# Patient Record
Sex: Male | Born: 1950 | Race: White | Hispanic: Yes | Smoking: Former smoker
Health system: Southern US, Community
[De-identification: ages and names within clinical notes are randomized; demographics above are authoritative.]

## PROBLEM LIST (undated history)

## (undated) DIAGNOSIS — I10 Essential (primary) hypertension: Secondary | ICD-10-CM

## (undated) DIAGNOSIS — M48061 Spinal stenosis, lumbar region without neurogenic claudication: Secondary | ICD-10-CM

## (undated) DIAGNOSIS — M5116 Intervertebral disc disorders with radiculopathy, lumbar region: Secondary | ICD-10-CM

## (undated) DIAGNOSIS — I639 Cerebral infarction, unspecified: Secondary | ICD-10-CM

## (undated) DIAGNOSIS — J449 Chronic obstructive pulmonary disease, unspecified: Secondary | ICD-10-CM

## (undated) DIAGNOSIS — M810 Age-related osteoporosis without current pathological fracture: Secondary | ICD-10-CM

## (undated) DIAGNOSIS — I2510 Atherosclerotic heart disease of native coronary artery without angina pectoris: Secondary | ICD-10-CM

## (undated) DIAGNOSIS — J4489 Other specified chronic obstructive pulmonary disease: Secondary | ICD-10-CM

## (undated) DIAGNOSIS — E785 Hyperlipidemia, unspecified: Secondary | ICD-10-CM

## (undated) HISTORY — DX: Chronic obstructive pulmonary disease, unspecified: J44.9

## (undated) HISTORY — DX: Cerebral infarction, unspecified: I63.9

---

## 1996-06-27 HISTORY — PX: INGUINAL HERNIA REPAIR: SUR1180

## 2003-06-28 HISTORY — PX: CORONARY ARTERY BYPASS GRAFT: SHX141

## 2017-06-27 HISTORY — PX: LUMBAR DISC SURGERY: SHX700

## 2019-06-28 HISTORY — PX: CATARACT EXTRACTION, BILATERAL: SHX1313

## 2019-09-12 ENCOUNTER — Ambulatory Visit: Payer: Medicare (Managed Care) | Attending: Internal Medicine

## 2019-09-12 DIAGNOSIS — Z23 Encounter for immunization: Secondary | ICD-10-CM

## 2019-09-12 NOTE — Progress Notes (Signed)
   Covid-19 Vaccination Clinic  Name:  Joncarlos Atkison    MRN: [id number] DOB: [birth year]  09/12/2019  Mr. Sapp was observed post Covid-19 immunization for 15 minutes without incident. He was provided with Vaccine Information Sheet and instruction to access the V-Safe system.   Mr. Kille was instructed to call 911 with any severe reactions post vaccine: Marland Kitchen Difficulty breathing  . Swelling of face and throat  . A fast heartbeat  . A bad rash all over body  . Dizziness and weakness   Immunizations Administered    Name Date Dose VIS Date Route   Moderna COVID-19 Vaccine 09/12/2019 11:46 AM 0.5 mL 05/28/2019 Intramuscular   Manufacturer: Moderna   Lot: [id number]   NDC: 70964-383-81

## 2019-10-15 ENCOUNTER — Ambulatory Visit: Payer: Medicare (Managed Care) | Attending: Family

## 2019-10-15 DIAGNOSIS — Z23 Encounter for immunization: Secondary | ICD-10-CM

## 2019-10-15 NOTE — Progress Notes (Signed)
   Covid-19 Vaccination Clinic  Name:  Brandon Daniel    MRN: [id number] DOB: 05-18-1951  10/15/2019  [name] was observed post Covid-19 immunization for 15 minutes without incident. He was provided with Vaccine Information Sheet and instruction to access the V-Safe system.   [name] was instructed to call 911 with any severe reactions post vaccine: Marland Kitchen Difficulty breathing  . Swelling of face and throat  . A fast heartbeat  . A bad rash all over body  . Dizziness and weakness   Immunizations Administered    Name Date Dose VIS Date Route   Moderna COVID-19 Vaccine 10/15/2019 11:16 AM 0.5 mL 05/2019 Intramuscular   Manufacturer: Moderna   Lot: [id number]   NDC: 58251-898-42

## 2020-10-20 ENCOUNTER — Observation Stay (HOSPITAL_COMMUNITY)
Admission: EM | Admit: 2020-10-20 | Discharge: 2020-10-21 | Disposition: A | Discharge status: Home / Self Care | Payer: Medicare (Managed Care) | Attending: Internal Medicine | Admitting: Internal Medicine

## 2020-10-20 ENCOUNTER — Observation Stay (HOSPITAL_COMMUNITY): Payer: Medicare (Managed Care)

## 2020-10-20 ENCOUNTER — Emergency Department (HOSPITAL_COMMUNITY): Payer: Medicare (Managed Care)

## 2020-10-20 ENCOUNTER — Encounter (HOSPITAL_COMMUNITY): Payer: Self-pay

## 2020-10-20 ENCOUNTER — Other Ambulatory Visit: Payer: Self-pay

## 2020-10-20 DIAGNOSIS — Z951 Presence of aortocoronary bypass graft: Secondary | ICD-10-CM | POA: Insufficient documentation

## 2020-10-20 DIAGNOSIS — M5116 Intervertebral disc disorders with radiculopathy, lumbar region: Secondary | ICD-10-CM | POA: Insufficient documentation

## 2020-10-20 DIAGNOSIS — Z20822 Contact with and (suspected) exposure to covid-19: Secondary | ICD-10-CM | POA: Insufficient documentation

## 2020-10-20 DIAGNOSIS — I10 Essential (primary) hypertension: Secondary | ICD-10-CM | POA: Insufficient documentation

## 2020-10-20 DIAGNOSIS — Z79899 Other long term (current) drug therapy: Secondary | ICD-10-CM | POA: Diagnosis not present

## 2020-10-20 DIAGNOSIS — Z7982 Long term (current) use of aspirin: Secondary | ICD-10-CM | POA: Diagnosis not present

## 2020-10-20 DIAGNOSIS — M5127 Other intervertebral disc displacement, lumbosacral region: Secondary | ICD-10-CM

## 2020-10-20 DIAGNOSIS — J45909 Unspecified asthma, uncomplicated: Secondary | ICD-10-CM | POA: Insufficient documentation

## 2020-10-20 DIAGNOSIS — Z87891 Personal history of nicotine dependence: Secondary | ICD-10-CM | POA: Diagnosis not present

## 2020-10-20 DIAGNOSIS — R0602 Shortness of breath: Secondary | ICD-10-CM | POA: Diagnosis not present

## 2020-10-20 DIAGNOSIS — R778 Other specified abnormalities of plasma proteins: Secondary | ICD-10-CM

## 2020-10-20 DIAGNOSIS — I25118 Atherosclerotic heart disease of native coronary artery with other forms of angina pectoris: Principal | ICD-10-CM | POA: Insufficient documentation

## 2020-10-20 DIAGNOSIS — R42 Dizziness and giddiness: Secondary | ICD-10-CM | POA: Diagnosis present

## 2020-10-20 DIAGNOSIS — J449 Chronic obstructive pulmonary disease, unspecified: Secondary | ICD-10-CM | POA: Diagnosis not present

## 2020-10-20 DIAGNOSIS — R079 Chest pain, unspecified: Secondary | ICD-10-CM | POA: Diagnosis present

## 2020-10-20 HISTORY — DX: Intervertebral disc disorders with radiculopathy, lumbar region: M51.16

## 2020-10-20 HISTORY — DX: Spinal stenosis, lumbar region without neurogenic claudication: M48.061

## 2020-10-20 HISTORY — DX: Chronic obstructive pulmonary disease, unspecified: J44.9

## 2020-10-20 HISTORY — DX: Other specified chronic obstructive pulmonary disease: J44.89

## 2020-10-20 HISTORY — DX: Essential (primary) hypertension: I10

## 2020-10-20 HISTORY — DX: Atherosclerotic heart disease of native coronary artery without angina pectoris: I25.10

## 2020-10-20 HISTORY — DX: Hyperlipidemia, unspecified: E78.5

## 2020-10-20 HISTORY — DX: Age-related osteoporosis without current pathological fracture: M81.0

## 2020-10-20 LAB — CBC WITH DIFFERENTIAL/PLATELET
Abs Immature Granulocytes: 0.05 10*3/uL (ref 0.00–0.07)
Basophils Absolute: 0.1 10*3/uL (ref 0.0–0.1)
Basophils Relative: 1 %
Eosinophils Absolute: 0.1 10*3/uL (ref 0.0–0.5)
Eosinophils Relative: 3 %
HCT: 37.5 % — ABNORMAL LOW (ref 39.0–52.0)
Hemoglobin: 12.2 g/dL — ABNORMAL LOW (ref 13.0–17.0)
Immature Granulocytes: 1 %
Lymphocytes Relative: 23 %
Lymphs Abs: 1.2 10*3/uL (ref 0.7–4.0)
MCH: 30.4 pg (ref 26.0–34.0)
MCHC: 32.5 g/dL (ref 30.0–36.0)
MCV: 93.5 fL (ref 80.0–100.0)
Monocytes Absolute: 0.6 10*3/uL (ref 0.1–1.0)
Monocytes Relative: 12 %
Neutro Abs: 3.1 10*3/uL (ref 1.7–7.7)
Neutrophils Relative %: 60 %
Platelets: 153 10*3/uL (ref 150–400)
RBC: 4.01 MIL/uL — ABNORMAL LOW (ref 4.22–5.81)
RDW: 12.2 % (ref 11.5–15.5)
WBC: 5.2 10*3/uL (ref 4.0–10.5)
nRBC: 0 % (ref 0.0–0.2)

## 2020-10-20 LAB — BASIC METABOLIC PANEL
Anion gap: 7 (ref 5–15)
BUN: 12 mg/dL (ref 8–23)
CO2: 28 mmol/L (ref 22–32)
Calcium: 9 mg/dL (ref 8.9–10.3)
Chloride: 100 mmol/L (ref 98–111)
Creatinine, Ser: 0.83 mg/dL (ref 0.61–1.24)
GFR, Estimated: >60 mL/min (ref >60)
Glucose, Bld: 131 mg/dL — ABNORMAL HIGH (ref 70–99)
Potassium: 4.1 mmol/L (ref 3.5–5.1)
Sodium: 135 mmol/L (ref 135–145)

## 2020-10-20 LAB — TROPONIN I (HIGH SENSITIVITY)
Troponin I (High Sensitivity): 9 ng/L (ref <18)
Troponin I (High Sensitivity): 17 ng/L (ref <18)
Troponin I (High Sensitivity): 24 ng/L — ABNORMAL HIGH (ref <18)
Troponin I (High Sensitivity): 26 ng/L — ABNORMAL HIGH (ref <18)

## 2020-10-20 LAB — HIV ANTIBODY (ROUTINE TESTING W REFLEX): HIV Screen 4th Generation wRfx: NONREACTIVE

## 2020-10-20 LAB — BRAIN NATRIURETIC PEPTIDE: B Natriuretic Peptide: 137.5 pg/mL — ABNORMAL HIGH (ref 0.0–100.0)

## 2020-10-20 LAB — HEMOGLOBIN A1C
Hgb A1c MFr Bld: 5.5 % (ref 4.8–5.6)
Mean Plasma Glucose: 111.15 mg/dL

## 2020-10-20 LAB — HEPARIN LEVEL (UNFRACTIONATED): Heparin Unfractionated: 0.11 IU/mL — ABNORMAL LOW (ref 0.30–0.70)

## 2020-10-20 LAB — SARS CORONAVIRUS 2 (TAT 6-24 HRS): SARS Coronavirus 2: NEGATIVE

## 2020-10-20 LAB — MAGNESIUM: Magnesium: 2.1 mg/dL (ref 1.7–2.4)

## 2020-10-20 MED ORDER — HYDROCHLOROTHIAZIDE 25 MG PO TABS
25.0000 mg | ORAL_TABLET | Freq: Every day | ORAL | Status: DC
  Administered 2020-10-21: 25 mg via ORAL
  Filled 2020-10-20: qty 1

## 2020-10-20 MED ORDER — ALBUTEROL SULFATE HFA 108 (90 BASE) MCG/ACT IN AERS
1.0000 | INHALATION_SPRAY | Freq: Four times a day (QID) | RESPIRATORY_TRACT | Status: DC | PRN
  Filled 2020-10-20: qty 6.7

## 2020-10-20 MED ORDER — CLOPIDOGREL BISULFATE 75 MG PO TABS
75.0000 mg | ORAL_TABLET | Freq: Every day | ORAL | Status: DC
  Administered 2020-10-21: 75 mg via ORAL
  Filled 2020-10-20: qty 1

## 2020-10-20 MED ORDER — GADOBUTROL 1 MMOL/ML IV SOLN
10.0000 mL | Freq: Once | INTRAVENOUS | Status: AC | PRN
  Administered 2020-10-20: 10 mL via INTRAVENOUS

## 2020-10-20 MED ORDER — ATENOLOL 25 MG PO TABS: 100.0000 mg | ORAL_TABLET | Freq: Every day | ORAL | Status: DC

## 2020-10-20 MED ORDER — HEPARIN BOLUS VIA INFUSION
4000.0000 [IU] | Freq: Once | INTRAVENOUS | Status: AC
  Administered 2020-10-20: 4000 [IU] via INTRAVENOUS
  Filled 2020-10-20: qty 4000

## 2020-10-20 MED ORDER — POLYETHYLENE GLYCOL 3350 17 G PO PACK: 17.0000 g | PACK | Freq: Every day | ORAL | Status: DC | PRN

## 2020-10-20 MED ORDER — NITROGLYCERIN IN D5W 200-5 MCG/ML-% IV SOLN
0.0000 ug/min | INTRAVENOUS | Status: DC
  Administered 2020-10-20: 5 ug/min via INTRAVENOUS
  Filled 2020-10-20: qty 250

## 2020-10-20 MED ORDER — MONTELUKAST SODIUM 10 MG PO TABS
10.0000 mg | ORAL_TABLET | Freq: Every day | ORAL | Status: DC
  Administered 2020-10-20: 10 mg via ORAL
  Filled 2020-10-20 (×2): qty 1

## 2020-10-20 MED ORDER — ASPIRIN EC 81 MG PO TBEC
81.0000 mg | DELAYED_RELEASE_TABLET | Freq: Every day | ORAL | Status: DC
  Administered 2020-10-21: 81 mg via ORAL
  Filled 2020-10-20: qty 1

## 2020-10-20 MED ORDER — EZETIMIBE 10 MG PO TABS
10.0000 mg | ORAL_TABLET | Freq: Every day | ORAL | Status: DC
  Administered 2020-10-21: 10 mg via ORAL
  Filled 2020-10-20: qty 1

## 2020-10-20 MED ORDER — ACETAMINOPHEN 325 MG PO TABS: 650.0000 mg | ORAL_TABLET | Freq: Four times a day (QID) | ORAL | Status: DC | PRN

## 2020-10-20 MED ORDER — HEPARIN BOLUS VIA INFUSION
2000.0000 [IU] | Freq: Once | INTRAVENOUS | Status: AC
  Administered 2020-10-20: 2000 [IU] via INTRAVENOUS
  Filled 2020-10-20: qty 2000

## 2020-10-20 MED ORDER — LISINOPRIL 20 MG PO TABS
20.0000 mg | ORAL_TABLET | Freq: Every day | ORAL | Status: DC
  Administered 2020-10-21: 20 mg via ORAL
  Filled 2020-10-20: qty 1

## 2020-10-20 MED ORDER — GABAPENTIN 300 MG PO CAPS
600.0000 mg | ORAL_CAPSULE | Freq: Two times a day (BID) | ORAL | Status: DC
  Administered 2020-10-20 – 2020-10-21 (×2): 600 mg via ORAL
  Filled 2020-10-20 (×2): qty 2

## 2020-10-20 MED ORDER — ACETAMINOPHEN 650 MG RE SUPP
650.0000 mg | Freq: Four times a day (QID) | RECTAL | Status: DC | PRN
Start: 2020-10-20 — End: 2020-10-21

## 2020-10-20 MED ORDER — HEPARIN (PORCINE) 25000 UT/250ML-% IV SOLN
1500.0000 [IU]/h | INTRAVENOUS | Status: DC
  Administered 2020-10-20 (×2): 1200 [IU]/h via INTRAVENOUS
  Administered 2020-10-21: 1500 [IU]/h via INTRAVENOUS
  Filled 2020-10-20 (×2): qty 250

## 2020-10-20 MED ORDER — ATORVASTATIN CALCIUM 80 MG PO TABS
80.0000 mg | ORAL_TABLET | Freq: Every day | ORAL | Status: DC
  Administered 2020-10-21: 80 mg via ORAL
  Filled 2020-10-20: qty 1

## 2020-10-20 NOTE — ED Notes (Signed)
Patient transported to MRI 

## 2020-10-20 NOTE — ED Notes (Signed)
Patient denies pain and is resting comfortably.  

## 2020-10-20 NOTE — Progress Notes (Signed)
ANTICOAGULATION CONSULT NOTE  Pharmacy Consult for heparin Indication: chest pain/ACS  No Known Allergies  Patient Measurements: Height: 5\' 9"  (175.3 cm) Weight: 104.3 kg (230 lb) IBW/kg (Calculated) : 70.7 Heparin Dosing Weight: 93.2 kg  Vital Signs: Temp: 98.4 F (36.9 C) (04/26 2059) Temp Source: Oral (04/26 2059) BP: 148/65 (04/26 2059) Pulse Rate: 51 (04/26 1700)  Labs: Recent Labs    10/20/20 0924 10/20/20 1057 10/20/20 1220 10/20/20 1416 10/20/20 2211  HGB 12.2*  --   --   --   --   HCT 37.5*  --   --   --   --   PLT 153  --   --   --   --   HEPARINUNFRC  --   --   --   --  0.11*  CREATININE 0.83  --   --   --   --   TROPONINIHS 9 17 24* 26*  --     Estimated Creatinine Clearance: 102.7 mL/min (by C-G formula based on SCr of 0.83 mg/dL).  Assessment: 70 y.o. male with chest pain for heparin   Goal of Therapy:  Heparin level 0.3-0.7 units/ml Monitor platelets by anticoagulation protocol: Yes   Plan:  Heparin 2000 units IV bolus, then increase heparin  1500 units/hr Follow-up am labs.  79, PharmD, BCPS

## 2020-10-20 NOTE — ED Notes (Signed)
Patient transported to CT in stable condition.

## 2020-10-20 NOTE — ED Notes (Addendum)
Nurse report given to floor nurse at this time, Charisse, Charity fundraiser. Patient will go to MRI then bed 6E19.

## 2020-10-20 NOTE — ED Triage Notes (Signed)
Pt stated he was walking yesterday and his legs felt weak and dizzy. Pt stated that he did collapse. He had blurred vision. Today he started having chest pain and dizziness.

## 2020-10-20 NOTE — Consult Note (Signed)
Reason for Consult: Mild chest pain associated with shortness of breath and diaphoresis and minimally elevated high-sensitivity troponin I Referring Physician: Teaching service  Brandon Daniel is an 70 y.o. male.  HPI: Patient is a 70 year old male with past medical history significant for coronary artery disease status post CABG x2 in 2005 in [place] status post 18 stents post bypass as per patient, hypertension, hyperlipidemia, morbid obesity, spinal stenosis, peripheral neuropathy, came to ER earlier because of bilateral leg weakness while walking in [employer] yesterday associated dizziness, did not seek medical attention yesterday but as symptoms got worse so decided to drove to ED.  While walking from parking lot to ED patient developed questionable chest pain and mild shortness of breath and was noted to be diaphoretic EKG done in the ED showed normal sinus rhythm with nondiagnostic Q waves in inferior leads no acute ischemic changes are noted 2 sets of high-sensitivity troponin I were negative and then next 2 sets very minimally elevated.  Patient denies any exertional chest pain although his activity is limited due to his back issues.  Denies palpitation lightheadedness or syncope.  Denies PND orthopnea leg swelling.  States has not used any nitro since last 3 to 4 years.  Had nuclear stress test approximately 3 to 4 years ago in [place] which was negative results not available.  States he had open heart surgery x2 in 2005 subsequently had 18 stents in his heart.  States he is visiting his sister and is planning to go to [place] in the next 2 weeks.  States heart wise he is doing okay he is worried about his back.    History reviewed. No pertinent past medical history.   No family history on file.  Social History:  has no history on file for tobacco use, alcohol use, and drug use.  Allergies: No Known Allergies  Medications: I have reviewed the patient's current  medications.  Results for orders placed or performed during the hospital encounter of 10/20/20 (from the past 48 hour(s))  Brain natriuretic peptide     Status: Abnormal   Collection Time: 10/20/20  9:19 AM  Result Value Ref Range   B Natriuretic Peptide 137.5 (H) 0.0 - 100.0 pg/mL    Comment: Performed at [facility], [address], [place], [place] [address]  Troponin I (High Sensitivity)     Status: None   Collection Time: 10/20/20  9:24 AM  Result Value Ref Range   Troponin I (High Sensitivity) 9 <18 ng/L    Comment: (NOTE) Elevated high sensitivity troponin I (hsTnI) values and significant  changes across serial measurements may suggest ACS but many other  chronic and acute conditions are known to elevate hsTnI results.  Refer to the "Links" section for chest pain algorithms and additional  guidance. Performed at [facility], [address] [place], [place] [address]   Basic metabolic panel     Status: Abnormal   Collection Time: 10/20/20  9:24 AM  Result Value Ref Range   Sodium 135 135 - 145 mmol/L   Potassium 4.1 3.5 - 5.1 mmol/L   Chloride 100 98 - 111 mmol/L   CO2 28 22 - 32 mmol/L   Glucose, Bld 131 (H) 70 - 99 mg/dL    Comment: Glucose reference range applies only to samples taken after fasting for at least 8 hours.   BUN 12 8 - 23 mg/dL   Creatinine, Ser 5.32 0.61 - 1.24 mg/dL   Calcium  9.0 8.9 - 10.3 mg/dL   GFR, Estimated >41 >93 mL/min    Comment: (NOTE) Calculated using the CKD-EPI Creatinine Equation (2021)    Anion gap 7 5 - 15    Comment: Performed at [facility], [address] [place], [place] [address]  CBC with Differential     Status: Abnormal   Collection Time: 10/20/20  9:24 AM  Result Value Ref Range   WBC 5.2 4.0 - 10.5 K/uL   RBC 4.01 (L) 4.22 - 5.81 MIL/uL   Hemoglobin 12.2 (L) 13.0 - 17.0 g/dL   HCT 09.7 (L) 35.3 - 29.9 %   MCV 93.5 80.0 - 100.0 fL   MCH 30.4 26.0 - 34.0 pg   MCHC 32.5 30.0 - 36.0 g/dL   RDW 24.2  68.3 - 41.9 %   Platelets 153 150 - 400 K/uL   nRBC 0.0 0.0 - 0.2 %   Neutrophils Relative % 60 %   Neutro Abs 3.1 1.7 - 7.7 K/uL   Lymphocytes Relative 23 %   Lymphs Abs 1.2 0.7 - 4.0 K/uL   Monocytes Relative 12 %   Monocytes Absolute 0.6 0.1 - 1.0 K/uL   Eosinophils Relative 3 %   Eosinophils Absolute 0.1 0.0 - 0.5 K/uL   Basophils Relative 1 %   Basophils Absolute 0.1 0.0 - 0.1 K/uL   Immature Granulocytes 1 %   Abs Immature Granulocytes 0.05 0.00 - 0.07 K/uL    Comment: Performed at [facility], [address] [place], [place] [address]  Magnesium     Status: None   Collection Time: 10/20/20  9:24 AM  Result Value Ref Range   Magnesium 2.1 1.7 - 2.4 mg/dL    Comment: Performed at [facility], [address], [place], [place] [address]  Troponin I (High Sensitivity)     Status: None   Collection Time: 10/20/20 10:57 AM  Result Value Ref Range   Troponin I (High Sensitivity) 17 <18 ng/L    Comment: (NOTE) Elevated high sensitivity troponin I (hsTnI) values and significant  changes across serial measurements may suggest ACS but many other  chronic and acute conditions are known to elevate hsTnI results.  Refer to the "Links" section for chest pain algorithms and additional  guidance. Performed at [facility], [address] [place], [place] [address]   Troponin I (High Sensitivity)     Status: Abnormal   Collection Time: 10/20/20 12:20 PM  Result Value Ref Range   Troponin I (High Sensitivity) 24 (H) <18 ng/L    Comment: (NOTE) Elevated high sensitivity troponin I (hsTnI) values and significant  changes across serial measurements may suggest ACS but many other  chronic and acute conditions are known to elevate hsTnI results.  Refer to the "Links" section for chest pain algorithms and additional  guidance. Performed at [facility], [address], [place], [place] [address]   Troponin I (High Sensitivity)     Status: Abnormal   Collection  Time: 10/20/20  2:16 PM  Result Value Ref Range   Troponin I (High Sensitivity) 26 (H) <18 ng/L    Comment: (NOTE) Elevated high sensitivity troponin I (hsTnI) values and significant  changes across serial measurements may suggest ACS but many other  chronic and acute conditions are known to elevate hsTnI results.  Refer to the "Links" section for chest pain algorithms and additional  guidance. Performed at [facility], [address],  [place], [place] [address]     CT Head Wo Contrast  Result Date: 10/20/2020 CLINICAL DATA:  Dizziness, nonspecific. Additional history provided: Patient reports leg weakness and dizziness while walking yesterday with collapse, blurred vision, chest pain and dizziness today. EXAM: CT HEAD WITHOUT CONTRAST TECHNIQUE: Contiguous axial images were obtained from the base of the skull through the vertex without intravenous contrast. COMPARISON:  No pertinent prior exams available for comparison. FINDINGS: Brain: Mild generalized parenchymal atrophy. There is no acute intracranial hemorrhage. No demarcated cortical infarct. No extra-axial fluid collection. No evidence of intracranial mass. No midline shift. Vascular: No hyperdense vessel.  Atherosclerotic calcifications Skull: Normal. Negative for fracture or focal lesion. Sinuses/Orbits: Visualized orbits show no acute finding. Mild mucosal thickening within the inferior left frontal sinus. Moderate mucosal thickening within the bilateral ethmoid air cells. Fluid level and moderate mucosal thickening within the left sphenoid sinus with associated chronic reactive osteitis. Extensive partial opacification of the left maxillary sinus with associated chronic reactive osteitis. Mild mucosal thickening and small mucous retention cyst within the imaged right maxillary sinus. Other: 13 mm polypoid soft tissue focus within the posterior right nasal passage. IMPRESSION: No evidence of acute intracranial abnormality. Mild  generalized parenchymal atrophy. Fairly extensive paranasal sinus disease, as described. 13 mm focus of polypoid soft tissue within the posterior right nasal passage. Nonemergent ENT consultation should be considered. Electronically Signed   By: Jackey Loge DO   On: 10/20/2020 10:33   DG Chest Portable 1 View  Result Date: 10/20/2020 CLINICAL DATA:  Shortness of breath and chest pain EXAM: PORTABLE CHEST 1 VIEW COMPARISON:  None. FINDINGS: Blunting of the left costophrenic angle may be due to scarring. A small associated pleural effusion on the left cannot be excluded. Lungs otherwise are somewhat hyperexpanded. There are scattered areas of scarring in the lung bases and left mid lung. No edema or consolidation. Heart size and pulmonary vascularity are within normal limits. Patient is status post median sternotomy. There is aortic atherosclerosis. No bone lesions. IMPRESSION: Scarring lateral left base with questionable small associated pleural effusion on the left. Scattered areas of scarring elsewhere in the bases and left mid lung. No edema or airspace opacity. Underlying mild hyperexpansion. Heart size within normal limits. Postoperative changes noted. Aortic Atherosclerosis (ICD10-I70.0). Electronically Signed   By: Bretta Bang III M.D.   On: 10/20/2020 09:51    Review of Systems  Constitutional: Positive for diaphoresis. Negative for fatigue.  HENT: Negative for sore throat.   Eyes: Negative for pain.  Respiratory: Positive for shortness of breath.   Cardiovascular: Negative for palpitations.  Gastrointestinal: Negative for abdominal distention.  Genitourinary: Negative for difficulty urinating.  Neurological: Negative for dizziness.   Blood pressure 138/74, pulse (!) 51, temperature 98.3 F (36.8 C), temperature source Oral, resp. rate 18, height 5\' 9"  (1.753 m), weight 104.3 kg, SpO2 97 %. Physical Exam Constitutional:      Appearance: He is well-developed.  HENT:     Head:  Normocephalic and atraumatic.  Neck:     Vascular: No JVD.  Cardiovascular:     Rate and Rhythm: Normal rate and regular rhythm.     Heart sounds: Normal heart sounds. No S3 sounds.   Pulmonary:     Comments: Decreased breath sound at bases no rhonchi or rales  noted Abdominal:     General: Bowel sounds are normal. There is no abdominal bruit.     Palpations: Abdomen is soft.  Musculoskeletal:     Cervical back: Normal  range of motion and neck supple.     Right lower leg: No tenderness. No edema.     Left lower leg: No tenderness. No edema.  Skin:    General: Skin is warm and dry.  Neurological:     General: No focal deficit present.     Mental Status: He is alert and oriented to person, place, and time.     Assessment/Plan: Acute coronary syndrome with minimally elevated high-sensitivity troponins I doubt significant MI Coronary artery disease status post CABG x2 approximately 17 years ago status post multivessel PCI requiring 18 stents in [place]. Hypertension Hyperlipidemia Morbid obesity History of spinal stenosis Peripheral neuropathy Plan Continue home medications Check serial enzymes, lipid panel and EKG Check old records Home cardiac meds Aspirin 81 mg 2 tablets daily Clopidogrel 75 mg daily Atenolol 100 mg daily Lisinopril 20 mg daily Hydrochlorothiazide 25 mg daily Atorvastatin 80 mg daily Zetia 10 mg daily Nitrostat 0.4 mg sublingual as needed We will add isosorbide mononitrate 30 mg daily once off IV nitro Discussed with patient at length various options of treatment i.e. repeating nuclear stress test to further risk stratify and possible cardiac catheterization possible PTCA stenting, patient states heart wise he is doing well in fact he did not had any chest pain but was sweating while walking from the parking lot to the ER.  States has not used any nitro in last 3 to 4 years had nuclear stress test 3 to 4 years ago which was normal in [place].   States concerned about his back and planning to travel to [place] on May 7 and wanted to be treated medically for now.  Rinaldo CloudHarwani, Shaniece Bussa 10/20/2020, 5:16 PM

## 2020-10-20 NOTE — Progress Notes (Signed)
ANTICOAGULATION CONSULT NOTE - Initial Consult  Pharmacy Consult for heparin dosing. Indication: chest pain/ACS  Not on File  Patient Measurements: Height: 5\' 9"  (175.3 cm) Weight: 104.3 kg (230 lb) IBW/kg (Calculated) : 70.7 Heparin Dosing Weight: 93.2 kg  Vital Signs: Temp: 98.3 F (36.8 C) (04/26 0909) Temp Source: Oral (04/26 0909) BP: 142/69 (04/26 1400) Pulse Rate: 50 (04/26 1400)  Labs: Recent Labs    10/20/20 0924 10/20/20 1057 10/20/20 1220  HGB 12.2*  --   --   HCT 37.5*  --   --   PLT 153  --   --   CREATININE 0.83  --   --   TROPONINIHS 9 17 24*    Estimated Creatinine Clearance: 102.7 mL/min (by C-G formula based on SCr of 0.83 mg/dL).   Medical History: History reviewed. No pertinent past medical history.  Assessment: 70 y.o. male presenting with chest pain, SOB, and dizziness. Patient reports history of cardiac bypass surgery and multiple stent placements. Reportedly not on AC PTA. Initial CBC ok with Hgb 12.2, HCT 37.5, Pltc 153. Scr 0.83. Pharmacy has been consulted to dose IV heparin.   Goal of Therapy:  Heparin level 0.3-0.7 units/ml Monitor platelets by anticoagulation protocol: Yes   Plan:  Give 4000 units bolus x 1 Start heparin infusion at 1200 units/hr Check anti-Xa level in 6 hours and daily while on heparin Continue to monitor H&H and platelets  79, PharmD PGY1 Acute Care Pharmacy Resident 10/20/2020 2:17 PM  Please check [email or web address] for unit-specific pharmacy phone numbers.

## 2020-10-20 NOTE — ED Provider Notes (Signed)
[facility] EMERGENCY DEPARTMENT Provider Note   CSN: [id number] Arrival date & time: 10/20/20  0857     History Chief Complaint  Brandon Daniel presents with  . Chest Pain  . Dizziness    Brandon Daniel is a 70 y.o. male who presents with concern for episode of dizziness and sensation that Brandon Daniel legs were weak "like I did not have any bones in them" yesterday while Brandon Daniel was at the [employer] store.  Brandon Daniel states that during that time Brandon Daniel had blurry vision.  Brandon Daniel states that Brandon Daniel collapsed to the ground due to the leg weakness but denies any head trauma Kelsy, nausea, vomiting since that time.  Brandon Daniel denies any blurry vision at Brandon time but presented emerged department to undergo evaluation for sensation of leg weakness bilaterally.  Unfortunately when walking from Brandon Daniel vehicle into the emergency department, Brandon Daniel developed chest pain and mild shortness of breath.  At time of my initial exam Brandon Daniel is diaphoretic.  Brandon Daniel does endorse history of cardiac bypass surgery and multiple stent placements since that time.  Procedures done in Brandon Daniel home country after episode of shortness of breath with walking and subsequent failed stress test.  Brandon Daniel denies chest pain at Brandon time though Brandon Daniel remains diaphoretic.  Brandon Daniel reiterates that Brandon Daniel concern is not Brandon Daniel heart as Brandon Daniel "can live with that pain".  Brandon Daniel reiterates that Brandon Daniel priority is to be evaluated for the weakness Brandon Daniel is experiencing in Brandon Daniel legs as Brandon Daniel is scheduled to fly back to the [place] for a visit to Brandon Daniel family in 2 weeks.  Brandon Daniel endorses numbness and tingling in Brandon Daniel first second and third toes on the left foot as well as intermittent numbness and tingling in the left anterior thigh.  Brandon Daniel denies any saddle anesthesia, back pain, urinary incontinence or retention.  I personally reviewed Brandon Daniel's medical records.  Unfortunate there is very little information in Brandon Daniel EMR.  Brandon Daniel is able to provide insight that Brandon Daniel has extensive coronary artery disease  having undergone bypass and multiple stents placed.  Brandon Daniel has a medication list with him.  Brandon Daniel takes the following medications: Alendronate, 70 mg, Nitrostat as needed, atenolol 100 mg, aspirin 81 mg (takes 2 tablets daily), clopidogrel, acetamide, HCTZ, atorvastatin, gabapentin, lisinopril 20 mg, Stiolto (COPD).  Additionally Brandon Daniel endorses having history of lumbar spinal stenosis having undergone surgery.  Brandon Daniel states that the numbness, tingling, and weakness Brandon Daniel is experiencing was similar to how Brandon Daniel presented prior to Brandon Daniel prior back surgery.  Of note Brandon Daniel is visiting Brandon Daniel sister; Brandon Daniel lives in [place] and all of Brandon Daniel medical care is there.  HPI     History reviewed. No pertinent past medical history.  There are no problems to display for Brandon Daniel.  No family history on file.     Home Medications Prior to Admission medications   Medication Sig Start Date End Date Taking? Authorizing Provider  albuterol (VENTOLIN HFA) 108 (90 Base) MCG/ACT inhaler Inhale 1 puff into the lungs every 6 (six) hours as needed for wheezing or shortness of breath.   Yes [provider]  alendronate (FOSAMAX) 70 MG tablet Take 70 mg by mouth once a week. Take with a full glass of water on an empty stomach.   Yes [provider]  aspirin EC 81 MG tablet Take 81 mg by mouth in the morning and at bedtime. Swallow whole.   Yes [provider]  atenolol (TENORMIN) 100 MG tablet Take 100 mg by mouth  daily.   Yes [provider]  atorvastatin (LIPITOR) 80 MG tablet Take 80 mg by mouth daily.   Yes [provider]  clopidogrel (PLAVIX) 75 MG tablet Take 75 mg by mouth daily.   Yes [provider]  ezetimibe (ZETIA) 10 MG tablet Take 10 mg by mouth daily.   Yes [provider]  gabapentin (NEURONTIN) 600 MG tablet Take 600 mg by mouth 2 (two) times daily.   Yes [provider]  hydrochlorothiazide (HYDRODIURIL) 25 MG tablet Take 25 mg by mouth  daily.   Yes [provider]  lisinopril (ZESTRIL) 20 MG tablet Take 20 mg by mouth daily.   Yes [provider]  montelukast (SINGULAIR) 10 MG tablet Take 10 mg by mouth at bedtime.   Yes [provider]  nitroGLYCERIN (NITROSTAT) 0.4 MG SL tablet Place 0.4 mg under the tongue every 5 (five) minutes as needed for chest pain.   Yes [provider]  Polyvinyl Alcohol-Povidone (REFRESH OP) Place 1 drop into both eyes daily.   Yes [provider]  Tiotropium Bromide-Olodaterol (STIOLTO RESPIMAT) 2.5-2.5 MCG/ACT AERS Inhale 2 puffs into the lungs daily.   Yes [provider]    Allergies    Brandon Daniel has no known allergies.  Review of Systems   Review of Systems  Constitutional: Positive for diaphoresis. Negative for activity change, appetite change, chills, fatigue, fever and unexpected weight change.  HENT: Negative.   Eyes: Positive for visual disturbance.  Respiratory: Positive for shortness of breath. Negative for chest tightness.   Cardiovascular: Positive for chest pain. Negative for palpitations and leg swelling.  Gastrointestinal: Negative.   Genitourinary: Negative.   Musculoskeletal: Negative for back pain, neck pain and neck stiffness.  Skin: Negative.   Neurological: Positive for weakness and numbness. Negative for dizziness, syncope, facial asymmetry and headaches.    Physical Exam Updated Vital Signs BP (!) 167/74   Pulse (!) 38   Temp 98.3 F (36.8 C) (Oral)   Resp (!) 22   Ht 5\' 9"  (1.753 m)   Wt 104.3 kg   SpO2 100%   BMI 33.97 kg/m   Physical Exam Vitals and nursing note reviewed.  Constitutional:      Appearance: Brandon Daniel is obese. Brandon Daniel is diaphoretic. Brandon Daniel is not ill-appearing or toxic-appearing.  HENT:     Head: Normocephalic and atraumatic.     Nose: Nose normal.     Mouth/Throat:     Mouth: Mucous membranes are moist.     Pharynx: Oropharynx is clear. Uvula midline. No oropharyngeal exudate, posterior  oropharyngeal erythema or uvula swelling.     Tonsils: No tonsillar exudate.  Eyes:     General: Lids are normal. Vision grossly intact.        Right eye: No discharge.        Left eye: No discharge.     Extraocular Movements: Extraocular movements intact.     Conjunctiva/sclera: Conjunctivae normal.     Pupils: Pupils are equal, round, and reactive to light.  Neck:     Trachea: Trachea and phonation normal.  Cardiovascular:     Rate and Rhythm: Normal rate and regular rhythm.     Pulses: Normal pulses.     Heart sounds: Normal heart sounds. No murmur heard.   Pulmonary:     Effort: Pulmonary effort is normal. No tachypnea, bradypnea, accessory muscle usage or respiratory distress.     Breath sounds: Examination of the right-lower field reveals decreased breath sounds. Examination of  the left-lower field reveals decreased breath sounds. Decreased breath sounds present. No wheezing or rales.  Chest:     Chest wall: No mass, lacerations, deformity, swelling, tenderness, crepitus or edema.  Abdominal:     General: Abdomen is protuberant. Bowel sounds are normal. There is no distension.     Palpations: Abdomen is soft.     Tenderness: There is no abdominal tenderness. There is no right CVA tenderness, left CVA tenderness, guarding or rebound.     Hernia: A hernia is present. Hernia is present in the umbilical area.  Musculoskeletal:        General: No deformity.     Cervical back: Normal range of motion and neck supple. No edema, rigidity, tenderness, bony tenderness or crepitus. No pain with movement, spinous process tenderness or muscular tenderness.     Thoracic back: No tenderness or bony tenderness.     Lumbar back: No tenderness or bony tenderness.     Right lower leg: No edema.     Left lower leg: No edema.  Lymphadenopathy:     Cervical: No cervical adenopathy.  Skin:    General: Skin is warm.  Neurological:     Mental Status: Brandon Daniel is alert and oriented to person, place, and  time. Mental status is at baseline.     Cranial Nerves: Cranial nerves are intact.     Sensory: Sensation is intact.     Motor: Motor function is intact.     Coordination: Coordination is intact.     Gait: Gait is intact.  Psychiatric:        Mood and Affect: Mood normal.     ED Results / Procedures / Treatments   Labs (all labs ordered are listed, but only abnormal results are displayed) Labs Reviewed  BASIC METABOLIC PANEL - Abnormal; Notable for the following components:      Result Value   Glucose, Bld 131 (*)    All other components within normal limits  CBC WITH DIFFERENTIAL/PLATELET - Abnormal; Notable for the following components:   RBC 4.01 (*)    Hemoglobin 12.2 (*)    HCT 37.5 (*)    All other components within normal limits  BRAIN NATRIURETIC PEPTIDE - Abnormal; Notable for the following components:   B Natriuretic Peptide 137.5 (*)    All other components within normal limits  TROPONIN I (HIGH SENSITIVITY) - Abnormal; Notable for the following components:   Troponin I (High Sensitivity) 24 (*)    All other components within normal limits  MAGNESIUM  HEPARIN LEVEL (UNFRACTIONATED)  TROPONIN I (HIGH SENSITIVITY)  TROPONIN I (HIGH SENSITIVITY)  TROPONIN I (HIGH SENSITIVITY)    EKG EKG Interpretation  Date/Time:  Tuesday October 20 2020 09:10:43 EDT Ventricular Rate:  71 PR Interval:  155 QRS Duration: 101 QT Interval:  389 QTC Calculation: 423 R Axis:   65 Text Interpretation: Sinus rhythm nonspecific lateral T waves No old tracing to compare Confirmed by Pricilla LovelessGoldston, Scott [phone]([id number]) on 10/20/2020 9:14:01 AM   Radiology CT Head Wo Contrast  Result Date: 10/20/2020 CLINICAL DATA:  Dizziness, nonspecific. Additional history provided: Brandon Daniel reports leg weakness and dizziness while walking yesterday with collapse, blurred vision, chest pain and dizziness today. EXAM: CT HEAD WITHOUT CONTRAST TECHNIQUE: Contiguous axial images were obtained from the base of the  skull through the vertex without intravenous contrast. COMPARISON:  No pertinent prior exams available for comparison. FINDINGS: Brain: Mild generalized parenchymal atrophy. There is no acute intracranial hemorrhage. No demarcated cortical infarct. No extra-axial  fluid collection. No evidence of intracranial mass. No midline shift. Vascular: No hyperdense vessel.  Atherosclerotic calcifications Skull: Normal. Negative for fracture or focal lesion. Sinuses/Orbits: Visualized orbits show no acute finding. Mild mucosal thickening within the inferior left frontal sinus. Moderate mucosal thickening within the bilateral ethmoid air cells. Fluid level and moderate mucosal thickening within the left sphenoid sinus with associated chronic reactive osteitis. Extensive partial opacification of the left maxillary sinus with associated chronic reactive osteitis. Mild mucosal thickening and small mucous retention cyst within the imaged right maxillary sinus. Other: 13 mm polypoid soft tissue focus within the posterior right nasal passage. IMPRESSION: No evidence of acute intracranial abnormality. Mild generalized parenchymal atrophy. Fairly extensive paranasal sinus disease, as described. 13 mm focus of polypoid soft tissue within the posterior right nasal passage. Nonemergent ENT consultation should be considered. Electronically Signed   By: Jackey Loge DO   On: 10/20/2020 10:33   DG Chest Portable 1 View  Result Date: 10/20/2020 CLINICAL DATA:  Shortness of breath and chest pain EXAM: PORTABLE CHEST 1 VIEW COMPARISON:  None. FINDINGS: Blunting of the left costophrenic angle may be due to scarring. A small associated pleural effusion on the left cannot be excluded. Lungs otherwise are somewhat hyperexpanded. There are scattered areas of scarring in the lung bases and left mid lung. No edema or consolidation. Heart size and pulmonary vascularity are within normal limits. Brandon Daniel is status post median sternotomy. There is  aortic atherosclerosis. No bone lesions. IMPRESSION: Scarring lateral left base with questionable small associated pleural effusion on the left. Scattered areas of scarring elsewhere in the bases and left mid lung. No edema or airspace opacity. Underlying mild hyperexpansion. Heart size within normal limits. Postoperative changes noted. Aortic Atherosclerosis (ICD10-I70.0). Electronically Signed   By: Bretta Bang III M.D.   On: 10/20/2020 09:51    Procedures .Critical Care Performed by: Paris Lore, PA-C Authorized by: Paris Lore, PA-C   Critical care provider statement:    Critical care time (minutes):  45   Critical care was necessary to treat or prevent imminent or life-threatening deterioration of the following conditions: ACS.   Critical care was time spent personally by me on the following activities:  Discussions with consultants, evaluation of Brandon Daniel's response to treatment, examination of Brandon Daniel, ordering and performing treatments and interventions, ordering and review of laboratory studies, ordering and review of radiographic studies, pulse oximetry, re-evaluation of Brandon Daniel's condition, obtaining history from Brandon Daniel or surrogate and review of old charts    Medications Ordered in ED Medications  nitroGLYCERIN 50 mg in dextrose 5 % 250 mL (0.2 mg/mL) infusion (5 mcg/min Intravenous New Bag/Given 10/20/20 1420)  heparin ADULT infusion 100 units/mL (25000 units/271mL) (1,200 Units/hr Intravenous New Bag/Given 10/20/20 1430)    And  heparin bolus via infusion 4,000 Units (4,000 Units Intravenous Bolus from Bag 10/20/20 1430)    ED Course  I have reviewed the triage vital signs and the nursing notes.  Pertinent labs & imaging results that were available during my care of the Brandon Daniel were reviewed by me and considered in my medical decision making (see chart for details).  Clinical Course as of 10/20/20 1528  Tue Oct 20, 2020  1401 Consult to cardiologist,  Dr. Sharyn Lull for upward trending troponins.  Brandon Daniel recommends starting IV nitro and IV heparin, and medical admission to the hospital.  I appreciate Brandon Daniel collaboration in the care of Brandon Daniel.  Brandon Daniel will continue to consult on the inpatient service. [RS]  1425  Consult to internal medicine service, Dr. Huel Cote, was agreeable to seeing Brandon Daniel and admitting him to her service.  I appreciate her collaboration in the care Brandon Daniel. [RS]    Clinical Course User Index [RS] Jerson Furukawa, Idelia Salm   MDM Rules/Calculators/A&P                          70 year old male presents with concern for bilateral lower extremity weakness and episode of dizziness with blurry vision yesterday.  Additionally episode of chest pain and shortness of breath when ambulating into the emergency department, diaphoretic at Brandon time.  Concern for ACS given extensive cardiac history.  Differential diagnosis also includes COPD, pleural effusion, pneumonia, PE, pulmonary hypertension.  In regards to Brandon Daniel's low back pain, the emergent differential diagnosis for low back pain includes acute ligamentous and muscular injury, cord compression syndrome, pathologic fracture, transverse myelitis, vertebral osteomyelitis, discitis, and epidural abscess.  Hypertensive on intake.  Vital signs are normal.  Brandon Daniel is diaphoretic at time of my initial exam.  Cardiac exam is unremarkable, pulmonary exam with decreased breath sounds on lung bases bilaterally.  Abdominal exam revealed umbilical hernia, without evidence of incarceration.  Neurologic exam is without focal deficit.  There is symmetric strength in the lower extremities bilaterally.  Normal coordination.  There is altered sensation in the first through third digits of the left foot, however Brandon Daniel states that Brandon is persistent since Brandon Daniel prior back surgery.  Will prioritize cardiac work-up as well as CT of the head given episode of dizziness yesterday.  No indication for  emergent MRI as Brandon Daniel has normal neurologic exam and is ambulatory without difficulty in the emergency department.  Consult to cardiologist recommends starting IV heparin IV nitro; Brandon Daniel recommends medical admission.  Internal medicine consulted and will accept Brandon Daniel to their service.  Gerad voiced understanding of Brandon Daniel medical evaluation and treatment plan.  Issues questions was answered to Brandon Daniel expressed satisfaction.  Brandon Daniel is amenable to plan for admission at Brandon time.  Brandon chart was dictated using voice recognition software, Dragon. Despite the best efforts of Brandon provider to proofread and correct errors, errors may still occur which can change documentation meaning.  Final Clinical Impression(s) / ED Diagnoses Final diagnoses:  None    Rx / DC Orders ED Discharge Orders    None       Sherrilee Gilles 10/20/20 1528    Pricilla Loveless, MD 10/21/20 [phone]

## 2020-10-20 NOTE — H&P (Addendum)
Date: 10/20/2020               Patient Name:  Brandon Daniel MRN: [id number]  DOB: October 13, 1950 Age / Sex: 70 y.o., male   PCP: Pcp, No         Medical Service: Internal Medicine Teaching Service         Attending Physician: Dr. Reymundo Poll, MD    First Contact: Dr. Imogene Burn Pager: [phone]  Second Contact: Dr. Huel Cote Pager: [phone]       After Hours (After 5p/  First Contact Pager: [phone]  weekends / holidays): Second Contact Pager: [phone]   Chief Complaint: Left leg weakness, chest pain  History of Present Illness:   Brandon Daniel is a 70 y.o. male with hx of CAD s/p CABG in 2005 and multiple stents, stable angina, hypertension, hyperlipidemia, osteoporosis, lumbar stenosis and disc herniation with radiculopathy status post endoscopic discectomy, asthma COPD overlap syndrome presenting for left leg weakness. Brandon Daniel states he has been experiencing left leg weakness and areas of numbness for some time and it has gradually worsened. Had discectomy in 2019 for disc herniation with radiculopathy. He had lumbar spinal stenosis repair approximately 3 years ago. Yesterday, he was in [employer] yesterday when his legs became significantly weaker. He became dizzy with blurry vision and fell due to this weakness. He denies LOC or head trauma. Since then has had persistent though somewhat improved weakness in the left leg, right leg weakness has resolved. He has been experiencing continued tingling and numbness in his left leg that has also been present since his spinal stenosis surgery 3 years ago, but it now involves the second and third digits. He denies any saddle anesthesia, urinary or bowel incontinence.  Denies any back pain at this time.  He came to the ED to be evaluated and when walking in, he developed chest pain, SOB and started sweating. He had a CABG in 2015 and states he has 18 stents. Chest pain self-resolved and has not re-occurred. SOB resolved as well.   He denies  any fever, chills, nausea, vomiting, abdominal pain, diarrhea, urinary symptoms.    ED Course: Cardiac work-up was reassuring with reassuring EKG, low troponins. 9 > 17 > 24 > 26.  There is still concern for ACS given his extensive history, cardiology consulted and recommending starting IV heparin and IV nitro.  Current Outpatient Medications  Medication Instructions  . albuterol (VENTOLIN HFA) 108 (90 Base) MCG/ACT inhaler 1 puff, Inhalation, Every 6 hours PRN  . alendronate (FOSAMAX) 70 mg, Oral, Weekly, Take with a full glass of water on an empty stomach.  Marland Kitchen aspirin EC 81 mg, Oral, 2 times daily, Swallow whole.  Marland Kitchen atenolol (TENORMIN) 100 mg, Oral, Daily  . atorvastatin (LIPITOR) 80 mg, Oral, Daily  . clopidogrel (PLAVIX) 75 mg, Oral, Daily  . ezetimibe (ZETIA) 10 mg, Oral, Daily  . gabapentin (NEURONTIN) 600 mg, Oral, 2 times daily  . hydrochlorothiazide (HYDRODIURIL) 25 mg, Oral, Daily  . lisinopril (ZESTRIL) 20 mg, Oral, Daily  . montelukast (SINGULAIR) 10 mg, Oral, Daily at bedtime  . nitroGLYCERIN (NITROSTAT) 0.4 mg, Sublingual, Every 5 min PRN  . Polyvinyl Alcohol-Povidone (REFRESH OP) 1 drop, Both Eyes, Daily  . Tiotropium Bromide-Olodaterol (STIOLTO RESPIMAT) 2.5-2.5 MCG/ACT AERS 2 puffs, Inhalation, Daily    Allergies as of 10/20/2020  . (No Known Allergies)    Past Medical History:  Diagnosis Date  . Asthma-COPD overlap syndrome (HCC)   . CAD (coronary artery disease)   .  HLD (hyperlipidemia)   . HTN (hypertension)   . Lumbar disc herniation with radiculopathy   . Lumbar stenosis   . Osteoporosis     Past Surgical History:  Procedure Laterality Date  . CATARACT EXTRACTION, BILATERAL  2021  . CORONARY ARTERY BYPASS GRAFT  2005  . INGUINAL HERNIA REPAIR  1998  . LUMBAR DISC SURGERY  2019    Family History  Problem Relation Age of Onset  . Cancer Mother   . CAD Father   . Diabetes Other   . Colon cancer Other   . Prostate cancer Other     Social  History   Tobacco Use  . Smoking status: Former Smoker    Packs/day: 2.00    Years: 30.00    Pack years: 60.00    Types: Cigarettes  . Smokeless tobacco: Never Used  Substance Use Topics  . Alcohol use: Never  . Drug use: Never   Review of Systems: Review of Systems  Constitutional: Negative for chills and fever.  HENT: Negative for congestion and sore throat.   Eyes: Positive for blurred vision. Negative for pain.  Respiratory: Positive for shortness of breath. Negative for cough.   Cardiovascular: Positive for chest pain. Negative for leg swelling.  Gastrointestinal: Negative for abdominal pain, diarrhea, nausea and vomiting.  Genitourinary: Negative for dysuria and frequency.  Musculoskeletal: Negative for back pain.  Neurological: Positive for dizziness, sensory change and focal weakness. Negative for speech change, loss of consciousness, weakness and headaches.  All other systems reviewed and are negative.    Physical Exam: Blood pressure 138/74, pulse (!) 51, temperature 98.3 F (36.8 C), temperature source Oral, resp. rate 18, height 5\' 9"  (1.753 m), weight 104.3 kg, SpO2 97 %. Physical Exam Vitals and nursing note reviewed.  Constitutional:      Appearance: He is well-developed.  HENT:     Head: Normocephalic and atraumatic.  Eyes:     Extraocular Movements: Extraocular movements intact.     Comments: Pupils asymmetric, reports present since prior cataract surgery Mild right ptosis, reports present for many years  Cardiovascular:     Rate and Rhythm: Normal rate and regular rhythm.     Pulses:          Radial pulses are 2+ on the right side and 2+ on the left side.       Dorsalis pedis pulses are 2+ on the right side and 2+ on the left side.     Heart sounds: Normal heart sounds. Heart sounds not distant. No murmur heard. No friction rub.  Pulmonary:     Effort: Pulmonary effort is normal. No tachypnea.     Breath sounds: No decreased breath sounds, wheezing,  rhonchi or rales.  Chest:     Chest wall: No tenderness or edema.  Abdominal:     Palpations: Abdomen is soft.     Comments: Small soft umbilical hernia, nontender, easily reducible  Musculoskeletal:     Cervical back: Neck supple.     Right lower leg: No edema.     Left lower leg: No edema.  Skin:    General: Skin is warm and dry.  Neurological:     Mental Status: He is alert and oriented to person, place, and time.     Comments: Cranial nerves intact besides asymmetric pupils and eye ptosis which have been chronic Left lower extremity strength 4/5 on hip flexion and foot plantar- and dorsi-flexion Lumbar pain elicited with opposition of LLE flexion Sensation decreased over  medial left lower leg and medial portion of foot Strength 5 out of 5 elsewhere Unable to elicit patellar reflex bilaterally Achilles reflex 1+ bilaterally  Psychiatric:        Mood and Affect: Mood normal.        Behavior: Behavior normal.      EKG: personally reviewed my interpretation is normal sinus rhythm, no ST changes, T wave inversion in AvL, no prior for comparison  BNP: 137.5 Troponin: 9 > 17 > 24  CBC    Component Value Date/Time   WBC 5.2 10/20/2020 0924   RBC 4.01 (L) 10/20/2020 0924   HGB 12.2 (L) 10/20/2020 0924   HCT 37.5 (L) 10/20/2020 0924   PLT 153 10/20/2020 0924   MCV 93.5 10/20/2020 0924   MCH 30.4 10/20/2020 0924   MCHC 32.5 10/20/2020 0924   RDW 12.2 10/20/2020 0924   LYMPHSABS 1.2 10/20/2020 0924   MONOABS 0.6 10/20/2020 0924   EOSABS 0.1 10/20/2020 0924   BASOSABS 0.1 10/20/2020 0924    BMET    Component Value Date/Time   NA 135 10/20/2020 0924   K 4.1 10/20/2020 0924   CL 100 10/20/2020 0924   CO2 28 10/20/2020 0924   GLUCOSE 131 (H) 10/20/2020 0924   BUN 12 10/20/2020 0924   CREATININE 0.83 10/20/2020 0924   CALCIUM 9.0 10/20/2020 0924   GFRNONAA >60 10/20/2020 0924     CXR: personally reviewed my interpretation is opacity of left lower lobe without  silhouette sign, small pleural effusion on left, concerning for scarring  CT Head Wo Contrast  Result Date: 10/20/2020 CLINICAL DATA:  Dizziness, nonspecific. Additional history provided: Patient reports leg weakness and dizziness while walking yesterday with collapse, blurred vision, chest pain and dizziness today. EXAM: CT HEAD WITHOUT CONTRAST TECHNIQUE: Contiguous axial images were obtained from the base of the skull through the vertex without intravenous contrast. COMPARISON:  No pertinent prior exams available for comparison. FINDINGS: Brain: Mild generalized parenchymal atrophy. There is no acute intracranial hemorrhage. No demarcated cortical infarct. No extra-axial fluid collection. No evidence of intracranial mass. No midline shift. Vascular: No hyperdense vessel.  Atherosclerotic calcifications Skull: Normal. Negative for fracture or focal lesion. Sinuses/Orbits: Visualized orbits show no acute finding. Mild mucosal thickening within the inferior left frontal sinus. Moderate mucosal thickening within the bilateral ethmoid air cells. Fluid level and moderate mucosal thickening within the left sphenoid sinus with associated chronic reactive osteitis. Extensive partial opacification of the left maxillary sinus with associated chronic reactive osteitis. Mild mucosal thickening and small mucous retention cyst within the imaged right maxillary sinus. Other: 13 mm polypoid soft tissue focus within the posterior right nasal passage. IMPRESSION: No evidence of acute intracranial abnormality. Mild generalized parenchymal atrophy. Fairly extensive paranasal sinus disease, as described. 13 mm focus of polypoid soft tissue within the posterior right nasal passage. Nonemergent ENT consultation should be considered. Electronically Signed   By: Jackey Loge DO   On: 10/20/2020 10:33    Assessment & Plan by Problem: Active Problems:   Chest pain   Brandon Daniel is a 70 y.o. male with hx of CAD s/p CABG and  multiple stents, stable angina, HTN, HLD, osteoporosis, lumbar stenosis and disc herniation, asthma/COPD overlap syndrome presenting for left leg weakness and also developed chest pain. Chest pain has resolved.   # Chest Pain, ACS vs stable angina # History of CAD s/p CABG # HLD Presenting with chest pain during walk into the ED for separate issue, pain resolved spontaneously. Hx  of stable angina and prescribed nitroglycerin which he has never used. Patient had CABG in April 2005 for severe three-vessel disease in [place].  Repeat cath in September 2005 demonstrated failure of his grafts, had DES placed and LAD, PDA, 2 branches of RCA, OM, left main.  Repeat cath in January 2015 showed reocclusion of PDA and SVG to LAD which was stented.  He is on dual antiplatelet therapy. Denies missing any doses of medications. EKG reassuring. Troponin 9 > 17 > 24 > 26. BNP 137 but no hx of heart failure and no clinical signs of such.   -Cardiology consulted, appreciate recommendations.  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  MRI in September 2019 demonstrated stenosis, herniated disks, and neuroforaminal stenosis of varying degrees throughout the lumbar spine.  Among these there is severe stenosis at the left L5-S1 neuroforamina with thickening and increased enhancement of the left S1 nerve root.  Had discectomy in in September.  Repeat MRI in December 2019 and still demonstrated enhancement of the left S1 nerve root. Treated since then with  epidural steroid injection. He has had left leg weakness for many years, as well as numbness of the left, 1 part of the foot since the surgery 2019.  Presenting with worsening weakness and numbness has spread to include the second and third digits.  No saddle anesthesia, incontinence.  No focal neurodeficits outside of the left lower extremity.  Cranial nerves are intact.  Noted to have asymmetric pupils, but states this has been like that since prior cataract surgery.  Right eye ptosis has been present for decades.  - Follow-up lumbar MRI - Sequential neuro exams  # Hypertension Home meds include atenolol 100 mg, HCTZ 25 mg, lisinopril 20 mg.  Took his medications this morning prior to arrival.  Blood pressure 148/69 on admission.  -Resume home meds in the morning  # Asthma/COPD overlap syndrome Home meds of Stiolto Respimat, Singulair, albuterol.  No signs of current flare, no wheezing on exam. - Continue home Singulair and albuterol  # Osteoporosis Home meds of alendronate 70 mg weekly on Sundays. - Continue home meds  Dispo: Admit patient to Observation with expected length of stay less than 2 midnights.  Signed: Remo Lippshen, Stclair Szymborski Y, MD 10/20/2020, 5:54 PM  Pager: [phone]  After 5pm on weekdays and 1pm on weekends: On Call pager: [phone]

## 2020-10-21 DIAGNOSIS — R079 Chest pain, unspecified: Secondary | ICD-10-CM | POA: Diagnosis not present

## 2020-10-21 DIAGNOSIS — I25118 Atherosclerotic heart disease of native coronary artery with other forms of angina pectoris: Secondary | ICD-10-CM | POA: Diagnosis not present

## 2020-10-21 DIAGNOSIS — M5127 Other intervertebral disc displacement, lumbosacral region: Secondary | ICD-10-CM | POA: Diagnosis not present

## 2020-10-21 LAB — CBC
HCT: 36.7 % — ABNORMAL LOW (ref 39.0–52.0)
Hemoglobin: 11.8 g/dL — ABNORMAL LOW (ref 13.0–17.0)
MCH: 30.3 pg (ref 26.0–34.0)
MCHC: 32.2 g/dL (ref 30.0–36.0)
MCV: 94.1 fL (ref 80.0–100.0)
Platelets: 134 10*3/uL — ABNORMAL LOW (ref 150–400)
RBC: 3.9 MIL/uL — ABNORMAL LOW (ref 4.22–5.81)
RDW: 12.3 % (ref 11.5–15.5)
WBC: 5.4 10*3/uL (ref 4.0–10.5)
nRBC: 0 % (ref 0.0–0.2)

## 2020-10-21 LAB — BASIC METABOLIC PANEL
Anion gap: 11 (ref 5–15)
BUN: 10 mg/dL (ref 8–23)
CO2: 29 mmol/L (ref 22–32)
Calcium: 8.9 mg/dL (ref 8.9–10.3)
Chloride: 99 mmol/L (ref 98–111)
Creatinine, Ser: 0.75 mg/dL (ref 0.61–1.24)
GFR, Estimated: >60 mL/min (ref >60)
Glucose, Bld: 122 mg/dL — ABNORMAL HIGH (ref 70–99)
Potassium: 3.8 mmol/L (ref 3.5–5.1)
Sodium: 139 mmol/L (ref 135–145)

## 2020-10-21 LAB — TROPONIN I (HIGH SENSITIVITY): Troponin I (High Sensitivity): 13 ng/L (ref <18)

## 2020-10-21 MED ORDER — ENOXAPARIN SODIUM 40 MG/0.4ML ~~LOC~~ SOLN: 40.0000 mg | SUBCUTANEOUS | Status: DC

## 2020-10-21 MED ORDER — ARFORMOTEROL TARTRATE 15 MCG/2ML IN NEBU
15.0000 ug | INHALATION_SOLUTION | Freq: Two times a day (BID) | RESPIRATORY_TRACT | Status: DC
  Administered 2020-10-21: 15 ug via RESPIRATORY_TRACT
  Filled 2020-10-21: qty 2

## 2020-10-21 MED ORDER — UMECLIDINIUM BROMIDE 62.5 MCG/INH IN AEPB
1.0000 | INHALATION_SPRAY | Freq: Every day | RESPIRATORY_TRACT | Status: DC
  Administered 2020-10-21: 1 via RESPIRATORY_TRACT
  Filled 2020-10-21: qty 7

## 2020-10-21 NOTE — Progress Notes (Signed)
   Subjective:   Patient states he feels well. No pain at this time. His left leg still feels weak and numb, no change from yesterday.   When asked about exercise tolerance, patient states he does not get much activity because he is "lazy," not because he gets short of breath. But does state he was walking up a hill last week and was winded at the end. Unable to tell us how far he can typically walk before he gets out of breath, but states he walked 2 blocks while coming to the hospital yesterday and felt short of breath and diaphoretic.  Objective:  Vital signs in last 24 hours: Vitals:   10/21/20 0338 10/21/20 1028 10/21/20 1101 10/21/20 1219  BP: 134/63 (!) 143/67  (!) 122/55  Pulse:  (!) 58  69  Resp: 16   16  Temp: 98.4 F (36.9 C)   98.3 F (36.8 C)  TempSrc: Oral   Oral  SpO2: 95%  96% 96%  Weight:      Height:        Physical Exam Constitutional: no acute distress Head: atraumatic ENT: external ears normal Eyes: EOMI, pupils asymmetric and ptosis on right which are chronic Cardiovascular: regular rate and rhythm, normal heart sounds Pulmonary: effort normal, lungs clear to ascultation bilaterally Abdominal: flat, nontender, no rebound tenderness, bowel sounds normal Skin: warm and dry Neurological: alert, no focal deficit, LLE strength 4/5, RLE 5/5, decreased light touch and vibratory sensation on medial part of L foot, patellar reflexes could not be elicited Psychiatric: normal mood and affect  Assessment/Plan: Brandon Daniel is a 70 y.o. male with hx of CAD s/p CABG and multiple stents, stable angina, HTN, HLD, osteoporosis, lumbar stenosis and disc herniation, asthma/COPD overlap syndrome presenting for left leg weakness and also developed chest pain. Chest pain has resolved. Cardiology discussed with patient and he opts for medical treatment only. They recommended discontinuing heparin and are okay with discharge.  Active Problems:   Chest pain   # ACS vs stable  angina # History of CAD s/p CABG # HLD  Troponin 9 > 17 > 24 > 26 > 13. Cardiology following, treated as ACS yesterday with heparin drip, now considering as possible stable angina. Extensive CAD history with prior CABG and stents. Cardiology discussed interventional options but patient opts for medical management only.   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 MRI this admission shows multilevel disease including L foraminal disc protrusion at L5-S1, impinging upon the transiting left L5 nerve root.  - Neurosurgery consulted, possibly elective surgery in the future. Not this admission given ACS as above. Follow up outpatient  # Hypertension -Resume home meds  # Asthma/COPD overlap syndrome - Continue home meds   Diet:  Heart healthy IVF:  none VTE:  Heparin -> lovenox Prior to Admission Living Arrangement:  home Anticipated Discharge Location:  home Dispo: Anticipated discharge later today.   Remo Lipps, MD 10/21/2020, 1:05 PM Pager: [phone] After 5pm on weekdays and 1pm on weekends: On Call pager [phone]

## 2020-10-21 NOTE — Discharge Summary (Signed)
Name: Brandon Daniel MRN: [id number] DOB: 19-Apr-1951 70 y.o. PCP: Pcp, No  Date of Admission: 10/20/2020  8:59 AM Date of Discharge:  10/21/20 Attending Physician: Reymundo Poll, MD   Discharge Diagnosis: 1. Stable angina 2. Lumbar disc herniation with radiculopathy  Discharge Medications: Allergies as of 10/21/2020   No Known Allergies     Medication List    TAKE these medications   albuterol 108 (90 Base) MCG/ACT inhaler Commonly known as: VENTOLIN HFA Inhale 1 puff into the lungs every 6 (six) hours as needed for wheezing or shortness of breath.   alendronate 70 MG tablet Commonly known as: FOSAMAX Take 70 mg by mouth once a week. Take with a full glass of water on an empty stomach.   aspirin EC 81 MG tablet Take 81 mg by mouth in the morning and at bedtime. Swallow whole.   atenolol 100 MG tablet Commonly known as: TENORMIN Take 100 mg by mouth daily.   atorvastatin 80 MG tablet Commonly known as: LIPITOR Take 80 mg by mouth daily.   clopidogrel 75 MG tablet Commonly known as: PLAVIX Take 75 mg by mouth daily.   ezetimibe 10 MG tablet Commonly known as: ZETIA Take 10 mg by mouth daily.   gabapentin 600 MG tablet Commonly known as: NEURONTIN Take 600 mg by mouth 2 (two) times daily.   hydrochlorothiazide 25 MG tablet Commonly known as: HYDRODIURIL Take 25 mg by mouth daily.   lisinopril 20 MG tablet Commonly known as: ZESTRIL Take 20 mg by mouth daily.   montelukast 10 MG tablet Commonly known as: SINGULAIR Take 10 mg by mouth at bedtime.   nitroGLYCERIN 0.4 MG SL tablet Commonly known as: NITROSTAT Place 0.4 mg under the tongue every 5 (five) minutes as needed for chest pain.   REFRESH OP Place 1 drop into both eyes daily.   Stiolto Respimat 2.5-2.5 MCG/ACT Aers Generic drug: Tiotropium Bromide-Olodaterol Inhale 2 puffs into the lungs daily.       Disposition and follow-up:   Mr.Brandon Daniel was discharged from [facility] in Good condition.  At the hospital follow up visit please address:  1.  Follow up: - Stable angina, ruled out ACS: continue home meds, consider further ischemic workup for pre-op planning of decides for elective spine surgery - Lumbar stenosis and disc herniation with radiculopathy: follow up with neurosurgery to discuss elective surgery  2.  Labs / imaging needed at time of follow-up: none  3.  Pending labs/ test needing follow-up: none  Follow-up Appointments:  Follow-up Information    Coletta Memos, MD. Schedule an appointment as soon as possible for a visit.   Specialty: Neurosurgery Contact information: [address] [place] [address] [phone]               Hospital Course by problem list:  #ACS vs stable angina Patient came to the ED to be evaluated for left leg weakness and numbness and when walking in, he developed chest pain, SOB and started sweating. He had a CABG in 2015 and states he has 18 stents. Had stress test 3 years ago in [place] which did not show ischemia. Chest pain self-resolved before any intervention.  It did not re-occur. SOB resolved as well without intervention. Troponin 9 > 17 > 24 > 26 > 13. EGK reassuring. Cardiology initially treated as ACS with IV heparin and IV nitro. Given minimal and resolving troponin now considering this as stable angina. Continue home meds,  he is already on DAPT and beta blocker.  #Lumbar stenosis and disc herniation with radiculopathy: Patient had been experiencing left leg weakness and areas of numbness for some time, and it had gradually worsened before he presented to the ED. He had a discectomy in 2019 for disc herniation with radiculopathy. Presented with worsening weakness and increased area of numbness. On exam has 4/5 strength on LLE, otherweise intact neurologically. MRI this admission demonstrates again severe multilevel disease, including L foraminal disc protrusion at L5-S1,  impinging upon the transiting left L5 nerve root which likely explain his symptoms. Neurolosurgery evaluated and planning for outpatient follow up for elective procedure after he returns from his trip to [place].  Pertinent Labs, Studies, and Procedures:  CT Head Wo Contrast  Result Date: 10/20/2020 CLINICAL DATA:  Dizziness, nonspecific. Additional history provided: Patient reports leg weakness and dizziness while walking yesterday with collapse, blurred vision, chest pain and dizziness today. EXAM: CT HEAD WITHOUT CONTRAST TECHNIQUE: Contiguous axial images were obtained from the base of the skull through the vertex without intravenous contrast. COMPARISON:  No pertinent prior exams available for comparison. FINDINGS: Brain: Mild generalized parenchymal atrophy. There is no acute intracranial hemorrhage. No demarcated cortical infarct. No extra-axial fluid collection. No evidence of intracranial mass. No midline shift. Vascular: No hyperdense vessel.  Atherosclerotic calcifications Skull: Normal. Negative for fracture or focal lesion. Sinuses/Orbits: Visualized orbits show no acute finding. Mild mucosal thickening within the inferior left frontal sinus. Moderate mucosal thickening within the bilateral ethmoid air cells. Fluid level and moderate mucosal thickening within the left sphenoid sinus with associated chronic reactive osteitis. Extensive partial opacification of the left maxillary sinus with associated chronic reactive osteitis. Mild mucosal thickening and small mucous retention cyst within the imaged right maxillary sinus. Other: 13 mm polypoid soft tissue focus within the posterior right nasal passage. IMPRESSION: No evidence of acute intracranial abnormality. Mild generalized parenchymal atrophy. Fairly extensive paranasal sinus disease, as described. 13 mm focus of polypoid soft tissue within the posterior right nasal passage. Nonemergent ENT consultation should be considered. Electronically Signed    By: Jackey Loge DO   On: 10/20/2020 10:33   MR Lumbar Spine W Wo Contrast  Result Date: 10/20/2020 CLINICAL DATA:  Initial evaluation for lumbosacral spinal stenosis. EXAM: MRI LUMBAR SPINE WITHOUT AND WITH CONTRAST TECHNIQUE: Multiplanar and multiecho pulse sequences of the lumbar spine were obtained without and with intravenous contrast. CONTRAST:  45mL GADAVIST GADOBUTROL 1 MMOL/ML IV SOLN COMPARISON:  None. FINDINGS: Segmentation: Standard. Lowest well-formed disc space labeled the L5-S1 level. Alignment: Straightening with slight reversal of the normal lumbar lordosis. No listhesis. Vertebrae: Chronic compression deformity involving the L1 vertebral body with up to 50% height loss without significant bony retropulsion. Otherwise, vertebral body height maintained without acute or subacute fracture. Bone marrow signal intensity within normal limits. Small benign hemangioma noted within the L2 vertebral body. No other discrete or worrisome osseous lesions. Minimal reactive endplate change present about the L3-4 interspace. No other abnormal marrow edema or enhancement. Conus medullaris and cauda equina: Conus extends to the T12-L1 level. Conus and cauda equina appear normal. Paraspinal and other soft tissues: Paraspinous soft tissues within normal limits. Visualized visceral structures are normal. Disc levels: Mild diffuse shortening of the pedicles with a degree of underlying congenital spinal stenosis noted. T12-L1: Minimal disc bulge with facet hypertrophy. No significant spinal stenosis. Foramina remain patent. L1-2: Circumferential disc bulge with disc desiccation. Central annular fissure. Mild facet hypertrophy. Changes superimposed on short pedicles resultant  mild spinal stenosis. Foramina remain patent. L2-3: Disc desiccation with minimal annular disc bulge. Minimal facet hypertrophy. No significant spinal stenosis. Mild left L2 foraminal narrowing. Right neural foramina remains patent. L3-4: Mild  disc bulge with disc desiccation. Superimposed tiny central disc protrusion minimally indents the ventral thecal sac (series 6, image 24). Mild facet hypertrophy. Changes superimposed on short pedicles resultant mild spinal stenosis. Mild bilateral L3 foraminal narrowing. L4-5: Degenerative intervertebral disc space narrowing with diffuse disc bulge and disc desiccation. Mild reactive endplate change. Mild to moderate bilateral facet hypertrophy. Changes superimposed on short pedicles resultant mild canal with mild to moderate bilateral lateral recess stenosis. Moderate bilateral L4 foraminal narrowing. L5-S1: Mild disc bulge with disc desiccation. Mild to moderate bilateral facet hypertrophy. No canal or lateral recess stenosis. Superimposed left foraminal disc protrusion impinges upon the transiting left L5 nerve root (series 7, image 12). Moderate left worse than right L5 foraminal narrowing. IMPRESSION: 1. Left foraminal disc protrusion at L5-S1, impinging upon the transiting left L5 nerve root. 2. Degenerative disc bulge with facet hypertrophy at L4-5 with resultant mild to moderate bilateral lateral recess stenosis, with moderate bilateral L4 foraminal narrowing. 3. Additional mild noncompressive disc bulging and facet hypertrophy elsewhere within the lumbar spine with resultant mild spinal stenosis at L1-2 and L3-4. 4. Chronic L1 compression deformity with up to 50% height loss without bony retropulsion. Electronically Signed   By: Rise Mu M.D.   On: 10/20/2020 20:42   DG Chest Portable 1 View  Result Date: 10/20/2020 CLINICAL DATA:  Shortness of breath and chest pain EXAM: PORTABLE CHEST 1 VIEW COMPARISON:  None. FINDINGS: Blunting of the left costophrenic angle may be due to scarring. A small associated pleural effusion on the left cannot be excluded. Lungs otherwise are somewhat hyperexpanded. There are scattered areas of scarring in the lung bases and left mid lung. No edema or  consolidation. Heart size and pulmonary vascularity are within normal limits. Patient is status post median sternotomy. There is aortic atherosclerosis. No bone lesions. IMPRESSION: Scarring lateral left base with questionable small associated pleural effusion on the left. Scattered areas of scarring elsewhere in the bases and left mid lung. No edema or airspace opacity. Underlying mild hyperexpansion. Heart size within normal limits. Postoperative changes noted. Aortic Atherosclerosis (ICD10-I70.0). Electronically Signed   By: Bretta Bang III M.D.   On: 10/20/2020 09:51      Discharge Instructions: Discharge Instructions    Diet - low sodium heart healthy   Complete by: As directed    Discharge instructions   Complete by: As directed    Mr. Leighton, it was a pleasure taking care of you. You were admitted for weakness of the left leg and concern for heart attack. I reviewed your case with a neurosurgeon who does not believe this was an urgent case, he wants to follow up after you return from [place] to give you heart a chance to recover to minimize your chance for surgical complication.  Please continue all your current medications. Call the neurosurgery office, number provided elsewhere, after you return to schedule an appointment to see about this elective surgery. You may also do this in [place] if you are returning there.   Increase activity slowly   Complete by: As directed       Signed: Remo Lipps, MD 10/21/2020, 4:29 PM   Pager: [phone]

## 2020-10-21 NOTE — Hospital Course (Addendum)
Follow up: - Stable angina, ruled out ACS: continue home meds, consider further ischemic workup for pre-op planning of decides for elective spine surgery - Lumbar stenosis and disc herniation with radiculopathy: follow up with neurosurgery to discuss elective surgery  Hospital course by problem list:  #ACS vs stable angina Patient came to the ED to be evaluated for left leg weakness and numbness and when walking in, he developed chest pain, SOB and started sweating. He had a CABG in 2015 and states he has 18 stents. Had stress test 3 years ago in [place] which did not show ischemia. Chest pain self-resolved before any intervention.  It did not re-occur. SOB resolved as well without intervention. Troponin 9 > 17 > 24 > 26 > 13. EGK reassuring. Cardiology initially treated as ACS with IV heparin and IV nitro. Given minimal and resolving troponin now considering this as stable angina. Continue home meds, he is already on DAPT and beta blocker.  #Lumbar stenosis and disc herniation with radiculopathy: Patient had been experiencing left leg weakness and areas of numbness for some time, and it had gradually worsened before he presented to the ED. He had a discectomy in 2019 for disc herniation with radiculopathy. Presented with worsening weakness and increased area of numbness. On exam has 4/5 strength on LLE, otherweise intact neurologically. MRI this admission demonstrates again severe multilevel disease, including L foraminal disc protrusion at L5-S1, impinging upon the transiting left L5 nerve root which likely explain his symptoms. Neurolosurgery evaluated and planning for outpatient follow up for elective procedure after he returns from his trip to [place].

## 2020-10-21 NOTE — Progress Notes (Signed)
Subjective:  Patient denies any chest pain or shortness of breath.  Overall feels well.  Discussed again various options of treatment wanted to be treated medically only for now.  We will follow-up with me once he comes back from [place] or will follow-up with his cardiologist in [facility]  Objective:  Vital Signs in the last 24 hours: Temp:  [97.9 F (36.6 C)-98.4 F (36.9 C)] 98.4 F (36.9 C) (04/27 0338) Pulse Rate:  [38-58] 58 (04/27 1028) Resp:  [14-22] 16 (04/27 0338) BP: (134-167)/(57-91) 143/67 (04/27 1028) SpO2:  [95 %-100 %] 96 % (04/27 1101)  Intake/Output from previous day: 04/26 0701 - 04/27 0700 In: 215.2 [I.V.:215.2] Out: -  Intake/Output from this shift: Total I/O In: 109.1 [I.V.:109.1] Out: -   Physical Exam: Neck: no adenopathy, no carotid bruit, no JVD and supple, symmetrical, trachea midline Lungs: Decreased breath sound at bases Heart: regular rate and rhythm, S1, S2 normal and no S3 or S4 Abdomen: soft, non-tender; bowel sounds normal; no masses,  no organomegaly Extremities: extremities normal, atraumatic, no cyanosis or edema  Lab Results: Recent Labs    10/20/20 0924 10/21/20 0422  WBC 5.2 5.4  HGB 12.2* 11.8*  PLT 153 134*   Recent Labs    10/20/20 0924 10/21/20 0422  NA 135 139  K 4.1 3.8  CL 100 99  CO2 28 29  GLUCOSE 131* 122*  BUN 12 10  CREATININE 0.83 0.75   No results for input(s): TROPONINI in the last 72 hours.  Invalid input(s): CK, MB Hepatic Function Panel No results for input(s): PROT, ALBUMIN, AST, ALT, ALKPHOS, BILITOT, BILIDIR, IBILI in the last 72 hours. No results for input(s): CHOL in the last 72 hours. No results for input(s): PROTIME in the last 72 hours.  Imaging: Imaging results have been reviewed and CT Head Wo Contrast  Result Date: 10/20/2020 CLINICAL DATA:  Dizziness, nonspecific. Additional history provided: Patient reports leg weakness and dizziness while walking yesterday with collapse, blurred  vision, chest pain and dizziness today. EXAM: CT HEAD WITHOUT CONTRAST TECHNIQUE: Contiguous axial images were obtained from the base of the skull through the vertex without intravenous contrast. COMPARISON:  No pertinent prior exams available for comparison. FINDINGS: Brain: Mild generalized parenchymal atrophy. There is no acute intracranial hemorrhage. No demarcated cortical infarct. No extra-axial fluid collection. No evidence of intracranial mass. No midline shift. Vascular: No hyperdense vessel.  Atherosclerotic calcifications Skull: Normal. Negative for fracture or focal lesion. Sinuses/Orbits: Visualized orbits show no acute finding. Mild mucosal thickening within the inferior left frontal sinus. Moderate mucosal thickening within the bilateral ethmoid air cells. Fluid level and moderate mucosal thickening within the left sphenoid sinus with associated chronic reactive osteitis. Extensive partial opacification of the left maxillary sinus with associated chronic reactive osteitis. Mild mucosal thickening and small mucous retention cyst within the imaged right maxillary sinus. Other: 13 mm polypoid soft tissue focus within the posterior right nasal passage. IMPRESSION: No evidence of acute intracranial abnormality. Mild generalized parenchymal atrophy. Fairly extensive paranasal sinus disease, as described. 13 mm focus of polypoid soft tissue within the posterior right nasal passage. Nonemergent ENT consultation should be considered. Electronically Signed   By: Jackey Loge DO   On: 10/20/2020 10:33   MR Lumbar Spine W Wo Contrast  Result Date: 10/20/2020 CLINICAL DATA:  Initial evaluation for lumbosacral spinal stenosis. EXAM: MRI LUMBAR SPINE WITHOUT AND WITH CONTRAST TECHNIQUE: Multiplanar and multiecho pulse sequences of the lumbar spine were obtained without and with intravenous  contrast. CONTRAST:  26mL GADAVIST GADOBUTROL 1 MMOL/ML IV SOLN COMPARISON:  None. FINDINGS: Segmentation: Standard. Lowest  well-formed disc space labeled the L5-S1 level. Alignment: Straightening with slight reversal of the normal lumbar lordosis. No listhesis. Vertebrae: Chronic compression deformity involving the L1 vertebral body with up to 50% height loss without significant bony retropulsion. Otherwise, vertebral body height maintained without acute or subacute fracture. Bone marrow signal intensity within normal limits. Small benign hemangioma noted within the L2 vertebral body. No other discrete or worrisome osseous lesions. Minimal reactive endplate change present about the L3-4 interspace. No other abnormal marrow edema or enhancement. Conus medullaris and cauda equina: Conus extends to the T12-L1 level. Conus and cauda equina appear normal. Paraspinal and other soft tissues: Paraspinous soft tissues within normal limits. Visualized visceral structures are normal. Disc levels: Mild diffuse shortening of the pedicles with a degree of underlying congenital spinal stenosis noted. T12-L1: Minimal disc bulge with facet hypertrophy. No significant spinal stenosis. Foramina remain patent. L1-2: Circumferential disc bulge with disc desiccation. Central annular fissure. Mild facet hypertrophy. Changes superimposed on short pedicles resultant mild spinal stenosis. Foramina remain patent. L2-3: Disc desiccation with minimal annular disc bulge. Minimal facet hypertrophy. No significant spinal stenosis. Mild left L2 foraminal narrowing. Right neural foramina remains patent. L3-4: Mild disc bulge with disc desiccation. Superimposed tiny central disc protrusion minimally indents the ventral thecal sac (series 6, image 24). Mild facet hypertrophy. Changes superimposed on short pedicles resultant mild spinal stenosis. Mild bilateral L3 foraminal narrowing. L4-5: Degenerative intervertebral disc space narrowing with diffuse disc bulge and disc desiccation. Mild reactive endplate change. Mild to moderate bilateral facet hypertrophy. Changes  superimposed on short pedicles resultant mild canal with mild to moderate bilateral lateral recess stenosis. Moderate bilateral L4 foraminal narrowing. L5-S1: Mild disc bulge with disc desiccation. Mild to moderate bilateral facet hypertrophy. No canal or lateral recess stenosis. Superimposed left foraminal disc protrusion impinges upon the transiting left L5 nerve root (series 7, image 12). Moderate left worse than right L5 foraminal narrowing. IMPRESSION: 1. Left foraminal disc protrusion at L5-S1, impinging upon the transiting left L5 nerve root. 2. Degenerative disc bulge with facet hypertrophy at L4-5 with resultant mild to moderate bilateral lateral recess stenosis, with moderate bilateral L4 foraminal narrowing. 3. Additional mild noncompressive disc bulging and facet hypertrophy elsewhere within the lumbar spine with resultant mild spinal stenosis at L1-2 and L3-4. 4. Chronic L1 compression deformity with up to 50% height loss without bony retropulsion. Electronically Signed   By: Rise Mu M.D.   On: 10/20/2020 20:42   DG Chest Portable 1 View  Result Date: 10/20/2020 CLINICAL DATA:  Shortness of breath and chest pain EXAM: PORTABLE CHEST 1 VIEW COMPARISON:  None. FINDINGS: Blunting of the left costophrenic angle may be due to scarring. A small associated pleural effusion on the left cannot be excluded. Lungs otherwise are somewhat hyperexpanded. There are scattered areas of scarring in the lung bases and left mid lung. No edema or consolidation. Heart size and pulmonary vascularity are within normal limits. Patient is status post median sternotomy. There is aortic atherosclerosis. No bone lesions. IMPRESSION: Scarring lateral left base with questionable small associated pleural effusion on the left. Scattered areas of scarring elsewhere in the bases and left mid lung. No edema or airspace opacity. Underlying mild hyperexpansion. Heart size within normal limits. Postoperative changes noted.  Aortic Atherosclerosis (ICD10-I70.0). Electronically Signed   By: Bretta Bang III M.D.   On: 10/20/2020 09:51    Cardiac Studies:  Assessment/Plan:  Stable angina Coronary artery disease status post CABG x2 approximately 17 years ago status post multivessel PCI requiring 18 stents in [place]. Hypertension Hyperlipidemia Morbid obesity History of spinal stenosis Peripheral neuropathy Plan Okay to discharge from cardiac point of view  DC IV heparin Follow-up with me/his cardiologist in [place] once he is back from [place]  LOS: 0 days    Rinaldo Cloud 10/21/2020, 12:09 PM

## 2020-12-03 ENCOUNTER — Encounter (HOSPITAL_COMMUNITY): Payer: Self-pay

## 2020-12-03 ENCOUNTER — Emergency Department (HOSPITAL_COMMUNITY): Payer: Medicare (Managed Care)

## 2020-12-03 ENCOUNTER — Other Ambulatory Visit: Payer: Self-pay

## 2020-12-03 ENCOUNTER — Inpatient Hospital Stay (HOSPITAL_COMMUNITY)
Admission: EM | Admit: 2020-12-03 | Discharge: 2021-01-12 | DRG: 280 | Disposition: A | Discharge status: Skilled Nursing Facility | Payer: Medicare (Managed Care) | Attending: Internal Medicine | Admitting: Internal Medicine

## 2020-12-03 ENCOUNTER — Inpatient Hospital Stay (HOSPITAL_COMMUNITY): Payer: Medicare (Managed Care)

## 2020-12-03 DIAGNOSIS — I63522 Cerebral infarction due to unspecified occlusion or stenosis of left anterior cerebral artery: Secondary | ICD-10-CM | POA: Diagnosis not present

## 2020-12-03 DIAGNOSIS — E876 Hypokalemia: Secondary | ICD-10-CM | POA: Diagnosis not present

## 2020-12-03 DIAGNOSIS — I6522 Occlusion and stenosis of left carotid artery: Secondary | ICD-10-CM | POA: Diagnosis not present

## 2020-12-03 DIAGNOSIS — J15211 Pneumonia due to Methicillin susceptible Staphylococcus aureus: Secondary | ICD-10-CM | POA: Diagnosis not present

## 2020-12-03 DIAGNOSIS — N179 Acute kidney failure, unspecified: Secondary | ICD-10-CM | POA: Diagnosis present

## 2020-12-03 DIAGNOSIS — J4540 Moderate persistent asthma, uncomplicated: Secondary | ICD-10-CM | POA: Diagnosis not present

## 2020-12-03 DIAGNOSIS — R0902 Hypoxemia: Secondary | ICD-10-CM

## 2020-12-03 DIAGNOSIS — N390 Urinary tract infection, site not specified: Secondary | ICD-10-CM | POA: Diagnosis not present

## 2020-12-03 DIAGNOSIS — J440 Chronic obstructive pulmonary disease with acute lower respiratory infection: Secondary | ICD-10-CM | POA: Diagnosis present

## 2020-12-03 DIAGNOSIS — J1008 Influenza due to other identified influenza virus with other specified pneumonia: Secondary | ICD-10-CM | POA: Diagnosis not present

## 2020-12-03 DIAGNOSIS — I10 Essential (primary) hypertension: Secondary | ICD-10-CM | POA: Diagnosis present

## 2020-12-03 DIAGNOSIS — Z79899 Other long term (current) drug therapy: Secondary | ICD-10-CM

## 2020-12-03 DIAGNOSIS — Z20822 Contact with and (suspected) exposure to covid-19: Secondary | ICD-10-CM | POA: Diagnosis not present

## 2020-12-03 DIAGNOSIS — G8191 Hemiplegia, unspecified affecting right dominant side: Secondary | ICD-10-CM | POA: Diagnosis present

## 2020-12-03 DIAGNOSIS — R414 Neurologic neglect syndrome: Secondary | ICD-10-CM | POA: Diagnosis present

## 2020-12-03 DIAGNOSIS — Z9842 Cataract extraction status, left eye: Secondary | ICD-10-CM

## 2020-12-03 DIAGNOSIS — I639 Cerebral infarction, unspecified: Secondary | ICD-10-CM | POA: Diagnosis present

## 2020-12-03 DIAGNOSIS — Z955 Presence of coronary angioplasty implant and graft: Secondary | ICD-10-CM

## 2020-12-03 DIAGNOSIS — Z7982 Long term (current) use of aspirin: Secondary | ICD-10-CM

## 2020-12-03 DIAGNOSIS — L8990 Pressure ulcer of unspecified site, unspecified stage: Secondary | ICD-10-CM | POA: Insufficient documentation

## 2020-12-03 DIAGNOSIS — R0600 Dyspnea, unspecified: Secondary | ICD-10-CM

## 2020-12-03 DIAGNOSIS — I63512 Cerebral infarction due to unspecified occlusion or stenosis of left middle cerebral artery: Secondary | ICD-10-CM | POA: Diagnosis present

## 2020-12-03 DIAGNOSIS — Z978 Presence of other specified devices: Secondary | ICD-10-CM

## 2020-12-03 DIAGNOSIS — R072 Precordial pain: Secondary | ICD-10-CM

## 2020-12-03 DIAGNOSIS — E860 Dehydration: Secondary | ICD-10-CM | POA: Diagnosis present

## 2020-12-03 DIAGNOSIS — Z9841 Cataract extraction status, right eye: Secondary | ICD-10-CM

## 2020-12-03 DIAGNOSIS — Z833 Family history of diabetes mellitus: Secondary | ICD-10-CM

## 2020-12-03 DIAGNOSIS — E119 Type 2 diabetes mellitus without complications: Secondary | ICD-10-CM | POA: Diagnosis not present

## 2020-12-03 DIAGNOSIS — J1520 Pneumonia due to staphylococcus, unspecified: Secondary | ICD-10-CM | POA: Diagnosis not present

## 2020-12-03 DIAGNOSIS — E785 Hyperlipidemia, unspecified: Secondary | ICD-10-CM

## 2020-12-03 DIAGNOSIS — J111 Influenza due to unidentified influenza virus with other respiratory manifestations: Secondary | ICD-10-CM

## 2020-12-03 DIAGNOSIS — R471 Dysarthria and anarthria: Secondary | ICD-10-CM | POA: Diagnosis present

## 2020-12-03 DIAGNOSIS — J45909 Unspecified asthma, uncomplicated: Secondary | ICD-10-CM

## 2020-12-03 DIAGNOSIS — R29810 Facial weakness: Secondary | ICD-10-CM | POA: Diagnosis present

## 2020-12-03 DIAGNOSIS — J101 Influenza due to other identified influenza virus with other respiratory manifestations: Secondary | ICD-10-CM | POA: Diagnosis not present

## 2020-12-03 DIAGNOSIS — I214 Non-ST elevation (NSTEMI) myocardial infarction: Secondary | ICD-10-CM | POA: Diagnosis not present

## 2020-12-03 DIAGNOSIS — I472 Ventricular tachycardia: Secondary | ICD-10-CM | POA: Diagnosis present

## 2020-12-03 DIAGNOSIS — Z452 Encounter for adjustment and management of vascular access device: Secondary | ICD-10-CM

## 2020-12-03 DIAGNOSIS — Z7902 Long term (current) use of antithrombotics/antiplatelets: Secondary | ICD-10-CM

## 2020-12-03 DIAGNOSIS — B961 Klebsiella pneumoniae [K. pneumoniae] as the cause of diseases classified elsewhere: Secondary | ICD-10-CM | POA: Diagnosis not present

## 2020-12-03 DIAGNOSIS — Z7189 Other specified counseling: Secondary | ICD-10-CM | POA: Diagnosis not present

## 2020-12-03 DIAGNOSIS — R29703 NIHSS score 3: Secondary | ICD-10-CM | POA: Diagnosis present

## 2020-12-03 DIAGNOSIS — E669 Obesity, unspecified: Secondary | ICD-10-CM | POA: Diagnosis present

## 2020-12-03 DIAGNOSIS — E871 Hypo-osmolality and hyponatremia: Secondary | ICD-10-CM | POA: Diagnosis present

## 2020-12-03 DIAGNOSIS — R1310 Dysphagia, unspecified: Secondary | ICD-10-CM | POA: Diagnosis not present

## 2020-12-03 DIAGNOSIS — R1312 Dysphagia, oropharyngeal phase: Secondary | ICD-10-CM | POA: Diagnosis present

## 2020-12-03 DIAGNOSIS — R1311 Dysphagia, oral phase: Secondary | ICD-10-CM | POA: Diagnosis not present

## 2020-12-03 DIAGNOSIS — Z6832 Body mass index (BMI) 32.0-32.9, adult: Secondary | ICD-10-CM | POA: Diagnosis not present

## 2020-12-03 DIAGNOSIS — R069 Unspecified abnormalities of breathing: Secondary | ICD-10-CM

## 2020-12-03 DIAGNOSIS — R197 Diarrhea, unspecified: Secondary | ICD-10-CM | POA: Diagnosis present

## 2020-12-03 DIAGNOSIS — R059 Cough, unspecified: Secondary | ICD-10-CM

## 2020-12-03 DIAGNOSIS — R4701 Aphasia: Secondary | ICD-10-CM | POA: Diagnosis present

## 2020-12-03 DIAGNOSIS — R319 Hematuria, unspecified: Secondary | ICD-10-CM | POA: Diagnosis not present

## 2020-12-03 DIAGNOSIS — J9601 Acute respiratory failure with hypoxia: Secondary | ICD-10-CM | POA: Diagnosis not present

## 2020-12-03 DIAGNOSIS — I25119 Atherosclerotic heart disease of native coronary artery with unspecified angina pectoris: Secondary | ICD-10-CM | POA: Diagnosis not present

## 2020-12-03 DIAGNOSIS — J189 Pneumonia, unspecified organism: Secondary | ICD-10-CM

## 2020-12-03 DIAGNOSIS — Z1612 Extended spectrum beta lactamase (ESBL) resistance: Secondary | ICD-10-CM | POA: Diagnosis not present

## 2020-12-03 DIAGNOSIS — I82401 Acute embolism and thrombosis of unspecified deep veins of right lower extremity: Secondary | ICD-10-CM | POA: Diagnosis not present

## 2020-12-03 DIAGNOSIS — J690 Pneumonitis due to inhalation of food and vomit: Secondary | ICD-10-CM | POA: Diagnosis not present

## 2020-12-03 DIAGNOSIS — Z8249 Family history of ischemic heart disease and other diseases of the circulatory system: Secondary | ICD-10-CM

## 2020-12-03 DIAGNOSIS — I63232 Cerebral infarction due to unspecified occlusion or stenosis of left carotid arteries: Secondary | ICD-10-CM | POA: Diagnosis not present

## 2020-12-03 DIAGNOSIS — Z4659 Encounter for fitting and adjustment of other gastrointestinal appliance and device: Secondary | ICD-10-CM

## 2020-12-03 DIAGNOSIS — L89892 Pressure ulcer of other site, stage 2: Secondary | ICD-10-CM | POA: Diagnosis not present

## 2020-12-03 DIAGNOSIS — Z87891 Personal history of nicotine dependence: Secondary | ICD-10-CM

## 2020-12-03 DIAGNOSIS — Z515 Encounter for palliative care: Secondary | ICD-10-CM | POA: Diagnosis not present

## 2020-12-03 DIAGNOSIS — R509 Fever, unspecified: Secondary | ICD-10-CM

## 2020-12-03 DIAGNOSIS — I2510 Atherosclerotic heart disease of native coronary artery without angina pectoris: Secondary | ICD-10-CM

## 2020-12-03 DIAGNOSIS — R0602 Shortness of breath: Secondary | ICD-10-CM

## 2020-12-03 DIAGNOSIS — J90 Pleural effusion, not elsewhere classified: Secondary | ICD-10-CM

## 2020-12-03 LAB — CBC
HCT: 38.3 % — ABNORMAL LOW (ref 39.0–52.0)
Hemoglobin: 11.9 g/dL — ABNORMAL LOW (ref 13.0–17.0)
MCH: 28.8 pg (ref 26.0–34.0)
MCHC: 31.1 g/dL (ref 30.0–36.0)
MCV: 92.7 fL (ref 80.0–100.0)
Platelets: 189 10*3/uL (ref 150–400)
RBC: 4.13 MIL/uL — ABNORMAL LOW (ref 4.22–5.81)
RDW: 13.9 % (ref 11.5–15.5)
WBC: 11 10*3/uL — ABNORMAL HIGH (ref 4.0–10.5)
nRBC: 0 % (ref 0.0–0.2)

## 2020-12-03 LAB — BASIC METABOLIC PANEL
Anion gap: 9 (ref 5–15)
BUN: 14 mg/dL (ref 8–23)
CO2: 25 mmol/L (ref 22–32)
Calcium: 9 mg/dL (ref 8.9–10.3)
Chloride: 100 mmol/L (ref 98–111)
Creatinine, Ser: 0.74 mg/dL (ref 0.61–1.24)
GFR, Estimated: >60 mL/min (ref >60)
Glucose, Bld: 182 mg/dL — ABNORMAL HIGH (ref 70–99)
Potassium: 4.1 mmol/L (ref 3.5–5.1)
Sodium: 134 mmol/L — ABNORMAL LOW (ref 135–145)

## 2020-12-03 LAB — TROPONIN I (HIGH SENSITIVITY)
Troponin I (High Sensitivity): 86 ng/L — ABNORMAL HIGH (ref <18)
Troponin I (High Sensitivity): 124 ng/L (ref <18)
Troponin I (High Sensitivity): 94 ng/L — ABNORMAL HIGH (ref <18)
Troponin I (High Sensitivity): 94 ng/L — ABNORMAL HIGH (ref <18)

## 2020-12-03 LAB — RESP PANEL BY RT-PCR (FLU A&B, COVID) ARPGX2
Influenza A by PCR: POSITIVE — AB
Influenza B by PCR: NEGATIVE
SARS Coronavirus 2 by RT PCR: NEGATIVE

## 2020-12-03 LAB — GLUCOSE, CAPILLARY
Glucose-Capillary: 162 mg/dL — ABNORMAL HIGH (ref 70–99)
Glucose-Capillary: 193 mg/dL — ABNORMAL HIGH (ref 70–99)

## 2020-12-03 LAB — BRAIN NATRIURETIC PEPTIDE: B Natriuretic Peptide: 127 pg/mL — ABNORMAL HIGH (ref 0.0–100.0)

## 2020-12-03 LAB — D-DIMER, QUANTITATIVE: D-Dimer, Quant: 0.45 ug/mL-FEU (ref 0.00–0.50)

## 2020-12-03 MED ORDER — SODIUM CHLORIDE 0.9 % IV SOLN: INTRAVENOUS | Status: AC

## 2020-12-03 MED ORDER — SODIUM CHLORIDE 0.9 % IV SOLN: 50.0000 mL | Freq: Once | INTRAVENOUS | Status: DC

## 2020-12-03 MED ORDER — EZETIMIBE 10 MG PO TABS
10.0000 mg | ORAL_TABLET | Freq: Every day | ORAL | Status: DC
  Administered 2020-12-03: 10 mg via ORAL
  Filled 2020-12-03: qty 1

## 2020-12-03 MED ORDER — ACETAMINOPHEN 500 MG PO TABS
1000.0000 mg | ORAL_TABLET | Freq: Four times a day (QID) | ORAL | Status: DC | PRN
  Administered 2020-12-03: 1000 mg via ORAL
  Filled 2020-12-03: qty 2

## 2020-12-03 MED ORDER — SODIUM CHLORIDE 0.9 % IV SOLN: INTRAVENOUS | Status: DC

## 2020-12-03 MED ORDER — LISINOPRIL 20 MG PO TABS
20.0000 mg | ORAL_TABLET | Freq: Every day | ORAL | Status: DC
  Administered 2020-12-03: 20 mg via ORAL
  Filled 2020-12-03: qty 1

## 2020-12-03 MED ORDER — CLOPIDOGREL BISULFATE 75 MG PO TABS
75.0000 mg | ORAL_TABLET | Freq: Every day | ORAL | Status: DC
  Administered 2020-12-03: 75 mg via ORAL
  Filled 2020-12-03: qty 1

## 2020-12-03 MED ORDER — OSELTAMIVIR PHOSPHATE 75 MG PO CAPS
75.0000 mg | ORAL_CAPSULE | Freq: Two times a day (BID) | ORAL | Status: DC
  Administered 2020-12-03 (×2): 75 mg via ORAL
  Filled 2020-12-03 (×6): qty 1

## 2020-12-03 MED ORDER — ENOXAPARIN SODIUM 40 MG/0.4ML IJ SOSY
40.0000 mg | PREFILLED_SYRINGE | Freq: Every day | INTRAMUSCULAR | Status: DC
  Administered 2020-12-03: 40 mg via SUBCUTANEOUS
  Filled 2020-12-03: qty 0.4

## 2020-12-03 MED ORDER — NITROGLYCERIN 0.4 MG SL SUBL: 0.4000 mg | SUBLINGUAL_TABLET | SUBLINGUAL | Status: DC | PRN

## 2020-12-03 MED ORDER — ONDANSETRON HCL 4 MG/2ML IJ SOLN
4.0000 mg | Freq: Four times a day (QID) | INTRAMUSCULAR | Status: DC | PRN
  Administered 2020-12-28 – 2021-01-04 (×3): 4 mg via INTRAVENOUS
  Filled 2020-12-03 (×4): qty 2

## 2020-12-03 MED ORDER — ATORVASTATIN CALCIUM 80 MG PO TABS
80.0000 mg | ORAL_TABLET | Freq: Every day | ORAL | Status: DC
  Administered 2020-12-03: 80 mg via ORAL
  Filled 2020-12-03: qty 1

## 2020-12-03 MED ORDER — ALBUTEROL SULFATE HFA 108 (90 BASE) MCG/ACT IN AERS
2.0000 | INHALATION_SPRAY | Freq: Once | RESPIRATORY_TRACT | Status: AC
  Administered 2020-12-03: 2 via RESPIRATORY_TRACT
  Filled 2020-12-03: qty 6.7

## 2020-12-03 MED ORDER — IOHEXOL 350 MG/ML SOLN
75.0000 mL | Freq: Once | INTRAVENOUS | Status: AC | PRN
  Administered 2020-12-03: 75 mL via INTRAVENOUS

## 2020-12-03 MED ORDER — ARFORMOTEROL TARTRATE 15 MCG/2ML IN NEBU
15.0000 ug | INHALATION_SOLUTION | Freq: Two times a day (BID) | RESPIRATORY_TRACT | Status: DC
  Administered 2020-12-03 – 2021-01-12 (×76): 15 ug via RESPIRATORY_TRACT
  Filled 2020-12-03 (×79): qty 2

## 2020-12-03 MED ORDER — GABAPENTIN 600 MG PO TABS
600.0000 mg | ORAL_TABLET | Freq: Two times a day (BID) | ORAL | Status: DC
  Administered 2020-12-03 (×2): 600 mg via ORAL
  Filled 2020-12-03 (×6): qty 1

## 2020-12-03 MED ORDER — GUAIFENESIN-DM 100-10 MG/5ML PO SYRP: 5.0000 mL | ORAL_SOLUTION | ORAL | Status: DC | PRN

## 2020-12-03 MED ORDER — PANTOPRAZOLE SODIUM 40 MG IV SOLR
40.0000 mg | Freq: Every day | INTRAVENOUS | Status: DC
  Administered 2020-12-04 – 2020-12-13 (×11): 40 mg via INTRAVENOUS
  Filled 2020-12-03 (×11): qty 40

## 2020-12-03 MED ORDER — ATENOLOL 100 MG PO TABS
100.0000 mg | ORAL_TABLET | Freq: Every day | ORAL | Status: DC
  Administered 2020-12-03: 100 mg via ORAL
  Filled 2020-12-03 (×2): qty 1
  Filled 2020-12-03: qty 2

## 2020-12-03 MED ORDER — STROKE: EARLY STAGES OF RECOVERY BOOK
Freq: Once | Status: AC
  Filled 2020-12-03: qty 1

## 2020-12-03 MED ORDER — HYDROCHLOROTHIAZIDE 25 MG PO TABS: 25.0000 mg | ORAL_TABLET | Freq: Every day | ORAL | Status: DC

## 2020-12-03 MED ORDER — ACETAMINOPHEN 325 MG PO TABS
650.0000 mg | ORAL_TABLET | ORAL | Status: DC | PRN
  Administered 2020-12-03 (×2): 650 mg via ORAL
  Filled 2020-12-03 (×2): qty 2

## 2020-12-03 MED ORDER — UMECLIDINIUM BROMIDE 62.5 MCG/INH IN AEPB
1.0000 | INHALATION_SPRAY | Freq: Every day | RESPIRATORY_TRACT | Status: DC
  Filled 2020-12-03 (×2): qty 7

## 2020-12-03 MED ORDER — SENNOSIDES-DOCUSATE SODIUM 8.6-50 MG PO TABS: 1.0000 | ORAL_TABLET | Freq: Every evening | ORAL | Status: DC | PRN

## 2020-12-03 MED ORDER — MONTELUKAST SODIUM 10 MG PO TABS
10.0000 mg | ORAL_TABLET | Freq: Every day | ORAL | Status: DC
  Administered 2020-12-03: 10 mg via ORAL
  Filled 2020-12-03: qty 1

## 2020-12-03 MED ORDER — ALBUTEROL SULFATE (2.5 MG/3ML) 0.083% IN NEBU
3.0000 mL | INHALATION_SOLUTION | RESPIRATORY_TRACT | Status: DC | PRN
  Administered 2020-12-03 – 2020-12-25 (×2): 3 mL via RESPIRATORY_TRACT
  Filled 2020-12-03 (×2): qty 3

## 2020-12-03 MED ORDER — ALTEPLASE (STROKE) FULL DOSE INFUSION
0.9000 mg/kg | Freq: Once | INTRAVENOUS | Status: AC
  Administered 2020-12-03: 89.3 mg via INTRAVENOUS
  Filled 2020-12-03: qty 100

## 2020-12-03 MED ORDER — ASPIRIN EC 81 MG PO TBEC
81.0000 mg | DELAYED_RELEASE_TABLET | Freq: Every day | ORAL | Status: DC
  Administered 2020-12-03: 81 mg via ORAL
  Filled 2020-12-03: qty 1

## 2020-12-03 NOTE — Significant Event (Signed)
At 2200 I went to reassess patient. Pt was difficult to arouse. On assessment it was noted that patient had a slight right facial droop, and right sided weakness in upper and lower extremities. Last known normal was 2100. When pt was asked orientation questions he was unable to speak to answer. RT RN called, and covering MD paged. MD ordered a stat head CT. Myself and Charge RN transported pt to CT. Neuro MD came to assess patient and ordered TPA and additional imaging. Vital signs remained stable throughout event. Pt was transferred to 3M09. Report given to ICU RN at bedside.

## 2020-12-03 NOTE — ED Notes (Signed)
Pt belongings in belonging bag in the room

## 2020-12-03 NOTE — Progress Notes (Signed)
TRH short progress note    This is a 70 y/o male with asthma, CAD, HTN and obesity who came to the ED directly from the airport after returning from [place] with complaints of dyspnea and cough. He was admitted by my colleague this AM. Noted to be hypoxic in the ED and found to have Influenza A. I have reviewed the HPI and examined the patient.   Principal Problem:   Influenza A- h/o Asthma with acute hypoxic respiratory failure - cont Tamiflu and Nebs PRN - resume Singulair - CXR reveals small R pleural effusion- otherwise clear - D dimer neg after his long flight - pulse ox in 80s on room air- needs 4-5 L O2 to stay above 90  Active Problems:   NSTEMI (non-ST elevated myocardial infarction)  - trop I max 124 - likley secondary to hypoxia related to above - h/o CAD s/p CABG - will ask cardiology to evaluate  - cont ASA Plavix, Lipitor  Dehydration, hyponatremia - poor oral intake (he has had about 10 oz of fluid today) - poor appetite - BUN/ Cr ratio ~ 20 signifying dehydration as well - start NS at 75 cc/ hr - encourage oral hydration    Essential hypertension - BP was elevated this AM and now is low - dc Lisinopril for now - cont Atenolol  Obesity Body mass index is 32.3 kg/m.  Calvert Cantor, MD Pager: [email or web address]

## 2020-12-03 NOTE — ED Triage Notes (Signed)
Pt report that [place], [place] plane ride where he was also hospitalized for his heart, pt became suddenly SOB, 88% on RA, placed on 2L, speaks in short sentences, labored breathing. C/o of bilateral leg pain

## 2020-12-03 NOTE — Code Documentation (Signed)
Stroke Response Nurse Documentation Code Documentation  Kari Kerth is a 70 y.o. male at [facility] admitted for Influenza A on 6/9 with past medical hx of CABG, CAD, HLD, HTN. Code stroke was activated by Neurology.  Patient was LKW at 2100 and now complaining of right sided weakness. On aspirin 81 mg daily. Stroke team at the bedside on patient arrival. Labs drawn and Patient to CT with team. NIHSS 3, see documentation for details and code stroke times. Patient with right facial droop, right arm weakness, and right leg weakness on exam. The following imaging was completed:  CTA head and neck.  Patient is a candidate for tPA due to fixed neurological deficit. Bedside handoff with 78M RN Becky.    Rose Fillers  Stroke Response RN

## 2020-12-03 NOTE — ED Notes (Signed)
Pt 87% RA. 2L O2 placed. sats 91-93%. Triage RN notified
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
## 2020-12-03 NOTE — ED Notes (Addendum)
Pharmacy for 3E notified of need to verify inhalers Ellipta and Brovana. 3E RT notified of pending arrival.

## 2020-12-03 NOTE — Progress Notes (Signed)
Overnight event  RN went to check on the patient around 10:10 pm and noticed acute onset right-sided weakness, pronator drift, and right-sided facial droop. LKN 9 pm.   Patient seen and examined at bedside.  He has no complaints.  Denies history of prior stroke.  On exam, noticed to have very mild right-sided facial droop and mild right upper and lower extremity weakness.  He does have right arm pronator drift.  -Stat head CT ordered and currently pending -Neurology consulted

## 2020-12-03 NOTE — ED Provider Notes (Signed)
Emergency Department Provider Note   I have reviewed the triage vital signs and the nursing notes.   HISTORY  Chief Complaint Shortness of Breath   HPI Brandon Daniel is a 70 y.o. male with past medical history reviewed below presents to the emergency department with shortness of breath worsening over the past week.  Patient states he is got some associated chest tightness although denies pleuritic chest pain.  No fevers or chills.  Patient was admitted in April to [facility] with angina type symptoms.  Patient does have history of CABG in 2015.  He states that in addition to his shortness of breath and chest tightness he did have some diaphoresis earlier today but that is improved.  He feels like his legs are swelling.  He recently returned from [place] on [place] plane flight and since getting off the plane has had continued shortness of breath which prompted his ED visit.  He was, however, having symptoms prior to the flight.   Past Medical History:  Diagnosis Date   Asthma-COPD overlap syndrome (HCC)    CAD (coronary artery disease)    HLD (hyperlipidemia)    HTN (hypertension)    Lumbar disc herniation with radiculopathy    Lumbar stenosis    Osteoporosis     Patient Active Problem List   Diagnosis Date Noted   NSTEMI (non-ST elevated myocardial infarction) (HCC) 12/03/2020   Influenza A 12/03/2020   CAD (coronary artery disease) 12/03/2020   Asthma 12/03/2020   Essential hypertension 12/03/2020   Hyperlipidemia 12/03/2020   Dyspnea 12/03/2020   Pleural effusion on left 12/03/2020   Lumbosacral disc herniation    Chest pain 10/20/2020    Past Surgical History:  Procedure Laterality Date   CATARACT EXTRACTION, BILATERAL  2021   CORONARY ARTERY BYPASS GRAFT  2005   INGUINAL HERNIA REPAIR  1998   LUMBAR DISC SURGERY  2019    Allergies Patient has no known allergies.  Family History  Problem Relation Age of Onset   Cancer Mother    CAD Father    Diabetes Other     Colon cancer Other    Prostate cancer Other     Social History Social History   Tobacco Use   Smoking status: Former    Packs/day: 2.00    Years: 30.00    Pack years: 60.00    Types: Cigarettes   Smokeless tobacco: Never  Substance Use Topics   Alcohol use: Never   Drug use: Never    Review of Systems  Constitutional: No fever/chills Eyes: No visual changes. ENT: No sore throat. Cardiovascular: Positive chest pain and diaphoresis.  Respiratory: Positive shortness of breath. Gastrointestinal: No abdominal pain.  No nausea, no vomiting.  No diarrhea.  No constipation. Genitourinary: Negative for dysuria. Musculoskeletal: Negative for back pain. Skin: Negative for rash. Neurological: Negative for headaches, focal weakness or numbness.  10-point ROS otherwise negative.  ____________________________________________   PHYSICAL EXAM:  VITAL SIGNS: ED Triage Vitals  Enc Vitals Group     BP 12/03/20 0229 (!) 154/98     Pulse Rate 12/03/20 0229 (!) 117     Resp 12/03/20 0229 (!) 28     Temp 12/03/20 0229 100.1 F (37.8 C)     Temp Source 12/03/20 0229 Oral     SpO2 12/03/20 0229 (!) 89 %     Weight 12/03/20 0323 225 lb (102.1 kg)     Height 12/03/20 0323 5\' 9"  (1.753 m)    Constitutional: Alert  and oriented. Well appearing and in no acute distress. Eyes: Conjunctivae are normal.  Head: Atraumatic. Nose: No congestion/rhinnorhea. Mouth/Throat: Mucous membranes are moist.   Neck: No stridor.   Cardiovascular: Tachycardia. Good peripheral circulation. Grossly normal heart sounds.   Respiratory: Positive respiratory effort.  No retractions. Lungs CTAB. Gastrointestinal: Soft and nontender. No distention.  Musculoskeletal: No lower extremity tenderness with 1+ pitting edema bilaterally. No gross deformities of extremities. Neurologic:  Normal speech and language.  Skin:  Skin is warm, dry and intact. No rash  noted.   ____________________________________________   LABS (all labs ordered are listed, but only abnormal results are displayed)  Labs Reviewed  RESP PANEL BY RT-PCR (FLU A&B, COVID) ARPGX2 - Abnormal; Notable for the following components:      Result Value   Influenza A by PCR POSITIVE (*)    All other components within normal limits  CBC - Abnormal; Notable for the following components:   WBC 11.0 (*)    RBC 4.13 (*)    Hemoglobin 11.9 (*)    HCT 38.3 (*)    All other components within normal limits  BASIC METABOLIC PANEL - Abnormal; Notable for the following components:   Sodium 134 (*)    Glucose, Bld 182 (*)    All other components within normal limits  BRAIN NATRIURETIC PEPTIDE - Abnormal; Notable for the following components:   B Natriuretic Peptide 127.0 (*)    All other components within normal limits  TROPONIN I (HIGH SENSITIVITY) - Abnormal; Notable for the following components:   Troponin I (High Sensitivity) 86 (*)    All other components within normal limits  TROPONIN I (HIGH SENSITIVITY) - Abnormal; Notable for the following components:   Troponin I (High Sensitivity) 124 (*)    All other components within normal limits  TROPONIN I (HIGH SENSITIVITY) - Abnormal; Notable for the following components:   Troponin I (High Sensitivity) 94 (*)    All other components within normal limits  TROPONIN I (HIGH SENSITIVITY) - Abnormal; Notable for the following components:   Troponin I (High Sensitivity) 94 (*)    All other components within normal limits  D-DIMER, QUANTITATIVE   ____________________________________________  EKG   EKG Interpretation  Date/Time:  Thursday December 03 2020 02:35:20 EDT Ventricular Rate:  112 PR Interval:  152 QRS Duration: 84 QT Interval:  318 QTC Calculation: 434 R Axis:   90 Text Interpretation: Sinus tachycardia Rightward axis ST & T wave abnormality, consider lateral ischemia Abnormal ECG No STEMI. Confirmed by Drema Pry  [phone]) on 12/03/2020 2:38:38 AM         ____________________________________________  RADIOLOGY  DG Chest Portable 1 View  Result Date: 12/03/2020 CLINICAL DATA:  Shortness of breath. EXAM: PORTABLE CHEST 1 VIEW COMPARISON:  Chest x-ray 10/20/2020 FINDINGS: The heart size and mediastinal contours are unchanged. Aortic calcification. Cardiac stent. No focal consolidation. No pulmonary edema. Persistent trace to small volume left pleural effusion. No pneumothorax. No acute osseous abnormality. IMPRESSION: Persistent trace to small volume left pleural effusion. Recommend CT chest. Electronically Signed   By: Tish Frederickson M.D.   On: 12/03/2020 03:40    ____________________________________________   PROCEDURES  Procedure(s) performed:   Procedures  CRITICAL CARE Performed by: Maia Plan Total critical care time: 35 minutes Critical care time was exclusive of separately billable procedures and treating other patients. Critical care was necessary to treat or prevent imminent or life-threatening deterioration. Critical care was time spent personally by me on the following activities:  development of treatment plan with patient and/or surrogate as well as nursing, discussions with consultants, evaluation of patient's response to treatment, examination of patient, obtaining history from patient or surrogate, ordering and performing treatments and interventions, ordering and review of laboratory studies, ordering and review of radiographic studies, pulse oximetry and re-evaluation of patient's condition.  Alona Bene, MD Emergency Medicine  ____________________________________________   INITIAL IMPRESSION / ASSESSMENT AND PLAN / ED COURSE  Pertinent labs & imaging results that were available during my care of the patient were reviewed by me and considered in my medical decision making (see chart for details).   Patient presents to the emergency department with shortness of breath  symptoms along with chest tightness and diaphoresis.  No acute ischemic change on his EKG compared to prior.  Chest x-ray shows persistent trace to small volume left pleural effusion.  PE is a consideration with recent flight although was having symptoms prior to this.  ACS also a consideration.  Labs are pending. Patient is on 2L [place] O2 which is new.   Flu A positive. CXR reviewed. D-dimer negative. Troponin just mildly elevated. Will continue to trend.   Discussed patient's case with TRH to request admission. Patient and family (if present) updated with plan. Care transferred to [facility] service.  I reviewed all nursing notes, vitals, pertinent old records, EKGs, labs, imaging (as available).  ____________________________________________  FINAL CLINICAL IMPRESSION(S) / ED DIAGNOSES  Final diagnoses:  Acute respiratory failure with hypoxia (HCC)  Precordial chest pain  Influenza with respiratory manifestation     MEDICATIONS GIVEN DURING THIS VISIT:  Medications  atenolol (TENORMIN) tablet 100 mg (100 mg Oral Given 12/03/20 1158)  atorvastatin (LIPITOR) tablet 80 mg (80 mg Oral Given 12/03/20 1200)  ezetimibe (ZETIA) tablet 10 mg (10 mg Oral Given 12/03/20 1215)  lisinopril (ZESTRIL) tablet 20 mg (20 mg Oral Given 12/03/20 1200)  nitroGLYCERIN (NITROSTAT) SL tablet 0.4 mg (has no administration in time range)  aspirin EC tablet 81 mg (81 mg Oral Given 12/03/20 1201)  clopidogrel (PLAVIX) tablet 75 mg (75 mg Oral Given 12/03/20 1202)  gabapentin (NEURONTIN) tablet 600 mg (600 mg Oral Given 12/03/20 1401)  arformoterol (BROVANA) nebulizer solution 15 mcg (15 mcg Nebulization Not Given 12/03/20 1248)    And  umeclidinium bromide (INCRUSE ELLIPTA) 62.5 MCG/INH 1 puff (1 puff Inhalation Not Given 12/03/20 1135)  albuterol (PROVENTIL) (2.5 MG/3ML) 0.083% nebulizer solution 3 mL (3 mLs Inhalation Given 12/03/20 1206)  acetaminophen (TYLENOL) tablet 650 mg (650 mg Oral Given 12/03/20 1203)  ondansetron (ZOFRAN)  injection 4 mg (has no administration in time range)  enoxaparin (LOVENOX) injection 40 mg (40 mg Subcutaneous Given 12/03/20 1402)  oseltamivir (TAMIFLU) capsule 75 mg (75 mg Oral Given 12/03/20 1157)  montelukast (SINGULAIR) tablet 10 mg (has no administration in time range)  0.9 %  sodium chloride infusion (has no administration in time range)  albuterol (VENTOLIN HFA) 108 (90 Base) MCG/ACT inhaler 2 puff (2 puffs Inhalation Given 12/03/20 0353)    Note:  This document was prepared using Dragon voice recognition software and may include unintentional dictation errors.  Alona Bene, MD, [facility] Emergency Medicine    [place], Arlyss Repress, MD 12/03/20 [phone]

## 2020-12-03 NOTE — ED Notes (Signed)
Alert, tachypneic, NAD, calm, interactive. Reports sob only, denies other sx.

## 2020-12-03 NOTE — Consult Note (Signed)
NEUROLOGY CONSULTATION NOTE   Date of service: December 03, 2020 Patient Name: Brandon Daniel MRN:  [id number] DOB:  [birth year] Reason for consult: "Stroke code" Requesting Provider: Chotiner, Claudean Severance, MD _ _ _   _ __   _ __ _ _  __ __   _ __   __ _  History of Present Illness  Brandon Daniel is a 70 y.o. male with PMH significant for CAD, HTN, lumbar stenosis, HLD who is admitted with shortness of breath and found to be influenza A positive. Other active issues include NSTEMI, dehydration, hyponatremia.  He was last seen normal at 2100 and then found to have mild R facial droop, R arm drift and hand grip weakness along with R leg weakness.  Patient initially endorsed a LKW of noon today but he was not sure what time it was right now upon questioning this further and asking him about how long ago did the weakness start, he endorses that this roughly started about 2 hours ago. Denies any headache. No prior history of strokes or R sided weakness per patient.  CTH w/o contrast was negative for large stroke or ICH.  I discussed benefits of tPA along with risks but patient seemed somewhat encephalopathic and short of breath and I am not sure if he is able to make decisions by himself. Discussed with Dr. Loney Loh who did not see any obvious contraindications to tPA. Patient is on aspirin and plavix, platelet count normal, denies any recent surgery, no MI in the last few weeks, no hx of IVH or significant GI bleeding, no CNS mets, no CNS surgeries.  tPA was given emergently given his R sided deficit. tPA started at 1107. At 50, patient's nephew Mr. Jeralyn Ruths called and I revisited the risks and benefits of tPA and see if he would like me to stop tPA or continue. He agreed to continue tPA.  mRS: 0 tPA: Yes offered Thrombectomy: No, mild symptoms. NIHSS components Score: Comment  1a Level of Conscious 0[x]  1[]  2[]  3[]      1b LOC Questions 0[]  1[x]  2[]       1c LOC Commands 0[x]  1[]  2[]       2 Best  Gaze 0[x]  1[]  2[]       3 Visual 0[x]  1[]  2[]  3[]      4 Facial Palsy 0[]  1[x]  2[]  3[]    Minor R facial asymmetry  5a Motor Arm - left 0[x]  1[]  2[]  3[]  4[]  UN[]    5b Motor Arm - Right 0[x]  1[]  2[]  3[]  4[]  UN[]    6a Motor Leg - Left 0[x]  1[]  2[]  3[]  4[]  UN[]    6b Motor Leg - Right 0[]  1[]  2[x]  3[]  4[]  UN[]    7 Limb Ataxia 0[x]  1[]  2[]  3[]  UN[]     8 Sensory 0[x]  1[]  2[]  UN[]      9 Best Language 0[x]  1[]  2[]  3[]      10 Dysarthria 0[x]  1[]  2[]  UN[]      11 Extinct. and Inattention 0[x]  1[]  2[]       TOTAL: 4      ROS   Constitutional Denies weight loss, fever and chills.  HEENT Denies changes in vision and hearing.  Respiratory Denies SOB and cough.   CV Denies palpitations and CP   GI Denies abdominal pain, nausea, vomiting and diarrhea.   GU Denies dysuria and urinary frequency.   MSK Denies myalgia and joint pain.   Skin Denies rash and pruritus.   Neurological Denies headache and syncope.   Psychiatric Denies recent changes  in mood. Denies anxiety and depression.    Past History   Past Medical History:  Diagnosis Date  . Asthma-COPD overlap syndrome (HCC)   . CAD (coronary artery disease)   . HLD (hyperlipidemia)   . HTN (hypertension)   . Lumbar disc herniation with radiculopathy   . Lumbar stenosis   . Osteoporosis    Past Surgical History:  Procedure Laterality Date  . CATARACT EXTRACTION, BILATERAL  2021  . CORONARY ARTERY BYPASS GRAFT  2005  . INGUINAL HERNIA REPAIR  1998  . LUMBAR DISC SURGERY  2019   Family History  Problem Relation Age of Onset  . Cancer Mother   . CAD Father   . Diabetes Other   . Colon cancer Other   . Prostate cancer Other    Social History   Socioeconomic History  . Marital status: Divorced    Spouse name: Not on file  . Number of children: Not on file  . Years of education: Not on file  . Highest education level: Not on file  Occupational History  . Not on file  Tobacco Use  . Smoking status: Former    Packs/day: 2.00     Years: 30.00    Pack years: 60.00    Types: Cigarettes  . Smokeless tobacco: Never  Substance and Sexual Activity  . Alcohol use: Never  . Drug use: Never  . Sexual activity: Not on file  Other Topics Concern  . Not on file  Social History Narrative  . Not on file   Social Determinants of Health   Financial Resource Strain: Not on file  Food Insecurity: Not on file  Transportation Needs: Not on file  Physical Activity: Not on file  Stress: Not on file  Social Connections: Not on file   No Known Allergies  Medications   Medications Prior to Admission  Medication Sig Dispense Refill Last Dose  . albuterol (VENTOLIN HFA) 108 (90 Base) MCG/ACT inhaler Inhale 1 puff into the lungs every 6 (six) hours as needed for wheezing or shortness of breath.   Past Month  . alendronate (FOSAMAX) 70 MG tablet Take 70 mg by mouth once a week. Take with a full glass of water on an empty stomach.   Past Month  . aspirin EC 81 MG tablet Take 81 mg by mouth in the morning and at bedtime. Swallow whole.   12/01/2020  . atenolol (TENORMIN) 100 MG tablet Take 100 mg by mouth daily.   12/01/2020  . atorvastatin (LIPITOR) 80 MG tablet Take 80 mg by mouth daily.   12/01/2020  . Cholecalciferol (VITAMIN D) 50 MCG (2000 UT) tablet Take 2,000 Units by mouth daily.   12/01/2020  . clopidogrel (PLAVIX) 75 MG tablet Take 75 mg by mouth daily.   12/01/2020  . ezetimibe (ZETIA) 10 MG tablet Take 10 mg by mouth daily.   12/01/2020  . gabapentin (NEURONTIN) 600 MG tablet Take 600 mg by mouth 2 (two) times daily.   12/01/2020  . hydrochlorothiazide (HYDRODIURIL) 25 MG tablet Take 25 mg by mouth daily.   Past Month  . lisinopril (ZESTRIL) 20 MG tablet Take 20 mg by mouth daily.   12/01/2020  . montelukast (SINGULAIR) 10 MG tablet Take 10 mg by mouth at bedtime.   12/01/2020  . nitroGLYCERIN (NITROSTAT) 0.4 MG SL tablet Place 0.4 mg under the tongue every 5 (five) minutes as needed for chest pain.   12/02/2020  . Omega-3 Fatty Acids  (FISH OIL) 1000 MG CPDR  Take 1,000 mg by mouth daily.   12/01/2020  . Polyvinyl Alcohol-Povidone (REFRESH OP) Place 1 drop into both eyes daily.   Past Month  . Tiotropium Bromide-Olodaterol (STIOLTO RESPIMAT) 2.5-2.5 MCG/ACT AERS Inhale 2 puffs into the lungs daily.   12/01/2020     Vitals   Vitals:   12/03/20 1927 12/03/20 2030 12/03/20 2105 12/03/20 2206  BP:  (!) 118/59    Pulse:  75    Resp:  18    Temp:  (!) 103 F (39.4 C) (!) 101.6 F (38.7 C) (!) 102.8 F (39.3 C)  TempSrc:  Oral Oral Oral  SpO2: 96% 93%    Weight:      Height:         Body mass index is 32.3 kg/m.  Physical Exam   General: Laying comfortably in bed; in no acute distress.  HENT: Normal oropharynx and mucosa. Normal external appearance of ears and nose.  Neck: Supple, no pain or tenderness  CV: No JVD. No peripheral edema.  Pulmonary: Symmetric Chest rise. Normal respiratory effort.  Abdomen: Soft to touch, non-tender.  Ext: No cyanosis, edema, or deformity  Skin: No rash. Normal palpation of skin.   Musculoskeletal: Normal digits and nails by inspection. No clubbing.   Neurologic Examination  Mental status/Cognition: Alert, oriented to self, place, not to month but oriented to year, poor attention and encephalopathic. Speech/language: Fluent, comprehension intact, object naming intact, repetition intact. Cranial nerves:   CN II Pupils equal and reactive to light, no VF deficits   CN III,IV,VI EOM intact, no gaze preference or deviation, no nystagmus   CN V normal sensation in V1, V2, and V3 segments bilaterally   CN VII Mild R facial asymmetry   CN VIII normal hearing to speech    CN IX & X normal palatal elevation, no uvular deviation   CN XI 5/5 head turn and 5/5 shoulder shrug bilaterally   CN XII midline tongue protrusion   Motor:  Muscle bulk: normal, tone normal, pronator drift yes in RUE tremor none Mvmt Root Nerve  Muscle Right Left Comments  SA C5/6 Ax Deltoid 4+ 5   EF C5/6 Mc  Biceps 5 5   EE C6/7/8 Rad Triceps 4+ 5   WF C6/7 Med FCR     WE C7/8 PIN ECU     F Ab C8/T1 U ADM/FDI 4+ 5   HF L1/2/3 Fem Illopsoas - 4 5   KE L2/3/4 Fem Quad 4 5   DF L4/5 D Peron Tib Ant 5 5   PF S1/2 Tibial Grc/Sol 5 5    Reflexes:  Right Left Comments  Pectoralis      Biceps (C5/6) 2 2   Brachioradialis (C5/6) 2 2    Triceps (C6/7) 2 2    Patellar (L3/4) 2 2    Achilles (S1)      Hoffman      Plantar     Jaw jerk    Sensation:  Light touch intact   Pin prick    Temperature    Vibration   Proprioception    Coordination/Complex Motor:  - Finger to Nose intact but slowed in RUE - Heel to shin unable to do 2/2 weakness. - Rapid alternating movement slowed in RUE - Gait: not safe to assess 2/2 weakness.  Labs   CBC:  Recent Labs  Lab 12/03/20 0247  WBC 11.0*  HGB 11.9*  HCT 38.3*  MCV 92.7  PLT 189    Basic Metabolic  Panel:  Lab Results  Component Value Date   NA 134 (L) 12/03/2020   K 4.1 12/03/2020   CO2 25 12/03/2020   GLUCOSE 182 (H) 12/03/2020   BUN 14 12/03/2020   CREATININE 0.74 12/03/2020   CALCIUM 9.0 12/03/2020   GFRNONAA >60 12/03/2020   Lipid Panel: No results found for: LDLCALC HgbA1c:  Lab Results  Component Value Date   HGBA1C 5.5 10/20/2020   Urine Drug Screen: No results found for: LABOPIA, COCAINSCRNUR, LABBENZ, AMPHETMU, THCU, LABBARB  Alcohol Level No results found for: ETH  CT Head without contrast: CTH was negative for a large hypodensity concerning for a large territory infarct or hyperdensity concerning for an ICH  CT angio Head and Neck with contrast: Multifocal multivessel narrowing, no LVO  MRI Brain: Pending  Impression   Brandon Daniel is a 70 y.o. male with PMH significant for CAD, HTN, lumbar stenosis, HLD who is admitted with shortness of breath and found to be influenza A positive. Other active issues include NSTEMI, dehydration, hyponatremia. He was last seen normal at 2100 and then found to have mild R  facial droop, R arm drift and hand grip weakness along with R leg weakness.Marland Kitchen. His neurologic examination is notable for RUE, RLE weakness with R facial droop.  Primary Diagnosis:  Other cerebral infarction due to occlusion of stenosis of small artery.  Secondary Diagnosis: Essential (primary) hypertension and Hyponatremia  Recommendations   R sided weakness concerning for a stroke: - Frequent NeuroChecks for post tPA care per stroke unit protocol: - Initial CTH demonstrated no acute hemorrhage or mass - MRI Brain - pending - CTA - multifocal multivessel narrowing including severe in L ICA but no LVO. - TTE - pending - Lipid Panel: LDL - pending.  - Statin: on Atorvastatin 80mg  daily. - HbA1c: pending. - Antithrombotic: Start ASA 81 mg daily if 24 h CTH does not show acute hemorrhage - DVT prophylaxis: SCDs. Pharmacologic prophylaxis if 24 h CTH does not demonstrate acute hemorrhage - Systolic Blood Pressure goal: < 180 mm Hg - Telemetry monitoring for arrhythmia: 72 hours - Swallow screen - ordered - PT/OT/SLP consults  Influenza A- h/o Asthma with acute hypoxic respiratory failure - cont Tamiflu and Nebs PRN - resume Singulair - CXR reveals small R pleural effusion- otherwise clear - D dimer neg after his long flight - pulse ox in 80s on room air- needs 4-5 L O2 to stay above 90   Active Problems:   NSTEMI (non-ST elevated myocardial infarction) - trop I max 124 - likley secondary to hypoxia related to above - h/o CAD s/p CABG - will ask cardiology to evaluate - cont Lipitor - Hold off on Asa and plavix for 24 hours given tPA. [facility].   Dehydration, hyponatremia - poor oral intake (he has had about 10 oz of fluid today) - poor appetite - BUN/ Cr ratio ~ 20 signifying dehydration as well - start NS at 75 cc/ hr - encourage oral hydration     Essential hypertension - Goal Blood pressure < 180/105 in the setting of tPA. - cont Atenolol    Obesity Body mass index is 32.3 kg/m.  ______________________________________________________________________  This patient is critically ill and at significant risk of neurological worsening, death and care requires constant monitoring of vital signs, hemodynamics,respiratory and cardiac monitoring, neurological assessment, discussion with family, other specialists and medical decision making of high complexity. I spent 60 minutes of neurocritical care time  in  the care of  this patient. This was time spent independent of any time provided by nurse practitioner or PA.  Erick Blinks [facility] Pager Number [phone] 12/03/2020  11:49 PM   Thank you for the opportunity to take part in the care of this patient. If you have any further questions, please contact the neurology consultation attending.  Signed,  Erick Blinks [facility] Pager Number [phone] _ _ _   _ __   _ __ _ _  __ __   _ __   __ _

## 2020-12-03 NOTE — Progress Notes (Signed)
Pharmacist Code Stroke Response  Notified to mix tPA at 2300 by Dr. Derry Lory Delivered tPA to RN at 1105  tPA dose = 8.9mg  bolus over 1 minute followed by 80.4mg  for a total dose of 89.3mg  over 1 hour  Issues/delays encountered (if applicable): N/a   Vinnie Level, PharmD., BCPS, BCCCP Clinical Pharmacist Please refer to [facility] for unit-specific pharmacist

## 2020-12-04 ENCOUNTER — Inpatient Hospital Stay (HOSPITAL_COMMUNITY): Payer: Medicare (Managed Care)

## 2020-12-04 DIAGNOSIS — J101 Influenza due to other identified influenza virus with other respiratory manifestations: Secondary | ICD-10-CM

## 2020-12-04 DIAGNOSIS — J4540 Moderate persistent asthma, uncomplicated: Secondary | ICD-10-CM

## 2020-12-04 DIAGNOSIS — I639 Cerebral infarction, unspecified: Secondary | ICD-10-CM

## 2020-12-04 DIAGNOSIS — I214 Non-ST elevation (NSTEMI) myocardial infarction: Secondary | ICD-10-CM

## 2020-12-04 LAB — ECHOCARDIOGRAM COMPLETE
AR max vel: 2.45 cm2
AV Area VTI: 2.62 cm2
AV Area mean vel: 2.38 cm2
AV Mean grad: 5 mmHg
AV Peak grad: 12.3 mmHg
Ao pk vel: 1.75 m/s
Area-P 1/2: 3.91 cm2
Calc EF: 55.6 %
Height: 69 in
S' Lateral: 3.55 cm
Single Plane A2C EF: 52.8 %
Single Plane A4C EF: 56.7 %
Weight: 3555.58 oz

## 2020-12-04 LAB — CBC
HCT: 35.4 % — ABNORMAL LOW (ref 39.0–52.0)
Hemoglobin: 10.6 g/dL — ABNORMAL LOW (ref 13.0–17.0)
MCH: 28.4 pg (ref 26.0–34.0)
MCHC: 29.9 g/dL — ABNORMAL LOW (ref 30.0–36.0)
MCV: 94.9 fL (ref 80.0–100.0)
Platelets: 182 10*3/uL (ref 150–400)
RBC: 3.73 MIL/uL — ABNORMAL LOW (ref 4.22–5.81)
RDW: 13.8 % (ref 11.5–15.5)
WBC: 11.8 10*3/uL — ABNORMAL HIGH (ref 4.0–10.5)
nRBC: 0 % (ref 0.0–0.2)

## 2020-12-04 LAB — LIPID PANEL
Cholesterol: 108 mg/dL (ref 0–200)
HDL: 37 mg/dL — ABNORMAL LOW (ref >40)
LDL Cholesterol: 55 mg/dL (ref 0–99)
Total CHOL/HDL Ratio: 2.9 RATIO
Triglycerides: 79 mg/dL (ref <150)
VLDL: 16 mg/dL (ref 0–40)

## 2020-12-04 LAB — BASIC METABOLIC PANEL
Anion gap: 8 (ref 5–15)
BUN: 18 mg/dL (ref 8–23)
CO2: 27 mmol/L (ref 22–32)
Calcium: 8.3 mg/dL — ABNORMAL LOW (ref 8.9–10.3)
Chloride: 100 mmol/L (ref 98–111)
Creatinine, Ser: 0.74 mg/dL (ref 0.61–1.24)
GFR, Estimated: >60 mL/min (ref >60)
Glucose, Bld: 117 mg/dL — ABNORMAL HIGH (ref 70–99)
Potassium: 4 mmol/L (ref 3.5–5.1)
Sodium: 135 mmol/L (ref 135–145)

## 2020-12-04 LAB — BLOOD GAS, VENOUS
Acid-Base Excess: 2.8 mmol/L — ABNORMAL HIGH (ref 0.0–2.0)
Bicarbonate: 27.7 mmol/L (ref 20.0–28.0)
Drawn by: 6176
O2 Saturation: 92.2 %
Patient temperature: 37
pCO2, Ven: 50.3 mmHg (ref 44.0–60.0)
pH, Ven: 7.36 (ref 7.250–7.430)
pO2, Ven: 67.7 mmHg — ABNORMAL HIGH (ref 32.0–45.0)

## 2020-12-04 LAB — HEMOGLOBIN A1C
Hgb A1c MFr Bld: 6.2 % — ABNORMAL HIGH (ref 4.8–5.6)
Mean Plasma Glucose: 131 mg/dL

## 2020-12-04 LAB — MRSA PCR SCREENING: MRSA by PCR: NEGATIVE

## 2020-12-04 LAB — GLUCOSE, CAPILLARY
Glucose-Capillary: 113 mg/dL — ABNORMAL HIGH (ref 70–99)
Glucose-Capillary: 143 mg/dL — ABNORMAL HIGH (ref 70–99)
Glucose-Capillary: 163 mg/dL — ABNORMAL HIGH (ref 70–99)

## 2020-12-04 MED ORDER — FENTANYL 2500MCG IN NS 250ML (10MCG/ML) PREMIX INFUSION
0.0000 ug/h | INTRAVENOUS | Status: DC
  Administered 2020-12-04: 25 ug/h via INTRAVENOUS
  Administered 2020-12-05: 100 ug/h via INTRAVENOUS
  Administered 2020-12-06: 150 ug/h via INTRAVENOUS
  Administered 2020-12-07 – 2020-12-09 (×5): 125 ug/h via INTRAVENOUS
  Administered 2020-12-10: 200 ug/h via INTRAVENOUS
  Administered 2020-12-10: 125 ug/h via INTRAVENOUS
  Filled 2020-12-04 (×8): qty 250

## 2020-12-04 MED ORDER — FENTANYL CITRATE (PF) 100 MCG/2ML IJ SOLN
INTRAMUSCULAR | Status: AC
  Administered 2020-12-04: 75 ug via INTRAVENOUS
  Filled 2020-12-04: qty 2

## 2020-12-04 MED ORDER — ETOMIDATE 2 MG/ML IV SOLN
INTRAVENOUS | Status: AC
  Administered 2020-12-04: 20 mg via INTRAVENOUS
  Filled 2020-12-04: qty 20

## 2020-12-04 MED ORDER — FENTANYL CITRATE (PF) 100 MCG/2ML IJ SOLN: 100.0000 ug | Freq: Once | INTRAMUSCULAR | Status: AC

## 2020-12-04 MED ORDER — CHLORHEXIDINE GLUCONATE 0.12 % MT SOLN
15.0000 mL | Freq: Two times a day (BID) | OROMUCOSAL | Status: DC
  Administered 2020-12-04: 15 mL via OROMUCOSAL

## 2020-12-04 MED ORDER — ROCURONIUM BROMIDE 10 MG/ML (PF) SYRINGE: 100.0000 mg | PREFILLED_SYRINGE | Freq: Once | INTRAVENOUS | Status: AC

## 2020-12-04 MED ORDER — MIDAZOLAM HCL 2 MG/2ML IJ SOLN
INTRAMUSCULAR | Status: AC
  Filled 2020-12-04: qty 2

## 2020-12-04 MED ORDER — FENTANYL CITRATE (PF) 100 MCG/2ML IJ SOLN
75.0000 ug | Freq: Once | INTRAMUSCULAR | Status: AC
Start: 2020-12-05 — End: 2020-12-04

## 2020-12-04 MED ORDER — ETOMIDATE 2 MG/ML IV SOLN: 0.3000 mg/kg | Freq: Once | INTRAVENOUS | Status: AC

## 2020-12-04 MED ORDER — CHLORHEXIDINE GLUCONATE CLOTH 2 % EX PADS
6.0000 | MEDICATED_PAD | Freq: Every day | CUTANEOUS | Status: DC
  Administered 2020-12-04 – 2020-12-21 (×18): 6 via TOPICAL

## 2020-12-04 MED ORDER — KETAMINE HCL 50 MG/5ML IJ SOSY
PREFILLED_SYRINGE | INTRAMUSCULAR | Status: AC
  Filled 2020-12-04: qty 5

## 2020-12-04 MED ORDER — CHLORHEXIDINE GLUCONATE 0.12% ORAL RINSE (MEDLINE KIT)
15.0000 mL | Freq: Two times a day (BID) | OROMUCOSAL | Status: DC
  Administered 2020-12-04 – 2020-12-11 (×14): 15 mL via OROMUCOSAL

## 2020-12-04 MED ORDER — ROCURONIUM BROMIDE 10 MG/ML (PF) SYRINGE
PREFILLED_SYRINGE | INTRAVENOUS | Status: AC
  Administered 2020-12-04: 100 mg via INTRAVENOUS
  Filled 2020-12-04: qty 10

## 2020-12-04 MED ORDER — ORAL CARE MOUTH RINSE
15.0000 mL | Freq: Two times a day (BID) | OROMUCOSAL | Status: DC
  Administered 2020-12-04: 15 mL via OROMUCOSAL

## 2020-12-04 MED ORDER — ORAL CARE MOUTH RINSE
15.0000 mL | OROMUCOSAL | Status: DC
  Administered 2020-12-05 – 2020-12-11 (×66): 15 mL via OROMUCOSAL

## 2020-12-04 MED ORDER — IPRATROPIUM-ALBUTEROL 0.5-2.5 (3) MG/3ML IN SOLN
3.0000 mL | RESPIRATORY_TRACT | Status: DC
  Administered 2020-12-05 – 2020-12-07 (×13): 3 mL via RESPIRATORY_TRACT
  Filled 2020-12-04 (×12): qty 3

## 2020-12-04 NOTE — Progress Notes (Signed)
Initial Nutrition Assessment  DOCUMENTATION CODES:   Obesity unspecified  INTERVENTION:   Once pt is out of TPA window, recommend placement of NG tube and initiation of enteral nutrition: - Osmolite 1.5 @ 60 ml/hr (1440 ml/day) - ProSource TF 45 ml daily  Recommended tube feeding regimen would provide 2200 kcal, 101 grams of protein, and 1097 ml of H2O.   NUTRITION DIAGNOSIS:   Inadequate oral intake related to inability to eat as evidenced by NPO status.  GOAL:   Patient will meet greater than or equal to 90% of their needs  MONITOR:   Diet advancement, Labs, Weight trends, I & O's  REASON FOR ASSESSMENT:   Rounds    ASSESSMENT:   70 year old male who presented to the ED on 6/09 with SOB. 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 Pt found to have NSTEMI and influenza A. Pt developed R facial droop, R arm drift and hand grip weakness along with R leg weakness and pt likely with CVA s/p tPA.  Discussed pt with RN and during ICU rounds. Pt did not pass SLP swallow evaluation. Unfortunately, pt is not out of tPA window and therefore cannot have Cortrak placed during service hours today. Once pt out of tPA window, recommend nursing place NG tube at bedside and confirm placement with abdominal x-ray. Recommend starting enteral nutrition at that time. RD will leave tube feeding recommendations. Discussed the above with RN.  Spoke with pt's two sisters at bedside. Pt able to communicate somewhat with head nods and head shakes. Majority of history was obtained from pt's sisters.  Pt's sisters state that they just got back into the country after traveling overseas and had been on a flight that was about 24 hours long. Pt ate a good lunch and dinner on the plane and ate a good meal once plane landed. They report that overseas he was hospitalized for a few days but had a great appetite during hospitalization and ate well.  Pt's sisters state that pt has not had any weight  loss recently. Pt agrees with this statement. Reviewed weight history in chart. Pt with a 3.5 kg weight loss since 10/20/20. This is a 3.4% weight loss in 1.5 months which is not significant for timeframe.  Medications reviewed and include: tamiflu, IV protonix IVF: NS @ 75 ml/hr  Labs reviewed: HDL 37 CBG's: 162-193 x 24 hours  NUTRITION - FOCUSED PHYSICAL EXAM:  Flowsheet Row Most Recent Value  Orbital Region No depletion  Upper Arm Region Mild depletion  Thoracic and Lumbar Region No depletion  Buccal Region No depletion  Temple Region Mild depletion  Clavicle Bone Region Mild depletion  Clavicle and Acromion Bone Region Mild depletion  Scapular Bone Region No depletion  Dorsal Hand No depletion  Patellar Region No depletion  Anterior Thigh Region Mild depletion  Posterior Calf Region Mild depletion  Edema (RD Assessment) None  Hair Reviewed  Eyes Reviewed  Mouth Reviewed  Skin Reviewed  Nails Reviewed       Diet Order:   Diet Order             Diet NPO time specified  Diet effective now                   EDUCATION NEEDS:   No education needs have been identified at this time  Skin:  Skin Assessment: Reviewed RN Assessment  Last BM:  no documented BM  Height:   Ht Readings from  Last 1 Encounters:  12/03/20 5\' 9"  (1.753 m)    Weight:   Wt Readings from Last 1 Encounters:  12/04/20 100.8 kg    BMI:  Body mass index is 32.82 kg/m.  Estimated Nutritional Needs:   Kcal:  2200-2400  Protein:  100-125 grams  Fluid:  >/= 2.0 L    02/03/21, MS, RD, LDN Inpatient Clinical Dietitian Please see [facility] for contact information.

## 2020-12-04 NOTE — Progress Notes (Addendum)
PT Cancellation Note  Patient Details Name: Brandon Daniel MRN: [id number] DOB: 1950/07/31   Cancelled Treatment:    Reason Eval/Treat Not Completed: Active bedrest order. Pt admitted for influenza. Code stroke called after admission (6/9 2200). Strict bedrest order 6/9 2310. Please update activity order, when appropriate, for PT to proceed with eval. Thank you.   Ilda Foil 12/04/2020, 7:53 AM  Aida Raider, PT  Office # [phone] Pager [phone]

## 2020-12-04 NOTE — Evaluation (Signed)
Speech Language Pathology Evaluation Patient Details Name: Brandon Daniel MRN: [id number] DOB: 1950/11/13 Today's Date: 12/04/2020 Time: 2778-2423 SLP Time Calculation (min) (ACUTE ONLY): 17 min  Problem List:  Patient Active Problem List   Diagnosis Date Noted   NSTEMI (non-ST elevated myocardial infarction) (HCC) 12/03/2020   Influenza A 12/03/2020   CAD (coronary artery disease) 12/03/2020   Asthma 12/03/2020   Essential hypertension 12/03/2020   Hyperlipidemia 12/03/2020   Dyspnea 12/03/2020   Pleural effusion on left 12/03/2020   Stroke (cerebrum) (HCC) 12/03/2020   Lumbosacral disc herniation    Chest pain 10/20/2020   Past Medical History:  Past Medical History:  Diagnosis Date   Asthma-COPD overlap syndrome (HCC)    CAD (coronary artery disease)    HLD (hyperlipidemia)    HTN (hypertension)    Lumbar disc herniation with radiculopathy    Lumbar stenosis    Osteoporosis    Past Surgical History:  Past Surgical History:  Procedure Laterality Date   CATARACT EXTRACTION, BILATERAL  2021   CORONARY ARTERY BYPASS GRAFT  2005   INGUINAL HERNIA REPAIR  1998   LUMBAR DISC SURGERY  2019   HPI:  Pt is a 70 year old male who was admitted for SOB from influenza A on 6/9 and developed R face and R sided weakness. Pt received tPA after normal head CT and subsequently exhibited worsening responsiveness; repeat CT on that date was still negative for acute changes. CTA: Extensive bulky calcified plaque about the left carotid bulb/proximal left ICA with associated severe near occlusive stenosis. Left ICA, MCA, and ACA markedly attenuated but patent distally. 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 Pt was able to consistently follow 1-step commands, accurately respond to some simple yes/no questions,  and demonstrate reading comprehension at the word level; however, he exhibited difficulty beyond these levels. No verbal output was noted during the evaluation despite SLP's prompts, cues, or effort to facilitate production of spontaneous phrases. Pt opened his mouth once and appeared to attempt to speak, but no other attempts were observed. SLP questions potential difficulty with motor planning. Pt accepted the pen during assessment of written expression, but he consistently placed it in his mouth and attempted to write by manipulating it with his teeth. Skilled SLP services are clinically indicated at this time to target language and for diagnostic treatment of pt's motor speech skills.    SLP Assessment  SLP Recommendation/Assessment: Patient needs continued Speech Lanaguage Pathology Services SLP Visit Diagnosis: Aphasia (R47.01)    Follow Up Recommendations  Inpatient Rehab    Frequency and Duration min 2x/week  2 weeks      SLP Evaluation Cognition  Overall Cognitive Status: Difficult to assess       Comprehension  Auditory Comprehension Overall Auditory Comprehension: Impaired Yes/No Questions: Impaired Basic Immediate Environment Questions:  (3/5) Complex Questions:  (2/5) Commands: Impaired One Step Basic Commands:  (5/5) Two Step Basic Commands:  (0/5) Multistep Basic Commands:  (0/3) Reading Comprehension Reading Status: Impaired Word level:  (5/5) Sentence Level: Impaired (1/4)    Expression Expression Primary Mode of Expression: Verbal Verbal Expression Overall Verbal Expression: Impaired Initiation: Impaired Automatic Speech:  (Counting: 0/10; moved his fingers, but no verbalization) Repetition: Impaired Level of Impairment: Word level Naming: Impairment Confrontation: Impaired (0/5)   Oral / Motor  Oral Motor/Sensory Function Overall Oral Motor/Sensory Function:  (  difficulty to assess) Motor Speech Overall Motor Speech:  Brandon Daniel)   Brandon Daniel I. Brandon Clock, MS,  CCC-SLP Acute Rehabilitation Services Office number [phone] Pager [phone]                     Brandon Daniel 12/04/2020, 11:55 AM

## 2020-12-04 NOTE — Progress Notes (Signed)
eLink Physician-Brief Progress Note Patient Name: Brandon Daniel DOB: 1950-07-12 MRN: [id number]   Date of Service  12/04/2020  HPI/Events of Note  Patient hospitalized with influenza A, then received TPA for suspected CVA following which he had an abrupt decline in his mental status raising fears of an intracranial hemorrhage but stat CT did not show ICH, etiology of patient's change in mental status is therefore not clear at this time, NEUROLOGY IS FOLLOWING THE PATIENT.  eICU Interventions  New Patient Evaluation completed.        Thomasene Lot Rosealyn Little 12/04/2020, 2:59 AM

## 2020-12-04 NOTE — Progress Notes (Signed)
Spoke w/ MRI staff who state MRI will most likely not be completed until later today.  Spoke w/ Dr. Pearlean Brownie to relay.

## 2020-12-04 NOTE — Progress Notes (Addendum)
Pt not managing secretions well, choked on small amount of water. Spoke w/ neuro MD to relay. Will place order for ST eval, most likely will need cortrak once out of TPA window.

## 2020-12-04 NOTE — TOC Initial Note (Signed)
Transition of Care [facility]) - Initial/Assessment Note    Patient Details  Name: Brandon Daniel MRN: [id number] Date of Birth: 03/17/1951  Transition of Care [facility]) CM/SW Contact:    Lockie Pares, RN Phone Number: 12/04/2020, 2:11 PM  Clinical Narrative:                 Admitted k6/9 to ICU with Flu A, then had stroke like symptoms, received TPA after code stroke called. CT done,MRI will be done later, speech consult performed, recommendation for IP rehab. On airborne isolation for Flu, will have to be out of isolation window before going to rehab. Patient has Generic Medicare advantage, unsure of insurance acceptance.from IP rehab. Next step will be SNF. He currently lives with Nephew. CM/CSW will follow for needs  Expected Discharge Plan: IP Rehab Facility Barriers to Discharge: Continued Medical Work up   Patient Goals and CMS Choice        Expected Discharge Plan and Services Expected Discharge Plan: IP Rehab Facility In-house Referral: Clinical Social Work   Post Acute Care Choice: IP Rehab Living arrangements for the past 2 months: Single Family Home                                      Prior Living Arrangements/Services Living arrangements for the past 2 months: Single Family Home Lives with:: Relatives Patient language and need for interpreter reviewed:: Yes        Need for Family Participation in Patient Care: Yes (Comment) Care giver support system in place?: Yes (comment)   Criminal Activity/Legal Involvement Pertinent to Current Situation/Hospitalization: No - Comment as needed  Activities of Daily Living      Permission Sought/Granted                  Emotional Assessment       Orientation: : Fluctuating Orientation (Suspected and/or reported Sundowners) Alcohol / Substance Use: Not Applicable Psych Involvement: No (comment)  Admission diagnosis:  NSTEMI (non-ST elevated myocardial infarction) (HCC) [I21.4] Acute respiratory failure  with hypoxia (HCC) [J96.01] Stroke (cerebrum) (HCC) [I63.9] Patient Active Problem List   Diagnosis Date Noted   NSTEMI (non-ST elevated myocardial infarction) (HCC) 12/03/2020   Influenza A 12/03/2020   CAD (coronary artery disease) 12/03/2020   Asthma 12/03/2020   Essential hypertension 12/03/2020   Hyperlipidemia 12/03/2020   Dyspnea 12/03/2020   Pleural effusion on left 12/03/2020   Stroke (cerebrum) (HCC) 12/03/2020   Lumbosacral disc herniation    Chest pain 10/20/2020   PCP:  Pcp, No Pharmacy:   [id number] - [place], [place] - [address] AT [facility] OF [address] [place] [address] Phone: [phone] Fax: [phone]     Social Determinants of Health (SDOH) Interventions    Readmission Risk Interventions No flowsheet data found.

## 2020-12-04 NOTE — Progress Notes (Signed)
  Pt's NOK, nephew Kandis Mannan, contacted and updated on change in patient's condition and intubation.  Asked if he could visit briefly.  Discussed with 45M charge, Leighton Parody, RN, is allowed to visit briefly.       Posey Boyer, ACNP [facility] 12/04/2020, 11:57 PM

## 2020-12-04 NOTE — Consult Note (Signed)
NAME:  Brandon Daniel, MRN:  [id number], DOB:  1951-06-14, LOS: 1 ADMISSION DATE:  12/03/2020, CONSULTATION DATE:  12/04/20 REFERRING MD:  Derry Lory, CHIEF COMPLAINT:  weakness   History of Present Illness:  70 year old man with hx of CAD, HTN, HLD admitted for SOB from influenza A on 12/03/20.  Developed R face and R sided weakness this evening and received TPA after normal head CT.  After this he developed some worsening responsiveness so another CT ordered which looks fine. PCCM consulted to monitor airway.  Pertinent  Medical History  Asthma/COPD overlad CAD HLD HTN  Significant Hospital Events: Including procedures, antibiotic start and stop dates in addition to other pertinent events   6/9 admitted 6/9 evening code stroke, tpa, PCCM eval  Interim History / Subjective:  consulted  Objective   Blood pressure (!) 126/105, pulse 79, temperature (!) 100.9 F (38.3 C), temperature source Axillary, resp. rate (!) 24, height 5\' 9"  (1.753 m), weight 99.2 kg, SpO2 96 %.    FiO2 (%):  [100 %] 100 %   Intake/Output Summary (Last 24 hours) at 12/04/2020 0132 Last data filed at 12/03/2020 2100 Gross per 24 hour  Intake 360.72 ml  Output 300 ml  Net 60.72 ml   Filed Weights   12/03/20 0323 12/03/20 1400  Weight: 102.1 kg 99.2 kg    Examination: General: no acute distress HENT: pupils equal, reactive Lungs: clear, mildly tachypneic Cardiovascular: RRR, ext warm Abdomen: soft, +BS Extremities: no edema Neuro: R ptosis, right sided neglect and weakness, L moves briskly to command Skin: no rashes  Labs/imaging that I havepersonally reviewed  (right click and "Reselect all SmartList Selections" daily)  VBG looks good, no signs of impending respiratory compromise BMP/CBC look good CXR L effusion  Resolved Hospital Problem list   N/a  Assessment & Plan:  AMS in context of likely CVA- improved by time of my evaluation, protecting airway, VBG looks fine Acute hypoxemia from  Influenza A L pleural effusion Hx asthma not in flare  - Tamiflu/nebs are fine - No need for intubation at present - Do not think draining left pleural space would be helpful at present; may be worth taking a look when he is >24h post tPA - Further stroke workup per stroke team - We will follow with you  Best practice (right click and "Reselect all SmartList Selections" daily)  Per primary  Labs   CBC: Recent Labs  Lab 12/03/20 0247  WBC 11.0*  HGB 11.9*  HCT 38.3*  MCV 92.7  PLT 189    Basic Metabolic Panel: Recent Labs  Lab 12/03/20 0247  NA 134*  K 4.1  CL 100  CO2 25  GLUCOSE 182*  BUN 14  CREATININE 0.74  CALCIUM 9.0   GFR: Estimated Creatinine Clearance: 104.1 mL/min (by C-G formula based on SCr of 0.74 mg/dL). Recent Labs  Lab 12/03/20 0247  WBC 11.0*    Liver Function Tests: No results for input(s): AST, ALT, ALKPHOS, BILITOT, PROT, ALBUMIN in the last 168 hours. No results for input(s): LIPASE, AMYLASE in the last 168 hours. No results for input(s): AMMONIA in the last 168 hours.  ABG    Component Value Date/Time   HCO3 27.7 12/04/2020 0056   O2SAT 92.2 12/04/2020 0056     Coagulation Profile: No results for input(s): INR, PROTIME in the last 168 hours.  Cardiac Enzymes: No results for input(s): CKTOTAL, CKMB, CKMBINDEX, TROPONINI in the last 168 hours.  HbA1C: Hgb A1c MFr Bld  Date/Time Value Ref Range Status  10/20/2020 10:11 PM 5.5 4.8 - 5.6 % Final    Comment:    (NOTE) Pre diabetes:          5.7%-6.4%  Diabetes:              >6.4%  Glycemic control for   <7.0% adults with diabetes     CBG: Recent Labs  Lab 12/03/20 2215 12/03/20 2340 12/04/20 0050  GLUCAP 193* 162* 163*    Review of Systems:    Positive Symptoms in bold:  Constitutional fevers, chills, weight loss, fatigue, anorexia, malaise  Eyes decreased vision, double vision, eye irritation  Ears, Nose, Mouth, Throat sore throat, trouble swallowing, sinus  congestion  Cardiovascular chest pain, paroxysmal nocturnal dyspnea, lower ext edema, palpitations   Respiratory SOB, cough, DOE, hemoptysis, wheezing  Gastrointestinal nausea, vomiting, diarrhea  Genitourinary burning with urination, trouble urinating  Musculoskeletal joint aches, joint swelling, back pain  Integumentary  rashes, skin lesions  Neurological focal weakness, focal numbness, trouble speaking, headaches  Psychiatric depression, anxiety, confusion  Endocrine polyuria, polydipsia, cold intolerance, heat intolerance  Hematologic abnormal bruising, abnormal bleeding, unexplained nose bleeds  Allergic/Immunologic recurrent infections, hives, swollen lymph nodes     Past Medical History:  He,  has a past medical history of Asthma-COPD overlap syndrome (HCC), CAD (coronary artery disease), HLD (hyperlipidemia), HTN (hypertension), Lumbar disc herniation with radiculopathy, Lumbar stenosis, and Osteoporosis.   Surgical History:   Past Surgical History:  Procedure Laterality Date   CATARACT EXTRACTION, BILATERAL  2021   CORONARY ARTERY BYPASS GRAFT  2005   INGUINAL HERNIA REPAIR  1998   LUMBAR DISC SURGERY  2019     Social History:   reports that he has quit smoking. His smoking use included cigarettes. He has a 60.00 pack-year smoking history. He has never used smokeless tobacco. He reports that he does not drink alcohol and does not use drugs.   Family History:  His family history includes CAD in his father; Cancer in his mother; Colon cancer in an other family member; Diabetes in an other family member; Prostate cancer in an other family member.   Allergies No Known Allergies   Home Medications  Prior to Admission medications   Medication Sig Start Date End Date Taking? Authorizing Provider  albuterol (VENTOLIN HFA) 108 (90 Base) MCG/ACT inhaler Inhale 1 puff into the lungs every 6 (six) hours as needed for wheezing or shortness of breath.   Yes [provider]  alendronate (FOSAMAX) 70 MG tablet Take 70 mg by mouth once a week. Take with a full glass of water on an empty stomach.   Yes [provider]  aspirin EC 81 MG tablet Take 81 mg by mouth in the morning and at bedtime. Swallow whole.   Yes [provider]  atenolol (TENORMIN) 100 MG tablet Take 100 mg by mouth daily.   Yes [provider]  atorvastatin (LIPITOR) 80 MG tablet Take 80 mg by mouth daily.   Yes [provider]  Cholecalciferol (VITAMIN D) 50 MCG (2000 UT) tablet Take 2,000 Units by mouth daily.   Yes [provider]  clopidogrel (PLAVIX) 75 MG tablet Take 75 mg by mouth daily.   Yes [provider]  ezetimibe (ZETIA) 10 MG tablet Take 10 mg by mouth daily.   Yes [provider]  gabapentin (NEURONTIN) 600 MG tablet Take 600 mg by mouth 2 (two) times daily.   Yes [provider]  hydrochlorothiazide (  HYDRODIURIL) 25 MG tablet Take 25 mg by mouth daily.   Yes [provider]  lisinopril (ZESTRIL) 20 MG tablet Take 20 mg by mouth daily.   Yes [provider]  montelukast (SINGULAIR) 10 MG tablet Take 10 mg by mouth at bedtime.   Yes [provider]  nitroGLYCERIN (NITROSTAT) 0.4 MG SL tablet Place 0.4 mg under the tongue every 5 (five) minutes as needed for chest pain.   Yes [provider]  Omega-3 Fatty Acids (FISH OIL) 1000 MG CPDR Take 1,000 mg by mouth daily.   Yes [provider]  Polyvinyl Alcohol-Povidone (REFRESH OP) Place 1 drop into both eyes daily.   Yes [provider]  Tiotropium Bromide-Olodaterol (STIOLTO RESPIMAT) 2.5-2.5 MCG/ACT AERS Inhale 2 puffs into the lungs daily.   Yes [provider]

## 2020-12-04 NOTE — Progress Notes (Signed)
RN spoke w/ pts nephew Kandis Mannan as well as pts niece to provide updates.

## 2020-12-04 NOTE — Progress Notes (Signed)
Carotid artery duplex has been completed. Preliminary results can be found in CV Proc through chart review.   12/04/20 1:25 PM Olen Cordial RVT

## 2020-12-04 NOTE — Progress Notes (Addendum)
NAME:  Brandon Daniel, MRN:  [id number], DOB:  09-05-1950, LOS: 1 ADMISSION DATE:  12/03/2020, CONSULTATION DATE:  12/04/20 REFERRING MD:  Derry Lory, CHIEF COMPLAINT:  weakness   History of Present Illness:  71 year old man with hx of CAD, HTN, HLD admitted for SOB from influenza A on 12/03/20.  Developed R face and R sided weakness this evening and received TPA after normal head CT.  After this he developed some worsening responsiveness so another CT ordered which looks fine. PCCM consulted to monitor airway.  Pertinent  Medical History  Asthma/COPD overlad CAD HLD HTN  Significant Hospital Events: Including procedures, antibiotic start and stop dates in addition to other pertinent events   6/9 admitted 6/9 evening code stroke, tpa, PCCM eval  Interim History / Subjective:  consulted  Objective   Blood pressure (!) 134/51, pulse 71, temperature (!) 100.7 F (38.2 C), temperature source Axillary, resp. rate (!) 24, height 5\' 9"  (1.753 m), weight 100.8 kg, SpO2 97 %.    FiO2 (%):  [100 %] 100 %   Intake/Output Summary (Last 24 hours) at 12/04/2020 0959 Last data filed at 12/04/2020 0400 Gross per 24 hour  Intake 484.86 ml  Output 450 ml  Net 34.86 ml   Filed Weights   12/03/20 0323 12/03/20 1400 12/04/20 0500  Weight: 102.1 kg 99.2 kg 100.8 kg    Examination: General: no acute distress HENT: pupils equal, reactive Lungs: clear, mildly tachypneic Cardiovascular: RRR, ext warm Abdomen: soft, +BS Extremities: no edema Neuro: R ptosis, right sided neglect and weakness, L moves briskly to command Skin: no rashes  Labs/imaging that I havepersonally reviewed  (right click and "Reselect all SmartList Selections" daily)  VBG looks good, no signs of impending respiratory compromise BMP/CBC look good CXR L effusion  Resolved Hospital Problem list   N/a  Assessment & Plan:  Altered mental status in setting of cerebral infarction status post tPA per stroke team. tPA for  stroke given per neuro Neurology is following Does not seem significantly different in mental status post tPA.   Concern for airway impairment status post cerebral infarction. Currently in ICU He is protecting his airway He has the propensity to decline he may need intubation in future   Flu A positive Continue Tamiflu Continue to monitor in intensive care unit at this time  Left pleural effusion Monitor Status post tPA  Nutrition Unable to tolerate oral feedings at this time May need  cortrak   Best practice (right click and "Reselect all SmartList Selections" daily)  Per primary  Labs   CBC: Recent Labs  Lab 12/03/20 0247  WBC 11.0*  HGB 11.9*  HCT 38.3*  MCV 92.7  PLT 189    Basic Metabolic Panel: Recent Labs  Lab 12/03/20 0247  NA 134*  K 4.1  CL 100  CO2 25  GLUCOSE 182*  BUN 14  CREATININE 0.74  CALCIUM 9.0   GFR: Estimated Creatinine Clearance: 104.8 mL/min (by C-G formula based on SCr of 0.74 mg/dL). Recent Labs  Lab 12/03/20 0247  WBC 11.0*    Liver Function Tests: No results for input(s): AST, ALT, ALKPHOS, BILITOT, PROT, ALBUMIN in the last 168 hours. No results for input(s): LIPASE, AMYLASE in the last 168 hours. No results for input(s): AMMONIA in the last 168 hours.  ABG    Component Value Date/Time   HCO3 27.7 12/04/2020 0056   O2SAT 92.2 12/04/2020 0056     Coagulation Profile: No results for input(s): INR, PROTIME in  the last 168 hours.  Cardiac Enzymes: No results for input(s): CKTOTAL, CKMB, CKMBINDEX, TROPONINI in the last 168 hours.  HbA1C: Hgb A1c MFr Bld  Date/Time Value Ref Range Status  10/20/2020 10:11 PM 5.5 4.8 - 5.6 % Final    Comment:    (NOTE) Pre diabetes:          5.7%-6.4%  Diabetes:              >6.4%  Glycemic control for   <7.0% adults with diabetes     CBG: Recent Labs  Lab 12/03/20 2215 12/03/20 2340 12/04/20 0050  GLUCAP 193* 162* 163*     App cct 30 min  Brett Canales Minor  ACNP Acute Care Nurse Practitioner [facility] Please consult Amion 12/04/2020, 10:00 AM  ---------------------------  Attending note: I have seen and examined the patient. History, labs and imaging reviewed.  70 year old with coronary artery disease, hypertension, hyperlipidemia admitted with flu a, received tPA after he developed acute right face and right sided weakness.  Then developed unresponsiveness.  Transferred to ICU after repeat CTA for monitoring  Blood pressure (!) 134/51, pulse 71, temperature (!) 101.4 F (38.6 C), temperature source Oral, resp. rate (!) 24, height 5\' 9"  (1.753 m), weight 100.8 kg, SpO2 97 %. Gen:      No acute distress HEENT:  EOMI, sclera anicteric Neck:     No masses; no thyromegaly Lungs:    Clear to auscultation bilaterally; normal respiratory effort CV:         Regular rate and rhythm; no murmurs Abd:      + bowel sounds; soft, non-tender; no palpable masses, no distension Ext:    No edema; adequate peripheral perfusion Skin:      Warm and dry; no rash Neuro: Awake, right-sided weakness  Labs/Imaging personally reviewed, significant for BUN/creatinine 14/0.74, WBC 11, platelets 189 Chest x-ray with trace left effusion  Assessment/plan: Stroke s/p tPA Continue neuro monitoring  Altered mental status, flu A Small left effusion Appears to be protecting his airway, continue monitoring  The patient is critically ill with multiple organ systems failure and requires high complexity decision making for assessment and support, frequent evaluation and titration of therapies, application of advanced monitoring technologies and extensive interpretation of multiple databases.  Critical care time - 15 mins. This represents my time independent of the NPs time taking care of the pt.  11-18-1975 MD [facility] 12/04/2020, 10:16 AM

## 2020-12-04 NOTE — Evaluation (Signed)
Clinical/Bedside Swallow Evaluation Patient Details  Name: Brandon Daniel MRN: [id number] Date of Birth: 11/12/1950  Today's Date: 12/04/2020 Time: SLP Start Time (ACUTE ONLY): 1042 SLP Stop Time (ACUTE ONLY): 1056 SLP Time Calculation (min) (ACUTE ONLY): 14.5 min  Past Medical History:  Past Medical History:  Diagnosis Date   Asthma-COPD overlap syndrome (HCC)    CAD (coronary artery disease)    HLD (hyperlipidemia)    HTN (hypertension)    Lumbar disc herniation with radiculopathy    Lumbar stenosis    Osteoporosis    Past Surgical History:  Past Surgical History:  Procedure Laterality Date   CATARACT EXTRACTION, BILATERAL  2021   CORONARY ARTERY BYPASS GRAFT  2005   INGUINAL HERNIA REPAIR  1998   LUMBAR DISC SURGERY  2019   HPI:  Pt is a 70 year old male who was admitted for SOB from influenza A on 6/9 and developed R face and R sided weakness. Pt received tPA after normal head CT and subsequently exhibited worsening responsiveness; repeat CT on that date was still negative for acute changes. CTA: Extensive bulky calcified plaque about the left carotid bulb/proximal left ICA with associated severe near occlusive stenosis. Left ICA, MCA, and ACA markedly attenuated but patent distally. PMH: CAD, HTN, HLD   Assessment / Plan / Recommendation Clinical Impression  Pt was seen for bedside swallow evaluation. Oral mechanism exam was limited due to pt's difficulty following commands; however, right-sided facial weakness was noted. Pt presented with adequate natural dentition. He demonstrated symptoms of oropharyngeal dysphagia characterized by right-sided anterior spillage, multiple swallows, and signs of aspiration across all consistencies. Coughing was noted at baseline, and the possibility of at least some instances of coughing being unrelated to swallowing is considered. Frequency of coughing was reduced with honey thick liquids via cup and eliminated with tsp-sized boluses. Coughing  was inconsistent with puree boluses and did not appear to be impacted by bolus size. It is recommended that the pt's NPO status be maintained with allowance of ice chips after oral care. Pt's RN has advised that Cortrak cannot be placed today since it has been <24 hrs since tPA administration. Considering pt's inconsistency with puree, if there is a very critical medication which cannot be switched to IV, it may be crushed and given in limited quantities of puree. SLP will follow closely to assess improvement in swallow function and for modified barium swallow study if pt's symptoms persist. SLP Visit Diagnosis: Dysphagia, oropharyngeal phase (R13.12)    Aspiration Risk  Moderate aspiration risk;Severe aspiration risk;Risk for inadequate nutrition/hydration    Diet Recommendation NPO;Alternative means - temporary   Medication Administration: Via alternative means Postural Changes: Seated upright at 90 degrees    Other  Recommendations Oral Care Recommendations: Oral care BID   Follow up Recommendations Inpatient Rehab      Frequency and Duration min 2x/week  2 weeks       Prognosis Prognosis for Safe Diet Advancement: Good Barriers to Reach Goals: Severity of deficits;Language deficits      Swallow Study   General Date of Onset: 12/03/20 HPI: Pt is a 70 year old male who was admitted for SOB from influenza A on 6/9 and developed R face and R sided weakness. Pt received tPA after normal head CT and subsequently exhibited worsening responsiveness; repeat CT on that date was still negative for acute changes. CTA: Extensive bulky calcified plaque about the left carotid bulb/proximal left ICA with associated severe near occlusive stenosis. Left ICA, MCA, and  ACA markedly attenuated but patent distally. PMH: CAD, HTN, HLD Type of Study: Bedside Swallow Evaluation Previous Swallow Assessment: none Diet Prior to this Study: NPO Temperature Spikes Noted: No Respiratory Status: Nasal  cannula History of Recent Intubation: No Behavior/Cognition: Cooperative;Pleasant mood Oral Cavity Assessment: Within Functional Limits Oral Care Completed by SLP: Recent completion by staff Oral Cavity - Dentition: Adequate natural dentition Vision: Functional for self-feeding Self-Feeding Abilities: Needs assist Patient Positioning: Upright in bed;Postural control adequate for testing Baseline Vocal Quality: Not observed Volitional Cough: Congested;Strong Volitional Swallow: Unable to elicit    Oral/Motor/Sensory Function Overall Oral Motor/Sensory Function:  (difficulty to assess)   Ice Chips Ice chips: Within functional limits Presentation: Spoon   Thin Liquid Thin Liquid: Impaired Presentation: Spoon;Cup Oral Phase Impairments: Reduced labial seal Oral Phase Functional Implications: Right anterior spillage Pharyngeal  Phase Impairments: Throat Clearing - Immediate    Nectar Thick Nectar Thick Liquid: Not tested   Honey Thick Honey Thick Liquid: Impaired Presentation: Cup;Spoon;Straw Oral Phase Impairments: Reduced labial seal Oral Phase Functional Implications: Right anterior spillage Pharyngeal Phase Impairments: Cough - Immediate   Puree Puree: Impaired Presentation: Spoon Oral Phase Impairments: Reduced labial seal Pharyngeal Phase Impairments: Cough - Immediate;Multiple swallows   Solid     Solid: Not tested     Ahmira Boisselle I. Vear Clock, MS, CCC-SLP Acute Rehabilitation Services Office number [phone] Pager [phone]  Scheryl Marten 12/04/2020,11:37 AM

## 2020-12-04 NOTE — Progress Notes (Signed)
eLink Physician-Brief Progress Note Patient Name: Brandon Daniel DOB: May 09, 1951 MRN: [id number]   Date of Service  12/04/2020  HPI/Events of Note  Patient with severe respiratory distress and desaturation, his work of breathing  is also excessive with imminent acute respiratory failure.  eICU Interventions  Ground crew requested to see the patient  for intubation.        Thomasene Lot Eiden Bagot 12/04/2020, 11:11 PM

## 2020-12-04 NOTE — Progress Notes (Addendum)
Brief Neuro Update:  Notifid by RN about change in mentation. On my evaluation much less responsive and does not follow any commands.  Vital with fever(but was present before stroke code and known influenza A positive) normal otherwise with BP 130s/80s, Satting 90+% on 4LNC, HR in 70s, glucose of 160s. No opiods or sedating meds given during this admission  Exam: Grimaces to sternal rub and to nares stimulation. Pupils 62mm BL, euqual round and reactive. Corneals +, Cough +, weak gag. Mouth open. Spontaneous movement in LUE and LLE more than RUE and RLE. Does withdraw BL lower extremities to nailbed pressure. Localizes in BL upper extremities.   tPA infusion was just finished and flush was started. Flush was immediately stopped.  STAT CTH was obtained and was negative for an ICH.  Recs: - Will get STAT VBG. - PCCM team asked to evaluate this patient too.  Brandon Daniel [facility] Pager Number [phone]

## 2020-12-04 NOTE — Progress Notes (Addendum)
STROKE TEAM PROGRESS NOTE    Interval History  Overnight after IVTPA infusion was finished and flush was started patient was noted by nursing to be less responsive, unable to answer LOC questions. STAT CTH was ordered that was negative for bleed.    Pertinent Lab Work and Imaging    12/04/20 CT Head WO IV Contrast 1. No acute intracranial abnormality. 2. ASPECTS is 10.  12/03/20 CT Angio Head and Neck W WO IV Contrast 1. Negative CTA for emergent large vessel occlusion. 2. Extensive bulky calcified plaque about the left carotid bulb/proximal left ICA with associated severe near occlusive stenosis. Left ICA, MCA, and ACA markedly attenuated but remain patent distally. 3. Short-segment 70% atheromatous stenosis at the proximal cervical right ICA. 4. Severe left and moderate right vertebral artery origin stenoses.  12/04/20 MRI Brain WO IV Contrast Pending   12/04/20 Echocardiogram Complete  Pending   12/04/20 VAS US Carotid  Pending   Physical Examination   Constitutional: Calm, appropriate for condition  Cardiovascular: Normal RR Respiratory: No increased WOB   Mental status: Patient is lethargic but opens eyes partially to stimulation.  Unable to answer LOC questions due to aphasia. Follows commands ( shows two fingers, makes fist and opens hand)  Speech: Globally aphasic. Unable to name or repeat.  Cranial nerves: EOMI, VFF, Right facial weakness, Tongue midline  Motor: Normal bulk and tone. No drift. LUE 3/5 LLE 3/5 RUE 2/5 with distal> proximal strength  RLE 0/5 Sensory: Appears intact to light tough  Coordination: Deferred  Gait: Deferred due to right sided weakness   Assessment and Plan   [name] is a 70 y.o. male w/pmh of CAD, HTN, lumbar stenosis, HLD who initially presented with SOB, was found to be Influenza A + and later on day of admission developed mild R facial droop, R arm drift and hand grip weakness along with R leg weakness. He was stroke coded and  received IVTPA.   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 At this time, stroke work up is ongoing. MRI Brain is pending. CTA Head and Neck was pertinent for LICA with severe stenosis/near occlusion. Suspect L MCA stroke in the setting of atheroembolism however waiting on echo to evaluate for cardiac cause. Stroke labs were completed including Lipid panel w/LDL 55 and Hemoglobin A1C pending.  - F/U remaining stroke work up, he will need a vascular surgery consult for LICA stenosis when medically stable from a flu/respiratory standpoint  - Initiate Aspirin 81 mg 24 hours post IVTPA for secondary stroke prevention  - Continue Atorvastatin 80 mg for secondary stroke prevention  - At discharge will place ambulatory referral to neurology for stroke follow up   CARDS #CAD s/p CABG  #Hypertension #NSETMI  He has a history of HTN and takes HCTZ 25 QD, Atenolol 100 mg QD, Lisinopril 20 mg QD at home. Currently blood pressure is trending 100-140. SBP goal < 180 given patient received IVTPA. Will resume home regimen when indicated while trending pressures.   #Hyperlipidemia From a stroke prevention stand point, the LDL goal is < 70. LDL is 55 and is at goal.   RESP   #Ongoing SOB   #Asthma  #Influenza  Prior to admission he had SOB and cough. Noted to be hypoxic in the ED. Was found to be Influenza A positive( view more in ID section regarding Influenza). On 4-5 L oxygen  to maintain saturations above 90. - Continue oxygen with nasal cannula to maintain sats> 90  RENAL  Lytes and Cr WNL. Monitoring with BMP.   ENDO  #DMII Screening  Hemoglobin A1C pending   GI  #Dysphagia  SLP assessed him on 6/10, noted dysphagia and recommended he remain NPO.  - Place NGT and began enteral nutrition   HEME/ID  #Influenza A #Leukocytosis  #Fever  Screened for influenza given recent  travel to [place] and ongoing SOB prior to admission. Noted to be febrile and hypoxic this admission. Started on Tamiflu 75 BID for 5 days- 12/03/20 to 12/07/20. HHP are stable.  - Trend leukocytosis  - Continue Influenza A treatment with Tamiflu   #DVT Prophylaxis  - Initiate DVT Prophylaxis 24 hours from [facility] day # 1  Stark Jock, NP  [facility] Nurse Practitioner Patient seen and discussed with attending physician Dr. Pearlean Brownie  I have personally obtained history,examined this patient, reviewed notes, independently viewed imaging studies, participated in medical decision making and plan of care.ROS completed by me personally and pertinent positives fully documented  I have made any additions or clarifications directly to the above note. Agree with note above.  Patient presented with aphasia right hemiparesis secondary to symptomatic high-grade proximal left carotid stenosis.  He received tPA but has not shown significant improvement yet.  Continue close neurological monitoring and strict blood pressure control as per post tPA protocol.  Avoid hypotension due to severe carotid stenosis.  Check MRI scan of the brain later today.  Continue management of respiratory status for his influenza as per critical care team.  Patient will need elective left carotid revascularization after medical stability of his respiratory status and recovery from stroke and rehab.  Few weeks.  Discussed with patient and Dr. Isaiah Serge critical care medicine This patient is critically ill and at significant risk of neurological worsening, death and care requires constant monitoring of vital signs, hemodynamics,respiratory and cardiac monitoring, extensive review of multiple databases, frequent neurological assessment, discussion with family, other specialists and medical decision making of high complexity.I have made any additions or clarifications directly to the above note.This critical care time does not  reflect procedure time, or teaching time or supervisory time of PA/NP/Med Resident etc but could involve care discussion time.  I spent 30 minutes of neurocritical care time  in the care of  this patient.      Delia Heady, MD Medical Director [facility] Pager: [phone] 12/04/2020 2:20 PM   To contact Stroke Continuity provider, please refer to [email or web address]. After hours, contact General Neurology

## 2020-12-04 NOTE — Progress Notes (Signed)
OT Cancellation Note  Patient Details Name: Brandon Daniel MRN: [id number] DOB: 14-Aug-1950   Cancelled Treatment:    Reason Eval/Treat Not Completed: Active bedrest order. Code stroke called after admission (6/9 2200). Strict bedrest order 6/9 2310. OT to proceed with evaluation once bedrest orders have been lifted.   Kallie Edward OTR/L Supplemental OT, Department of rehab services [phone]  Elspeth Blucher R H. 12/04/2020, 9:12 AM

## 2020-12-04 NOTE — Progress Notes (Signed)
2D echocardiogram completed. 12/04/2020 3:16 PM Eula Fried., MHA, RVT, RDCS, RDMS

## 2020-12-05 ENCOUNTER — Inpatient Hospital Stay (HOSPITAL_COMMUNITY): Payer: Medicare (Managed Care)

## 2020-12-05 LAB — POCT I-STAT 7, (LYTES, BLD GAS, ICA,H+H)
Acid-base deficit: 2 mmol/L (ref 0.0–2.0)
Acid-base deficit: 3 mmol/L — ABNORMAL HIGH (ref 0.0–2.0)
Bicarbonate: 25.2 mmol/L (ref 20.0–28.0)
Bicarbonate: 29 mmol/L — ABNORMAL HIGH (ref 20.0–28.0)
Calcium, Ion: 1.16 mmol/L (ref 1.15–1.40)
Calcium, Ion: 1.17 mmol/L (ref 1.15–1.40)
HCT: 43 % (ref 39.0–52.0)
HCT: 45 % (ref 39.0–52.0)
Hemoglobin: 14.6 g/dL (ref 13.0–17.0)
Hemoglobin: 15.3 g/dL (ref 13.0–17.0)
O2 Saturation: 92 %
O2 Saturation: 95 %
Patient temperature: 98.8
Patient temperature: 98.8
Potassium: 4.5 mmol/L (ref 3.5–5.1)
Potassium: 4.7 mmol/L (ref 3.5–5.1)
Sodium: 138 mmol/L (ref 135–145)
Sodium: 138 mmol/L (ref 135–145)
TCO2: 27 mmol/L (ref 22–32)
TCO2: 31 mmol/L (ref 22–32)
pCO2 arterial: 58 mmHg — ABNORMAL HIGH (ref 32.0–48.0)
pCO2 arterial: 78.2 mmHg (ref 32.0–48.0)
pH, Arterial: 7.177 — CL (ref 7.350–7.450)
pH, Arterial: 7.246 — ABNORMAL LOW (ref 7.350–7.450)
pO2, Arterial: 83 mmHg (ref 83.0–108.0)
pO2, Arterial: 93 mmHg (ref 83.0–108.0)

## 2020-12-05 LAB — BASIC METABOLIC PANEL
Anion gap: 15 (ref 5–15)
BUN: 21 mg/dL (ref 8–23)
CO2: 25 mmol/L (ref 22–32)
Calcium: 8.6 mg/dL — ABNORMAL LOW (ref 8.9–10.3)
Chloride: 99 mmol/L (ref 98–111)
Creatinine, Ser: 1.36 mg/dL — ABNORMAL HIGH (ref 0.61–1.24)
GFR, Estimated: 57 mL/min — ABNORMAL LOW (ref >60)
Glucose, Bld: 176 mg/dL — ABNORMAL HIGH (ref 70–99)
Potassium: 4.6 mmol/L (ref 3.5–5.1)
Sodium: 139 mmol/L (ref 135–145)

## 2020-12-05 LAB — CBC WITH DIFFERENTIAL/PLATELET
Abs Immature Granulocytes: 0.25 10*3/uL — ABNORMAL HIGH (ref 0.00–0.07)
Basophils Absolute: 0 10*3/uL (ref 0.0–0.1)
Basophils Relative: 0 %
Eosinophils Absolute: 0 10*3/uL (ref 0.0–0.5)
Eosinophils Relative: 0 %
HCT: 45.3 % (ref 39.0–52.0)
Hemoglobin: 13.7 g/dL (ref 13.0–17.0)
Immature Granulocytes: 1 %
Lymphocytes Relative: 3 %
Lymphs Abs: 0.6 10*3/uL — ABNORMAL LOW (ref 0.7–4.0)
MCH: 28.7 pg (ref 26.0–34.0)
MCHC: 30.2 g/dL (ref 30.0–36.0)
MCV: 94.8 fL (ref 80.0–100.0)
Monocytes Absolute: 2 10*3/uL — ABNORMAL HIGH (ref 0.1–1.0)
Monocytes Relative: 9 %
Neutro Abs: 19 10*3/uL — ABNORMAL HIGH (ref 1.7–7.7)
Neutrophils Relative %: 87 %
Platelets: 303 10*3/uL (ref 150–400)
RBC: 4.78 MIL/uL (ref 4.22–5.81)
RDW: 13.5 % (ref 11.5–15.5)
WBC: 21.8 10*3/uL — ABNORMAL HIGH (ref 4.0–10.5)
nRBC: 0 % (ref 0.0–0.2)

## 2020-12-05 LAB — HEPARIN LEVEL (UNFRACTIONATED)
Heparin Unfractionated: <0.1 IU/mL — ABNORMAL LOW (ref 0.30–0.70)
Heparin Unfractionated: 0.13 IU/mL — ABNORMAL LOW (ref 0.30–0.70)

## 2020-12-05 LAB — PROCALCITONIN: Procalcitonin: 0.95 ng/mL

## 2020-12-05 LAB — TROPONIN I (HIGH SENSITIVITY)
Troponin I (High Sensitivity): 5741 ng/L (ref <18)
Troponin I (High Sensitivity): >24000 ng/L (ref <18)
Troponin I (High Sensitivity): >24000 ng/L (ref <18)
Troponin I (High Sensitivity): >24000 ng/L (ref <18)

## 2020-12-05 LAB — GLUCOSE, CAPILLARY
Glucose-Capillary: 163 mg/dL — ABNORMAL HIGH (ref 70–99)
Glucose-Capillary: 172 mg/dL — ABNORMAL HIGH (ref 70–99)
Glucose-Capillary: 175 mg/dL — ABNORMAL HIGH (ref 70–99)
Glucose-Capillary: 190 mg/dL — ABNORMAL HIGH (ref 70–99)
Glucose-Capillary: 192 mg/dL — ABNORMAL HIGH (ref 70–99)
Glucose-Capillary: 209 mg/dL — ABNORMAL HIGH (ref 70–99)
Glucose-Capillary: 213 mg/dL — ABNORMAL HIGH (ref 70–99)

## 2020-12-05 LAB — MAGNESIUM: Magnesium: 2.3 mg/dL (ref 1.7–2.4)

## 2020-12-05 LAB — PHOSPHORUS: Phosphorus: 6.2 mg/dL — ABNORMAL HIGH (ref 2.5–4.6)

## 2020-12-05 MED ORDER — SODIUM CHLORIDE 0.9 % IV SOLN
2.0000 g | INTRAVENOUS | Status: DC
  Administered 2020-12-05 – 2020-12-07 (×3): 2 g via INTRAVENOUS
  Filled 2020-12-05 (×3): qty 20

## 2020-12-05 MED ORDER — MIDAZOLAM HCL 2 MG/2ML IJ SOLN
2.0000 mg | INTRAMUSCULAR | Status: DC | PRN
  Administered 2020-12-06 – 2020-12-10 (×6): 2 mg via INTRAVENOUS
  Filled 2020-12-05 (×6): qty 2

## 2020-12-05 MED ORDER — PHENYLEPHRINE HCL-NACL 10-0.9 MG/250ML-% IV SOLN
0.0000 ug/min | INTRAVENOUS | Status: DC
  Administered 2020-12-05: 20 ug/min via INTRAVENOUS
  Administered 2020-12-05 (×4): 200 ug/min via INTRAVENOUS
  Filled 2020-12-05: qty 500
  Filled 2020-12-05: qty 250
  Filled 2020-12-05: qty 500
  Filled 2020-12-05 (×2): qty 250

## 2020-12-05 MED ORDER — SODIUM CHLORIDE 0.9 % IV SOLN
500.0000 mg | INTRAVENOUS | Status: DC
  Administered 2020-12-05 – 2020-12-08 (×4): 500 mg via INTRAVENOUS
  Filled 2020-12-05 (×5): qty 500

## 2020-12-05 MED ORDER — NOREPINEPHRINE 16 MG/250ML-% IV SOLN
0.0000 ug/min | INTRAVENOUS | Status: DC
  Administered 2020-12-05: 40 ug/min via INTRAVENOUS
  Administered 2020-12-05: 20 ug/min via INTRAVENOUS
  Administered 2020-12-06: 6 ug/min via INTRAVENOUS
  Filled 2020-12-05 (×2): qty 250

## 2020-12-05 MED ORDER — FUROSEMIDE 10 MG/ML IJ SOLN
40.0000 mg | Freq: Once | INTRAMUSCULAR | Status: AC
  Administered 2020-12-05: 40 mg via INTRAVENOUS
  Filled 2020-12-05: qty 4

## 2020-12-05 MED ORDER — SODIUM CHLORIDE 0.9 % IV BOLUS
250.0000 mL | Freq: Once | INTRAVENOUS | Status: AC
  Administered 2020-12-05: 250 mL via INTRAVENOUS

## 2020-12-05 MED ORDER — ATORVASTATIN CALCIUM 80 MG PO TABS
80.0000 mg | ORAL_TABLET | Freq: Every day | ORAL | Status: DC
  Administered 2020-12-05 – 2020-12-25 (×21): 80 mg
  Filled 2020-12-05 (×21): qty 1

## 2020-12-05 MED ORDER — MONTELUKAST SODIUM 10 MG PO TABS
10.0000 mg | ORAL_TABLET | Freq: Every day | ORAL | Status: DC
  Administered 2020-12-05 – 2020-12-25 (×21): 10 mg
  Filled 2020-12-05 (×21): qty 1

## 2020-12-05 MED ORDER — ASPIRIN 81 MG PO CHEW
81.0000 mg | CHEWABLE_TABLET | Freq: Every day | ORAL | Status: DC
  Administered 2020-12-05: 81 mg via NASOGASTRIC
  Filled 2020-12-05 (×2): qty 1

## 2020-12-05 MED ORDER — MIDAZOLAM HCL 2 MG/2ML IJ SOLN
INTRAMUSCULAR | Status: AC
  Administered 2020-12-05: 2 mg via INTRAVENOUS
  Filled 2020-12-05: qty 4

## 2020-12-05 MED ORDER — OSELTAMIVIR PHOSPHATE 6 MG/ML PO SUSR
75.0000 mg | Freq: Two times a day (BID) | ORAL | Status: AC
  Administered 2020-12-05 – 2020-12-09 (×10): 75 mg
  Filled 2020-12-05 (×11): qty 12.5

## 2020-12-05 MED ORDER — PHENYLEPHRINE CONCENTRATED 100MG/250ML (0.4 MG/ML) INFUSION SIMPLE
0.0000 ug/min | INTRAVENOUS | Status: DC
  Filled 2020-12-05: qty 250

## 2020-12-05 MED ORDER — SODIUM CHLORIDE 0.9 % IV SOLN
2.0000 g | Freq: Once | INTRAVENOUS | Status: AC
  Administered 2020-12-05: 2 g via INTRAVENOUS
  Filled 2020-12-05: qty 20

## 2020-12-05 MED ORDER — HEPARIN (PORCINE) 25000 UT/250ML-% IV SOLN
2000.0000 [IU]/h | INTRAVENOUS | Status: DC
  Administered 2020-12-05: 1100 [IU]/h via INTRAVENOUS
  Administered 2020-12-06: 1800 [IU]/h via INTRAVENOUS
  Administered 2020-12-06: 1650 [IU]/h via INTRAVENOUS
  Administered 2020-12-07 – 2020-12-08 (×2): 1900 [IU]/h via INTRAVENOUS
  Filled 2020-12-05 (×5): qty 250

## 2020-12-05 MED ORDER — ASPIRIN 300 MG RE SUPP: 300.0000 mg | Freq: Every day | RECTAL | Status: DC

## 2020-12-05 MED ORDER — PHENYLEPHRINE HCL-NACL 10-0.9 MG/250ML-% IV SOLN
25.0000 ug/min | INTRAVENOUS | Status: DC
  Administered 2020-12-05 (×2): 200 ug/min via INTRAVENOUS
  Administered 2020-12-05 (×3): 250 ug/min via INTRAVENOUS
  Administered 2020-12-05: 200 ug/min via INTRAVENOUS
  Filled 2020-12-05: qty 250
  Filled 2020-12-05 (×3): qty 500

## 2020-12-05 MED ORDER — MIDAZOLAM HCL 2 MG/2ML IJ SOLN: 4.0000 mg | Freq: Once | INTRAMUSCULAR | Status: AC

## 2020-12-05 MED ORDER — EZETIMIBE 10 MG PO TABS
10.0000 mg | ORAL_TABLET | Freq: Every day | ORAL | Status: DC
  Administered 2020-12-05 – 2020-12-25 (×21): 10 mg
  Filled 2020-12-05 (×21): qty 1

## 2020-12-05 MED ORDER — ATENOLOL 100 MG PO TABS
100.0000 mg | ORAL_TABLET | Freq: Every day | ORAL | Status: DC
  Filled 2020-12-05 (×2): qty 1

## 2020-12-05 MED ORDER — GUAIFENESIN-DM 100-10 MG/5ML PO SYRP
5.0000 mL | ORAL_SOLUTION | ORAL | Status: DC | PRN
  Administered 2020-12-08 – 2020-12-20 (×4): 5 mL
  Filled 2020-12-05 (×3): qty 10
  Filled 2020-12-05: qty 5

## 2020-12-05 MED ORDER — SODIUM CHLORIDE 0.9 % IV BOLUS: 250.0000 mL | Freq: Once | INTRAVENOUS | Status: DC

## 2020-12-05 MED ORDER — SODIUM CHLORIDE 0.9 % IV BOLUS
1000.0000 mL | Freq: Once | INTRAVENOUS | Status: AC
  Administered 2020-12-05: 1000 mL via INTRAVENOUS

## 2020-12-05 MED ORDER — ACETAMINOPHEN 325 MG PO TABS
650.0000 mg | ORAL_TABLET | ORAL | Status: DC | PRN
  Administered 2020-12-05 – 2020-12-26 (×16): 650 mg
  Filled 2020-12-05 (×18): qty 2

## 2020-12-05 MED ORDER — CLOPIDOGREL BISULFATE 75 MG PO TABS
75.0000 mg | ORAL_TABLET | Freq: Every day | ORAL | Status: DC
  Administered 2020-12-05: 75 mg via NASOGASTRIC
  Filled 2020-12-05: qty 1

## 2020-12-05 MED ORDER — GABAPENTIN 600 MG PO TABS
600.0000 mg | ORAL_TABLET | Freq: Two times a day (BID) | ORAL | Status: DC
  Administered 2020-12-05 – 2020-12-19 (×29): 600 mg
  Filled 2020-12-05 (×30): qty 1

## 2020-12-05 MED ORDER — SODIUM CHLORIDE 0.9 % IV SOLN
250.0000 mL | INTRAVENOUS | Status: DC
  Administered 2020-12-05 – 2020-12-09 (×6): 250 mL via INTRAVENOUS

## 2020-12-05 MED ORDER — SENNOSIDES-DOCUSATE SODIUM 8.6-50 MG PO TABS
1.0000 | ORAL_TABLET | Freq: Every evening | ORAL | Status: DC | PRN
  Administered 2020-12-06 – 2020-12-07 (×2): 1
  Filled 2020-12-05: qty 1

## 2020-12-05 NOTE — Progress Notes (Signed)
PT Cancellation/Discharge Note  Patient Details Name: Brandon Daniel MRN: [id number] DOB: [birth year]   Cancelled Treatment:    Reason Eval/Treat Not Completed: Patient not medically ready  Noted pt with medical decline with plans to intubate. Will sign-off and please reorder PT when appropriate.   Jerolyn Center, PT Pager [phone]    Zena Amos 12/05/2020, 7:59 AM

## 2020-12-05 NOTE — Progress Notes (Signed)
ANTICOAGULATION CONSULT NOTE - Initial Consult  Pharmacy Consult for IV Heparin Indication: chest pain/ACS  No Known Allergies  Patient Measurements: Height: 5' 9.02" (175.3 cm) Weight: 100.8 kg (222 lb 3.6 oz) IBW/kg (Calculated) : 70.74 Heparin Dosing Weight: 91.6 kg  Vital Signs: Temp: 99.1 F (37.3 C) (06/11 0800) Temp Source: Axillary (06/11 0800) BP: 107/70 (06/11 0845) Pulse Rate: 78 (06/11 0845)  Labs: Recent Labs    12/03/20 0247 12/03/20 0331 12/04/20 0927 12/05/20 0044 12/05/20 0134 12/05/20 0250 12/05/20 0505 12/05/20 0756  HGB 11.9*  --  10.6* 15.3 13.7 14.6  --   --   HCT 38.3*  --  35.4* 45.0 45.3 43.0  --   --   PLT 189  --  182  --  303  --   --   --   CREATININE 0.74  --  0.74  --  1.36*  --   --   --   TROPONINIHS  --    < >  --   --  5,741*  --  >24,000* >24,000*   < > = values in this interval not displayed.    Estimated Creatinine Clearance: 61.7 mL/min (A) (by C-G formula based on SCr of 1.36 mg/dL (H)).   Medical History: Past Medical History:  Diagnosis Date   Asthma-COPD overlap syndrome (HCC)    CAD (coronary artery disease)    HLD (hyperlipidemia)    HTN (hypertension)    Lumbar disc herniation with radiculopathy    Lumbar stenosis    Osteoporosis     Medications:  Infusions:   sodium chloride     sodium chloride     azithromycin Stopped (12/05/20 0600)   [START ON 12/06/2020] cefTRIAXone (ROCEPHIN)  IV     fentaNYL infusion INTRAVENOUS 100 mcg/hr (12/05/20 0900)   heparin     phenylephrine (NEO-SYNEPHRINE) Adult infusion      Assessment: 70 years of age with influenza A and recent stroke s/p TPA on 6/9 at 23:10 PM (now >24hrs from dose given) who now has marked troponin elevations and extensive cardiac history. Pharmacy consulted to start IV Heparin. Patient on ASA and Plavix. CT and MRI with NO bleeding. CBC is improved.     Goal of Therapy:  Heparin level 0.3 to 0.5 units/ml due to recent stroke and s/p TPA Monitor  platelets by anticoagulation protocol: Yes   Plan:  Heparin (no bolus due to stroke and recent TPA) at 1100 units/hr.  Heparin level in 6 hours.   Link Snuffer, PharmD, BCPS, BCCCP Clinical Pharmacist Please refer to [facility] for [facility] Pharmacy numbers 12/05/2020,9:17 AM

## 2020-12-05 NOTE — Progress Notes (Addendum)
NAME:  Brandon Daniel, MRN:  [id number], DOB:  May 17, 1951, LOS: 2 ADMISSION DATE:  12/03/2020, CONSULTATION DATE:  12/04/20 REFERRING MD:  Derry Lory, CHIEF COMPLAINT:  weakness   History of Present Illness:  69 year old man with hx of CAD, HTN, HLD admitted for SOB from influenza A on 12/03/20.  Developed R face and R sided weakness this evening and received TPA after normal head CT.  After this he developed some worsening responsiveness so another CT ordered which looks fine. PCCM consulted to monitor airway.  Pertinent  Medical History  Asthma/COPD overlad CAD HLD HTN  Significant Hospital Events: Including procedures, antibiotic start and stop dates in addition to other pertinent events   6/9 admitted 6/9 evening code stroke, tpa, PCCM eval 6/10 Intubated for resp distress  Interim History / Subjective:   Intubated overnight after desaturation, respiratory distress and increased work of breathing  Objective   Blood pressure 107/70, pulse 78, temperature 99.1 F (37.3 C), temperature source Axillary, resp. rate (!) 26, height 5' 9.02" (1.753 m), weight 100.8 kg, SpO2 100 %.    Vent Mode: PRVC FiO2 (%):  [36 %-100 %] 70 % Set Rate:  [14 bmp-26 bmp] 26 bmp Vt Set:  [490 mL-570 mL] 490 mL PEEP:  [5 cmH20-10 cmH20] 10 cmH20 Plateau Pressure:  [24 cmH20-26 cmH20] 24 cmH20   Intake/Output Summary (Last 24 hours) at 12/05/2020 0858 Last data filed at 12/05/2020 0700 Gross per 24 hour  Intake 2973.07 ml  Output 1450 ml  Net 1523.07 ml   Filed Weights   12/03/20 0323 12/03/20 1400 12/04/20 0500  Weight: 102.1 kg 99.2 kg 100.8 kg    Examination: Gen:      No acute distress HEENT:  EOMI, sclera anicteric Neck:     No masses; no thyromegaly Lungs:    Bilateral rhonchi CV:         Regular rate and rhythm; no murmurs Abd:      + bowel sounds; soft, non-tender; no palpable masses, no distension Ext:    No edema; adequate peripheral perfusion Skin:      Warm and dry; no  rash Neuro: Unresponsive  Labs/imaging that I havepersonally reviewed  (right click and "Reselect all SmartList Selections" daily)   Creatinine 1.36, troponin elevated to greater than 24,000 CBC 21.8, hemoglobin 13.7, platelets 303 Chest x-ray with persistent bilateral infiltrates, edema  Resolved Hospital Problem list   N/a  Assessment & Plan:  Acute respiratory failure due to inability to clear secretions, acute pulmonary edema, Influenza A pna Intubated overnight Follow ABG, chest x-ray. Holding on Lasix as creatinine is higher Continue ceftriaxone, azithromycin On Tamiflu for influenza pneumonia  Left ACA, MCA stroke s/p tPA Management per neurology  Non ST elevation MI, history of cardiomyopathy, acute pulmonary edema Marked elevation in troponins noted.  He has extensive cardiac history We will start heparin drip, continue aspirin, Plavix Cardiology consulted Currently on Neo-Synephrine.  Will need central line.  AKI Supportive care, monitor urine output and creatinine  Left pleural effusion Too small to do thoracentesis.  Monitor  Nutrition Nutrition consult for tube feeds  Best practice (right click and "Reselect all SmartList Selections" daily)  Diet:  Tube Feed  Pain/Anxiety/Delirium protocol (if indicated): Yes (RASS goal -1) VAP protocol (if indicated): Yes DVT prophylaxis: Systemic AC GI prophylaxis: PPI Glucose control:  SSI No Central venous access:  N/A Arterial line:  N/A Foley:  N/A Mobility:  bed rest  PT consulted: N/A Last date of multidisciplinary goals  of care discussion [Pending] Code Status:  full code Disposition: ICU  Critical care time:    The patient is critically ill with multiple organ system failure and requires high complexity decision making for assessment and support, frequent evaluation and titration of therapies, advanced monitoring, review of radiographic studies and interpretation of complex data.   Critical Care Time  devoted to patient care services, exclusive of separately billable procedures, described in this note is 45 minutes.   Chilton Greathouse MD [place] [facility] See Amion for pager  If no response to pager , please call [phone] until 7pm After 7:00 pm call Elink  [phone] 12/05/2020, 9:14 AM

## 2020-12-05 NOTE — Plan of Care (Signed)
Have briefly spoken with on-call cardiology provider concerning patient's elevated troponin and new ST depression in V6 and worsening T wave inversions in inferior leads.  We agree that given patient's significant degree of hypoxia and acidosis that this is likely demand ischemia in the setting of already known coronary artery disease for which patient has had 18 stents previously placed.  We will put in to trend troponins and obtain echocardiogram in the a.m.  Vertis Kelch DO Internal Medicine/Pediatrics Pulmonary and Critical Care Fellow PGY-7

## 2020-12-05 NOTE — Procedures (Signed)
Central Venous Catheter Insertion Procedure Note  Brandon Daniel  [id number]  05-07-1951  Date:12/05/20  Time:1:39 PM   Provider Performing:Tammera Engert   Procedure: Insertion of Non-tunneled Central Venous [phone]) with US guidance ([id number])   Indication(s) Difficult access  Consent Risks of the procedure as well as the alternatives and risks of each were explained to the patient and/or caregiver.  Consent for the procedure was obtained and is signed in the bedside chart  Anesthesia Topical only with 1% lidocaine   Timeout Verified patient identification, verified procedure, site/side was marked, verified correct patient position, special equipment/implants available, medications/allergies/relevant history reviewed, required imaging and test results available.  Sterile Technique Maximal sterile technique including full sterile barrier drape, hand hygiene, sterile gown, sterile gloves, mask, hair covering, sterile ultrasound probe cover (if used).  Procedure Description Area of catheter insertion was cleaned with chlorhexidine and draped in sterile fashion.  Ultrasound was used to visualize the veins in real-time and needle entry in real-time With real-time ultrasound guidance a central venous catheter was placed into the left internal jugular vein. Nonpulsatile blood flow and easy flushing noted in all ports.  The catheter was sutured in place and sterile dressing applied.  Complications/Tolerance None; patient tolerated the procedure well. Chest X-ray is ordered to verify placement for internal jugular or subclavian cannulation.   Chest x-ray is not ordered for femoral cannulation. Cath tip is in the expected portion in SVC  EBL Minimal  Specimen(s) None     Chilton Greathouse MD [place] [facility] See Amion for pager  If no response to pager , please call [phone] until 7pm After 7:00 pm call Elink  [phone] 12/05/2020, 1:40 PM

## 2020-12-05 NOTE — Progress Notes (Signed)
OT Cancellation Note  Patient Details Name: Brandon Daniel MRN: [id number] DOB: 13-Sep-1950   Cancelled Treatment:    Reason Eval/Treat Not Completed: Medical issues which prohibited therapy.  Pt with respiratory distress and is to be intubated.  [name]., OTR/L Acute Rehabilitation Services Pager [phone] Office [phone]   [name] M 12/05/2020, 8:02 AM

## 2020-12-05 NOTE — Progress Notes (Signed)
ANTICOAGULATION CONSULT NOTE - Initial Consult  Pharmacy Consult for IV Heparin Indication: chest pain/ACS  No Known Allergies  Patient Measurements: Height: 5' 9.02" (175.3 cm) Weight: 100.8 kg (222 lb 3.6 oz) IBW/kg (Calculated) : 70.74 Heparin Dosing Weight: 91.6 kg  Vital Signs: Temp: 98.4 F (36.9 C) (06/11 1600) Temp Source: Axillary (06/11 1600) BP: 140/74 (06/11 1645) Pulse Rate: 87 (06/11 1645)  Labs: Recent Labs    12/03/20 0247 12/03/20 0331 12/04/20 0927 12/05/20 0044 12/05/20 0134 12/05/20 0250 12/05/20 0505 12/05/20 0756 12/05/20 0943 12/05/20 1516  HGB 11.9*  --  10.6* 15.3 13.7 14.6  --   --   --   --   HCT 38.3*  --  35.4* 45.0 45.3 43.0  --   --   --   --   PLT 189  --  182  --  303  --   --   --   --   --   HEPARINUNFRC  --   --   --   --   --   --   --   --   --  <0.10*  CREATININE 0.74  --  0.74  --  1.36*  --   --   --   --   --   TROPONINIHS  --    < >  --   --  5,741*  --  >24,000* >24,000* >24,000*  --    < > = values in this interval not displayed.     Estimated Creatinine Clearance: 61.7 mL/min (A) (by C-G formula based on SCr of 1.36 mg/dL (H)).   Medical History: Past Medical History:  Diagnosis Date   Asthma-COPD overlap syndrome (HCC)    CAD (coronary artery disease)    HLD (hyperlipidemia)    HTN (hypertension)    Lumbar disc herniation with radiculopathy    Lumbar stenosis    Osteoporosis     Medications:  Infusions:   sodium chloride     sodium chloride 250 mL/hr at 12/05/20 1500   azithromycin Stopped (12/05/20 0600)   [START ON 12/06/2020] cefTRIAXone (ROCEPHIN)  IV     fentaNYL infusion INTRAVENOUS 100 mcg/hr (12/05/20 1500)   heparin 1,100 Units/hr (12/05/20 1500)   norepinephrine (LEVOPHED) Adult infusion 40 mcg/min (12/05/20 1531)   sodium chloride      Assessment: 70 years of age with influenza A and recent stroke s/p TPA on 6/9 at 23:10 PM (now >24hrs from dose given) who now has marked troponin elevations  and extensive cardiac history. Pharmacy consulted to start IV Heparin. Patient on ASA and Plavix. CT and MRI with NO bleeding.   First HL undetectable.  No issues with infusion or bleeding per RN.    Goal of Therapy:  Heparin level 0.3 to 0.5 units/ml due to recent stroke and s/p TPA Monitor platelets by anticoagulation protocol: Yes   Plan:  Increase heparin to 1300 units/hr, no bolus Heparin level in 6 hours.  Monitor daily HL, CBC/plt Monitor for signs/symptoms of bleeding    Alphia Moh, PharmD, BCPS, BCCP Clinical Pharmacist  Please check AMION for all [facility] Pharmacy phone numbers After 10:00 PM, call Main Pharmacy [phone]

## 2020-12-05 NOTE — Progress Notes (Signed)
SLP Cancellation Note  Patient Details Name: Felicia Both MRN: [id number] DOB: [birth year]   Cancelled treatment:       Reason Eval/Treat Not Completed: Medical issues which prohibited therapy (Patient intubated. Will sign off. Please re-consult when ready.)  Ferdinand Lango MA, CCC-SLP  Branda Chaudhary Meryl 12/05/2020, 9:34 AM

## 2020-12-05 NOTE — Progress Notes (Signed)
2210 Pt found with sudden onset severe respiratory distress, desaturation to the low 80s, diaphoresis, increased WOB, and chest pain. Placed on non-rebreather. ECG completed.  2213 Elink called, awaiting call back from [facility] MD regarding possible intubation. 2236 Elink called x2 due to worsening patient condition.  2310 Ground team at bedside preparing for intubation.

## 2020-12-05 NOTE — Progress Notes (Signed)
eLink Physician-Brief Progress Note Patient Name: Brandon Daniel DOB: 1950/08/29 MRN: [id number]   Date of Service  12/05/2020  HPI/Events of Note  Patient with soft blood pressures, he is scheduled to go to MRI for imaging,  eICU Interventions  Phenylephrine gtt ordered for BP support.        Thomasene Lot Naturi Alarid 12/05/2020, 3:05 AM

## 2020-12-05 NOTE — Progress Notes (Signed)
STROKE TEAM PROGRESS NOTE    Interval History  Patient had increased WOB and hypoxia requiring emergent intubation last night.  He also had elevated troponins and new ST depression in V6 and worsening T wave inversions in inferior leads.  Trending troponins >24,000.  EF 55-60%  Patient hypotensive requiring Levophed infusion.  Pertinent Lab Work and Imaging    12/04/20 CT Head WO IV Contrast 1. No acute intracranial abnormality. 2. ASPECTS is 10.  12/03/20 CT Angio Head and Neck W WO IV Contrast 1. Negative CTA for emergent large vessel occlusion. 2. Extensive bulky calcified plaque about the left carotid bulb/proximal left ICA with associated severe near occlusive stenosis. Left ICA, MCA, and ACA markedly attenuated but remain patent distally. 3. Short-segment 70% atheromatous stenosis at the proximal cervical right ICA. 4. Severe left and moderate right vertebral artery origin stenoses.  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  12/04/20 Echocardiogram Complete  EF 55-60%,   12/04/20 VAS US Carotid  Right ICA 60-79% stenosis, left ICA 80-99% stenosis   Physical Examination   Constitutional: Calm, appropriate for condition  Cardiovascular: Normal RR Respiratory: No increased WOB   Mental status: Patient is lethargic but opens eyes partially to stimulation.  Unable to answer LOC questions due to aphasia and sedated while intubated. Follows commands ( squeezes hands and releases on command)  Speech: Globally aphasic.  Cranial nerves: EOMI, VFF, Right facial weakness Motor: Normal bulk and tone. No drift. LUE 3/5 LLE 3/5 RUE 2/5 with distal> proximal strength  RLE 0/5 Sensory: Appears intact to light tough  Coordination: Deferred  Gait: Deferred   Assessment and Plan   [name] is a 70 y.o. male w/pmh of CAD, HTN, lumbar stenosis, HLD who initially presented with SOB, was found to be Influenza A + and  later on day of admission developed mild R facial droop, R arm drift and hand grip weakness along with R leg weakness. He was stroke coded and received IVTPA.   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 CTA Head and Neck was pertinent for LICA with severe stenosis/near occlusion. Suspect L MCA stroke in the setting of atheroembolism however waiting on echo to evaluate for cardiac cause.  LDL 55 Hemoglobin A1C 6.2 vascular surgery consult for LICA stenosis when medically stable from a flu/respiratory standpoint  Diet: NPO, may need NG tube and tube feed recs DVT prophylaxis: SCDs, Heparin infusion Aspirin 81 mg and plavix 75mg  daily prior to admission, now on Heparin infusion Echo: EF 55-60% Therapy recommendations: pending Disposition: pending At discharge will place ambulatory referral to neurology for stroke follow up    NSTEMI  Cardiomyopathy history Elevated troponin 5,741 -> 24.00 Cardiology consulted overnight for New ST depression in V6 and worsening T wave inversion in inferior leads likely demand ischemia in the setting of known coronary artery disease (18 stents) and worsening hypoxia and acidosis Echo with bubble pending  Heparin gtt per cardiology left heart cath on Monday if stable  Hypertension Home medication: HCTZ 25 QD, Atenolol 100 mg QD, Lisinopril 20 mg QD at home.  SBP goal 90-140/50-85  Hyperlipidemia LDL goal is < 70. LDL is 55 and is at goal.  Atorvastatin 80 mg  Acute Respiratory Failure  Prior to admission he had SOB and cough. Noted to be hypoxic in the  ED. Was found to be Influenza A positive Intubated on 6/11 for increased WOB and hypoxia CXR bilateral infiltrates and pulmonary edema CCM on board  Acute Kidney Injury Creatinine 1.36 up from 0.74 NS infusion Follow creatinine and uop closely CCM on  board  Diabetes Hemoglobin A1C 6.2, goal <7 SSI CBGs  Dysphagia  Likely due to stroke SLP assessed him on 6/10, noted dysphagia and recommended NPO.  Place NGT and began enteral nutrition   Influenza A Screened for influenza given recent travel to [place] and ongoing SOB prior to admission. Noted to be febrile and hypoxic this admission. Tamiflu 75 BID for 5 days- 12/03/20 to 12/07/20.   Leukocytosis  Fever  Trend leukocytosis 11.8->21.8 CXR: bilateral infiltrates Ceftriaxone 2g q24hours Azithromycin 500mg  q24hrs   Hospital day # 2    To contact Stroke Continuity provider, please refer to . After hours, contact General Neurology

## 2020-12-05 NOTE — Procedures (Signed)
Intubation Procedure Note  Jaedon Siler  [id number]  Nov 15, 1950  Date:12/05/20  Time:12:24 AM   Provider Performing:Rudene Poulsen E Mikki Harbor    Procedure: Intubation (31500)  Indication(s) Respiratory Failure  Consent Unable to obtain consent due to emergent nature of procedure.   Anesthesia Etomidate, Fentanyl, and Rocuronium   Time Out Verified patient identification, verified procedure, site/side was marked, verified correct patient position, special equipment/implants available, medications/allergies/relevant history reviewed, required imaging and test results available.   Sterile Technique Usual hand hygeine, masks, and gloves were used   Procedure Description Patient positioned in bed supine.  Sedation given as noted above.  Patient was intubated with endotracheal tube using Glidescope.  View was Grade 1 full glottis .  Number of attempts was 1.  Colorimetric CO2 detector was consistent with tracheal placement.   Complications/Tolerance None; patient tolerated the procedure well. Chest X-ray is ordered to verify placement.   EBL Minimal   Specimen(s) None

## 2020-12-05 NOTE — Consult Note (Signed)
Referring Physician: Chilton Greathouse, MD  Brandon Daniel is an 70 y.o. male.                       Chief Complaint: NSTEMI  HPI: 70 years old white male with PMH of CAD, HTN, HLD, Lumbar disc disease, COPD and Type 2 DM had influenza A with shortness of breath on 12/03/2020. He had acute stroke treated with Tpa. His CT head was normal but he developed worsening responsiveness and got intubated 12/04/2020. Today he had EKG changes of sinus tachycardia v/s atrial flutter with diffuse ST depression and reciprocal ST elevation in aVL lead. Troponin I is elevated at over 24,000 ng/L. He is on IV heparin, atorvastatin, atenolol, furosemide and pantoprazole. He remains intubated, sedated and on pressure support. His echocardiogram shows preserved LV systolic function with  50-55 % LVEF.  Past Medical History:  Diagnosis Date   Asthma-COPD overlap syndrome (HCC)    CAD (coronary artery disease)    HLD (hyperlipidemia)    HTN (hypertension)    Lumbar disc herniation with radiculopathy    Lumbar stenosis    Osteoporosis       Past Surgical History:  Procedure Laterality Date   CATARACT EXTRACTION, BILATERAL  2021   CORONARY ARTERY BYPASS GRAFT  2005   INGUINAL HERNIA REPAIR  1998   LUMBAR DISC SURGERY  2019    Family History  Problem Relation Age of Onset   Cancer Mother    CAD Father    Diabetes Other    Colon cancer Other    Prostate cancer Other    Social History:  reports that he has quit smoking. His smoking use included cigarettes. He has a 60.00 pack-year smoking history. He has never used smokeless tobacco. He reports that he does not drink alcohol and does not use drugs.  Allergies: No Known Allergies  Medications Prior to Admission  Medication Sig Dispense Refill   albuterol (VENTOLIN HFA) 108 (90 Base) MCG/ACT inhaler Inhale 1 puff into the lungs every 6 (six) hours as needed for wheezing or shortness of breath.     alendronate (FOSAMAX) 70 MG tablet Take 70 mg by mouth once a  week. Take with a full glass of water on an empty stomach.     aspirin EC 81 MG tablet Take 81 mg by mouth in the morning and at bedtime. Swallow whole.     atenolol (TENORMIN) 100 MG tablet Take 100 mg by mouth daily.     atorvastatin (LIPITOR) 80 MG tablet Take 80 mg by mouth daily.     Cholecalciferol (VITAMIN D) 50 MCG (2000 UT) tablet Take 2,000 Units by mouth daily.     clopidogrel (PLAVIX) 75 MG tablet Take 75 mg by mouth daily.     ezetimibe (ZETIA) 10 MG tablet Take 10 mg by mouth daily.     gabapentin (NEURONTIN) 600 MG tablet Take 600 mg by mouth 2 (two) times daily.     hydrochlorothiazide (HYDRODIURIL) 25 MG tablet Take 25 mg by mouth daily.     lisinopril (ZESTRIL) 20 MG tablet Take 20 mg by mouth daily.     montelukast (SINGULAIR) 10 MG tablet Take 10 mg by mouth at bedtime.     nitroGLYCERIN (NITROSTAT) 0.4 MG SL tablet Place 0.4 mg under the tongue every 5 (five) minutes as needed for chest pain.     Omega-3 Fatty Acids (FISH OIL) 1000 MG CPDR Take 1,000 mg by mouth daily.  Polyvinyl Alcohol-Povidone (REFRESH OP) Place 1 drop into both eyes daily.     Tiotropium Bromide-Olodaterol (STIOLTO RESPIMAT) 2.5-2.5 MCG/ACT AERS Inhale 2 puffs into the lungs daily.      Results for orders placed or performed during the hospital encounter of 12/03/20 (from the past 48 hour(s))  Glucose, capillary     Status: Abnormal   Collection Time: 12/03/20 10:15 PM  Result Value Ref Range   Glucose-Capillary 193 (H) 70 - 99 mg/dL    Comment: Glucose reference range applies only to samples taken after fasting for at least 8 hours.  Glucose, capillary     Status: Abnormal   Collection Time: 12/03/20 11:40 PM  Result Value Ref Range   Glucose-Capillary 162 (H) 70 - 99 mg/dL    Comment: Glucose reference range applies only to samples taken after fasting for at least 8 hours.  MRSA PCR Screening     Status: None   Collection Time: 12/04/20 12:12 AM   Specimen: Nasopharyngeal  Result Value Ref  Range   MRSA by PCR NEGATIVE NEGATIVE    Comment:        The GeneXpert MRSA Assay (FDA approved for NASAL specimens only), is one component of a comprehensive MRSA colonization surveillance program. It is not intended to diagnose MRSA infection nor to guide or monitor treatment for MRSA infections. Performed at [facility], [address] [place], [place] [address]   Glucose, capillary     Status: Abnormal   Collection Time: 12/04/20 12:50 AM  Result Value Ref Range   Glucose-Capillary 163 (H) 70 - 99 mg/dL    Comment: Glucose reference range applies only to samples taken after fasting for at least 8 hours.  Blood gas, venous     Status: Abnormal   Collection Time: 12/04/20 12:56 AM  Result Value Ref Range   pH, Ven 7.360 7.250 - 7.430   pCO2, Ven 50.3 44.0 - 60.0 mmHg   pO2, Ven 67.7 (H) 32.0 - 45.0 mmHg   Bicarbonate 27.7 20.0 - 28.0 mmol/L   Acid-Base Excess 2.8 (H) 0.0 - 2.0 mmol/L   O2 Saturation 92.2 %   Patient temperature 37.0    Collection site VENOUS    Drawn by [id number]    Sample type VENOUS     Comment: Performed at [facility], [address] [place], [place] [address]  Hemoglobin A1c     Status: Abnormal   Collection Time: 12/04/20  2:19 AM  Result Value Ref Range   Hgb A1c MFr Bld 6.2 (H) 4.8 - 5.6 %    Comment: (NOTE)         Prediabetes: 5.7 - 6.4         Diabetes: >6.4         Glycemic control for adults with diabetes: <7.0    Mean Plasma Glucose 131 mg/dL    Comment: (NOTE) Performed At: [facility] [address] [place], [place] [id number] Jolene SchimkeNagendra Sanjai MD AO:[id number]:[phone]   Lipid panel     Status: Abnormal   Collection Time: 12/04/20  2:19 AM  Result Value Ref Range   Cholesterol 108 0 - 200 mg/dL   Triglycerides 79 <962<150 mg/dL   HDL 37 (L) >95>40 mg/dL   Total CHOL/HDL Ratio 2.9 RATIO   VLDL 16 0 - 40 mg/dL   LDL Cholesterol 55 0 - 99 mg/dL    Comment:        Total Cholesterol/HDL:CHD Risk Coronary Heart Disease Risk  Table  Men   Women  1/2 Average Risk   3.4   3.3  Average Risk       5.0   4.4  2 X Average Risk   9.6   7.1  3 X Average Risk  23.4   11.0        Use the calculated Patient Ratio above and the CHD Risk Table to determine the patient's CHD Risk.        ATP III CLASSIFICATION (LDL):  <100     mg/dL   Optimal  295-284  mg/dL   Near or Above                    Optimal  130-159  mg/dL   Borderline  132-440  mg/dL   High  >102     mg/dL   Very High Performed at [facility], [address] [place], [place] [address]   Basic metabolic panel     Status: Abnormal   Collection Time: 12/04/20  9:27 AM  Result Value Ref Range   Sodium 135 135 - 145 mmol/L   Potassium 4.0 3.5 - 5.1 mmol/L   Chloride 100 98 - 111 mmol/L   CO2 27 22 - 32 mmol/L   Glucose, Bld 117 (H) 70 - 99 mg/dL    Comment: Glucose reference range applies only to samples taken after fasting for at least 8 hours.   BUN 18 8 - 23 mg/dL   Creatinine, Ser 6.44 0.61 - 1.24 mg/dL   Calcium 8.3 (L) 8.9 - 10.3 mg/dL   GFR, Estimated >03 >47 mL/min    Comment: (NOTE) Calculated using the CKD-EPI Creatinine Equation (2021)    Anion gap 8 5 - 15    Comment: Performed at [facility], [address] [place], [place] [address]  CBC     Status: Abnormal   Collection Time: 12/04/20  9:27 AM  Result Value Ref Range   WBC 11.8 (H) 4.0 - 10.5 K/uL   RBC 3.73 (L) 4.22 - 5.81 MIL/uL   Hemoglobin 10.6 (L) 13.0 - 17.0 g/dL   HCT 63.8 (L) 75.6 - 43.3 %   MCV 94.9 80.0 - 100.0 fL   MCH 28.4 26.0 - 34.0 pg   MCHC 29.9 (L) 30.0 - 36.0 g/dL   RDW 29.5 18.8 - 41.6 %   Platelets 182 150 - 400 K/uL   nRBC 0.0 0.0 - 0.2 %    Comment: Performed at [facility], [address] [place], [place] [address]  Glucose, capillary     Status: Abnormal   Collection Time: 12/04/20  7:38 PM  Result Value Ref Range   Glucose-Capillary 113 (H) 70 - 99 mg/dL    Comment: Glucose reference range applies only to  samples taken after fasting for at least 8 hours.  Glucose, capillary     Status: Abnormal   Collection Time: 12/04/20 10:56 PM  Result Value Ref Range   Glucose-Capillary 143 (H) 70 - 99 mg/dL    Comment: Glucose reference range applies only to samples taken after fasting for at least 8 hours.  Glucose, capillary     Status: Abnormal   Collection Time: 12/05/20 12:19 AM  Result Value Ref Range   Glucose-Capillary 213 (H) 70 - 99 mg/dL    Comment: Glucose reference range applies only to samples taken after fasting for at least 8 hours.  I-STAT 7, (LYTES, BLD GAS, ICA, H+H)  Status: Abnormal   Collection Time: 12/05/20 12:44 AM  Result Value Ref Range   pH, Arterial 7.177 (LL) 7.350 - 7.450   pCO2 arterial 78.2 (HH) 32.0 - 48.0 mmHg   pO2, Arterial 83 83.0 - 108.0 mmHg   Bicarbonate 29.0 (H) 20.0 - 28.0 mmol/L   TCO2 31 22 - 32 mmol/L   O2 Saturation 92.0 %   Acid-base deficit 2.0 0.0 - 2.0 mmol/L   Sodium 138 135 - 145 mmol/L   Potassium 4.5 3.5 - 5.1 mmol/L   Calcium, Ion 1.17 1.15 - 1.40 mmol/L   HCT 45.0 39.0 - 52.0 %   Hemoglobin 15.3 13.0 - 17.0 g/dL   Patient temperature 14.9 F    Sample type ARTERIAL    Comment NOTIFIED PHYSICIAN   CBC with Differential/Platelet     Status: Abnormal   Collection Time: 12/05/20  1:34 AM  Result Value Ref Range   WBC 21.8 (H) 4.0 - 10.5 K/uL   RBC 4.78 4.22 - 5.81 MIL/uL   Hemoglobin 13.7 13.0 - 17.0 g/dL    Comment: REPEATED TO VERIFY DELTA CHECK NOTED    HCT 45.3 39.0 - 52.0 %   MCV 94.8 80.0 - 100.0 fL   MCH 28.7 26.0 - 34.0 pg   MCHC 30.2 30.0 - 36.0 g/dL   RDW 70.2 63.7 - 85.8 %   Platelets 303 150 - 400 K/uL   nRBC 0.0 0.0 - 0.2 %   Neutrophils Relative % 87 %   Neutro Abs 19.0 (H) 1.7 - 7.7 K/uL   Lymphocytes Relative 3 %   Lymphs Abs 0.6 (L) 0.7 - 4.0 K/uL   Monocytes Relative 9 %   Monocytes Absolute 2.0 (H) 0.1 - 1.0 K/uL   Eosinophils Relative 0 %   Eosinophils Absolute 0.0 0.0 - 0.5 K/uL   Basophils Relative 0  %   Basophils Absolute 0.0 0.0 - 0.1 K/uL   Immature Granulocytes 1 %   Abs Immature Granulocytes 0.25 (H) 0.00 - 0.07 K/uL    Comment: Performed at [facility], [address] [place], [place] [address]  Basic metabolic panel     Status: Abnormal   Collection Time: 12/05/20  1:34 AM  Result Value Ref Range   Sodium 139 135 - 145 mmol/L   Potassium 4.6 3.5 - 5.1 mmol/L   Chloride 99 98 - 111 mmol/L   CO2 25 22 - 32 mmol/L   Glucose, Bld 176 (H) 70 - 99 mg/dL    Comment: Glucose reference range applies only to samples taken after fasting for at least 8 hours.   BUN 21 8 - 23 mg/dL   Creatinine, Ser 7.41 (H) 0.61 - 1.24 mg/dL   Calcium 8.6 (L) 8.9 - 10.3 mg/dL   GFR, Estimated 57 (L) >60 mL/min    Comment: (NOTE) Calculated using the CKD-EPI Creatinine Equation (2021)    Anion gap 15 5 - 15    Comment: Performed at [facility], [address], [place], [place] [address]  Magnesium     Status: None   Collection Time: 12/05/20  1:34 AM  Result Value Ref Range   Magnesium 2.3 1.7 - 2.4 mg/dL    Comment: Performed at [facility], [address], [place], [place] [address]  Phosphorus     Status: Abnormal   Collection Time: 12/05/20  1:34 AM  Result Value Ref Range   Phosphorus 6.2 (H) 2.5 - 4.6 mg/dL    Comment:  Performed at [facility], [address], [place], [place] [address]  Troponin I (High Sensitivity)     Status: Abnormal   Collection Time: 12/05/20  1:34 AM  Result Value Ref Range   Troponin I (High Sensitivity) 5,741 (HH) <18 ng/L    Comment: CRITICAL RESULT CALLED TO, READ BACK BY AND VERIFIED WITH: Chestine Spore A,RN 12/05/20 0254 WAYK Performed at [facility], [address], [place], [place] [address]   Procalcitonin     Status: None   Collection Time: 12/05/20  1:34 AM  Result Value Ref Range   Procalcitonin 0.95 ng/mL    Comment:        Interpretation: PCT > 0.5 ng/mL and <= 2 ng/mL: Systemic infection (sepsis) is possible, but  other conditions are known to elevate PCT as well. (NOTE)       Sepsis PCT Algorithm           Lower Respiratory Tract                                      Infection PCT Algorithm    ----------------------------     ----------------------------         PCT < 0.25 ng/mL                PCT < 0.10 ng/mL          Strongly encourage             Strongly discourage   discontinuation of antibiotics    initiation of antibiotics    ----------------------------     -----------------------------       PCT 0.25 - 0.50 ng/mL            PCT 0.10 - 0.25 ng/mL               OR       >80% decrease in PCT            Discourage initiation of                                            antibiotics      Encourage discontinuation           of antibiotics    ----------------------------     -----------------------------         PCT >= 0.50 ng/mL              PCT 0.26 - 0.50 ng/mL                AND       <80% decrease in PCT             Encourage initiation of                                             antibiotics       Encourage continuation           of antibiotics    ----------------------------     -----------------------------        PCT >= 0.50 ng/mL  PCT > 0.50 ng/mL               AND         increase in PCT                  Strongly encourage                                      initiation of antibiotics    Strongly encourage escalation           of antibiotics                                     -----------------------------                                           PCT <= 0.25 ng/mL                                                 OR                                        > 80% decrease in PCT                                      Discontinue / Do not initiate                                             antibiotics  Performed at [facility], [address], [place], [place] [address]   I-STAT 7, (LYTES, BLD GAS, ICA, H+H)     Status: Abnormal   Collection Time:  12/05/20  2:50 AM  Result Value Ref Range   pH, Arterial 7.246 (L) 7.350 - 7.450   pCO2 arterial 58.0 (H) 32.0 - 48.0 mmHg   pO2, Arterial 93 83.0 - 108.0 mmHg   Bicarbonate 25.2 20.0 - 28.0 mmol/L   TCO2 27 22 - 32 mmol/L   O2 Saturation 95.0 %   Acid-base deficit 3.0 (H) 0.0 - 2.0 mmol/L   Sodium 138 135 - 145 mmol/L   Potassium 4.7 3.5 - 5.1 mmol/L   Calcium, Ion 1.16 1.15 - 1.40 mmol/L   HCT 43.0 39.0 - 52.0 %   Hemoglobin 14.6 13.0 - 17.0 g/dL   Patient temperature 60.4 F    Sample type ARTERIAL   Glucose, capillary     Status: Abnormal   Collection Time: 12/05/20  4:38 AM  Result Value Ref Range   Glucose-Capillary 190 (H) 70 - 99 mg/dL    Comment: Glucose reference range applies only to samples taken after fasting for at least 8 hours.  Troponin I (High Sensitivity)     Status: Abnormal   Collection Time: 12/05/20  5:05 AM  Result Value Ref  Range   Troponin I (High Sensitivity) >24,000 (HH) <18 ng/L    Comment: CRITICAL VALUE NOTED.  VALUE IS CONSISTENT WITH PREVIOUSLY REPORTED AND CALLED VALUE. Performed at [facility], [address], [place], [place] [address]   Glucose, capillary     Status: Abnormal   Collection Time: 12/05/20  7:29 AM  Result Value Ref Range   Glucose-Capillary 175 (H) 70 - 99 mg/dL    Comment: Glucose reference range applies only to samples taken after fasting for at least 8 hours.  Troponin I (High Sensitivity)     Status: Abnormal   Collection Time: 12/05/20  7:56 AM  Result Value Ref Range   Troponin I (High Sensitivity) >24,000 (HH) <18 ng/L    Comment: CRITICAL VALUE NOTED.  VALUE IS CONSISTENT WITH PREVIOUSLY REPORTED AND CALLED VALUE. Performed at [facility], [address] [place], [place] [address]   Troponin I (High Sensitivity)     Status: Abnormal   Collection Time: 12/05/20  9:43 AM  Result Value Ref Range   Troponin I (High Sensitivity) >24,000 (HH) <18 ng/L    Comment: CRITICAL VALUE NOTED.  VALUE IS CONSISTENT  WITH PREVIOUSLY REPORTED AND CALLED VALUE. Performed at [facility], [address] [place], [place] [address]   Glucose, capillary     Status: Abnormal   Collection Time: 12/05/20 12:02 PM  Result Value Ref Range   Glucose-Capillary 192 (H) 70 - 99 mg/dL    Comment: Glucose reference range applies only to samples taken after fasting for at least 8 hours.   CT HEAD WO CONTRAST  Result Date: 12/04/2020 CLINICAL DATA:  Initial evaluation for cerebral hemorrhage. EXAM: CT HEAD WITHOUT CONTRAST TECHNIQUE: Contiguous axial images were obtained from the base of the skull through the vertex without intravenous contrast. COMPARISON:  Prior studies from 12/03/2020. FINDINGS: Brain: Stable cerebral volume. No acute intracranial hemorrhage. No visible acute or evolving large vessel territory infarct. No mass lesion or mass effect. No hydrocephalus or extra-axial fluid collection. Vascular: Small amount of residual contrast material present within the intracranial circulation. No visible asymmetric hyperdense vessel. Scattered vascular calcifications noted within the carotid siphons. Skull: Scalp soft tissues and calvarium are unchanged. Sinuses/Orbits: Globes and orbital soft tissues demonstrate no new finding. Scattered paranasal sinus disease again noted. Other: None. IMPRESSION: Stable head CT. No acute intracranial abnormality. Electronically Signed   By: Rise Mu M.D.   On: 12/04/2020 00:44   CT HEAD WO CONTRAST  Result Date: 12/03/2020 CLINICAL DATA:  Initial evaluation for acute neuro deficit, stroke suspected. EXAM: CT HEAD WITHOUT CONTRAST TECHNIQUE: Contiguous axial images were obtained from the base of the skull through the vertex without intravenous contrast. COMPARISON:  Prior CT from 10/20/2020. FINDINGS: Brain: Cerebral volume within normal limits. No acute intracranial hemorrhage. No acute large vessel territory infarct. No mass lesion, midline shift or mass effect. No  hydrocephalus or extra-axial fluid collection. Vascular: No hyperdense vessel. Calcified atherosclerosis at the skull base. Skull: Scalp soft tissues and calvarium within normal limits. Sinuses/Orbits: Globes orbital soft tissues demonstrate no acute finding. Scattered chronic mucoperiosteal thickening present within the paranasal sinuses. Trace right mastoid effusion noted, of doubtful significance. Other: None. ASPECTS [facility] Stroke Program Early CT Score) - Ganglionic level infarction (caudate, lentiform nuclei, internal capsule, insula, M1-M3 cortex): 7 - Supraganglionic infarction (M4-M6 cortex): 3 Total score (0-10 with 10 being normal): 10 IMPRESSION: 1. No acute intracranial abnormality. 2. ASPECTS is 10. Critical Value/emergent results were called  by telephone at the time of interpretation on 12/03/2020 at 11:07 pm to provider Dr. Derry Lory, Who verbally acknowledged these results. Electronically Signed   By: Rise Mu M.D.   On: 12/03/2020 23:12   MR BRAIN WO CONTRAST  Result Date: 12/05/2020 CLINICAL DATA:  Shortness of breath. Influenza. Right face and right-sided weakness requiring tPA EXAM: MRI HEAD WITHOUT CONTRAST TECHNIQUE: Multiplanar, multiecho pulse sequences of the brain and surrounding structures were obtained without intravenous contrast. COMPARISON:  Head CT and CTA from 2 days ago FINDINGS: Brain: Restricted diffusion in the left basal ganglia and of various intensity along the frontal and antro medial temporal lobes with minimal extension into the parietal lobe. Both the left ACA and MCA territory is affected. No hemorrhage, hydrocephalus, collection, or masslike finding. Accentuated vessels on gradient imaging at the level of infarction from intravascular deoxygenation. Vascular: Absent flow void in the left ICA with underfilling. Skull and upper cervical spine: No focal marrow lesion Sinuses/Orbits: Patchy mucosal thickening in paranasal sinuses. Bilateral mastoid  opacification. There is nasopharyngeal fluid in the setting of intubation. 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 Electronically Signed   By: Marnee Spring M.D.   On: 12/05/2020 04:36   DG Chest Port 1 View  Result Date: 12/05/2020 CLINICAL DATA:  Intubated.  Respiratory distress. EXAM: PORTABLE CHEST 1 VIEW COMPARISON:  One day prior FINDINGS: Endotracheal tube terminates 5.4 cm above carina. Nasogastric tube likely extends beyond the inferior aspect of the film. Prior median sternotomy. Midline trachea. Mild cardiomegaly. Small left pleural effusion is similar. No pneumothorax. Interstitial and basilar predominant airspace disease again identified. Overall improved aeration, with decreased airspace opacities in the upper lobes. IMPRESSION: Slightly improved aeration. Persistent interstitial and airspace opacities, at least partially felt to represent congestive heart failure. Concurrent pneumonia cannot be excluded. Similar small left pleural effusion. Electronically Signed   By: Jeronimo Greaves M.D.   On: 12/05/2020 07:58   Portable Chest x-ray  Result Date: 12/05/2020 CLINICAL DATA:  Tube placement EXAM: PORTABLE CHEST 1 VIEW COMPARISON:  12/03/2020 FINDINGS: Endotracheal tube is 4 cm above the carina. NG tube is the stomach. Prior CABG. Heart is borderline in size. Worsening diffuse bilateral airspace disease, right greater than left. Small left effusion. No pneumothorax. IMPRESSION: Worsening bilateral airspace disease could reflect edema or infection. Small left effusion Electronically Signed   By: Charlett Nose M.D.   On: 12/05/2020 00:07   ECHOCARDIOGRAM COMPLETE  Result Date: 12/04/2020    ECHOCARDIOGRAM REPORT   Patient Name:   MIKKO LEWELLEN Date of Exam: 12/04/2020 Medical Rec #:  [id number]     Height:       69.0 in Accession #:    [id number]    Weight:       222.2 lb Date of Birth:  Sep 16, 1950    BSA:           2.161 m Patient Age:    69 years      BP:           141/59 mmHg Patient Gender: M             HR:           92 bpm. Exam Location:  Inpatient Procedure: 2D Echo, Cardiac Doppler and Color Doppler Indications:    NSTEMI  History:        Patient has no prior history of Echocardiogram examinations.  Sonographer:  Gertie Fey MHA, RDMS, RVT, RDCS Referring Phys: [id number] Carlton Adam  Sonographer Comments: Image acquisition challenging due to patient body habitus, Image acquisition challenging due to respiratory motion and continuous coughing (+ Influenza A). IMPRESSIONS  1. Left ventricular ejection fraction, by estimation, is 55 to 60%. The left ventricle has normal function. The left ventricle has no regional wall motion abnormalities. Left ventricular diastolic parameters are indeterminate. Elevated left ventricular end-diastolic pressure.  2. Right ventricular systolic function is normal. The right ventricular size is normal. There is normal pulmonary artery systolic pressure. The estimated right ventricular systolic pressure is 11.0 mmHg.  3. The mitral valve is normal in structure. No evidence of mitral valve regurgitation. No evidence of mitral stenosis.  4. The aortic valve was not well visualized. Aortic valve regurgitation is not visualized. No aortic stenosis is present.  5. The inferior vena cava is normal in size with greater than 50% respiratory variability, suggesting right atrial pressure of 3 mmHg. FINDINGS  Left Ventricle: Left ventricular ejection fraction, by estimation, is 55 to 60%. The left ventricle has normal function. The left ventricle has no regional wall motion abnormalities. The left ventricular internal cavity size was normal in size. There is  no left ventricular hypertrophy. Left ventricular diastolic parameters are indeterminate. Elevated left ventricular end-diastolic pressure. Right Ventricle: The right ventricular size is normal. No increase in right ventricular  wall thickness. Right ventricular systolic function is normal. There is normal pulmonary artery systolic pressure. The tricuspid regurgitant velocity is 1.41 m/s, and  with an assumed right atrial pressure of 3 mmHg, the estimated right ventricular systolic pressure is 11.0 mmHg. Left Atrium: Left atrial size was normal in size. Right Atrium: Right atrial size was normal in size. Pericardium: There is no evidence of pericardial effusion. Mitral Valve: The mitral valve is normal in structure. No evidence of mitral valve regurgitation. No evidence of mitral valve stenosis. Tricuspid Valve: The tricuspid valve is normal in structure. Tricuspid valve regurgitation is not demonstrated. No evidence of tricuspid stenosis. Aortic Valve: The aortic valve was not well visualized. Aortic valve regurgitation is not visualized. No aortic stenosis is present. Aortic valve mean gradient measures 5.0 mmHg. Aortic valve peak gradient measures 12.2 mmHg. Aortic valve area, by VTI measures 2.62 cm. Pulmonic Valve: The pulmonic valve was not well visualized. Pulmonic valve regurgitation is trivial. No evidence of pulmonic stenosis. Aorta: Aortic root could not be assessed. Venous: The inferior vena cava is normal in size with greater than 50% respiratory variability, suggesting right atrial pressure of 3 mmHg. IAS/Shunts: No atrial level shunt detected by color flow Doppler.  LEFT VENTRICLE PLAX 2D LVIDd:         5.05 cm      Diastology LVIDs:         3.55 cm      LV e' medial:    6.53 cm/s LV PW:         0.90 cm      LV E/e' medial:  18.2 LV IVS:        0.95 cm      LV e' lateral:   10.80 cm/s LVOT diam:     2.20 cm      LV E/e' lateral: 11.0 LV SV:         68 LV SV Index:   31 LVOT Area:     3.80 cm  LV Volumes (MOD) LV vol d, MOD A2C: 145.0 ml LV vol d, MOD A4C: 89.6 ml LV  vol s, MOD A2C: 68.4 ml LV vol s, MOD A4C: 38.8 ml LV SV MOD A2C:     76.6 ml LV SV MOD A4C:     89.6 ml LV SV MOD BP:      65.4 ml LEFT ATRIUM              Index       RIGHT ATRIUM           Index LA diam:        3.70 cm 1.71 cm/m  RA Area:     17.80 cm LA Vol (A2C):   53.6 ml 24.81 ml/m RA Volume:   45.70 ml  21.15 ml/m LA Vol (A4C):   35.2 ml 16.29 ml/m LA Biplane Vol: 45.4 ml 21.01 ml/m  AORTIC VALVE AV Area (Vmax):    2.45 cm AV Area (Vmean):   2.38 cm AV Area (VTI):     2.62 cm AV Vmax:           175.00 cm/s AV Vmean:          105.000 cm/s AV VTI:            0.260 m AV Peak Grad:      12.2 mmHg AV Mean Grad:      5.0 mmHg LVOT Vmax:         113.00 cm/s LVOT Vmean:        65.700 cm/s LVOT VTI:          0.179 m LVOT/AV VTI ratio: 0.69  AORTA Ao Root diam: 3.10 cm MITRAL VALVE                TRICUSPID VALVE MV Area (PHT): 3.91 cm     TR Peak grad:   8.0 mmHg MV Decel Time: 194 msec     TR Vmax:        141.00 cm/s MV E velocity: 119.00 cm/s MV A velocity: 103.00 cm/s  SHUNTS MV E/A ratio:  1.16         Systemic VTI:  0.18 m                             Systemic Diam: 2.20 cm Armanda Magic MD Electronically signed by Armanda Magic MD Signature Date/Time: 12/04/2020/5:24:21 PM    Final    VAS US CAROTID  Result Date: 12/04/2020 Carotid Arterial Duplex Study Patient Name:  OMERO KOWAL  Date of Exam:   12/04/2020 Medical Rec #: [id number]      Accession #:    [id number] Date of Birth: 1950/09/13     Patient Gender: M Patient Age:   69Y Exam Location:  [facility] Procedure:      VAS US CAROTID Referring Phys: [id number] [facility] --------------------------------------------------------------------------------  Indications:       CVA. Risk Factors:      Hypertension, hyperlipidemia. Limitations        Today's exam was limited due to heavy calcification and the                    resulting shadowing, the patient's respiratory variation and                    patient movement, constant coughing. Comparison Study:  12/03/2020 - CTA Neck                    IMPRESSION:  1. Negative CTA for emergent large vessel occlusion.                     2. Extensive bulky calcified plaque about the left carotid                    bulb/proximal left ICA with associated severe near occlusive                    stenosis. Left ICA, MCA, and ACA markedly attenuated but                    remain                    patent distally.                    3. Short-segment 70% atheromatous stenosis at the proximal                    cervical                    right ICA.                    4. Severe left and moderate right vertebral artery origin                    stenoses.                    5. Scattered nodular and ground-glass opacity within the                    upper lobes bilaterally, suspicious for infectious                    pneumonitis. Correlation with plain film radiography                    suggested. Performing Technologist: Chanda Busing RVT  Examination Guidelines: A complete evaluation includes B-mode imaging, spectral Doppler, color Doppler, and power Doppler as needed of all accessible portions of each vessel. Bilateral testing is considered an integral part of a complete examination. Limited examinations for reoccurring indications may be performed as noted.  Right Carotid Findings: +----------+--------+--------+--------+-----------------------+--------+           PSV cm/sEDV cm/sStenosisPlaque Description     Comments +----------+--------+--------+--------+-----------------------+--------+ CCA Prox  85      15              smooth and heterogenous         +----------+--------+--------+--------+-----------------------+--------+ CCA Distal                        calcific                        +----------+--------+--------+--------+-----------------------+--------+ ICA Prox  267     75      60-79%  calcific and irregular          +----------+--------+--------+--------+-----------------------+--------+ ICA Mid   250     59              smooth and heterogenous          +----------+--------+--------+--------+-----------------------+--------+ ICA Distal130     56  tortuous +----------+--------+--------+--------+-----------------------+--------+ ECA       466     123                                             +----------+--------+--------+--------+-----------------------+--------+ +----------+--------+-------+--------+-------------------+           PSV cm/sEDV cmsDescribeArm Pressure (mmHG) +----------+--------+-------+--------+-------------------+ Subclavian310                                        +----------+--------+-------+--------+-------------------+ +---------+--------+---+--------+--+---------+ VertebralPSV cm/s106EDV cm/s27Antegrade +---------+--------+---+--------+--+---------+  Left Carotid Findings: +----------+--------+--------+--------+---------------------+------------------+           PSV cm/sEDV cm/sStenosisPlaque Description   Comments           +----------+--------+--------+--------+---------------------+------------------+ CCA Prox  48      4               smooth and           High resistant                                       heterogenous         flow noted in the                                                         proximal CCA.      +----------+--------+--------+--------+---------------------+------------------+ CCA Distal20      5               calcific                                +----------+--------+--------+--------+---------------------+------------------+ ICA Prox  608     165     80-99%  calcific                                +----------+--------+--------+--------+---------------------+------------------+ ICA Mid   165     22              smooth and                                                                heterogenous                             +----------+--------+--------+--------+---------------------+------------------+ ICA Distal77      19                                   tortuous           +----------+--------+--------+--------+---------------------+------------------+ ECA       188     31                                                      +----------+--------+--------+--------+---------------------+------------------+ +----------+--------+--------+--------+-------------------+  PSV cm/sEDV cm/sDescribeArm Pressure (mmHG) +----------+--------+--------+--------+-------------------+ [id number]                                         +----------+--------+--------+--------+-------------------+ +---------+--------+--+--------+--+---------+ VertebralPSV cm/s78EDV cm/s26Antegrade +---------+--------+--+--------+--+---------+   Summary: Right Carotid: Velocities in the right ICA are consistent with a 60-79%                stenosis. Left Carotid: Velocities in the left ICA are consistent with a 80-99% stenosis. Vertebrals: Bilateral vertebral arteries demonstrate antegrade flow. *See table(s) above for measurements and observations.  Electronically signed by Heath Lark on 12/04/2020 at 9:20:12 PM.    Final    CT ANGIO HEAD CODE STROKE  Result Date: 12/04/2020 CLINICAL DATA:  Initial evaluation for acute stroke. EXAM: CT ANGIOGRAPHY HEAD AND NECK TECHNIQUE: Multidetector CT imaging of the head and neck was performed using the standard protocol during bolus administration of intravenous contrast. Multiplanar CT image reconstructions and MIPs were obtained to evaluate the vascular anatomy. Carotid stenosis measurements (when applicable) are obtained utilizing NASCET criteria, using the distal internal carotid diameter as the denominator. CONTRAST:  75mL OMNIPAQUE IOHEXOL 350 MG/ML SOLN COMPARISON:  Prior head CT from earlier the same day. FINDINGS: CTA NECK FINDINGS Aortic arch: Examination technically  limited by timing the contrast bolus and motion artifact. Visualized aortic arch normal in caliber with normal 3 vessel morphology. Moderate atheromatous change about the arch and origin of the great vessels without hemodynamically significant stenosis. Right carotid system: Right CCA patent from its origin to the bifurcation without stenosis. Moderate atheromatous plaque about the right carotid bulb and proximal right ICA. Focal tortuosity with associated short-segment stenosis of up to approximately 70% by NASCET criteria seen at the proximal a right ICA (series 7, image 193). Right ICA patent distally without stenosis, dissection or occlusion. Left carotid system: Scattered atheromatous change within the mid left CCA without significant stenosis. Extensive bulky calcified plaque about the left carotid bulb/proximal left ICA with associated severe near occlusive stenosis (series 7, image 225). Area of involvement begins at the carotid bulb and measures approximately 1.5 cm in length. Left ICA markedly attenuated and diminutive but patent distally to the skull base without additional stenosis. No visible dissection or occlusion. Vertebral arteries: Both vertebral arteries arise from the subclavian arteries. No hemodynamically significant proximal subclavian artery stenosis. Atheromatous change at the origins of both vertebral arteries with associated severe stenosis on the left and moderate stenosis on the right. Vertebral arteries otherwise patent within the neck without stenosis, dissection or occlusion. Skeleton: No visible acute osseous finding. No discrete or worrisome osseous lesions. Median sternotomy wires noted. Other neck: No other acute soft tissue abnormality within the neck. Diffuse thyroidal enlargement with punctate dystrophic calcification within the left thyroid lobe. No other mass or adenopathy. Upper chest: Scattered nodular and ground-glass opacity seen within the upper lobes bilaterally,  suspicious for infectious pneumonitis. Visualized upper chest otherwise unremarkable. Review of the MIP images confirms the above findings CTA HEAD FINDINGS Anterior circulation: Right ICA patent from the skull base to the terminus without stenosis. Left ICA attenuated but patent to the terminus as well without visible stenosis. A1 segments patent. Normal anterior communicating artery complex. Anterior cerebral arteries patent to their distal aspects without stenosis, with the left ACA attenuated as compared to the right. Right M1 segment widely patent. Normal right MCA bifurcation. Distal right MCA branches well perfused and  symmetric. Attenuated but patent flow within the left M1 segment without stenosis. Normal left MCA bifurcation. No proximal left MCA branch occlusion. Distal left MCA branches attenuated as compared to the right related to the severe proximal left ICA stenosis. Posterior circulation: Both V4 segments patent to the vertebrobasilar junction without stenosis. Both PICA origins patent and normal. Basilar patent to its distal aspect without stenosis. Superior cerebral arteries patent bilaterally. Both PCAs primarily supplied via the basilar. Both PCAs remain patent and well perfused to their distal aspects. Venous sinuses: Grossly patent allowing for timing the contrast bolus. Anatomic variants: None significant.  No visible aneurysm. Review of the MIP images confirms the above findings IMPRESSION: 1. Negative CTA for emergent large vessel occlusion. 2. Extensive bulky calcified plaque about the left carotid bulb/proximal left ICA with associated severe near occlusive stenosis. Left ICA, MCA, and ACA markedly attenuated but remain patent distally. 3. Short-segment 70% atheromatous stenosis at the proximal cervical right ICA. 4. Severe left and moderate right vertebral artery origin stenoses. 5. Scattered nodular and ground-glass opacity within the upper lobes bilaterally, suspicious for infectious  pneumonitis. Correlation with plain film radiography suggested. Electronically Signed   By: Rise Mu M.D.   On: 12/04/2020 00:14   CT ANGIO NECK CODE STROKE  Result Date: 12/04/2020 CLINICAL DATA:  Initial evaluation for acute stroke. EXAM: CT ANGIOGRAPHY HEAD AND NECK TECHNIQUE: Multidetector CT imaging of the head and neck was performed using the standard protocol during bolus administration of intravenous contrast. Multiplanar CT image reconstructions and MIPs were obtained to evaluate the vascular anatomy. Carotid stenosis measurements (when applicable) are obtained utilizing NASCET criteria, using the distal internal carotid diameter as the denominator. CONTRAST:  75mL OMNIPAQUE IOHEXOL 350 MG/ML SOLN COMPARISON:  Prior head CT from earlier the same day. FINDINGS: CTA NECK FINDINGS Aortic arch: Examination technically limited by timing the contrast bolus and motion artifact. Visualized aortic arch normal in caliber with normal 3 vessel morphology. Moderate atheromatous change about the arch and origin of the great vessels without hemodynamically significant stenosis. Right carotid system: Right CCA patent from its origin to the bifurcation without stenosis. Moderate atheromatous plaque about the right carotid bulb and proximal right ICA. Focal tortuosity with associated short-segment stenosis of up to approximately 70% by NASCET criteria seen at the proximal a right ICA (series 7, image 193). Right ICA patent distally without stenosis, dissection or occlusion. Left carotid system: Scattered atheromatous change within the mid left CCA without significant stenosis. Extensive bulky calcified plaque about the left carotid bulb/proximal left ICA with associated severe near occlusive stenosis (series 7, image 225). Area of involvement begins at the carotid bulb and measures approximately 1.5 cm in length. Left ICA markedly attenuated and diminutive but patent distally to the skull base without  additional stenosis. No visible dissection or occlusion. Vertebral arteries: Both vertebral arteries arise from the subclavian arteries. No hemodynamically significant proximal subclavian artery stenosis. Atheromatous change at the origins of both vertebral arteries with associated severe stenosis on the left and moderate stenosis on the right. Vertebral arteries otherwise patent within the neck without stenosis, dissection or occlusion. Skeleton: No visible acute osseous finding. No discrete or worrisome osseous lesions. Median sternotomy wires noted. Other neck: No other acute soft tissue abnormality within the neck. Diffuse thyroidal enlargement with punctate dystrophic calcification within the left thyroid lobe. No other mass or adenopathy. Upper chest: Scattered nodular and ground-glass opacity seen within the upper lobes bilaterally, suspicious for infectious pneumonitis. Visualized upper chest otherwise unremarkable.  Review of the MIP images confirms the above findings CTA HEAD FINDINGS Anterior circulation: Right ICA patent from the skull base to the terminus without stenosis. Left ICA attenuated but patent to the terminus as well without visible stenosis. A1 segments patent. Normal anterior communicating artery complex. Anterior cerebral arteries patent to their distal aspects without stenosis, with the left ACA attenuated as compared to the right. Right M1 segment widely patent. Normal right MCA bifurcation. Distal right MCA branches well perfused and symmetric. Attenuated but patent flow within the left M1 segment without stenosis. Normal left MCA bifurcation. No proximal left MCA branch occlusion. Distal left MCA branches attenuated as compared to the right related to the severe proximal left ICA stenosis. Posterior circulation: Both V4 segments patent to the vertebrobasilar junction without stenosis. Both PICA origins patent and normal. Basilar patent to its distal aspect without stenosis. Superior  cerebral arteries patent bilaterally. Both PCAs primarily supplied via the basilar. Both PCAs remain patent and well perfused to their distal aspects. Venous sinuses: Grossly patent allowing for timing the contrast bolus. Anatomic variants: None significant.  No visible aneurysm. Review of the MIP images confirms the above findings IMPRESSION: 1. Negative CTA for emergent large vessel occlusion. 2. Extensive bulky calcified plaque about the left carotid bulb/proximal left ICA with associated severe near occlusive stenosis. Left ICA, MCA, and ACA markedly attenuated but remain patent distally. 3. Short-segment 70% atheromatous stenosis at the proximal cervical right ICA. 4. Severe left and moderate right vertebral artery origin stenoses. 5. Scattered nodular and ground-glass opacity within the upper lobes bilaterally, suspicious for infectious pneumonitis. Correlation with plain film radiography suggested. Electronically Signed   By: Rise Mu M.D.   On: 12/04/2020 00:14    Review Of Systems As per HPI.   Blood pressure 108/64, pulse 85, temperature 98.7 F (37.1 C), temperature source Axillary, resp. rate (!) 26, height 5' 9.02" (1.753 m), weight 100.8 kg, SpO2 98 %. Body mass index is 32.8 kg/m. General appearance: Intubated and sedated. appears stated age. Head: Normocephalic, atraumatic. Eyes: Blue eyes, pink conjunctiva, corneas clear.  Neck: No adenopathy, no carotid bruit, no JVD, supple, symmetrical, trachea midline and thyroid not enlarged. Resp: Clearing to auscultation bilaterally. Cardio: Regular rate and rhythm, S1, S2 normal, II/VI systolic murmur, no click, rub or gallop GI: Soft, bowel sounds normal; no organomegaly. Extremities: No edema, cyanosis or clubbing. Skin: Warm and dry.  Neurologic: Alert and oriented X 0.  Assessment/Plan NSTEMI, acute Acute respiratory failure with hypoxia Left ACA, MCA stroke AKI Left pleural effusion Possible septic  shock  Plan: Try fluid bolus to improve blood pressure Continue inotropic support as needed. May use norepinephrine drip. May keep BP systolic 90 to 140 mm and diastolic over 50-85 mm Agree with heparin, Plavix, Betablocker, Atorvastatin use. May change atenolol to metoprolol 5 mg. Q 6 hour IV for HR control. Schedule left heart cath on Monday if stable.   Time spent: Review of old records, Lab, x-rays, EKG, other cardiac tests, examination, discussion with patient over 70 minutes.  Ricki Rodriguez, MD  12/05/2020, 12:55 PM

## 2020-12-06 ENCOUNTER — Other Ambulatory Visit (HOSPITAL_COMMUNITY): Payer: Medicare (Managed Care)

## 2020-12-06 DIAGNOSIS — I63512 Cerebral infarction due to unspecified occlusion or stenosis of left middle cerebral artery: Secondary | ICD-10-CM

## 2020-12-06 DIAGNOSIS — I6522 Occlusion and stenosis of left carotid artery: Secondary | ICD-10-CM | POA: Diagnosis not present

## 2020-12-06 LAB — CBC WITH DIFFERENTIAL/PLATELET
Abs Immature Granulocytes: 0.03 10*3/uL (ref 0.00–0.07)
Basophils Absolute: 0 10*3/uL (ref 0.0–0.1)
Basophils Relative: 0 %
Eosinophils Absolute: 0 10*3/uL (ref 0.0–0.5)
Eosinophils Relative: 0 %
HCT: 31.4 % — ABNORMAL LOW (ref 39.0–52.0)
Hemoglobin: 9.4 g/dL — ABNORMAL LOW (ref 13.0–17.0)
Immature Granulocytes: 0 %
Lymphocytes Relative: 7 %
Lymphs Abs: 0.5 10*3/uL — ABNORMAL LOW (ref 0.7–4.0)
MCH: 28.2 pg (ref 26.0–34.0)
MCHC: 29.9 g/dL — ABNORMAL LOW (ref 30.0–36.0)
MCV: 94.3 fL (ref 80.0–100.0)
Monocytes Absolute: 0.6 10*3/uL (ref 0.1–1.0)
Monocytes Relative: 10 %
Neutro Abs: 5.6 10*3/uL (ref 1.7–7.7)
Neutrophils Relative %: 83 %
Platelets: 159 10*3/uL (ref 150–400)
RBC: 3.33 MIL/uL — ABNORMAL LOW (ref 4.22–5.81)
RDW: 14.2 % (ref 11.5–15.5)
WBC: 6.7 10*3/uL (ref 4.0–10.5)
nRBC: 0 % (ref 0.0–0.2)

## 2020-12-06 LAB — CBC
HCT: 32 % — ABNORMAL LOW (ref 39.0–52.0)
Hemoglobin: 9.7 g/dL — ABNORMAL LOW (ref 13.0–17.0)
MCH: 28.4 pg (ref 26.0–34.0)
MCHC: 30.3 g/dL (ref 30.0–36.0)
MCV: 93.8 fL (ref 80.0–100.0)
Platelets: 170 10*3/uL (ref 150–400)
RBC: 3.41 MIL/uL — ABNORMAL LOW (ref 4.22–5.81)
RDW: 14.1 % (ref 11.5–15.5)
WBC: 6.1 10*3/uL (ref 4.0–10.5)
nRBC: 0 % (ref 0.0–0.2)

## 2020-12-06 LAB — TROPONIN I (HIGH SENSITIVITY): Troponin I (High Sensitivity): 10138 ng/L (ref <18)

## 2020-12-06 LAB — BASIC METABOLIC PANEL
Anion gap: 7 (ref 5–15)
BUN: 26 mg/dL — ABNORMAL HIGH (ref 8–23)
CO2: 25 mmol/L (ref 22–32)
Calcium: 7.3 mg/dL — ABNORMAL LOW (ref 8.9–10.3)
Chloride: 108 mmol/L (ref 98–111)
Creatinine, Ser: 1.16 mg/dL (ref 0.61–1.24)
GFR, Estimated: >60 mL/min (ref >60)
Glucose, Bld: 188 mg/dL — ABNORMAL HIGH (ref 70–99)
Potassium: 3.4 mmol/L — ABNORMAL LOW (ref 3.5–5.1)
Sodium: 140 mmol/L (ref 135–145)

## 2020-12-06 LAB — HEPARIN LEVEL (UNFRACTIONATED)
Heparin Unfractionated: 0.2 IU/mL — ABNORMAL LOW (ref 0.30–0.70)
Heparin Unfractionated: 0.23 IU/mL — ABNORMAL LOW (ref 0.30–0.70)
Heparin Unfractionated: 0.22 IU/mL — ABNORMAL LOW (ref 0.30–0.70)

## 2020-12-06 LAB — GLUCOSE, CAPILLARY
Glucose-Capillary: 133 mg/dL — ABNORMAL HIGH (ref 70–99)
Glucose-Capillary: 138 mg/dL — ABNORMAL HIGH (ref 70–99)
Glucose-Capillary: 149 mg/dL — ABNORMAL HIGH (ref 70–99)
Glucose-Capillary: 156 mg/dL — ABNORMAL HIGH (ref 70–99)
Glucose-Capillary: 164 mg/dL — ABNORMAL HIGH (ref 70–99)
Glucose-Capillary: 177 mg/dL — ABNORMAL HIGH (ref 70–99)

## 2020-12-06 LAB — MAGNESIUM: Magnesium: 2.2 mg/dL (ref 1.7–2.4)

## 2020-12-06 LAB — PROCALCITONIN: Procalcitonin: 4.22 ng/mL

## 2020-12-06 LAB — LACTIC ACID, PLASMA
Lactic Acid, Venous: 1.4 mmol/L (ref 0.5–1.9)
Lactic Acid, Venous: 1.3 mmol/L (ref 0.5–1.9)

## 2020-12-06 MED ORDER — SODIUM CHLORIDE 0.9 % IV BOLUS
250.0000 mL | Freq: Once | INTRAVENOUS | Status: AC
  Administered 2020-12-06: 250 mL via INTRAVENOUS

## 2020-12-06 MED ORDER — METOPROLOL TARTRATE 5 MG/5ML IV SOLN
5.0000 mg | Freq: Four times a day (QID) | INTRAVENOUS | Status: DC
  Administered 2020-12-06 – 2020-12-08 (×7): 5 mg via INTRAVENOUS
  Filled 2020-12-06 (×8): qty 5

## 2020-12-06 MED ORDER — SODIUM CHLORIDE 0.9% FLUSH
10.0000 mL | Freq: Two times a day (BID) | INTRAVENOUS | Status: DC
  Administered 2020-12-06 – 2020-12-11 (×11): 10 mL

## 2020-12-06 MED ORDER — SODIUM CHLORIDE 0.9% FLUSH: 10.0000 mL | INTRAVENOUS | Status: DC | PRN

## 2020-12-06 MED ORDER — POTASSIUM CHLORIDE 20 MEQ PO PACK
40.0000 meq | PACK | Freq: Once | ORAL | Status: AC
  Administered 2020-12-06: 40 meq
  Filled 2020-12-06: qty 2

## 2020-12-06 NOTE — Progress Notes (Signed)
ANTICOAGULATION CONSULT NOTE  Pharmacy Consult for IV Heparin Indication: chest pain/ACS  No Known Allergies  Patient Measurements: Height: 5' 9.02" (175.3 cm) Weight: 100.8 kg (222 lb 3.6 oz) IBW/kg (Calculated) : 70.74 Heparin Dosing Weight: 91.6 kg  Vital Signs: Temp: 99.4 F (37.4 C) (06/12 1230) Temp Source: Axillary (06/12 1230) BP: 125/65 (06/12 1400) Pulse Rate: 95 (06/12 1400)  Labs: Recent Labs    12/04/20 0927 12/05/20 0044 12/05/20 0134 12/05/20 0250 12/05/20 0505 12/05/20 0756 12/05/20 0943 12/05/20 1516 12/05/20 2304 12/06/20 0509 12/06/20 0510 12/06/20 1300  HGB 10.6*   < > 13.7 14.6  --   --   --   --   --  9.7*  --  9.4*  HCT 35.4*   < > 45.3 43.0  --   --   --   --   --  32.0*  --  31.4*  PLT 182  --  303  --   --   --   --   --   --  170  --  159  HEPARINUNFRC  --   --   --   --   --   --   --    < > 0.13*  --  0.20* 0.23*  CREATININE 0.74  --  1.36*  --   --   --   --   --   --  1.16  --   --   TROPONINIHS  --    < > 5,741*  --  >24,000* >24,000* >24,000*  --   --   --   --   --    < > = values in this interval not displayed.    Estimated Creatinine Clearance: 72.3 mL/min (by C-G formula based on SCr of 1.16 mg/dL).    Assessment: 70 years of age with influenza A and recent stroke s/p TPA on 6/9 at 23:10 PM (now >24hrs from dose given) who now has marked troponin elevations and extensive cardiac history. Pharmacy consulted to start IV Heparin. Patient on ASA and Plavix. CT and MRI with NO bleeding.   Heparin level subtherapeutic (0.23, trending up) after increase to 1650 units/hr. No issues with line or bleeding reported per RN. Planning for cardiac cath in AM 6/13.   Goal of Therapy:  Heparin level 0.3 to 0.5 units/ml due to recent stroke and s/p TPA Monitor platelets by anticoagulation protocol: Yes   Plan:  Increase heparin to 1800 units/hr, no bolus Heparin level in 6 hours.   Link Snuffer, PharmD, BCPS, BCCCP Clinical  Pharmacist Please refer to [facility] for [facility] Pharmacy numbers 12/06/2020 2:04 PM

## 2020-12-06 NOTE — Progress Notes (Signed)
ANTICOAGULATION CONSULT NOTE  Pharmacy Consult for IV Heparin Indication: chest pain/ACS  No Known Allergies  Patient Measurements: Height: 5' 9.02" (175.3 cm) Weight: 100.8 kg (222 lb 3.6 oz) IBW/kg (Calculated) : 70.74 Heparin Dosing Weight: 91.6 kg  Vital Signs: Temp: 98.8 F (37.1 C) (06/12 2011) Temp Source: Oral (06/12 2011) BP: 116/66 (06/12 1900) Pulse Rate: 84 (06/12 1900)  Labs: Recent Labs    12/04/20 0927 12/05/20 0044 12/05/20 0134 12/05/20 0250 12/05/20 0505 12/05/20 0756 12/05/20 0943 12/05/20 1516 12/06/20 0509 12/06/20 0510 12/06/20 1300 12/06/20 2004  HGB 10.6*   < > 13.7 14.6  --   --   --   --  9.7*  --  9.4*  --   HCT 35.4*   < > 45.3 43.0  --   --   --   --  32.0*  --  31.4*  --   PLT 182  --  303  --   --   --   --   --  170  --  159  --   HEPARINUNFRC  --   --   --   --   --   --   --    < >  --  0.20* 0.23* 0.22*  CREATININE 0.74  --  1.36*  --   --   --   --   --  1.16  --   --   --   TROPONINIHS  --    < > 5,741*  --  >24,000* >24,000* >24,000*  --   --   --   --   --    < > = values in this interval not displayed.     Estimated Creatinine Clearance: 72.3 mL/min (by C-G formula based on SCr of 1.16 mg/dL).    Assessment: 70 years of age with influenza A and recent stroke s/p TPA on 6/9 at 23:10 PM (now >24hrs from dose given) who now has marked troponin elevations and extensive cardiac history. Pharmacy consulted to start IV Heparin. Patient on ASA and Plavix. CT and MRI with NO bleeding.   Heparin level still subtherapeutic (0.22) on 1800 units/hr. No bleeding but heparin infusion may have been leaking per RN, switched to different site. Planning for cardiac cath in AM 6/13.   Goal of Therapy:  Heparin level 0.3 to 0.5 units/ml due to recent stroke and s/p TPA Monitor platelets by anticoagulation protocol: Yes   Plan:  Increase heparin to 1900 units/hr Monitor daily HL, CBC/plt Monitor for signs/symptoms of bleeding   Alphia Moh, PharmD, BCPS, BCCP Clinical Pharmacist  Please check AMION for all [facility] Pharmacy phone numbers After 10:00 PM, call Main Pharmacy [phone]

## 2020-12-06 NOTE — Progress Notes (Addendum)
ANTICOAGULATION CONSULT NOTE  Pharmacy Consult for IV Heparin Indication: chest pain/ACS  No Known Allergies  Patient Measurements: Height: 5' 9.02" (175.3 cm) Weight: 100.8 kg (222 lb 3.6 oz) IBW/kg (Calculated) : 70.74 Heparin Dosing Weight: 91.6 kg  Vital Signs: Temp: 98.1 F (36.7 C) (06/11 2334) Temp Source: Oral (06/11 2334) BP: 138/75 (06/11 2130) Pulse Rate: 79 (06/12 0000)  Labs: Recent Labs    12/03/20 0247 12/03/20 0331 12/04/20 0927 12/05/20 0044 12/05/20 0134 12/05/20 0250 12/05/20 0505 12/05/20 0756 12/05/20 0943 12/05/20 1516 12/05/20 2304  HGB 11.9*  --  10.6* 15.3 13.7 14.6  --   --   --   --   --   HCT 38.3*  --  35.4* 45.0 45.3 43.0  --   --   --   --   --   PLT 189  --  182  --  303  --   --   --   --   --   --   HEPARINUNFRC  --   --   --   --   --   --   --   --   --  <0.10* 0.13*  CREATININE 0.74  --  0.74  --  1.36*  --   --   --   --   --   --   TROPONINIHS  --    < >  --   --  5,741*  --  >24,000* >24,000* >24,000*  --   --    < > = values in this interval not displayed.     Estimated Creatinine Clearance: 61.7 mL/min (A) (by C-G formula based on SCr of 1.36 mg/dL (H)).    Assessment: 70 years of age with influenza A and recent stroke s/p TPA on 6/9 at 23:10 PM (now >24hrs from dose given) who now has marked troponin elevations and extensive cardiac history. Pharmacy consulted to start IV Heparin. Patient on ASA and Plavix. CT and MRI with NO bleeding.   Heparin level subtherapeutic (0.13) on gtt at 1300 units/hr. No issues with line or bleeding reported per RN.  Goal of Therapy:  Heparin level 0.3 to 0.5 units/ml due to recent stroke and s/p TPA Monitor platelets by anticoagulation protocol: Yes   Plan:  Increase heparin to 1500 units/hr, no bolus Heparin level in 6 hours.   Christoper Fabian, PharmD, BCPS Please see amion for complete clinical pharmacist phone list 12/06/2020 12:28 AM  Addendum [phone]): Heparin level remains  subtherapeutic (0.2) on gtt at 1500 units/hr. No issues with line or bleeding reported per RN.  Plan: Increase heparin to 1650 units/hr an  f/u 6 hr heparin level.  Christoper Fabian, PharmD, BCPS Please see amion for complete clinical pharmacist phone list 12/06/2020 6:38 AM

## 2020-12-06 NOTE — Progress Notes (Signed)
eLink Physician-Brief Progress Note Patient Name: Brandon Daniel DOB: 10-17-1950 MRN: [id number]   Date of Service  12/06/2020  HPI/Events of Note  Straight cath requested for management of urinary retention.   eICU Interventions  Straight cath ordered.     Intervention Category Intermediate Interventions: Other:  [name] 12/06/2020, 12:16 AM

## 2020-12-06 NOTE — Progress Notes (Signed)
STROKE TEAM PROGRESS NOTE    Interval History   Remains intubated and sedated.  Weaned off Levophed this morning. Had urinary retension over night requiring I/O cath'd.  Troponin 24,000 yesterday.  On heparin gtt for NSTEMI.  Plan for let heart cath tomorrow morning.    Continues to follow commands on the left upper and lower extremity.  No movement on the right.   Pertinent Lab Work and Imaging    12/04/20 CT Head WO IV Contrast 1. No acute intracranial abnormality. 2. ASPECTS is 10.  12/03/20 CT Angio Head and Neck W WO IV Contrast 1. Negative CTA for emergent large vessel occlusion. 2. Extensive bulky calcified plaque about the left carotid bulb/proximal left ICA with associated severe near occlusive stenosis. Left ICA, MCA, and ACA markedly attenuated but remain patent distally. 3. Short-segment 70% atheromatous stenosis at the proximal cervical right ICA. 4. Severe left and moderate right vertebral artery origin stenoses.  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  12/04/20 Echocardiogram Complete  EF 55-60%,   12/04/20 VAS US Carotid  Right ICA 60-79% stenosis, left ICA 80-99% stenosis   Physical Examination   Constitutional: Calm, appropriate for condition  Cardiovascular: Normal RR Respiratory: No increased WOB   Mental status: Patient is intubated and sedated. opens eyes partially to stimulation.  Unable to answer LOC questions due to aphasia and sedated. Follows commands ( squeezes left hand and releases on command and wiggles left toes)  Speech: Globally aphasic.  Cranial nerves: EOMI, VFF, Right facial weakness Motor: LUE 3/5 spontaneous movement, LLE 2/5 spontaneous movement, RUE flickers, RLE 0/5 Sensory: Appears intact to light touch  Coordination: Deferred  Gait: Deferred   Assessment and Plan   [name] is a 70 y.o. male w/pmh of CAD, HTN, lumbar stenosis, HLD who initially presented with  SOB, was found to be Influenza A + and later on day of admission developed mild R facial droop, R arm drift and hand grip weakness along with R leg weakness. Code stroke was initiated and received TPA.   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 CTA Head and Neck was pertinent for LICA with severe stenosis/near occlusion. Suspect L MCA stroke in the setting of atheroembolism however waiting on echo to evaluate for cardiac cause.  LDL 55 Hemoglobin A1C 6.2 vascular surgery consult for LICA stenosis when medically stable from a flu/respiratory standpoint  Diet: NPO, may need NG tube and tube feed recs DVT prophylaxis: SCDs, Heparin infusion Aspirin 81 mg and plavix 75mg  daily prior to admission, now on Heparin infusion Echo: EF 55-60% Therapy recommendations: pending Disposition: pending At discharge will place ambulatory referral to neurology for stroke follow up    NSTEMI  Cardiomyopathy history Elevated troponin 5,741 -> 24.00 Cardiology consulted for New ST depression in V6 and worsening T wave inversion in inferior leads likely demand ischemia in the setting of known coronary artery disease (18 stents) and worsening hypoxia and acidosis Echo with bubble pending  Heparin gtt 1650 units/hr left heart cath on Monday if stable  LICA stenosis Vascular surgery consult pending Pt not medically stable from flu/respiratory standpoint for intervention Holding aspirin and plavix. Currently on Heparin infusion for NSTEMI  Essential Hypertension (history) Home medication: HCTZ 25 QD, Atenolol 100 mg QD, Lisinopril 20 mg QD at home.  SBP goal  90-140/50-85 Change Atenolol to Metoprolol 5mg  q 6hr  Hypotension Required high dose Levophed yesterday, currently weaned off SBP goal 90-140/50-85 NS bolus given today CCM on board  Hyperlipidemia LDL  goal is < 70. LDL is 55 and is at goal.  Atorvastatin 80 mg and zetia 10mg  daily  Acute Respiratory Failure  Prior to admission he had SOB and cough. Noted to be hypoxic in the ED. Was found to be Influenza A positive Intubated on 6/11 for increased WOB and hypoxia CXR bilateral infiltrates and pulmonary edema Albuterol nebs prn, Brovana bid, Duonebs CCM on board  Acute Kidney Injury Creatinine 1.36->1.16 NS infusion Follow creatinine and uop closely CCM on board  Urinary retension Straight cath overnight and this am Required foley placement  Diabetes Hemoglobin A1C 6.2, goal <7 SSI CBGs  Dysphagia  Likely due to stroke SLP assessed him on 6/10, noted dysphagia and recommended NPO.  Place NGT and began enteral nutrition   Influenza A Screened for influenza given recent travel to and ongoing SOB prior to admission. Noted to be febrile and hypoxic this admission. Tamiflu 75 BID for 5 days- 12/03/20 to 12/07/20.   Leukocytosis  Fever  Trend leukocytosis 11.8->21.8 CXR: bilateral infiltrates Increased sputum, culture pending Ceftriaxone 2g q24hours Azithromycin 500mg  q24hrs   Hospital day # 3  Brandon Daniel, ACNP-BC Stroke NP  To contact Stroke Continuity provider, please refer to 02/02/21. After hours, contact General Neurology

## 2020-12-06 NOTE — Consult Note (Signed)
Ref: Pcp, No   Subjective:  Awakens easily. Remains intubated with 50 % FiO2 and 98 % O2 saturation. Improved urine output and creatinine post fluid boluses. Monitor shows normal sinus rhythm.  Objective:  Vital Signs in the last 24 hours: Temp:  [98.1 F (36.7 C)-101 F (38.3 C)] 99.3 F (37.4 C) (06/12 0728) Pulse Rate:  [72-98] 98 (06/12 0756) Cardiac Rhythm: Normal sinus rhythm (06/12 0400) Resp:  [20-27] 26 (06/12 0756) BP: (90-163)/(54-91) 136/71 (06/12 0756) SpO2:  [90 %-100 %] 98 % (06/12 0756) FiO2 (%):  [50 %-70 %] 50 % (06/12 0757)  Physical Exam: BP Readings from Last 1 Encounters:  12/06/20 136/71     Wt Readings from Last 1 Encounters:  12/04/20 100.8 kg    Weight change:  Body mass index is 32.8 kg/m. HEENT: [place]/AT, Eyes-Blue, PERL, Conjunctiva-Pink, Sclera-Non-icteric Neck: No JVD, No bruit, Trachea midline. Lungs:  Clearing, Bilateral. Cardiac:  Regular rhythm, normal S1 and S2, no S3. II/VI systolic murmur. Abdomen:  Soft, non-tender. BS present. Extremities:  No edema present. No cyanosis. No clubbing. CNS: AxOx1, Cranial nerves grossly intact, moves left sided extremities.  Skin: Warm and dry.   Intake/Output from previous day: 06/11 0701 - 06/12 0700 In: 4547.9 [P.O.:240; I.V.:3937.6; IV Piggyback:350.2] Out: 4562 [Urine:1275; Emesis/NG output:80]    Lab Results: BMET    Component Value Date/Time   NA 140 12/06/2020 0509   NA 138 12/05/2020 0250   NA 139 12/05/2020 0134   K 3.4 (L) 12/06/2020 0509   K 4.7 12/05/2020 0250   K 4.6 12/05/2020 0134   CL 108 12/06/2020 0509   CL 99 12/05/2020 0134   CL 100 12/04/2020 0927   CO2 25 12/06/2020 0509   CO2 25 12/05/2020 0134   CO2 27 12/04/2020 0927   GLUCOSE 188 (H) 12/06/2020 0509   GLUCOSE 176 (H) 12/05/2020 0134   GLUCOSE 117 (H) 12/04/2020 0927   BUN 26 (H) 12/06/2020 0509   BUN 21 12/05/2020 0134   BUN 18 12/04/2020 0927   CREATININE 1.16 12/06/2020 0509   CREATININE 1.36 (H)  12/05/2020 0134   CREATININE 0.74 12/04/2020 0927   CALCIUM 7.3 (L) 12/06/2020 0509   CALCIUM 8.6 (L) 12/05/2020 0134   CALCIUM 8.3 (L) 12/04/2020 0927   GFRNONAA >60 12/06/2020 0509   GFRNONAA 57 (L) 12/05/2020 0134   GFRNONAA >60 12/04/2020 0927   CBC    Component Value Date/Time   WBC 6.1 12/06/2020 0509   RBC 3.41 (L) 12/06/2020 0509   HGB 9.7 (L) 12/06/2020 0509   HCT 32.0 (L) 12/06/2020 0509   PLT 170 12/06/2020 0509   MCV 93.8 12/06/2020 0509   MCH 28.4 12/06/2020 0509   MCHC 30.3 12/06/2020 0509   RDW 14.1 12/06/2020 0509   LYMPHSABS 0.6 (L) 12/05/2020 0134   MONOABS 2.0 (H) 12/05/2020 0134   EOSABS 0.0 12/05/2020 0134   BASOSABS 0.0 12/05/2020 0134   HEPATIC Function Panel No results for input(s): PROT in the last 8760 hours.  Invalid input(s):  ALBUMIN,  AST,  ALT,  ALKPHOS,  BILIDIR,  IBILI HEMOGLOBIN A1C No components found for: HGA1C,  MPG CARDIAC ENZYMES No results found for: CKTOTAL, CKMB, CKMBINDEX, TROPONINI BNP No results for input(s): PROBNP in the last 8760 hours. TSH No results for input(s): TSH in the last 8760 hours. CHOLESTEROL Recent Labs    12/04/20 0219  CHOL 108    Scheduled Meds:  arformoterol  15 mcg Nebulization BID   atenolol  100 mg Per  Tube Daily   atorvastatin  80 mg Per Tube Daily   chlorhexidine gluconate (MEDLINE KIT)  15 mL Mouth Rinse BID   Chlorhexidine Gluconate Cloth  6 each Topical Daily   ezetimibe  10 mg Per Tube Daily   gabapentin  600 mg Per Tube BID   ipratropium-albuterol  3 mL Nebulization Q4H   mouth rinse  15 mL Mouth Rinse 10 times per day   montelukast  10 mg Per Tube QHS   oseltamivir  75 mg Per Tube BID   pantoprazole (PROTONIX) IV  40 mg Intravenous QHS   sodium chloride flush  10-40 mL Intracatheter Q12H   Continuous Infusions:  sodium chloride     sodium chloride 10 mL/hr at 12/06/20 0700   azithromycin 500 mg (12/06/20 0704)   cefTRIAXone (ROCEPHIN)  IV Stopped (12/06/20 0028)   fentaNYL  infusion INTRAVENOUS 100 mcg/hr (12/06/20 0700)   heparin 1,650 Units/hr (12/06/20 0700)   norepinephrine (LEVOPHED) Adult infusion 6 mcg/min (12/06/20 0700)   sodium chloride     PRN Meds:.acetaminophen, albuterol, guaiFENesin-dextromethorphan, midazolam, nitroGLYCERIN, ondansetron (ZOFRAN) IV, senna-docusate, sodium chloride flush  Assessment/Plan:  NSTEMI Acute respiratory failure with hypoxia Left ACA, MCA stroke AKI, improving Left pleural effusion Possible septic shock  Plan: Decrease inotropic support as tolerated. Continue heparin, Plavix, betablocker, atorvastatin use. Left heart cath in AM. EKG today.   LOS: 3 days   Time spent including chart review, lab review, examination, discussion with patient/Nurse :  30 min   Dixie Dials  MD  12/06/2020, 8:22 AM

## 2020-12-06 NOTE — Progress Notes (Signed)
NAME:  Brandon Daniel, MRN:  [id number], DOB:  07/19/1950, LOS: 3 ADMISSION DATE:  12/03/2020, CONSULTATION DATE:  12/04/20 REFERRING MD:  Derry Lory, CHIEF COMPLAINT:  weakness   History of Present Illness:  70 year old man with hx of CAD, HTN, HLD admitted for SOB from influenza A on 12/03/20.  Developed R face and R sided weakness this evening and received TPA after normal head CT.  After this he developed some worsening responsiveness so another CT ordered which looks fine. PCCM consulted to monitor airway.  Pertinent  Medical History  Asthma/COPD overlad CAD HLD HTN  Significant Hospital Events: Including procedures, antibiotic start and stop dates in addition to other pertinent events   6/9 admitted 6/9 evening code stroke, tpa, PCCM eval 6/10 Intubated for resp distress 6/11 Lt IJ CVL placed, levo for hypotension, started heparin drip for NSTEMI and cardiology consulted  Interim History / Subjective:   Pressors are weaning down.  Awake, following commands, remains on the ventilator  Objective   Blood pressure 140/69, pulse 98, temperature 99.3 F (37.4 C), temperature source Axillary, resp. rate (!) 24, height 5' 9.02" (1.753 m), weight 100.8 kg, SpO2 96 %.    Vent Mode: PRVC FiO2 (%):  [50 %] (P) 50 % Set Rate:  [24 bmp-26 bmp] 26 bmp Vt Set:  [490 mL] 490 mL PEEP:  [10 cmH20] 10 cmH20 Plateau Pressure:  [22 cmH20-25 cmH20] 25 cmH20   Intake/Output Summary (Last 24 hours) at 12/06/2020 8916 Last data filed at 12/06/2020 0700 Gross per 24 hour  Intake 4011.52 ml  Output 1355 ml  Net 2656.52 ml   Filed Weights   12/03/20 0323 12/03/20 1400 12/04/20 0500  Weight: 102.1 kg 99.2 kg 100.8 kg    Examination: Gen:      No acute distress HEENT:  EOMI, sclera anicteric, ETT Neck:     No masses; no thyromegaly Lungs:   Bilateral rhonchi CV:         Regular rate and rhythm; no murmurs Abd:      + bowel sounds; soft, non-tender; no palpable masses, no distension Ext:    No  edema; adequate peripheral perfusion Skin:      Warm and dry; no rash Neuro: Right hemiparesis, follows commands  Labs/imaging that I havepersonally reviewed  (right click and "Reselect all SmartList Selections" daily)   Potassium 3.4, creatinine 1.16, hemoglobin 9.7  Resolved Hospital Problem list   N/a  Assessment & Plan:  Acute respiratory failure due to inability to clear secretions, acute pulmonary edema, Influenza A pna Follow chest x-ray Holding on Lasix until he is off pressors Continue ceftriaxone, azithromycin Send sputum for culture as he has purulent secretion On Tamiflu for influenza pneumonia  Left ACA, MCA stroke s/p tPA Management per neurology Will need carotid endarterectomy for stenosis when stable  Non ST elevation MI, history of cardiomyopathy, acute pulmonary edema Marked elevation in troponins noted.  He has extensive cardiac history Continue heparin drip, wean down Levophed Holding beta-blockers due to hypotension.  Continue Lipitor, Zetia Cardiology is on board with tentative plans for left heart catheterization on 6/13  Low hemoglobin.  No evidence of active bleed Likely secondary to critical illness Follow CBC  AKI Supportive care, monitor urine output and creatinine  Left pleural effusion Too small to do thoracentesis.  Monitor  Nutrition Tube feeds  Best practice (right click and "Reselect all SmartList Selections" daily)  Diet:  Tube Feed  Pain/Anxiety/Delirium protocol (if indicated): Yes (RASS goal -1) VAP  protocol (if indicated): Yes DVT prophylaxis: Systemic AC GI prophylaxis: PPI Glucose control:  SSI No Central venous access:  Yes, and it is still needed Arterial line:  N/A Foley:  N/A Mobility:  bed rest  PT consulted: N/A Last date of multidisciplinary goals of care discussion [6/11] Code Status:  full code Disposition: ICU  Critical care time:    The patient is critically ill with multiple organ system failure and  requires high complexity decision making for assessment and support, frequent evaluation and titration of therapies, advanced monitoring, review of radiographic studies and interpretation of complex data.   Critical Care Time devoted to patient care services, exclusive of separately billable procedures, described in this note is 40 minutes.   Chilton Greathouse MD [facility] See Amion for pager  If no response to pager , please call [phone] until 7pm After 7:00 pm call Elink  [phone] 12/06/2020, 9:11 AM

## 2020-12-07 ENCOUNTER — Encounter (HOSPITAL_COMMUNITY)
Admission: EM | Disposition: A | Discharge status: Skilled Nursing Facility | Payer: Self-pay | Source: Home / Self Care | Attending: Neurology

## 2020-12-07 ENCOUNTER — Other Ambulatory Visit (HOSPITAL_COMMUNITY): Payer: Medicare (Managed Care)

## 2020-12-07 DIAGNOSIS — J9601 Acute respiratory failure with hypoxia: Secondary | ICD-10-CM

## 2020-12-07 DIAGNOSIS — J101 Influenza due to other identified influenza virus with other respiratory manifestations: Secondary | ICD-10-CM | POA: Diagnosis not present

## 2020-12-07 LAB — MAGNESIUM
Magnesium: 2.6 mg/dL — ABNORMAL HIGH (ref 1.7–2.4)
Magnesium: 2.8 mg/dL — ABNORMAL HIGH (ref 1.7–2.4)

## 2020-12-07 LAB — TROPONIN I (HIGH SENSITIVITY): Troponin I (High Sensitivity): 6694 ng/L (ref <18)

## 2020-12-07 LAB — CBC
HCT: 28.1 % — ABNORMAL LOW (ref 39.0–52.0)
Hemoglobin: 8.5 g/dL — ABNORMAL LOW (ref 13.0–17.0)
MCH: 28.7 pg (ref 26.0–34.0)
MCHC: 30.2 g/dL (ref 30.0–36.0)
MCV: 94.9 fL (ref 80.0–100.0)
Platelets: 153 10*3/uL (ref 150–400)
RBC: 2.96 MIL/uL — ABNORMAL LOW (ref 4.22–5.81)
RDW: 14.3 % (ref 11.5–15.5)
WBC: 5.3 10*3/uL (ref 4.0–10.5)
nRBC: 0 % (ref 0.0–0.2)

## 2020-12-07 LAB — PHOSPHORUS
Phosphorus: 1.9 mg/dL — ABNORMAL LOW (ref 2.5–4.6)
Phosphorus: 2.5 mg/dL (ref 2.5–4.6)

## 2020-12-07 LAB — GLUCOSE, CAPILLARY
Glucose-Capillary: 138 mg/dL — ABNORMAL HIGH (ref 70–99)
Glucose-Capillary: 134 mg/dL — ABNORMAL HIGH (ref 70–99)
Glucose-Capillary: 135 mg/dL — ABNORMAL HIGH (ref 70–99)
Glucose-Capillary: 180 mg/dL — ABNORMAL HIGH (ref 70–99)
Glucose-Capillary: 175 mg/dL — ABNORMAL HIGH (ref 70–99)
Glucose-Capillary: 197 mg/dL — ABNORMAL HIGH (ref 70–99)

## 2020-12-07 LAB — BASIC METABOLIC PANEL
Anion gap: 9 (ref 5–15)
BUN: 28 mg/dL — ABNORMAL HIGH (ref 8–23)
CO2: 26 mmol/L (ref 22–32)
Calcium: 7.5 mg/dL — ABNORMAL LOW (ref 8.9–10.3)
Chloride: 109 mmol/L (ref 98–111)
Creatinine, Ser: 0.94 mg/dL (ref 0.61–1.24)
GFR, Estimated: >60 mL/min (ref >60)
Glucose, Bld: 146 mg/dL — ABNORMAL HIGH (ref 70–99)
Potassium: 3.7 mmol/L (ref 3.5–5.1)
Sodium: 144 mmol/L (ref 135–145)

## 2020-12-07 LAB — HEPARIN LEVEL (UNFRACTIONATED)
Heparin Unfractionated: 0.31 IU/mL (ref 0.30–0.70)
Heparin Unfractionated: 0.3 IU/mL (ref 0.30–0.70)

## 2020-12-07 SURGERY — LEFT HEART CATH AND CORONARY ANGIOGRAPHY
Anesthesia: LOCAL

## 2020-12-07 MED ORDER — INSULIN ASPART 100 UNIT/ML IJ SOLN
0.0000 [IU] | INTRAMUSCULAR | Status: DC
  Administered 2020-12-07 – 2020-12-08 (×4): 3 [IU] via SUBCUTANEOUS
  Administered 2020-12-08 (×2): 2 [IU] via SUBCUTANEOUS
  Administered 2020-12-09: 3 [IU] via SUBCUTANEOUS
  Administered 2020-12-09: 2 [IU] via SUBCUTANEOUS
  Administered 2020-12-09: 3 [IU] via SUBCUTANEOUS
  Administered 2020-12-09: 5 [IU] via SUBCUTANEOUS
  Administered 2020-12-09: 2 [IU] via SUBCUTANEOUS
  Administered 2020-12-10 (×2): 3 [IU] via SUBCUTANEOUS
  Administered 2020-12-10 (×3): 5 [IU] via SUBCUTANEOUS
  Administered 2020-12-10 – 2020-12-11 (×2): 3 [IU] via SUBCUTANEOUS
  Administered 2020-12-11: 2 [IU] via SUBCUTANEOUS
  Administered 2020-12-11: 3 [IU] via SUBCUTANEOUS
  Administered 2020-12-11: 5 [IU] via SUBCUTANEOUS
  Administered 2020-12-11: 3 [IU] via SUBCUTANEOUS
  Administered 2020-12-12 (×4): 5 [IU] via SUBCUTANEOUS
  Administered 2020-12-12: 3 [IU] via SUBCUTANEOUS
  Administered 2020-12-12 – 2020-12-13 (×2): 5 [IU] via SUBCUTANEOUS
  Administered 2020-12-13: 2 [IU] via SUBCUTANEOUS
  Administered 2020-12-13: 5 [IU] via SUBCUTANEOUS
  Administered 2020-12-13: 3 [IU] via SUBCUTANEOUS
  Administered 2020-12-13 (×3): 5 [IU] via SUBCUTANEOUS
  Administered 2020-12-14: 3 [IU] via SUBCUTANEOUS
  Administered 2020-12-14: 8 [IU] via SUBCUTANEOUS
  Administered 2020-12-14: 3 [IU] via SUBCUTANEOUS
  Administered 2020-12-14 – 2020-12-15 (×3): 5 [IU] via SUBCUTANEOUS
  Administered 2020-12-15: 8 [IU] via SUBCUTANEOUS
  Administered 2020-12-15 (×2): 2 [IU] via SUBCUTANEOUS
  Administered 2020-12-15 (×2): 3 [IU] via SUBCUTANEOUS
  Administered 2020-12-16: 5 [IU] via SUBCUTANEOUS
  Administered 2020-12-16 (×2): 3 [IU] via SUBCUTANEOUS
  Administered 2020-12-16: 5 [IU] via SUBCUTANEOUS
  Administered 2020-12-16 – 2020-12-17 (×3): 3 [IU] via SUBCUTANEOUS
  Administered 2020-12-17: 5 [IU] via SUBCUTANEOUS
  Administered 2020-12-17 (×2): 3 [IU] via SUBCUTANEOUS
  Administered 2020-12-18: 5 [IU] via SUBCUTANEOUS
  Administered 2020-12-18 (×2): 3 [IU] via SUBCUTANEOUS
  Administered 2020-12-18: 2 [IU] via SUBCUTANEOUS
  Administered 2020-12-18 (×2): 3 [IU] via SUBCUTANEOUS
  Administered 2020-12-18: 5 [IU] via SUBCUTANEOUS
  Administered 2020-12-19 (×3): 3 [IU] via SUBCUTANEOUS
  Administered 2020-12-19: 2 [IU] via SUBCUTANEOUS
  Administered 2020-12-19: 5 [IU] via SUBCUTANEOUS
  Administered 2020-12-19 – 2020-12-20 (×2): 3 [IU] via SUBCUTANEOUS
  Administered 2020-12-20: 2 [IU] via SUBCUTANEOUS
  Administered 2020-12-20: 5 [IU] via SUBCUTANEOUS
  Administered 2020-12-20: 3 [IU] via SUBCUTANEOUS
  Administered 2020-12-20: 5 [IU] via SUBCUTANEOUS
  Administered 2020-12-21 (×5): 3 [IU] via SUBCUTANEOUS
  Administered 2020-12-22: 2 [IU] via SUBCUTANEOUS
  Administered 2020-12-22: 5 [IU] via SUBCUTANEOUS
  Administered 2020-12-22 – 2020-12-23 (×7): 3 [IU] via SUBCUTANEOUS
  Administered 2020-12-23: 5 [IU] via SUBCUTANEOUS
  Administered 2020-12-23: 3 [IU] via SUBCUTANEOUS
  Administered 2020-12-24: 2 [IU] via SUBCUTANEOUS
  Administered 2020-12-24 – 2020-12-25 (×5): 3 [IU] via SUBCUTANEOUS
  Administered 2020-12-25: 5 [IU] via SUBCUTANEOUS
  Administered 2020-12-25: 3 [IU] via SUBCUTANEOUS
  Administered 2020-12-25: 2 [IU] via SUBCUTANEOUS
  Administered 2020-12-25 – 2020-12-26 (×3): 3 [IU] via SUBCUTANEOUS

## 2020-12-07 MED ORDER — IPRATROPIUM-ALBUTEROL 0.5-2.5 (3) MG/3ML IN SOLN
3.0000 mL | Freq: Two times a day (BID) | RESPIRATORY_TRACT | Status: DC
  Administered 2020-12-07 – 2020-12-12 (×11): 3 mL via RESPIRATORY_TRACT
  Filled 2020-12-07 (×11): qty 3

## 2020-12-07 MED ORDER — VITAL 1.5 CAL PO LIQD
1000.0000 mL | ORAL | Status: DC
  Administered 2020-12-07 – 2020-12-10 (×4): 1000 mL

## 2020-12-07 MED ORDER — POTASSIUM PHOSPHATES 15 MMOLE/5ML IV SOLN
30.0000 mmol | Freq: Once | INTRAVENOUS | Status: AC
  Administered 2020-12-07: 30 mmol via INTRAVENOUS
  Filled 2020-12-07: qty 10

## 2020-12-07 MED ORDER — PROSOURCE TF PO LIQD
45.0000 mL | Freq: Three times a day (TID) | ORAL | Status: DC
  Administered 2020-12-07 – 2020-12-25 (×54): 45 mL
  Filled 2020-12-07 (×54): qty 45

## 2020-12-07 NOTE — Progress Notes (Addendum)
STROKE TEAM PROGRESS NOTE    Interval History   Remains intubated and sedated. Pressures have remained stable off Levophed that was stopped on 12/06/20.  On heparin gtt for NSTEMI, possibly heart cath will be done today  Continues to follow commands on the left upper and lower extremity.  No movement on the right.   Pertinent Lab Work and Imaging    12/04/20 CT Head WO IV Contrast 1. No acute intracranial abnormality. 2. ASPECTS is 10.  12/03/20 CT Angio Head and Neck W WO IV Contrast 1. Negative CTA for emergent large vessel occlusion. 2. Extensive bulky calcified plaque about the left carotid bulb/proximal left ICA with associated severe near occlusive stenosis. Left ICA, MCA, and ACA markedly attenuated but remain patent distally. 3. Short-segment 70% atheromatous stenosis at the proximal cervical right ICA. 4. Severe left and moderate right vertebral artery origin stenoses.  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  12/04/20 Echocardiogram Complete  1. Left ventricular ejection fraction, by estimation, is 55 to 60%. The left ventricle has normal function. The left ventricle has no regional wall motion abnormalities. Left ventricular diastolic parameters are  indeterminate. Elevated left ventricular end-diastolic pressure.   2. Right ventricular systolic function is normal. The right ventricular size is normal. There is normal pulmonary artery systolic pressure. The estimated right ventricular systolic pressure is 11.0 mmHg.   3. The mitral valve is normal in structure. No evidence of mitral valve regurgitation. No evidence of mitral stenosis.   4. The aortic valve was not well visualized. Aortic valve regurgitation is not visualized. No aortic stenosis is present.   5. The inferior vena cava is normal in size with greater than 50% respiratory variability, suggesting right atrial pressure of 3 mmHg.   12/04/20 VAS US  Carotid  Right ICA 60-79% stenosis, left ICA 80-99% stenosis   Physical Examination   Constitutional: Calm, appropriate for condition  Cardiovascular: Normal RR Respiratory: No increased WOB   Mental status: Patient is intubated and sedated. opens eyes partially to stimulation.  Unable to answer LOC questions due to aphasia and sedated. Follows commands ( squeezes left hand and releases on command and wiggles left toes)  Speech: Globally aphasic.  Cranial nerves: EOMI, VFF, Right facial weakness Motor: LUE 3/5 spontaneous movement, LLE 2/5 spontaneous movement, RUE flickers, RLE 0/5 Sensory: Appears intact to light touch  Coordination: Deferred  Gait: Deferred   Assessment and Plan   Mr. Tyberius Ryner is a 70 y.o. male w/pmh of CAD, HTN, lumbar stenosis, HLD who initially presented with SOB, was found to be Influenza A + and later on day of admission developed mild R facial droop, R arm drift and hand grip weakness along with R leg weakness. Code stroke was initiated and received TPA.   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 CTA Head and Neck was pertinent for LICA with severe stenosis/near occlusion. Suspect L MCA stroke in the setting of atheroembolism however waiting on echo to evaluate for cardiac cause.  LDL 55 Hemoglobin A1C 6.2 DVT prophylaxis: SCDs, Heparin infusion Aspirin 81 mg and plavix 75mg  daily prior to admission, now on Heparin infusion Echo: EF 55-60%, LA normal in size  Therapy recommendations: pending Disposition: pending At discharge will place ambulatory referral to neurology for stroke follow up   LICA stenosis Neuro IR  consulted for LICA stenosis on 6/13, of note patient is not medically stable from flu/respiratory standpoint for intervention   NSTEMI  Cardiomyopathy history Elevated troponin 5,741 ->  24.00 Cardiology consulted for New ST depression in V6 and worsening T wave inversion in inferior leads; likely demand ischemia in the setting of known coronary artery disease (18 stents) and worsening hypoxia and acidosis Currently on Heparin gtt for NSTEMI, Cards also recommending Plavix in addition to Heparin, are ok with initiating Plavix if need be  Left heart cath possibly will be done today   Essential Hypertension (history) Home medication: HCTZ 25 QD, Atenolol 100 mg QD, Lisinopril 20 mg QD at home.  SBP goal 90-140/50-85 Metoprolol 5mg  q 6hr initiated thus far this admission   Hypotension SBP goal 90-140/50-85 CCM on board to address/manage hypotension with pressors/boluses   Hyperlipidemia LDL goal is < 70. LDL is 55 and is at goal.  Atorvastatin 80 mg and Zetia 10mg  daily  Acute Respiratory Failure  Prior to admission he had SOB and cough. Noted to be hypoxic in the ED. Was found to be Influenza A positive Intubated on 6/11 for increased WOB and hypoxia CXR bilateral infiltrates and pulmonary edema Albuterol nebs prn, Brovana bid, Duonebs CCM on board  Acute Kidney Injury Creatinine 1.36->1.16 NS infusion Follow creatinine and uop closely CCM on board  Urinary retension Straight cath overnight and this am Required foley placement  Diabetes Hemoglobin A1C 6.2, goal <7 SSI CBGs  Dysphagia  Likely due to stroke SLP assessed him on 6/10, noted dysphagia and recommended NPO.  Has OG tube, plan to begin enteral nutrition depending on if he goes for heart cath today or not   Influenza A Screened for influenza given recent travel to and ongoing SOB prior to admission. Noted to be febrile and hypoxic this admission. Tamiflu 75 BID for 5 days- 12/03/20 to 12/07/20.   Leukocytosis  Fever  Trend leukocytosis 11.8->21.8->5.3 CXR: Persistent interstitial and airspace opacities Increased sputum- culture w/moderate WBC few gram positive and negative rods   Ceftriaxone 2g q24hours Azithromycin 500mg  q24hrs   Hospital day # 4  02/02/21, AGPCNP-BC  Stroke NP  ATTENDING NOTE: I reviewed above note and agree with the assessment and plan. Pt was seen and examined.   70 year old male with history of CAD, HTN, HLD admitted for shortness of breath and influenza infection.  However developed right facial droop and right-sided weakness during admission.  CT no acute abnormality.  Status post tPA.  CTA head and neck showed left ICA near occlusion, right ICA 70% stenosis.  MRI showed left MCA and ACA scattered patchy infarcts.  LDL 55, A1c 6.2, EF 55 to .  Carotid Doppler right ICA 60 to 79% stenosis, left 80 to 99% stenosis.  Patient also developed elevated troponin and EKG changes.  Cardiology on board, concerning for non-STEMI, put on heparin IV at this time.  Plan for cardiac cath today, however, procedure postponed due to anemia.  Interventional radiology consulted for left ICA near occlusion, currently patient not a candidate for intervention, but they will follow.  Patient also developed hypotension, was on Levophed, but now off with improved BP.  Still has fever, T-max today 101.6, CXR concerning for bilateral pneumonia, on Rocephin and azithromycin.  WBC now normalized at 5.3.  On exam, patient nephew is at bedside, patient still intubated, on fentanyl, eyes open on voice, follows some commands on the left hand and foot.  Left gaze preference, not cross midline.  Blinking to visual threat on the left, however, inconsistent on the right.  PERRL.  Facial symmetry not able to examine due to ET tube.  Left upper extremity against gravity without drift, left lower extremity 2+/5 with stimulation.  Right upper and lower extremity flaccid. Sensation, coordination and gait not tested.  Etiology for patient stroke likely due to left ICA near occlusion, currently interventional radiology on board, not a candidate for intervention at this moment.   Pending cardiac cath for cardiac work-up.  Management of pneumonia/fever per CCM.  Avoid low BP, continue heparin IV.  Cardiology mentioned Plavix in the note, however, will hold off at this moment due to worsening anemia. Continue Lipitor and Zetia.  For detailed assessment and plan, please refer to above as I have made changes wherever appropriate.   Marvel Plan, MD PhD Stroke Neurology 12/07/2020 6:32 PM  This patient is critically ill due to left MCA and ACA infarcts, non-STEMI, pneumonia with fever, respiratory failure intubated and at significant risk of neurological worsening, death form sepsis, septic shock, recurrent stroke, cardiac arrest, seizure. This patient's care requires constant monitoring of vital signs, hemodynamics, respiratory and cardiac monitoring, review of multiple databases, neurological assessment, discussion with family, other specialists and medical decision making of high complexity. I spent 50 minutes of neurocritical care time in the care of this patient. I had long discussion with nephew at bedside, updated pt current condition, treatment plan and potential prognosis, and answered all the questions.  He expressed understanding and appreciation.      To contact Stroke Continuity provider, please refer to [email or web address]. After hours, contact General Neurology

## 2020-12-07 NOTE — Plan of Care (Signed)
  Problem: Education: Goal: Knowledge of General Education information will improve Description: Including pain rating scale, medication(s)/side effects and non-pharmacologic comfort measures Outcome: Progressing   Problem: Health Behavior/Discharge Planning: Goal: Ability to manage health-related needs will improve Outcome: Progressing   Problem: Clinical Measurements: Goal: Ability to maintain clinical measurements within normal limits will improve Outcome: Progressing Goal: Will remain free from infection Outcome: Progressing Goal: Diagnostic test results will improve Outcome: Progressing Goal: Respiratory complications will improve Outcome: Progressing Goal: Cardiovascular complication will be avoided Outcome: Progressing   Problem: Activity: Goal: Risk for activity intolerance will decrease Outcome: Progressing   Problem: Nutrition: Goal: Adequate nutrition will be maintained Outcome: Progressing   Problem: Coping: Goal: Level of anxiety will decrease Outcome: Progressing   Problem: Elimination: Goal: Will not experience complications related to bowel motility Outcome: Progressing Goal: Will not experience complications related to urinary retention Outcome: Progressing   Problem: Pain Managment: Goal: General experience of comfort will improve Outcome: Progressing   Problem: Safety: Goal: Ability to remain free from injury will improve Outcome: Progressing   Problem: Skin Integrity: Goal: Risk for impaired skin integrity will decrease Outcome: Progressing   Problem: Education: Goal: Knowledge of disease or condition will improve Outcome: Progressing Goal: Knowledge of secondary prevention will improve Outcome: Progressing Goal: Knowledge of patient specific risk factors addressed and post discharge goals established will improve Outcome: Progressing   Problem: Coping: Goal: Will verbalize positive feelings about self Outcome: Progressing Goal: Will  identify appropriate support needs Outcome: Progressing   Problem: Health Behavior/Discharge Planning: Goal: Ability to manage health-related needs will improve Outcome: Progressing   Problem: Self-Care: Goal: Ability to participate in self-care as condition permits will improve Outcome: Progressing Goal: Verbalization of feelings and concerns over difficulty with self-care will improve Outcome: Progressing Goal: Ability to communicate needs accurately will improve Outcome: Progressing   Problem: Nutrition: Goal: Risk of aspiration will decrease Outcome: Progressing Goal: Dietary intake will improve Outcome: Progressing   Problem: Ischemic Stroke/TIA Tissue Perfusion: Goal: Complications of ischemic stroke/TIA will be minimized Outcome: Progressing   

## 2020-12-07 NOTE — Progress Notes (Addendum)
NAME:  Abijah Roussel, MRN:  [id number], DOB:  Oct 20, 1950, LOS: 4 ADMISSION DATE:  12/03/2020, CONSULTATION DATE:  12/04/20 REFERRING MD:  Derry Lory, CHIEF COMPLAINT:  weakness   History of Present Illness:  70 year old man with hx of CAD, HTN, HLD admitted for SOB from influenza A on 12/03/20.  Developed R face and R sided weakness this evening and received TPA after normal head CT.  After this he developed some worsening responsiveness so another CT ordered which looks fine. PCCM consulted to monitor airway.  Pertinent  Medical History  Asthma/COPD overlad CAD HLD HTN  Significant Hospital Events: Including procedures, antibiotic start and stop dates in addition to other pertinent events   6/9 admitted 6/9 evening code stroke, tpa, PCCM eval 6/10 Intubated for resp distress 6/11 Lt IJ CVL placed, levo for hypotension, started heparin drip for NSTEMI and cardiology consulted  Interim History / Subjective:   Off pressors. Awake, following commands, remains on the ventilator. Right hemiparesis present. On higher settings than possible for sbt.    Objective   Blood pressure (!) 144/69, pulse 91, temperature 99.1 F (37.3 C), temperature source Oral, resp. rate (!) 26, height 5' 9.02" (1.753 m), weight 100.8 kg, SpO2 96 %. CVP:  [0 mmHg-11 mmHg] 8 mmHg  Vent Mode: PRVC FiO2 (%):  [30 %-50 %] 30 % Set Rate:  [26 bmp] 26 bmp Vt Set:  [490 mL] 490 mL PEEP:  [8 cmH20-10 cmH20] 8 cmH20 Plateau Pressure:  [24 cmH20-25 cmH20] 24 cmH20   Intake/Output Summary (Last 24 hours) at 12/07/2020 0856 Last data filed at 12/07/2020 0800 Gross per 24 hour  Intake 1460.19 ml  Output 1925 ml  Net -464.81 ml   Filed Weights   12/03/20 0323 12/03/20 1400 12/04/20 0500  Weight: 102.1 kg 99.2 kg 100.8 kg    Examination: Gen:      No acute distress HEENT:  EOMI, sclera anicteric, ETT Neck:     No masses; no thyromegaly Lungs:   Bilateral rhonchi CV:         Regular rate and rhythm; no  murmurs Abd:      + bowel sounds; soft, non-tender; no palpable masses, no distension Ext:    No edema; adequate peripheral perfusion Skin:      Warm and dry; no rash Neuro: Right hemiparesis, follows commands.  Appropriate accommodation in both eyes. R eye with right deviation.  Labs/imaging that I havepersonally reviewed  (right click and "Reselect all SmartList Selections" daily)  Na 144 Potassium 3.7 creatinine 0.94 hemoglobin 8.5  Resolved Hospital Problem list   N/a  Assessment & Plan:  Acute respiratory failure due to inability to clear secretions, acute pulmonary edema, Influenza A pna Follow chest x-ray Holding on Lasix until he is off pressors Continue ceftriaxone, azithromycin Tracheal aspirate culture pending Continue Tamiflu for influenza pneumonia  Left ACA, MCA stroke s/p tPA Management per neurology Will need carotid endarterectomy for stenosis when stable  Non ST elevation MI, history of cardiomyopathy, acute pulmonary edema Marked elevation in troponins noted.  He has extensive cardiac history Cardiology following  Continue heparin drip, wean down Levophed Holding beta-blockers due to hypotension. Continue Lipitor, Zetia Per Cardiology note, tentative plans for left heart catheterization on 6/13  Low hemoglobin.  No evidence of active bleed Likely secondary to critical illness Follow CBC 6/13 Hgb 8.5, presentation no transfusion needed at this time.  Hemoglobin has been downtrending. Consider transfusion if <7 or patient presentation worsens  AKI, improving 6/13 Cr  0.94 Supportive care, monitor urine output and creatinine UOP of 1925 ml Electrolyte replacement per protocol    Left pleural effusion Too small to do thoracentesis.  Monitor  Nutrition Tube feeds  Best practice (right click and "Reselect all SmartList Selections" daily)  Diet:  Tube Feed  Pain/Anxiety/Delirium protocol (if indicated): Yes (RASS goal -1) VAP protocol (if indicated):  Yes DVT prophylaxis: Systemic AC GI prophylaxis: PPI Glucose control:  SSI No Central venous access:  Yes, and it is still needed Arterial line:  N/A Foley:  N/A Mobility:  bed rest  PT consulted: N/A Last date of multidisciplinary goals of care discussion [6/11] Code Status:  full code Disposition: ICU  Critical care time: The patient is critically ill with multiple organ systems failure and requires high complexity decision making for assessment and support, frequent evaluation and titration of therapies, application of advanced monitoring technologies and extensive interpretation of multiple databases.  Critical care time 35 mins. This represents my time independent of the NPs time taking care of the pt. This is excluding procedures.    Briant Sites DO [facility] 12/07/2020, 4:04 PM See Amion for pager If no response to pager, please call [phone] until 1900 After 1900 please call [facility] [phone]

## 2020-12-07 NOTE — Progress Notes (Signed)
ANTICOAGULATION CONSULT NOTE  Pharmacy Consult for IV Heparin Indication: chest pain/ACS  No Known Allergies  Patient Measurements: Height: 5' 9.02" (175.3 cm) Weight: 100.8 kg (222 lb 3.6 oz) IBW/kg (Calculated) : 70.74 Heparin Dosing Weight: 91.6 kg  Vital Signs: Temp: 99.1 F (37.3 C) (06/13 0400) Temp Source: Oral (06/13 0400) BP: 130/64 (06/13 0630) Pulse Rate: 76 (06/13 0630)  Labs: Recent Labs    12/05/20 0134 12/05/20 0250 12/05/20 0943 12/05/20 1516 12/06/20 0509 12/06/20 0510 12/06/20 1300 12/06/20 2004 12/07/20 0353 12/07/20 0636  HGB 13.7   < >  --   --  9.7*  --  9.4*  --  8.5*  --   HCT 45.3   < >  --   --  32.0*  --  31.4*  --  28.1*  --   PLT 303  --   --   --  170  --  159  --  153  --   HEPARINUNFRC  --   --   --    < >  --    < > 0.23* 0.22* 0.31  --   CREATININE 1.36*  --   --   --  1.16  --   --   --  0.94  --   TROPONINIHS 5,741*   < > >24,000*  --   --   --   --  10,138*  --  6,694*   < > = values in this interval not displayed.     Estimated Creatinine Clearance: 89.2 mL/min (by C-G formula based on SCr of 0.94 mg/dL).    Assessment: 70 years of age with influenza A and recent stroke s/p TPA on 6/9 at 23:10 PM (now >24hrs from dose given) who now has marked troponin elevations and extensive cardiac history. Pharmacy consulted to start IV Heparin. ASA and Plavix held with heparin initiation. CT and MRI with NO bleeding.   Heparin level this morning is therapeutic x 2 (HL 0.31 << 0.3, goal of 0.3-0.5). Hgb slow decline to 8.5 - no active bleeding noted. CCM aware - will continue to monitor closely. Planning eventual LHC - will follow-up on plans for this.   Goal of Therapy:  Heparin level 0.3 to 0.5 units/ml due to recent stroke and s/p TPA Monitor platelets by anticoagulation protocol: Yes   Plan:  Continue heparin to 1900 units/hr Monitor daily HL, CBC/plt Monitor for signs/symptoms of bleeding   Thank you for allowing pharmacy to  be a part of this patient's care.  Georgina Pillion, PharmD, BCPS Clinical Pharmacist Clinical phone for 12/07/2020: [phone] 12/07/2020 11:01 AM   **Pharmacist phone directory can now be found on [email or web address] ([email or web address]).  Listed under [facility].

## 2020-12-07 NOTE — Progress Notes (Signed)
Nutrition Follow-up  DOCUMENTATION CODES:   Obesity unspecified  INTERVENTION:   Initiate tube feeds via OG tube: - Vital 1.5 @ 55 ml/hr (1320 ml/day) - ProSource TF 45 ml TID  Tube feeding regimen provides 2100 kcal, 122 grams of protein, and 1008 ml of H2O.  NUTRITION DIAGNOSIS:   Inadequate oral intake related to inability to eat as evidenced by NPO status.  Progressing with initiation of tube feeds  GOAL:   Patient will meet greater than or equal to 90% of their needs  Met via tube feeds  MONITOR:   Diet advancement, Labs, Weight trends, I & O's  REASON FOR ASSESSMENT:   Ventilator, Consult Enteral/tube feeding initiation and management  ASSESSMENT:   70 year old male who presented to the ED on 6/09 with SOB. 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 Pt found to have NSTEMI and influenza A. Pt developed R facial droop, R arm drift and hand grip weakness along with R leg weakness and pt likely with CVA s/p tPA.  6/11 - intubated  Discussed pt with RN and during ICU rounds. Plan is for [facility] today per Cardiology. Pt is off pressors.  Received consult for tube feeding initiation and management. Pt with OG tube in place. Discussed plan for tube feeds with RN.  Admit weight: 99.2 kg Current weight: 100.8 kg  Patient is currently intubated on ventilator support MV: 13.2 L/min Temp (24hrs), Avg:99 F (37.2 C), Min:98.2 F (36.8 C), Max:100.6 F (38.1 C)  Drips: Fentanyl Heparin  Medications reviewed and include: tamiflu, IV protonix, IV abx, IV potassium phosphate 30 mmol once  Labs reviewed: BUN 28, phosphorus 1.9 (being replaced), magnesium 2.6, hemoglobin 8.5 CBG's: 133-164 x 24 hours  UOP: 1925 ml x 24 hours I/O's: +4.3 L since admit  Diet Order:   Diet Order             Diet NPO time specified  Diet effective now                   EDUCATION NEEDS:   No education needs have been identified at this time  Skin:  Skin  Assessment: Reviewed RN Assessment  Last BM:  no documented BM  Height:   Ht Readings from Last 1 Encounters:  12/05/20 5' 9.02" (1.753 m)    Weight:   Wt Readings from Last 1 Encounters:  12/04/20 100.8 kg    BMI:  Body mass index is 32.8 kg/m.  Estimated Nutritional Needs:   Kcal:  2150  Protein:  100-125 grams  Fluid:  >/= 2.0 L    Gustavus Bryant, MS, RD, LDN Inpatient Clinical Dietitian Please see AMiON for contact information.

## 2020-12-07 NOTE — Progress Notes (Signed)
Pharmacy Electrolyte Replacement  Recent Labs:  Recent Labs    12/07/20 0353  K 3.7  MG 2.6*  PHOS 1.9*  CREATININE 0.94    Low Critical Values (K </= 2.5, Phos </= 1, Mg </= 1) Present: None  Plan:  - Phos 1.9, K 3.7 - KPhos 30 mmol x 1 - Recheck K/Phos with AM labs  Thank you for allowing pharmacy to be a part of this patient's care.  Georgina Pillion, PharmD, BCPS Clinical Pharmacist Clinical phone for 12/07/2020: [phone] 12/07/2020 9:32 AM   **Pharmacist phone directory can now be found on [email or web address] ([email or web address]).  Listed under [facility].

## 2020-12-08 ENCOUNTER — Inpatient Hospital Stay (HOSPITAL_COMMUNITY): Payer: Medicare (Managed Care)

## 2020-12-08 ENCOUNTER — Encounter (HOSPITAL_COMMUNITY)
Admission: EM | Disposition: A | Discharge status: Skilled Nursing Facility | Payer: Self-pay | Source: Home / Self Care | Attending: Neurology

## 2020-12-08 DIAGNOSIS — J9601 Acute respiratory failure with hypoxia: Secondary | ICD-10-CM | POA: Diagnosis not present

## 2020-12-08 DIAGNOSIS — J101 Influenza due to other identified influenza virus with other respiratory manifestations: Secondary | ICD-10-CM | POA: Diagnosis not present

## 2020-12-08 HISTORY — PX: LEFT HEART CATH AND CORS/GRAFTS ANGIOGRAPHY: CATH118250

## 2020-12-08 LAB — BASIC METABOLIC PANEL
Anion gap: 5 (ref 5–15)
BUN: 26 mg/dL — ABNORMAL HIGH (ref 8–23)
CO2: 27 mmol/L (ref 22–32)
Calcium: 7.7 mg/dL — ABNORMAL LOW (ref 8.9–10.3)
Chloride: 111 mmol/L (ref 98–111)
Creatinine, Ser: 0.77 mg/dL (ref 0.61–1.24)
GFR, Estimated: >60 mL/min (ref >60)
Glucose, Bld: 134 mg/dL — ABNORMAL HIGH (ref 70–99)
Potassium: 3.3 mmol/L — ABNORMAL LOW (ref 3.5–5.1)
Sodium: 143 mmol/L (ref 135–145)

## 2020-12-08 LAB — CULTURE, RESPIRATORY W GRAM STAIN: Culture: NORMAL

## 2020-12-08 LAB — GLUCOSE, CAPILLARY
Glucose-Capillary: 145 mg/dL — ABNORMAL HIGH (ref 70–99)
Glucose-Capillary: 110 mg/dL — ABNORMAL HIGH (ref 70–99)
Glucose-Capillary: 115 mg/dL — ABNORMAL HIGH (ref 70–99)
Glucose-Capillary: 117 mg/dL — ABNORMAL HIGH (ref 70–99)
Glucose-Capillary: 136 mg/dL — ABNORMAL HIGH (ref 70–99)
Glucose-Capillary: 192 mg/dL — ABNORMAL HIGH (ref 70–99)

## 2020-12-08 LAB — URINALYSIS, ROUTINE W REFLEX MICROSCOPIC
Bilirubin Urine: NEGATIVE
Glucose, UA: NEGATIVE mg/dL
Hgb urine dipstick: NEGATIVE
Ketones, ur: NEGATIVE mg/dL
Leukocytes,Ua: NEGATIVE
Nitrite: NEGATIVE
Protein, ur: NEGATIVE mg/dL
Specific Gravity, Urine: >1.046 — ABNORMAL HIGH (ref 1.005–1.030)
pH: 6 (ref 5.0–8.0)

## 2020-12-08 LAB — CBC
HCT: 28.6 % — ABNORMAL LOW (ref 39.0–52.0)
Hemoglobin: 8.6 g/dL — ABNORMAL LOW (ref 13.0–17.0)
MCH: 28.3 pg (ref 26.0–34.0)
MCHC: 30.1 g/dL (ref 30.0–36.0)
MCV: 94.1 fL (ref 80.0–100.0)
Platelets: 158 10*3/uL (ref 150–400)
RBC: 3.04 MIL/uL — ABNORMAL LOW (ref 4.22–5.81)
RDW: 14.6 % (ref 11.5–15.5)
WBC: 4.8 10*3/uL (ref 4.0–10.5)
nRBC: 0 % (ref 0.0–0.2)

## 2020-12-08 LAB — PHOSPHORUS
Phosphorus: 2.4 mg/dL — ABNORMAL LOW (ref 2.5–4.6)
Phosphorus: 3 mg/dL (ref 2.5–4.6)

## 2020-12-08 LAB — MAGNESIUM
Magnesium: 2.5 mg/dL — ABNORMAL HIGH (ref 1.7–2.4)
Magnesium: 2.5 mg/dL — ABNORMAL HIGH (ref 1.7–2.4)

## 2020-12-08 LAB — HEPARIN LEVEL (UNFRACTIONATED): Heparin Unfractionated: 0.21 IU/mL — ABNORMAL LOW (ref 0.30–0.70)

## 2020-12-08 SURGERY — LEFT HEART CATH AND CORS/GRAFTS ANGIOGRAPHY
Anesthesia: LOCAL

## 2020-12-08 MED ORDER — METOPROLOL TARTRATE 25 MG/10 ML ORAL SUSPENSION
25.0000 mg | Freq: Two times a day (BID) | ORAL | Status: DC
  Administered 2020-12-08 – 2020-12-25 (×33): 25 mg
  Filled 2020-12-08 (×38): qty 10

## 2020-12-08 MED ORDER — SODIUM CHLORIDE 0.9% FLUSH
3.0000 mL | Freq: Two times a day (BID) | INTRAVENOUS | Status: DC
  Administered 2020-12-08 – 2021-01-11 (×63): 3 mL via INTRAVENOUS

## 2020-12-08 MED ORDER — POLYETHYLENE GLYCOL 3350 17 G PO PACK
17.0000 g | PACK | Freq: Every day | ORAL | Status: DC
  Administered 2020-12-09 – 2020-12-19 (×11): 17 g
  Filled 2020-12-08 (×12): qty 1

## 2020-12-08 MED ORDER — HEPARIN (PORCINE) IN NACL 1000-0.9 UT/500ML-% IV SOLN
INTRAVENOUS | Status: DC | PRN
  Administered 2020-12-08: 500 mL

## 2020-12-08 MED ORDER — SODIUM CHLORIDE 0.9% FLUSH
3.0000 mL | INTRAVENOUS | Status: DC | PRN
  Administered 2020-12-17 – 2020-12-29 (×2): 3 mL via INTRAVENOUS

## 2020-12-08 MED ORDER — SODIUM CHLORIDE 0.9 % IV SOLN
2.0000 g | INTRAVENOUS | Status: AC
  Administered 2020-12-08 – 2020-12-09 (×2): 2 g via INTRAVENOUS
  Filled 2020-12-08 (×2): qty 20

## 2020-12-08 MED ORDER — ASPIRIN 81 MG PO CHEW
81.0000 mg | CHEWABLE_TABLET | Freq: Every day | ORAL | Status: DC
  Administered 2020-12-08 – 2020-12-18 (×11): 81 mg
  Filled 2020-12-08 (×11): qty 1

## 2020-12-08 MED ORDER — HEPARIN (PORCINE) IN NACL 1000-0.9 UT/500ML-% IV SOLN
INTRAVENOUS | Status: AC
  Filled 2020-12-08: qty 1000

## 2020-12-08 MED ORDER — LIDOCAINE HCL (PF) 1 % IJ SOLN
INTRAMUSCULAR | Status: DC | PRN
  Administered 2020-12-08: 15 mL

## 2020-12-08 MED ORDER — LABETALOL HCL 5 MG/ML IV SOLN: 10.0000 mg | INTRAVENOUS | Status: AC | PRN

## 2020-12-08 MED ORDER — SODIUM CHLORIDE 0.9 % IV SOLN: 250.0000 mL | INTRAVENOUS | Status: DC | PRN

## 2020-12-08 MED ORDER — LIDOCAINE HCL (PF) 1 % IJ SOLN
INTRAMUSCULAR | Status: AC
  Filled 2020-12-08: qty 30

## 2020-12-08 MED ORDER — MIDAZOLAM HCL 2 MG/2ML IJ SOLN
INTRAMUSCULAR | Status: AC
  Filled 2020-12-08: qty 2

## 2020-12-08 MED ORDER — HYDRALAZINE HCL 20 MG/ML IJ SOLN: 10.0000 mg | INTRAMUSCULAR | Status: AC | PRN

## 2020-12-08 MED ORDER — POTASSIUM CHLORIDE 20 MEQ PO PACK
20.0000 meq | PACK | ORAL | Status: AC
  Administered 2020-12-08 (×2): 20 meq
  Filled 2020-12-08 (×2): qty 1

## 2020-12-08 MED ORDER — CLOPIDOGREL BISULFATE 75 MG PO TABS
75.0000 mg | ORAL_TABLET | Freq: Every day | ORAL | Status: DC
  Administered 2020-12-08 – 2020-12-18 (×11): 75 mg
  Filled 2020-12-08 (×11): qty 1

## 2020-12-08 MED ORDER — POTASSIUM CHLORIDE 10 MEQ/50ML IV SOLN
10.0000 meq | INTRAVENOUS | Status: AC
  Administered 2020-12-08 (×4): 10 meq via INTRAVENOUS
  Filled 2020-12-08 (×4): qty 50

## 2020-12-08 MED ORDER — FENTANYL CITRATE (PF) 100 MCG/2ML IJ SOLN: 25.0000 ug | INTRAMUSCULAR | Status: DC | PRN

## 2020-12-08 MED ORDER — FENTANYL BOLUS VIA INFUSION
25.0000 ug | INTRAVENOUS | Status: DC | PRN
  Administered 2020-12-08 – 2020-12-09 (×6): 25 ug via INTRAVENOUS
  Filled 2020-12-08: qty 25

## 2020-12-08 MED ORDER — POLYETHYLENE GLYCOL 3350 17 G PO PACK
17.0000 g | PACK | Freq: Every day | ORAL | Status: DC
  Administered 2020-12-08: 17 g via ORAL
  Filled 2020-12-08: qty 1

## 2020-12-08 MED ORDER — DOXAZOSIN MESYLATE 2 MG PO TABS
2.0000 mg | ORAL_TABLET | Freq: Every day | ORAL | Status: AC
  Administered 2020-12-08 – 2020-12-10 (×3): 2 mg
  Filled 2020-12-08 (×3): qty 1

## 2020-12-08 MED ORDER — MIDAZOLAM HCL 2 MG/2ML IJ SOLN
INTRAMUSCULAR | Status: DC | PRN
  Administered 2020-12-08: 1 mg via INTRAVENOUS

## 2020-12-08 SURGICAL SUPPLY — 9 items
CATH INFINITI 5FR MULTPACK ANG (CATHETERS) ×1 IMPLANT
CLOSURE MYNX CONTROL 5F (Vascular Products) ×1 IMPLANT
KIT HEART LEFT (KITS) ×2 IMPLANT
MAT PREVALON FULL STRYKER (MISCELLANEOUS) ×1 IMPLANT
PACK CARDIAC CATHETERIZATION (CUSTOM PROCEDURE TRAY) ×2 IMPLANT
SHEATH PINNACLE 5F 10CM (SHEATH) ×1 IMPLANT
SYR MEDRAD MARK 7 150ML (SYRINGE) ×1 IMPLANT
TRANSDUCER W/STOPCOCK (MISCELLANEOUS) ×2 IMPLANT
WIRE EMERALD 3MM-J .035X150CM (WIRE) ×1 IMPLANT

## 2020-12-08 NOTE — Plan of Care (Signed)
  Problem: Education: Goal: Knowledge of General Education information will improve Description: Including pain rating scale, medication(s)/side effects and non-pharmacologic comfort measures Outcome: Progressing   Problem: Health Behavior/Discharge Planning: Goal: Ability to manage health-related needs will improve Outcome: Progressing   Problem: Clinical Measurements: Goal: Ability to maintain clinical measurements within normal limits will improve Outcome: Progressing Goal: Will remain free from infection Outcome: Progressing Goal: Diagnostic test results will improve Outcome: Progressing Goal: Respiratory complications will improve Outcome: Progressing Goal: Cardiovascular complication will be avoided Outcome: Progressing   Problem: Activity: Goal: Risk for activity intolerance will decrease Outcome: Progressing   Problem: Nutrition: Goal: Adequate nutrition will be maintained Outcome: Progressing   Problem: Coping: Goal: Level of anxiety will decrease Outcome: Progressing   Problem: Elimination: Goal: Will not experience complications related to bowel motility Outcome: Progressing Goal: Will not experience complications related to urinary retention Outcome: Progressing   Problem: Pain Managment: Goal: General experience of comfort will improve Outcome: Progressing   Problem: Safety: Goal: Ability to remain free from injury will improve Outcome: Progressing   Problem: Skin Integrity: Goal: Risk for impaired skin integrity will decrease Outcome: Progressing   Problem: Education: Goal: Knowledge of disease or condition will improve Outcome: Progressing Goal: Knowledge of secondary prevention will improve Outcome: Progressing Goal: Knowledge of patient specific risk factors addressed and post discharge goals established will improve Outcome: Progressing   Problem: Coping: Goal: Will verbalize positive feelings about self Outcome: Progressing Goal: Will  identify appropriate support needs Outcome: Progressing   Problem: Health Behavior/Discharge Planning: Goal: Ability to manage health-related needs will improve Outcome: Progressing   Problem: Self-Care: Goal: Ability to participate in self-care as condition permits will improve Outcome: Progressing Goal: Verbalization of feelings and concerns over difficulty with self-care will improve Outcome: Progressing Goal: Ability to communicate needs accurately will improve Outcome: Progressing   Problem: Nutrition: Goal: Risk of aspiration will decrease Outcome: Progressing Goal: Dietary intake will improve Outcome: Progressing   Problem: Ischemic Stroke/TIA Tissue Perfusion: Goal: Complications of ischemic stroke/TIA will be minimized Outcome: Progressing   

## 2020-12-08 NOTE — Progress Notes (Signed)
PT Cancellation Note  Patient Details Name: Brandon Daniel MRN: [id number] DOB: 1951/03/14   Cancelled Treatment:    Reason Eval/Treat Not Completed: Active bedrest order  Per RN pt is still on bedrest and going for cardiac cath today. She recommended defer PT evaluation. Noted plan for ?arteriogram 6/15. Will plan to evaluate when pt off bedrest and medically appropriate.   Jerolyn Center, PT Pager [phone]   Zena Amos 12/08/2020, 11:02 AM

## 2020-12-08 NOTE — Progress Notes (Signed)
K+3.3 ?Replaced per protocol  ?

## 2020-12-08 NOTE — Progress Notes (Signed)
OT Cancellation Note  Patient Details Name: Brandon Daniel MRN: [id number] DOB: 06-Dec-1950   Cancelled Treatment:    Reason Eval/Treat Not Completed: Medical issues which prohibited therapy. Deferred OT eval this date. Per RN pt is still on bedrest. Plan for cardiac cath today and possible arteriogram 6/15. OT to sign off at this time. Will await new orders when patient is able to participate.   Kallie Edward OTR/L Supplemental OT, Department of rehab services [phone]  Rylah Fukuda R H. 12/08/2020, 11:49 AM

## 2020-12-08 NOTE — Progress Notes (Signed)
ANTICOAGULATION CONSULT NOTE - Initial Consult  Pharmacy Consult for heparin Indication:  NSTEMI in setting of CVA   Labs: Recent Labs    12/05/20 0943 12/05/20 1516 12/06/20 0509 12/06/20 0510 12/06/20 1300 12/06/20 2004 12/07/20 0353 12/07/20 0636 12/07/20 1030 12/08/20 0452  HGB  --    < > 9.7*  --  9.4*  --  8.5*  --   --  8.6*  HCT  --    < > 32.0*  --  31.4*  --  28.1*  --   --  28.6*  PLT  --    < > 170  --  159  --  153  --   --  158  HEPARINUNFRC  --    < >  --    < > 0.23* 0.22* 0.31  --  0.30 0.21*  CREATININE  --   --  1.16  --   --   --  0.94  --   --  0.77  TROPONINIHS >24,000*  --   --   --   --  10,138*  --  6,694*  --   --    < > = values in this interval not displayed.    Assessment: 70yo male subtherapeutic on heparin after two levels at goal; no gtt issues or signs of bleeding per RN.  Goal of Therapy:  Heparin level 0.3-0.5 units/ml   Plan:  Will increase heparin gtt by 1 unit/kg/hr to 2000 units/hr and check level in 6 hours.    Vernard Gambles, PharmD, BCPS  12/08/2020,6:20 AM

## 2020-12-08 NOTE — Progress Notes (Addendum)
STROKE TEAM PROGRESS NOTE    Interval History   Remains intubated and sedated. Pressures have remained stable off Levophed that was stopped on 12/06/20.  On heparin gtt for NSTEMI, heart cath to be done today   Continues to follow commands on the left upper and lower extremity.  No movement on the right.   Pertinent Lab Work and Imaging    12/04/20 CT Head WO IV Contrast 1. No acute intracranial abnormality. 2. ASPECTS is 10.  12/03/20 CT Angio Head and Neck W WO IV Contrast 1. Negative CTA for emergent large vessel occlusion. 2. Extensive bulky calcified plaque about the left carotid bulb/proximal left ICA with associated severe near occlusive stenosis. Left ICA, MCA, and ACA markedly attenuated but remain patent distally. 3. Short-segment 70% atheromatous stenosis at the proximal cervical right ICA. 4. Severe left and moderate right vertebral artery origin stenoses.  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  12/04/20 Echocardiogram Complete  1. Left ventricular ejection fraction, by estimation, is 55 to 60%. The left ventricle has normal function. The left ventricle has no regional wall motion abnormalities. Left ventricular diastolic parameters are  indeterminate. Elevated left ventricular end-diastolic pressure.   2. Right ventricular systolic function is normal. The right ventricular size is normal. There is normal pulmonary artery systolic pressure. The estimated right ventricular systolic pressure is 11.0 mmHg.   3. The mitral valve is normal in structure. No evidence of mitral valve regurgitation. No evidence of mitral stenosis.   4. The aortic valve was not well visualized. Aortic valve regurgitation is not visualized. No aortic stenosis is present.   5. The inferior vena cava is normal in size with greater than 50% respiratory variability, suggesting right atrial pressure of 3 mmHg.   12/04/20 VAS US Carotid  Right  ICA 60-79% stenosis, left ICA 80-99% stenosis   Physical Examination   Constitutional: Calm, appropriate for condition  Cardiovascular: Normal RR Respiratory: No increased WOB   Mental status: Patient is intubated and sedated. opens eyes partially to stimulation. Unable to answer LOC questions due to aphasia and sedation. Follows commands ( squeezes left hand and releases on command and wiggles left toes)  Speech: Globally aphasic.  Cranial nerves: EOMI, VFF, Right facial weakness Motor: LUE 3/5 spontaneous movement, LLE 2/5 spontaneous movement, RUE flickers, RLE 0/5 Sensory: Appears intact to light touch  Coordination: Deferred  Gait: Deferred   Assessment and Plan   [name] is a 70 y.o. male w/pmh of CAD, HTN, lumbar stenosis, HLD who initially presented with SOB, was found to be Influenza A + and later on day of admission developed mild R facial droop, R arm drift and hand grip weakness along with R leg weakness. Code stroke was initiated and received TPA.   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 CTA Head and Neck was pertinent for LICA with severe stenosis/near occlusion. Suspect L MCA stroke in the setting of atheroembolism however waiting on echo to evaluate for cardiac cause.  LDL 55 Hemoglobin A1C 6.2 DVT prophylaxis: SCDs, Heparin infusion Aspirin 81 mg and plavix 75mg  daily prior to admission, now on Heparin infusion.  Echo: EF 55-60%, LA normal in size  Therapy recommendations: pending Disposition: pending At discharge will place ambulatory referral to neurology for stroke follow up   LICA stenosis Neuro IR  consulted for LICA stenosis on 6/13, of note patient is not medically stable from flu/respiratory standpoint for intervention  NSTEMI  Cardiomyopathy history Elevated troponin 5,741 -> 24.00 Cardiology consulted for  New ST depression in V6 and worsening T wave inversion in inferior leads; likely demand ischemia in the setting of known coronary artery disease (18 stents) and worsening hypoxia and acidosis Currently on Heparin gtt for NSTEMI, Cards also recommending Plavix in addition to Heparin. He is going for left heart cath today on 6/14 will talk with cards about if there is a need for heparin gtt post cardiac cath   Essential Hypertension (History) Home medication: HCTZ 25 QD, Atenolol 100 mg QD, Lisinopril 20 mg QD at home.  SBP goal 90-140/50-85 Continue Metoprolol 5mg  q 6hr  Hypotension SBP goal 90-140/50-85 CCM on board to address/manage hypotension with pressors/boluses   Hyperlipidemia LDL goal is < 70. LDL is 55 and is at goal.  Atorvastatin 80 mg and Zetia 10mg  daily  Acute Respiratory Failure  Prior to admission he had SOB and cough. Noted to be hypoxic in the ED. Was found to be Influenza A positive Intubated on 6/11 for increased WOB and hypoxia CXR bilateral infiltrates and pulmonary edema Albuterol nebs prn, Brovana bid, Duonebs CCM on board  Acute Kidney Injury Creatinine 1.36->1.16 NS infusion Follow creatinine and uop closely CCM on board  Urinary retension Straight cath overnight and this am Required foley placement  Diabetes Hemoglobin A1C 6.2, goal <7 SSI CBGs  Dysphagia  Likely due to stroke SLP assessed him on 6/10, noted dysphagia and recommended NPO.  Has OG tube, begin enteral nutrition as tolerated pending procedures   Influenza A Screened for influenza given recent travel to and ongoing SOB prior to admission. Noted to be febrile and hypoxic this admission. Tamiflu 75 BID for 5 days- 12/03/20 to 12/07/20.   Fever  Trending CBC, so far no leukocytosis. Last fever today on 6/14 at 0344.  CXR: Persistent interstitial and airspace opacities Increased sputum- culture w/moderate WBC few gram positive and negative rods  CXR and UA ordered 6/14  as a part of fever work up. CXR with slight increase in the degree of left basilar consolidation with associated effusion. UA pending.  Ceftriaxone 2g q24hours Azithromycin 500mg  q24hrs   Hospital day # 5  7/14, AGPCNP-BC  Stroke NP  ATTENDING NOTE: I reviewed above note and agree with the assessment and plan. Pt was seen and examined.   No acute event overnight, patient still have fever this morning 101.9.  Intermittently follow commands on the right side.  Had a cardiac cath today with Dr. 7/14, heparin IV discontinued post procedure and change to DAPT at this time.  Discussed with Dr. neuro IR, agree with DAPT, will decide on carotid stenting with reevaluation after patient extubated.  Discussed with Dr. Stark Jock CCM, respiratory wise patient doing well, may consider extubation tomorrow based on improvement.  Last day for antibiotics, will monitor fever curve, still considering due to flu.  Continue Lipitor and Zetia.  I had long discussion with nephew at bedside, updated pt current condition, treatment plan and potential prognosis, and answered all the questions.  He expressed understanding and appreciation.   For detailed assessment and plan, please refer to above as I have made changes wherever appropriate.   This patient is critically ill due to left MCA and ACA infarct, non-STEMI, flu with fever, respiratory failure intubated and at significant risk of neurological worsening, death form heart  failure, septic shock, seizure, recurrent stroke. This patient's care requires constant monitoring of vital signs, hemodynamics, respiratory and cardiac monitoring, review of multiple databases, neurological assessment, discussion with family, other specialists and medical decision making of high complexity. I spent 40 minutes of neurocritical care time in the care of this patient.   Marvel Plan, MD PhD Stroke Neurology 12/08/2020 8:02 AM     To contact Stroke  Continuity provider, please refer to [email or web address]. After hours, contact General Neurology

## 2020-12-08 NOTE — Progress Notes (Addendum)
NAME:  Brandon Daniel, MRN:  [id number], DOB:  May 01, 1951, LOS: 5 ADMISSION DATE:  12/03/2020, CONSULTATION DATE:  12/04/20 REFERRING MD:  Derry Lory, CHIEF COMPLAINT:  weakness   History of Present Illness:  70 year old man with hx of CAD, HTN, HLD admitted for SOB from influenza A on 12/03/20.  Developed R face and R sided weakness this evening and received TPA after normal head CT.  After this he developed some worsening responsiveness so another CT ordered which looks fine. PCCM consulted to monitor airway.  Pertinent  Medical History  Asthma/COPD overlad CAD HLD HTN  Significant Hospital Events: Including procedures, antibiotic start and stop dates in addition to other pertinent events   6/9 admitted 6/9 evening code stroke, tpa, PCCM eval 6/10 Intubated for resp distress 6/11 Lt IJ CVL placed, levo for hypotension, started heparin drip for NSTEMI and cardiology consulted  Interim History / Subjective:   Off pressors. Awake, following commands, remains on the ventilator. Right hemiparesis present. Vent Setting FiO2 of 30 and Peep of 8. Will Decrease PEEP and wean patient when he returns from the Cath lab.   Objective   Blood pressure 125/71, pulse 75, temperature 99.7 F (37.6 C), temperature source Axillary, resp. rate (!) 26, height 5' 9.02" (1.753 m), weight 100.8 kg, SpO2 97 %. CVP:  [2 mmHg-17 mmHg] 7 mmHg  Vent Mode: PRVC FiO2 (%):  [30 %] 30 % Set Rate:  [26 bmp] 26 bmp Vt Set:  [490 mL] 490 mL PEEP:  [8 cmH20] 8 cmH20 Plateau Pressure:  [16 cmH20-24 cmH20] 21 cmH20   Intake/Output Summary (Last 24 hours) at 12/08/2020 0901 Last data filed at 12/08/2020 0600 Gross per 24 hour  Intake 2376.62 ml  Output 2173 ml  Net 203.62 ml    Filed Weights   12/03/20 0323 12/03/20 1400 12/04/20 0500  Weight: 102.1 kg 99.2 kg 100.8 kg    Examination: Gen:      No acute distress on vent HEENT:  EOMI, sclera anicteric, ETT Neck:     No masses; no thyromegaly Lungs:    Bilateral rhonchi, expiratory wheeze  CV:         Regular rate and rhythm; no murmurs Abd:      + bowel sounds; soft, non-tender; no palpable masses, no distension Ext:    No edema; adequate peripheral perfusion Skin:      Warm and dry; no rash Neuro: Right hemiparesis, follows commands.  Appropriate accommodation in both eyes. R eye with right deviation.  Labs/imaging that I havepersonally reviewed  (right click and "Reselect all SmartList Selections" daily)  Na 143 Potassium 3.3 creatinine 0.77 hemoglobin 8.6  Resolved Hospital Problem list   N/a  Assessment & Plan:  Acute respiratory failure due to inability to clear secretions, acute pulmonary edema, Influenza A pna Follow chest x-ray Holding on Lasix until he is off pressors Continue ceftriaxone   Azithromycin Complete  Tracheal aspirate culture pending Continue Tamiflu for influenza pneumonia  Left ACA, MCA stroke s/p tPA Management per neurology Will need carotid endarterectomy for stenosis when stable Neuro IR, possible diagnostic cerebral arteriogram if medically stable on 6/15  Non ST elevation MI, history of cardiomyopathy, acute pulmonary edema Marked elevation in troponins noted.  He has extensive cardiac history Cardiology following  Off Pressor Continue heparin drip, Resume Metoprolol 5mg  Q6.  Continue Lipitor, Zetia Tentative plans for left heart catheterization on 6/14  Low hemoglobin.  No evidence of active bleed Likely secondary to critical illness Follow CBC  Stable 6/14 Hgb 8.6;no transfusion needed at this time.  Consider transfusion if <7 or patient presentation worsens  AKI, improved  6/14 Cr 0.77 Supportive care, monitor urine output and creatinine Electrolyte replacement per protocol   Left pleural effusion Too small to do thoracentesis.  Monitor  Prediabetes  HbA1C 6.2 SSI   Nutrition Tube feeds  Best practice (right click and "Reselect all SmartList Selections" daily)  Diet:   Tube Feed  Pain/Anxiety/Delirium protocol (if indicated): Yes (RASS goal -1) VAP protocol (if indicated): Yes DVT prophylaxis: Systemic AC GI prophylaxis: PPI Glucose control:  SSI No Central venous access:  Yes, and it is still needed Arterial line:  N/A Foley:  N/A Mobility:  bed rest  PT consulted: N/A Last date of multidisciplinary goals of care discussion [6/11] Code Status:  full code Disposition: ICU  Critical care time: The patient is critically ill with multiple organ systems failure and requires high complexity decision making for assessment and support, frequent evaluation and titration of therapies, application of advanced monitoring technologies and extensive interpretation of multiple databases.  Critical care time 35 mins. This represents my time independent of the NPs time taking care of the pt. This is excluding procedures.    Briant Sites DO [facility] 12/08/2020, 12:36 PM See Amion for pager If no response to pager, please call [phone] until 1900 After 1900 please call [facility] [phone]

## 2020-12-08 NOTE — H&P (View-Only) (Signed)
Ref: Pcp, No   Subjective:  Awakens easily. Remians intubated with 30 $ FiO2. Hgb down to 8.5 without known source of bleeding.  Objective:  Vital Signs in the last 24 hours: Temp:  [98.2 F (36.8 C)-101.9 F (38.8 C)] 99.7 F (37.6 C) (06/14 0741) Pulse Rate:  [61-109] 68 (06/14 0630) Cardiac Rhythm: Normal sinus rhythm (06/14 0000) Resp:  [17-26] 26 (06/14 0630) BP: (117-165)/(54-88) 148/79 (06/14 0630) SpO2:  [89 %-100 %] 95 % (06/14 0630) FiO2 (%):  [30 %] 30 % (06/14 0806)  Physical Exam: BP Readings from Last 1 Encounters:  12/08/20 (!) 148/79     Wt Readings from Last 1 Encounters:  12/04/20 100.8 kg    Weight change:  Body mass index is 32.8 kg/m. HEENT: [place]/AT, Eyes-Blue, Conjunctiva-Pale, Sclera-Non-icteric Neck: No JVD, No bruit, Trachea midline. Lungs:  Clear,ing Bilateral. Cardiac:  Regular rhythm, normal S1 and S2, no S3. II/VI systolic murmur. Abdomen:  Soft, non-tender. BS present. Extremities:  No edema present. No cyanosis. No clubbing. CNS: AxOx1, Cranial nerves grossly intact, moves aleft sided extremities.  Skin: Warm and dry.   Intake/Output from previous day: 06/13 0701 - 06/14 0700 In: 2489.5 [I.V.:798.9; NG/GT:804.2; IV Piggyback:886.4] Out: 2173 [Urine:2173]    Lab Results: BMET    Component Value Date/Time   NA 143 12/08/2020 0452   NA 144 12/07/2020 0353   NA 140 12/06/2020 0509   K 3.3 (L) 12/08/2020 0452   K 3.7 12/07/2020 0353   K 3.4 (L) 12/06/2020 0509   CL 111 12/08/2020 0452   CL 109 12/07/2020 0353   CL 108 12/06/2020 0509   CO2 27 12/08/2020 0452   CO2 26 12/07/2020 0353   CO2 25 12/06/2020 0509   GLUCOSE 134 (H) 12/08/2020 0452   GLUCOSE 146 (H) 12/07/2020 0353   GLUCOSE 188 (H) 12/06/2020 0509   BUN 26 (H) 12/08/2020 0452   BUN 28 (H) 12/07/2020 0353   BUN 26 (H) 12/06/2020 0509   CREATININE 0.77 12/08/2020 0452   CREATININE 0.94 12/07/2020 0353   CREATININE 1.16 12/06/2020 0509   CALCIUM 7.7 (L) 12/08/2020  0452   CALCIUM 7.5 (L) 12/07/2020 0353   CALCIUM 7.3 (L) 12/06/2020 0509   GFRNONAA >60 12/08/2020 0452   GFRNONAA >60 12/07/2020 0353   GFRNONAA >60 12/06/2020 0509   CBC    Component Value Date/Time   WBC 4.8 12/08/2020 0452   RBC 3.04 (L) 12/08/2020 0452   HGB 8.6 (L) 12/08/2020 0452   HCT 28.6 (L) 12/08/2020 0452   PLT 158 12/08/2020 0452   MCV 94.1 12/08/2020 0452   MCH 28.3 12/08/2020 0452   MCHC 30.1 12/08/2020 0452   RDW 14.6 12/08/2020 0452   LYMPHSABS 0.5 (L) 12/06/2020 1300   MONOABS 0.6 12/06/2020 1300   EOSABS 0.0 12/06/2020 1300   BASOSABS 0.0 12/06/2020 1300   HEPATIC Function Panel No results for input(s): PROT in the last 8760 hours.  Invalid input(s):  ALBUMIN,  AST,  ALT,  ALKPHOS,  BILIDIR,  IBILI HEMOGLOBIN A1C No components found for: HGA1C,  MPG CARDIAC ENZYMES No results found for: CKTOTAL, CKMB, CKMBINDEX, TROPONINI BNP No results for input(s): PROBNP in the last 8760 hours. TSH No results for input(s): TSH in the last 8760 hours. CHOLESTEROL Recent Labs    12/04/20 0219  CHOL 108    Scheduled Meds:  arformoterol  15 mcg Nebulization BID   atorvastatin  80 mg Per Tube Daily   chlorhexidine gluconate (MEDLINE KIT)  15 mL Mouth  Rinse BID   Chlorhexidine Gluconate Cloth  6 each Topical Daily   ezetimibe  10 mg Per Tube Daily   feeding supplement (PROSource TF)  45 mL Per Tube TID   gabapentin  600 mg Per Tube BID   insulin aspart  0-15 Units Subcutaneous Q4H   ipratropium-albuterol  3 mL Nebulization BID   mouth rinse  15 mL Mouth Rinse 10 times per day   metoprolol tartrate  5 mg Intravenous Q6H   montelukast  10 mg Per Tube QHS   oseltamivir  75 mg Per Tube BID   pantoprazole (PROTONIX) IV  40 mg Intravenous QHS   potassium chloride  20 mEq Per Tube Q4H   sodium chloride flush  10-40 mL Intracatheter Q12H   Continuous Infusions:  sodium chloride 10 mL/hr at 12/08/20 0600   azithromycin Stopped (12/08/20 0249)   cefTRIAXone  (ROCEPHIN)  IV Stopped (12/08/20 0010)   feeding supplement (VITAL 1.5 CAL) Stopped (12/08/20 0015)   fentaNYL infusion INTRAVENOUS 125 mcg/hr (12/08/20 0600)   heparin 2,000 Units/hr (12/08/20 0807)   potassium chloride 10 mEq (12/08/20 0618)   PRN Meds:.acetaminophen, albuterol, guaiFENesin-dextromethorphan, midazolam, nitroGLYCERIN, ondansetron (ZOFRAN) IV, senna-docusate, sodium chloride flush  Assessment/Plan:  NSTEMI Acute respiratory failure with hypoxia Left ACA, MCA stroke AKI, Improved Left pleural effusion Influenza A pneumonia Anemia of blood loss  Plan:  Postpone cardiac cath. Monitor Hgb.    LOS: 5 days   Time spent including chart review, lab review, examination, discussion with patient/Family/Nurse : 30 min   Dixie Dials  MD  12/08/2020, 8:13 AM

## 2020-12-08 NOTE — Consult Note (Signed)
Ref: Pcp, No   Subjective:  Awakens easily. Remians intubated with 30 $ FiO2. Hgb down to 8.5 without known source of bleeding.  Objective:  Vital Signs in the last 24 hours: Temp:  [98.2 F (36.8 C)-101.9 F (38.8 C)] 99.7 F (37.6 C) (06/14 0741) Pulse Rate:  [61-109] 68 (06/14 0630) Cardiac Rhythm: Normal sinus rhythm (06/14 0000) Resp:  [17-26] 26 (06/14 0630) BP: (117-165)/(54-88) 148/79 (06/14 0630) SpO2:  [89 %-100 %] 95 % (06/14 0630) FiO2 (%):  [30 %] 30 % (06/14 0806)  Physical Exam: BP Readings from Last 1 Encounters:  12/08/20 (!) 148/79     Wt Readings from Last 1 Encounters:  12/04/20 100.8 kg    Weight change:  Body mass index is 32.8 kg/m. HEENT: [place]/AT, Eyes-Blue, Conjunctiva-Pale, Sclera-Non-icteric Neck: No JVD, No bruit, Trachea midline. Lungs:  Clear,ing Bilateral. Cardiac:  Regular rhythm, normal S1 and S2, no S3. II/VI systolic murmur. Abdomen:  Soft, non-tender. BS present. Extremities:  No edema present. No cyanosis. No clubbing. CNS: AxOx1, Cranial nerves grossly intact, moves aleft sided extremities.  Skin: Warm and dry.   Intake/Output from previous day: 06/13 0701 - 06/14 0700 In: 2489.5 [I.V.:798.9; NG/GT:804.2; IV Piggyback:886.4] Out: 2173 [Urine:2173]    Lab Results: BMET    Component Value Date/Time   NA 143 12/08/2020 0452   NA 144 12/07/2020 0353   NA 140 12/06/2020 0509   K 3.3 (L) 12/08/2020 0452   K 3.7 12/07/2020 0353   K 3.4 (L) 12/06/2020 0509   CL 111 12/08/2020 0452   CL 109 12/07/2020 0353   CL 108 12/06/2020 0509   CO2 27 12/08/2020 0452   CO2 26 12/07/2020 0353   CO2 25 12/06/2020 0509   GLUCOSE 134 (H) 12/08/2020 0452   GLUCOSE 146 (H) 12/07/2020 0353   GLUCOSE 188 (H) 12/06/2020 0509   BUN 26 (H) 12/08/2020 0452   BUN 28 (H) 12/07/2020 0353   BUN 26 (H) 12/06/2020 0509   CREATININE 0.77 12/08/2020 0452   CREATININE 0.94 12/07/2020 0353   CREATININE 1.16 12/06/2020 0509   CALCIUM 7.7 (L) 12/08/2020  0452   CALCIUM 7.5 (L) 12/07/2020 0353   CALCIUM 7.3 (L) 12/06/2020 0509   GFRNONAA >60 12/08/2020 0452   GFRNONAA >60 12/07/2020 0353   GFRNONAA >60 12/06/2020 0509   CBC    Component Value Date/Time   WBC 4.8 12/08/2020 0452   RBC 3.04 (L) 12/08/2020 0452   HGB 8.6 (L) 12/08/2020 0452   HCT 28.6 (L) 12/08/2020 0452   PLT 158 12/08/2020 0452   MCV 94.1 12/08/2020 0452   MCH 28.3 12/08/2020 0452   MCHC 30.1 12/08/2020 0452   RDW 14.6 12/08/2020 0452   LYMPHSABS 0.5 (L) 12/06/2020 1300   MONOABS 0.6 12/06/2020 1300   EOSABS 0.0 12/06/2020 1300   BASOSABS 0.0 12/06/2020 1300   HEPATIC Function Panel No results for input(s): PROT in the last 8760 hours.  Invalid input(s):  ALBUMIN,  AST,  ALT,  ALKPHOS,  BILIDIR,  IBILI HEMOGLOBIN A1C No components found for: HGA1C,  MPG CARDIAC ENZYMES No results found for: CKTOTAL, CKMB, CKMBINDEX, TROPONINI BNP No results for input(s): PROBNP in the last 8760 hours. TSH No results for input(s): TSH in the last 8760 hours. CHOLESTEROL Recent Labs    12/04/20 0219  CHOL 108    Scheduled Meds:  arformoterol  15 mcg Nebulization BID   atorvastatin  80 mg Per Tube Daily   chlorhexidine gluconate (MEDLINE KIT)  15 mL Mouth  Rinse BID   Chlorhexidine Gluconate Cloth  6 each Topical Daily   ezetimibe  10 mg Per Tube Daily   feeding supplement (PROSource TF)  45 mL Per Tube TID   gabapentin  600 mg Per Tube BID   insulin aspart  0-15 Units Subcutaneous Q4H   ipratropium-albuterol  3 mL Nebulization BID   mouth rinse  15 mL Mouth Rinse 10 times per day   metoprolol tartrate  5 mg Intravenous Q6H   montelukast  10 mg Per Tube QHS   oseltamivir  75 mg Per Tube BID   pantoprazole (PROTONIX) IV  40 mg Intravenous QHS   potassium chloride  20 mEq Per Tube Q4H   sodium chloride flush  10-40 mL Intracatheter Q12H   Continuous Infusions:  sodium chloride 10 mL/hr at 12/08/20 0600   azithromycin Stopped (12/08/20 0249)   cefTRIAXone  (ROCEPHIN)  IV Stopped (12/08/20 0010)   feeding supplement (VITAL 1.5 CAL) Stopped (12/08/20 0015)   fentaNYL infusion INTRAVENOUS 125 mcg/hr (12/08/20 0600)   heparin 2,000 Units/hr (12/08/20 0807)   potassium chloride 10 mEq (12/08/20 0618)   PRN Meds:.acetaminophen, albuterol, guaiFENesin-dextromethorphan, midazolam, nitroGLYCERIN, ondansetron (ZOFRAN) IV, senna-docusate, sodium chloride flush  Assessment/Plan:  NSTEMI Acute respiratory failure with hypoxia Left ACA, MCA stroke AKI, Improved Left pleural effusion Influenza A pneumonia Anemia of blood loss  Plan:  Postpone cardiac cath. Monitor Hgb.    LOS: 5 days   Time spent including chart review, lab review, examination, discussion with patient/Family/Nurse : 30 min   Dixie Dials  MD  12/08/2020, 8:13 AM

## 2020-12-08 NOTE — Interval H&P Note (Signed)
History and Physical Interval Note:  12/08/2020 1:08 PM  Brandon Daniel  has presented today for surgery, with the diagnosis of nstemi.  The various methods of treatment have been discussed with the patient and family. After consideration of risks, benefits and other options for treatment, the patient has consented to  Procedure(s): LEFT HEART CATH AND CORS/GRAFTS ANGIOGRAPHY (N/A) as a surgical intervention.  The patient's history has been reviewed, patient examined, no change in status, stable for surgery.  I have reviewed the patient's chart and labs.  Questions were answered to the patient's satisfaction.     Ricki Rodriguez

## 2020-12-08 NOTE — Progress Notes (Signed)
Patient was transported to [facility] lab & back to 3M09 without any complications.

## 2020-12-08 NOTE — Progress Notes (Signed)
NIR consulted by Dr. Roda Shutters for possible image-guided diagnostic cerebral arteriogram.  Chart check reveals patient for possible cardiac cath with intervention tentatively for today. Discussed case with Dr. Tommie Sams- NIR to follow-up with patient tomorrow regarding possible diagnostic cerebral arteriogram if medically stable.  Further plans per neurology/CCM/cardiology- appreciate and agree with management. NIR to follow.   Brandon Boga Drinda Belgard, PA-C 12/08/2020, 9:47 AM

## 2020-12-09 ENCOUNTER — Encounter (HOSPITAL_COMMUNITY): Payer: Self-pay | Admitting: Cardiovascular Disease

## 2020-12-09 DIAGNOSIS — J101 Influenza due to other identified influenza virus with other respiratory manifestations: Secondary | ICD-10-CM | POA: Diagnosis not present

## 2020-12-09 DIAGNOSIS — J9601 Acute respiratory failure with hypoxia: Secondary | ICD-10-CM | POA: Diagnosis not present

## 2020-12-09 LAB — BASIC METABOLIC PANEL
Anion gap: 8 (ref 5–15)
BUN: 24 mg/dL — ABNORMAL HIGH (ref 8–23)
CO2: 28 mmol/L (ref 22–32)
Calcium: 7.8 mg/dL — ABNORMAL LOW (ref 8.9–10.3)
Chloride: 106 mmol/L (ref 98–111)
Creatinine, Ser: 0.66 mg/dL (ref 0.61–1.24)
GFR, Estimated: >60 mL/min (ref >60)
Glucose, Bld: 170 mg/dL — ABNORMAL HIGH (ref 70–99)
Potassium: 3.9 mmol/L (ref 3.5–5.1)
Sodium: 142 mmol/L (ref 135–145)

## 2020-12-09 LAB — CBC
HCT: 29.4 % — ABNORMAL LOW (ref 39.0–52.0)
Hemoglobin: 8.8 g/dL — ABNORMAL LOW (ref 13.0–17.0)
MCH: 28.2 pg (ref 26.0–34.0)
MCHC: 29.9 g/dL — ABNORMAL LOW (ref 30.0–36.0)
MCV: 94.2 fL (ref 80.0–100.0)
Platelets: 163 10*3/uL (ref 150–400)
RBC: 3.12 MIL/uL — ABNORMAL LOW (ref 4.22–5.81)
RDW: 14.7 % (ref 11.5–15.5)
WBC: 4.3 10*3/uL (ref 4.0–10.5)
nRBC: 0 % (ref 0.0–0.2)

## 2020-12-09 LAB — PHOSPHORUS: Phosphorus: 3.1 mg/dL (ref 2.5–4.6)

## 2020-12-09 LAB — GLUCOSE, CAPILLARY
Glucose-Capillary: 157 mg/dL — ABNORMAL HIGH (ref 70–99)
Glucose-Capillary: 133 mg/dL — ABNORMAL HIGH (ref 70–99)
Glucose-Capillary: 133 mg/dL — ABNORMAL HIGH (ref 70–99)
Glucose-Capillary: 111 mg/dL — ABNORMAL HIGH (ref 70–99)
Glucose-Capillary: 187 mg/dL — ABNORMAL HIGH (ref 70–99)
Glucose-Capillary: 211 mg/dL — ABNORMAL HIGH (ref 70–99)

## 2020-12-09 LAB — MAGNESIUM: Magnesium: 2.4 mg/dL (ref 1.7–2.4)

## 2020-12-09 MED ORDER — ENOXAPARIN SODIUM 60 MG/0.6ML IJ SOSY
50.0000 mg | PREFILLED_SYRINGE | INTRAMUSCULAR | Status: DC
  Administered 2020-12-09 – 2020-12-18 (×10): 50 mg via SUBCUTANEOUS
  Filled 2020-12-09 (×10): qty 0.6

## 2020-12-09 MED ORDER — FUROSEMIDE 10 MG/ML IJ SOLN
40.0000 mg | Freq: Once | INTRAMUSCULAR | Status: AC
  Administered 2020-12-09: 40 mg via INTRAVENOUS
  Filled 2020-12-09: qty 4

## 2020-12-09 NOTE — Progress Notes (Addendum)
STROKE TEAM PROGRESS NOTE    Interval History   Remains intubated and sedated. Desaturated yesterday evening due to being flat for cardiac cath.   Neurological exam is stable; continues to follow commands with the LUE.   Touched base with Neuro IR this morning-they prefer for patient to be extubated and a neuro baseline established prior to intervention.   Pertinent Lab Work and Imaging    12/04/20 CT Head WO IV Contrast 1. No acute intracranial abnormality. 2. ASPECTS is 10.  12/03/20 CT Angio Head and Neck W WO IV Contrast 1. Negative CTA for emergent large vessel occlusion. 2. Extensive bulky calcified plaque about the left carotid bulb/proximal left ICA with associated severe near occlusive stenosis. Left ICA, MCA, and ACA markedly attenuated but remain patent distally. 3. Short-segment 70% atheromatous stenosis at the proximal cervical right ICA. 4. Severe left and moderate right vertebral artery origin stenoses.  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  12/04/20 Echocardiogram Complete  1. Left ventricular ejection fraction, by estimation, is 55 to 60%. The left ventricle has normal function. The left ventricle has no regional wall motion abnormalities. Left ventricular diastolic parameters are  indeterminate. Elevated left ventricular end-diastolic pressure.   2. Right ventricular systolic function is normal. The right ventricular size is normal. There is normal pulmonary artery systolic pressure. The estimated right ventricular systolic pressure is 11.0 mmHg.   3. The mitral valve is normal in structure. No evidence of mitral valve regurgitation. No evidence of mitral stenosis.   4. The aortic valve was not well visualized. Aortic valve regurgitation is not visualized. No aortic stenosis is present.   5. The inferior vena cava is normal in size with greater than 50% respiratory variability, suggesting right atrial  pressure of 3 mmHg.   12/04/20 VAS US Carotid  Right ICA 60-79% stenosis, left ICA 80-99% stenosis   Physical Examination   Constitutional: Calm, appropriate for condition  Cardiovascular: Normal RR Respiratory: No increased WOB   Mental status: Patient is intubated and sedated. Opens eyes to voice. Unable to answer LOC questions due to aphasia and sedation. Follows commands (squeezes left hand and releases on command and wiggles left toes. Has difficulty showing two fingers and giving thumbs up.)  Speech: Globally aphasic Cranial nerves: L Gaze preference, Visual fields difficult to assess but seem full with BTT, Right facial weakness Motor: LUE 3/5 spontaneous movement, LLE 2/5 spontaneous movement, RUE 0/5, RLE 0/5 Sensory: Decreased sensation to the right, does not respond to noxious stimulation  Coordination: UTA given sedation  Gait: UTA given patient acuity   Assessment and Plan   Brandon Daniel is a 70 y.o. male w/pmh of CAD, HTN, lumbar stenosis, HLD who initially presented with SOB, was found to be Influenza A + and later on day of admission developed mild R facial droop, R arm drift and hand grip weakness along with R leg weakness. Code stroke was initiated and received TPA.   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 RICA was also notably stenosed. CUS revealed RICA w/60-79 % stenosis, LICA with 80-99 % stenosis  Echo: EF 55-60%, LA normal in size  Stroke labs: LDL 55, Hemoglobin A1C 6.2 Stroke etiology is atherosclerosis  DAPT for secondary stroke prevention  with Aspirin 81 mg + Plavix 75 mg QD  Therapy recommendations: TBD, no recs as of yet due to patient acuity  Disposition: TBD  At discharge will place ambulatory referral to neurology for stroke  follow up   LICA stenosis Neuro IR consulted for LICA stenosis on 6/13 would like to establish neuro baseline post extubation prior to intervention   CARDS  NSTEMI  Cardiomyopathy history Elevated troponin 5,741 -> 24.00 Cardiology consulted for New ST depression in V6 and worsening T wave inversion in inferior leads; likely demand ischemia in the setting of known coronary artery disease (18 stents) and worsening hypoxia and acidosis. He underwent a cardiac cath on 12/08/20 that showed chronic total or near total occlusions; recommending medical treatment for now   Essential Hypertension (History) Home medication: HCTZ 25 QD, Atenolol 100 mg QD, Lisinopril 20 mg QD at home.  SBP goal 90-140/50-85, currently pressures are trending 140-160 Continue Metoprolol 5mg  q 6hr  Hyperlipidemia LDL goal is < 70. LDL is 55 and is at goal.  Atorvastatin 80 mg and Zetia 10mg  daily  RESPIRATORY  Acute Respiratory Failure  Prior to admission he had SOB and cough. Noted to be hypoxic in the ED. Was found to be Influenza A positive Intubated on 6/11 for increased WOB and hypoxia CXR bilateral infiltrates and pulmonary edema Albuterol nebs prn, Brovana bid, Duonebs CCM on board  RENAL  Acute Kidney Injury ( Resolved)  Creatinine previously elevated at 1.26, AKI now resolved  NS infusion CCM on board  ENDO  Diabetes Hemoglobin A1C 6.2, goal <7 SSI CBGs  GI/GU  Dysphagia  Dysphagia in the setting of L MCA stroke  SLP assessed him on 6/10, noted dysphagia and recommended NPO.  Has OG tube, begin enteral nutrition as tolerated pending procedures CCM on board   Urinary retention Foley placement Continue Cardura 2 mg QD  Voiding trial post extubation   HEME  DVT Prophylaxis  SCDs, Heparin infusion  ID  Influenza A Pneumonia  Screened for influenza given recent travel to and ongoing SOB prior to admission. Noted to be febrile and hypoxic this admission. Tamiflu 75 BID for  5 days- 12/03/20 to 12/07/20 Treated with Azithromycin 6/11 to 6/14  Ceftriaxone 2 g Q24 hr started 6/11-present   Fever  Trending CBC, so far no leukocytosis. Last fever was on 6/14 Increased sputum- culture w/moderate WBC few gram positive and negative rods  CXR and UA ordered 6/14 as a part of fever work up. CXR with slight increase in the degree of left basilar consolidation with associated effusion. UA negative for UTI.    Hospital day # 6  7/14, AGPCNP-BC  Stroke NP  ATTENDING NOTE: I reviewed above note and agree with the assessment and plan. Pt was seen and examined.   Nephew at bedside, patient still intubated at this time, however off sedation, plan to extubate later this afternoon.  Patient more awake alert eyes open, still has right hemiplegia.  Did not follow commands on left side today but spontaneous movement on the left upper and lower extremity, able to mimic though.  No fever overnight, no leukocytosis, UA negative, creatinine 0.66, CXR slight increase left basilar consolidation with associated effusion.  Discussed with Dr. 7/14 neuro IR, will reevaluate patient after extubation to establish neuro baseline and then decide on timing of carotid stenting.  Cardiac cath yesterday showed multivessel CAD, Dr. Stark Jock discussed with Dr. Sherlon Handing, will hold off RCA angioplasty for now and allow patient rehab  first and then possible RCA angioplasty in the future.  I had long discussion with nephew at bedside, updated pt current condition, treatment plan and potential prognosis, and answered all the questions.  He expressed understanding and appreciation.  I also discussed with Dr. Gaynell Face CCM.  This patient is critically ill due to left MCA stroke, left ICA high-grade stenosis, non-STEMI, multivessel CAD, respiratory failure needing intubation, fever and flu and at significant risk of neurological worsening, death form recurrent stroke, cardiac arrest, heart failure, sepsis.  This patient's care requires constant monitoring of vital signs, hemodynamics, respiratory and cardiac monitoring, review of multiple databases, neurological assessment, discussion with family, other specialists and medical decision making of high complexity. I spent 40 minutes of neurocritical care time in the care of this patient.   Marvel Plan, MD PhD Stroke Neurology 12/09/2020 11:24 AM     To contact Stroke Continuity provider, please refer to [email or web address]. After hours, contact General Neurology

## 2020-12-09 NOTE — Progress Notes (Addendum)
NAME:  Brandon Daniel, MRN:  [id number], DOB:  04-03-1951, LOS: 6 ADMISSION DATE:  12/03/2020, CONSULTATION DATE:  12/04/20 REFERRING MD:  Derry Lory, CHIEF COMPLAINT:  weakness   History of Present Illness:  70 year old man with hx of CAD, HTN, HLD admitted for SOB from influenza A on 12/03/20.  Developed R face and R sided weakness this evening and received TPA after normal head CT.  After this he developed some worsening responsiveness so another CT ordered which looks fine. PCCM consulted to monitor airway.  Pertinent  Medical History  Asthma/COPD overlad CAD HLD HTN  Significant Hospital Events: Including procedures, antibiotic start and stop dates in addition to other pertinent events   6/9 admitted 6/9 evening code stroke, tpa, PCCM eval 6/10 Intubated for resp distress 6/11 Lt IJ CVL placed, levo for hypotension, started heparin drip for NSTEMI and cardiology consulted 6/14 Left Heart Cath   Interim History / Subjective:  6/15 Awake, following commands, remains on the ventilator. Right hemiparesis present.  Ventilator need increased from yesterday up to 60% 8 peep with increased secretions after laying flat for prolonged period 2/2 LHC Tentative diagnostic cerebral arteriogram IR.  If patient does not go to procedure we will attempt to wean patient for SBT.   Objective   Blood pressure (!) 147/61, pulse 77, temperature 98.3 F (36.8 C), temperature source Axillary, resp. rate (!) 23, height 5' 9.02" (1.753 m), weight 100.8 kg, SpO2 92 %. CVP:  [7 mmHg-10 mmHg] 7 mmHg  Vent Mode: PRVC FiO2 (%):  [30 %-50 %] 40 % Set Rate:  [18 bmp-26 bmp] 18 bmp Vt Set:  [490 mL] 490 mL PEEP:  [8 cmH20] 8 cmH20 Plateau Pressure:  [20 cmH20-22 cmH20] 22 cmH20   Intake/Output Summary (Last 24 hours) at 12/09/2020 0908 Last data filed at 12/09/2020 0900 Gross per 24 hour  Intake 1607.66 ml  Output 2405 ml  Net -797.34 ml    Filed Weights   12/03/20 0323 12/03/20 1400 12/04/20 0500   Weight: 102.1 kg 99.2 kg 100.8 kg    Examination: Gen:      No acute distress on vent HEENT: ncat,sclera anicteric, ETT Neck:     No masses; no thyromegaly Lungs:   Bilateral rhonchi CV:         Regular rate and rhythm; no murmurs Abd:      + bowel sounds; soft, non-tender; no palpable masses, no distension Ext:    Mild le edema; adequate peripheral perfusion Skin:      Warm and dry; no rash Neuro: Right hemiparesis, follows commands.  Appropriate accommodation in both eyes. R eye with right deviation.  Labs/imaging that I havepersonally reviewed  (right click and "Reselect all SmartList Selections" daily)  Na 142 Potassium 3.9 creatinine 0.66 WBC 4.3 hemoglobin 8.8  Resolved Hospital Problem list   N/a  Assessment & Plan:  Acute respiratory failure due to inability to clear secretions, acute pulmonary edema, Influenza A pna Follow chest x-ray Give dose lasix, still 3.5L positive over hospital stay and unable to wean vent continuously  Continue ceftriaxone   Azithromycin Complete 6/14 Tracheal aspirate culture: normal flora Continue Tamiflu for influenza pneumonia  Left ACA, MCA stroke s/p tPA Management per neurology Will need carotid endarterectomy for stenosis when stable Neuro IR, possible diagnostic cerebral arteriogram once extubated  Non ST elevation MI, history of cardiomyopathy, acute pulmonary edema Marked elevation in troponins noted.  He has extensive cardiac history Cardiology following  Heparin was stopped 6/14 after LHC  and placed on asa and plavix -add subcut lovenox Continue  Metoprolol  Continue Lipitor, Zetia -LHC revealed restenosed grafts and multivessel disease   Low hemoglobin.  No evidence of active bleed Likely secondary to critical illness Follow CBC Stable 6/14 Hgb 8.8; no transfusion needed at this time.  Consider transfusion if <7 or patient presentation worsens  AKI, improved  6/15 Cr 0.66 Supportive care, monitor urine output  and creatinine Electrolyte replacement per protocol  -dosing lasix to help with vent liberation but close monitoring  Left pleural effusion -noted  Prediabetes  HbA1C 6.2 SSI   Nutrition Tube feeds  Best practice (right click and "Reselect all SmartList Selections" daily)  Diet:  Tube Feed  Pain/Anxiety/Delirium protocol (if indicated): Yes (RASS goal -1) VAP protocol (if indicated): Yes DVT prophylaxis: LMWH GI prophylaxis: PPI Glucose control:  SSI No Central venous access:  Yes, and it is no longer needed Arterial line:  N/A Foley:  Yes, and it is still needed Mobility:  bed rest  PT consulted: N/A Last date of multidisciplinary goals of care discussion [6/14] Code Status:  full code Disposition: ICU  Critical care time: The patient is critically ill with multiple organ systems failure and requires high complexity decision making for assessment and support, frequent evaluation and titration of therapies, application of advanced monitoring technologies and extensive interpretation of multiple databases.  Critical care time . This represents my time independent of the NPs time taking care of the pt. This is excluding procedures.

## 2020-12-09 NOTE — Progress Notes (Signed)
Inpatient Rehab Admissions Coordinator Note:   Per therapy recommendations, pt was screened for CIR candidacy by Megan Salon, MS CCC-SLP. At this time, Pt. Has bedrest orders, unable to work with therapies, so I will wait on placing CIR consult order for now.  [facility] team will follow and monitor for progress and participation with therapies. .  Please contact me with questions.   Megan Salon, MS, CCC-SLP Rehab Admissions Coordinator  [phone] (celll) [phone] (office)

## 2020-12-09 NOTE — Progress Notes (Signed)
Physical Therapy Evaluation Patient Details Name: Brandon Daniel MRN: [id number] DOB: 1951/01/18 Today's Date: 12/09/2020   History of Present Illness  Brandon Daniel is a 70 y.o. male admitted 6/9 that  presented with SOB, was found to be Influenza A + and later on day of admission developed mild R facial droop, R arm drift and hand grip weakness along with R leg weakness. Left MCA and ACA CVA. Pt received tPa. VDRF 6/11-6/15. PMH: CAD, HTN, lumbar stenosis, HLD  Clinical Impression  Pt admitted with above diagnosis.Bed level eval only per nursing. No active movement right hemibody. Pt does follow commands well. Was lethargic but was getting fentanyl for sedation.  Agree with Rehab consult and will progress pt as able. Should be extubated later today. Will follow acutely.  Pt currently with functional limitations due to the deficits listed below (see PT Problem List). Pt will benefit from skilled PT to increase their independence and safety with mobility to allow discharge to the venue listed below.       Follow Up Recommendations CIR    Equipment Recommendations  Other (comment) (TBA)    Recommendations for Other Services Rehab consult     Precautions / Restrictions Precautions Precautions: Fall Restrictions Weight Bearing Restrictions: No      Mobility  Bed Mobility               General bed mobility comments: NT - nurse asked for bed level eval only.    Transfers                 General transfer comment: TBA  Ambulation/Gait                Stairs            Wheelchair Mobility    Modified Rankin (Stroke Patients Only) Modified Rankin (Stroke Patients Only) Pre-Morbid Rankin Score: No symptoms Modified Rankin: Severe disability     Balance                                             Pertinent Vitals/Pain Pain Assessment: No/denies pain    Home Living Family/patient expects to be discharged to:: Private  residence Living Arrangements: Alone Available Help at Discharge: Available 24 hours/day (sister and brother in law with hired help) Type of Home: House Home Access: Stairs to enter;Ramped entrance   Secretary/administrator of Steps: 3 Home Layout: One level Home Equipment: Cane - single point Additional Comments: Nephew present and provided information above    Prior Function Level of Independence: Independent         Comments: DRiving, took care of himself, yardwork, plants, chickens     Hand Dominance   Dominant Hand: Right    Extremity/Trunk Assessment   Upper Extremity Assessment Upper Extremity Assessment: Defer to OT evaluation    Lower Extremity Assessment Lower Extremity Assessment: RLE deficits/detail RLE Deficits / Details: no active movement noted RLE Sensation: decreased light touch       Communication   Communication: Prefers language other than English;Other (comment) (Understands English well and speaks Arabic)  Cognition Arousal/Alertness: Lethargic;Suspect due to medications Behavior During Therapy: Flat affect Overall Cognitive Status: Difficult to assess  General Comments General comments (skin integrity, edema, etc.): VSS, pt on PEEP 5 and 40% FiO2    Exercises General Exercises - Upper Extremity Shoulder Flexion: PROM;Right;5 reps;Supine (AROM left 10 reps) Shoulder Horizontal ADduction: AROM;Left;10 reps;Supine Elbow Flexion: AROM;Left;10 reps;Supine General Exercises - Lower Extremity Ankle Circles/Pumps: AROM;Left;10 reps;Supine (P/ROM right) Heel Slides: AROM;Left;10 reps;Supine (P/ROM right) Straight Leg Raises: AROM;10 reps;Left;Supine   Assessment/Plan    PT Assessment Patient needs continued PT services  PT Problem List Decreased balance;Decreased mobility;Decreased activity tolerance;Decreased knowledge of use of DME;Decreased coordination;Decreased strength;Decreased range  of motion;Decreased knowledge of precautions;Decreased safety awareness;Cardiopulmonary status limiting activity;Impaired sensation       PT Treatment Interventions DME instruction;Functional mobility training;Therapeutic activities;Therapeutic exercise;Balance training;Cognitive remediation;Neuromuscular re-education;Patient/family education    PT Goals (Current goals can be found in the Care Plan section)  Acute Rehab PT Goals Patient Stated Goal: to get better PT Goal Formulation: With family Time For Goal Achievement: 12/23/20 Potential to Achieve Goals: Good    Frequency Min 4X/week   Barriers to discharge        Co-evaluation               AM-PAC PT "6 Clicks" Mobility  Outcome Measure Help needed turning from your back to your side while in a flat bed without using bedrails?: Total Help needed moving from lying on your back to sitting on the side of a flat bed without using bedrails?: Total Help needed moving to and from a bed to a chair (including a wheelchair)?: Total Help needed standing up from a chair using your arms (e.g., wheelchair or bedside chair)?: Total Help needed to walk in hospital room?: Total Help needed climbing 3-5 steps with a railing? : Total 6 Click Score: 6    End of Session   Activity Tolerance: Patient limited by fatigue Patient left: in bed;with call bell/phone within reach;with bed alarm set;with family/visitor present Nurse Communication: Mobility status PT Visit Diagnosis: Muscle weakness (generalized) (M62.81);Hemiplegia and hemiparesis Hemiplegia - Right/Left: Right Hemiplegia - dominant/non-dominant: Dominant Hemiplegia - caused by: Cerebral infarction    Time: 1142-1200 PT Time Calculation (min) (ACUTE ONLY): 18 min   Charges:   PT Evaluation $PT Eval Low Complexity: 1 Low          Ragina Fenter M,PT Acute Rehab Services [phone] (pager)   Bevelyn Buckles 12/09/2020, 2:20 PM

## 2020-12-09 NOTE — Progress Notes (Signed)
PT Cancellation Note  Patient Details Name: Pershing Skidmore MRN: [id number] DOB: [birth year]   Cancelled Treatment:    Reason Eval/Treat Not Completed: Medical issues which prohibited therapy;Active bedrest order (Pt has order for strict bedrest.  Please update activity level if PT is desired. Thanks.)   Bevelyn Buckles 12/09/2020, 8:56 AM Yessika Otte M,PT Acute Rehab Services [phone] (pager)

## 2020-12-09 NOTE — Consult Note (Signed)
Ref: Pcp, No   Subjective:  Awake. Denies chest pain. Remains intubated, Awaiting Cranial angiography by IR. Hgb and Creatinine remain stable post cardiac cath yesterday. Cardiac cath showed patent RCA and LCX with total occlusion of bypass grafts, LAD and diagonal vessels.. Distal lesions in RCA can be stented if patient has ischemia and improved physical condition from neurology point..  Objective:  Vital Signs in the last 24 hours: Temp:  [97.6 F (36.4 C)-100.1 F (37.8 C)] 97.6 F (36.4 C) (06/15 1512) Pulse Rate:  [69-98] 89 (06/15 1700) Cardiac Rhythm: Sinus tachycardia (06/15 1600) Resp:  [18-29] 26 (06/15 1700) BP: (130-169)/(61-123) 132/64 (06/15 1700) SpO2:  [88 %-100 %] 93 % (06/15 1700) FiO2 (%):  [30 %-50 %] 50 % (06/15 1700)  Physical Exam: BP Readings from Last 1 Encounters:  12/09/20 132/64     Wt Readings from Last 1 Encounters:  12/04/20 100.8 kg    Weight change:  Body mass index is 32.8 kg/m. HEENT: [place]/AT, Eyes-Blue, Conjunctiva-Pale, Sclera-Non-icteric Neck: No JVD, No bruit, Trachea midline. Lungs:  Clear,ing Bilateral. Cardiac:  Regular rhythm, normal S1 and S2, no S3. II/VI systolic murmur. Abdomen:  Soft, non-tender. BS present. Extremities:  No edema present. No cyanosis. No clubbing. CNS: AxOx1, Cranial nerves grossly intact, moves left sided extremities.  Skin: Warm and dry.   Intake/Output from previous day: 06/14 0701 - 06/15 0700 In: 1690.4 [I.V.:591.4; NG/GT:833.1; IV Piggyback:265.8] Out: 2375 [Urine:2375]    Lab Results: BMET    Component Value Date/Time   NA 142 12/09/2020 0317   NA 143 12/08/2020 0452   NA 144 12/07/2020 0353   K 3.9 12/09/2020 0317   K 3.3 (L) 12/08/2020 0452   K 3.7 12/07/2020 0353   CL 106 12/09/2020 0317   CL 111 12/08/2020 0452   CL 109 12/07/2020 0353   CO2 28 12/09/2020 0317   CO2 27 12/08/2020 0452   CO2 26 12/07/2020 0353   GLUCOSE 170 (H) 12/09/2020 0317   GLUCOSE 134 (H) 12/08/2020 0452    GLUCOSE 146 (H) 12/07/2020 0353   BUN 24 (H) 12/09/2020 0317   BUN 26 (H) 12/08/2020 0452   BUN 28 (H) 12/07/2020 0353   CREATININE 0.66 12/09/2020 0317   CREATININE 0.77 12/08/2020 0452   CREATININE 0.94 12/07/2020 0353   CALCIUM 7.8 (L) 12/09/2020 0317   CALCIUM 7.7 (L) 12/08/2020 0452   CALCIUM 7.5 (L) 12/07/2020 0353   GFRNONAA >60 12/09/2020 0317   GFRNONAA >60 12/08/2020 0452   GFRNONAA >60 12/07/2020 0353   CBC    Component Value Date/Time   WBC 4.3 12/09/2020 0317   RBC 3.12 (L) 12/09/2020 0317   HGB 8.8 (L) 12/09/2020 0317   HCT 29.4 (L) 12/09/2020 0317   PLT 163 12/09/2020 0317   MCV 94.2 12/09/2020 0317   MCH 28.2 12/09/2020 0317   MCHC 29.9 (L) 12/09/2020 0317   RDW 14.7 12/09/2020 0317   LYMPHSABS 0.5 (L) 12/06/2020 1300   MONOABS 0.6 12/06/2020 1300   EOSABS 0.0 12/06/2020 1300   BASOSABS 0.0 12/06/2020 1300   HEPATIC Function Panel No results for input(s): PROT in the last 8760 hours.  Invalid input(s):  ALBUMIN,  AST,  ALT,  ALKPHOS,  BILIDIR,  IBILI HEMOGLOBIN A1C No components found for: HGA1C,  MPG CARDIAC ENZYMES No results found for: CKTOTAL, CKMB, CKMBINDEX, TROPONINI BNP No results for input(s): PROBNP in the last 8760 hours. TSH No results for input(s): TSH in the last 8760 hours. CHOLESTEROL Recent Labs  12/04/20 0219  CHOL 108    Scheduled Meds:  arformoterol  15 mcg Nebulization BID   aspirin  81 mg Per Tube Daily   atorvastatin  80 mg Per Tube Daily   chlorhexidine gluconate (MEDLINE KIT)  15 mL Mouth Rinse BID   Chlorhexidine Gluconate Cloth  6 each Topical Daily   clopidogrel  75 mg Per Tube Daily   doxazosin  2 mg Per Tube Daily   enoxaparin (LOVENOX) injection  50 mg Subcutaneous Q24H   ezetimibe  10 mg Per Tube Daily   feeding supplement (PROSource TF)  45 mL Per Tube TID   gabapentin  600 mg Per Tube BID   insulin aspart  0-15 Units Subcutaneous Q4H   ipratropium-albuterol  3 mL Nebulization BID   mouth rinse  15  mL Mouth Rinse 10 times per day   metoprolol tartrate  25 mg Per Tube BID   montelukast  10 mg Per Tube QHS   oseltamivir  75 mg Per Tube BID   pantoprazole (PROTONIX) IV  40 mg Intravenous QHS   polyethylene glycol  17 g Per Tube Daily   sodium chloride flush  10-40 mL Intracatheter Q12H   sodium chloride flush  3 mL Intravenous Q12H   Continuous Infusions:  sodium chloride Stopped (12/09/20 1212)   sodium chloride     cefTRIAXone (ROCEPHIN)  IV Stopped (12/08/20 2344)   feeding supplement (VITAL 1.5 CAL) 1,000 mL (12/09/20 1607)   fentaNYL infusion INTRAVENOUS 100 mcg/hr (12/09/20 1700)   PRN Meds:.sodium chloride, acetaminophen, albuterol, fentaNYL, guaiFENesin-dextromethorphan, midazolam, nitroGLYCERIN, ondansetron (ZOFRAN) IV, senna-docusate, sodium chloride flush, sodium chloride flush  Assessment/Plan:  Acute NSTEMI 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 Cath finding reviewed with Dr. Casandra Doffing who prefers to hold off RCA angioplasty for now. Discussed with Royetta Car, patient's need for rehab first and then possible RCA angioplasty if overall condition improves.   LOS: 6 days   Time spent including chart review, lab review, examination, discussion with patient/Nurse/Family : 45 min   Dixie Dials  MD  12/09/2020, 5:35 PM

## 2020-12-10 DIAGNOSIS — J101 Influenza due to other identified influenza virus with other respiratory manifestations: Secondary | ICD-10-CM | POA: Diagnosis not present

## 2020-12-10 DIAGNOSIS — J9601 Acute respiratory failure with hypoxia: Secondary | ICD-10-CM | POA: Diagnosis not present

## 2020-12-10 LAB — CBC
HCT: 29.1 % — ABNORMAL LOW (ref 39.0–52.0)
Hemoglobin: 8.8 g/dL — ABNORMAL LOW (ref 13.0–17.0)
MCH: 28.3 pg (ref 26.0–34.0)
MCHC: 30.2 g/dL (ref 30.0–36.0)
MCV: 93.6 fL (ref 80.0–100.0)
Platelets: 177 10*3/uL (ref 150–400)
RBC: 3.11 MIL/uL — ABNORMAL LOW (ref 4.22–5.81)
RDW: 14.8 % (ref 11.5–15.5)
WBC: 5.5 10*3/uL (ref 4.0–10.5)
nRBC: 0 % (ref 0.0–0.2)

## 2020-12-10 LAB — BASIC METABOLIC PANEL
Anion gap: 9 (ref 5–15)
Anion gap: 9 (ref 5–15)
BUN: 27 mg/dL — ABNORMAL HIGH (ref 8–23)
BUN: 27 mg/dL — ABNORMAL HIGH (ref 8–23)
CO2: 29 mmol/L (ref 22–32)
CO2: 30 mmol/L (ref 22–32)
Calcium: 8.2 mg/dL — ABNORMAL LOW (ref 8.9–10.3)
Calcium: 8.3 mg/dL — ABNORMAL LOW (ref 8.9–10.3)
Chloride: 103 mmol/L (ref 98–111)
Chloride: 103 mmol/L (ref 98–111)
Creatinine, Ser: 0.76 mg/dL (ref 0.61–1.24)
Creatinine, Ser: 0.74 mg/dL (ref 0.61–1.24)
GFR, Estimated: >60 mL/min (ref >60)
GFR, Estimated: >60 mL/min (ref >60)
Glucose, Bld: 204 mg/dL — ABNORMAL HIGH (ref 70–99)
Glucose, Bld: 173 mg/dL — ABNORMAL HIGH (ref 70–99)
Potassium: 4 mmol/L (ref 3.5–5.1)
Potassium: 3.8 mmol/L (ref 3.5–5.1)
Sodium: 141 mmol/L (ref 135–145)
Sodium: 142 mmol/L (ref 135–145)

## 2020-12-10 LAB — GLUCOSE, CAPILLARY
Glucose-Capillary: 168 mg/dL — ABNORMAL HIGH (ref 70–99)
Glucose-Capillary: 182 mg/dL — ABNORMAL HIGH (ref 70–99)
Glucose-Capillary: 192 mg/dL — ABNORMAL HIGH (ref 70–99)
Glucose-Capillary: 203 mg/dL — ABNORMAL HIGH (ref 70–99)
Glucose-Capillary: 205 mg/dL — ABNORMAL HIGH (ref 70–99)
Glucose-Capillary: 213 mg/dL — ABNORMAL HIGH (ref 70–99)

## 2020-12-10 LAB — MAGNESIUM: Magnesium: 2.2 mg/dL (ref 1.7–2.4)

## 2020-12-10 MED ORDER — VITAL 1.5 CAL PO LIQD
1000.0000 mL | ORAL | Status: DC
  Administered 2020-12-11 – 2020-12-18 (×6): 1000 mL
  Filled 2020-12-10 (×5): qty 1000

## 2020-12-10 MED ORDER — FUROSEMIDE 10 MG/ML IJ SOLN
40.0000 mg | Freq: Once | INTRAMUSCULAR | Status: AC
  Administered 2020-12-10: 40 mg via INTRAVENOUS
  Filled 2020-12-10: qty 4

## 2020-12-10 NOTE — Progress Notes (Signed)
OT Cancellation Note  Patient Details Name: Brandon Daniel MRN: [id number] DOB: 1951/03/03   Cancelled Treatment:    Reason Eval/Treat Not Completed: Patient not medically ready.  Pt currently weaning.  RN and RT requested therapies hold during wean.  Will reaattempt.  [name]., OTR/L Acute Rehabilitation Services Pager [phone] Office [phone]   [name] M 12/10/2020, 9:32 AM

## 2020-12-10 NOTE — Progress Notes (Signed)
PT Cancellation Note  Patient Details Name: Emmauel Hallums MRN: [id number] DOB: 03-22-1951   Cancelled Treatment:    Reason Eval/Treat Not Completed: Medical issues which prohibited therapy (Pt is weaning.  RT/Nurse ask PT/OT to HOLD today.)   Bevelyn Buckles 12/10/2020, 1:22 PM Ziaire Hagos M,PT Acute Rehab Services [phone] (pager)

## 2020-12-10 NOTE — Consult Note (Signed)
Ref: Pcp, No   Subjective:  Awake. Intubated.  Objective:  Vital Signs in the last 24 hours: Temp:  [98 F (36.7 C)-101.8 F (38.8 C)] 99.8 F (37.7 C) (06/16 1652) Pulse Rate:  [69-99] 99 (06/16 1700) Cardiac Rhythm: Normal sinus rhythm;Sinus tachycardia (06/16 1600) Resp:  [19-29] 27 (06/16 1700) BP: (119-175)/(54-86) 141/76 (06/16 1700) SpO2:  [88 %-98 %] 88 % (06/16 1700) FiO2 (%):  [40 %-50 %] 40 % (06/16 1650) Weight:  [100.9 kg] 100.9 kg (06/16 0500)  Physical Exam: BP Readings from Last 1 Encounters:  12/10/20 (!) 141/76     Wt Readings from Last 1 Encounters:  12/10/20 100.9 kg    Weight change:  Body mass index is 32.83 kg/m. HEENT: [place]/AT, Eyes-Blue, Conjunctiva-Pale, Sclera-Non-icteric Neck: No JVD, No bruit, Trachea midline. Lungs:  Clearing, Bilateral. Cardiac:  Regular rhythm, normal S1 and S2, no S3. II/VI systolic murmur. Abdomen:  Soft, non-tender. BS present. Extremities:  No edema present. No cyanosis. No clubbing. CNS: AxOx3, Cranial nerves grossly intact, moves left sided extremities.  Skin: Warm and dry.   Intake/Output from previous day: 06/15 0701 - 06/16 0700 In: 1636.5 [I.V.:412.8; NG/GT:1123.6; IV Piggyback:100.1] Out: 3120 [Urine:3120]    Lab Results: BMET    Component Value Date/Time   NA 142 12/10/2020 0636   NA 141 12/10/2020 0138   NA 142 12/09/2020 0317   K 3.8 12/10/2020 0636   K 4.0 12/10/2020 0138   K 3.9 12/09/2020 0317   CL 103 12/10/2020 0636   CL 103 12/10/2020 0138   CL 106 12/09/2020 0317   CO2 30 12/10/2020 0636   CO2 29 12/10/2020 0138   CO2 28 12/09/2020 0317   GLUCOSE 173 (H) 12/10/2020 0636   GLUCOSE 204 (H) 12/10/2020 0138   GLUCOSE 170 (H) 12/09/2020 0317   BUN 27 (H) 12/10/2020 0636   BUN 27 (H) 12/10/2020 0138   BUN 24 (H) 12/09/2020 0317   CREATININE 0.74 12/10/2020 0636   CREATININE 0.76 12/10/2020 0138   CREATININE 0.66 12/09/2020 0317   CALCIUM 8.3 (L) 12/10/2020 0636   CALCIUM 8.2 (L)  12/10/2020 0138   CALCIUM 7.8 (L) 12/09/2020 0317   GFRNONAA >60 12/10/2020 0636   GFRNONAA >60 12/10/2020 0138   GFRNONAA >60 12/09/2020 0317   CBC    Component Value Date/Time   WBC 5.5 12/10/2020 0138   RBC 3.11 (L) 12/10/2020 0138   HGB 8.8 (L) 12/10/2020 0138   HCT 29.1 (L) 12/10/2020 0138   PLT 177 12/10/2020 0138   MCV 93.6 12/10/2020 0138   MCH 28.3 12/10/2020 0138   MCHC 30.2 12/10/2020 0138   RDW 14.8 12/10/2020 0138   LYMPHSABS 0.5 (L) 12/06/2020 1300   MONOABS 0.6 12/06/2020 1300   EOSABS 0.0 12/06/2020 1300   BASOSABS 0.0 12/06/2020 1300   HEPATIC Function Panel No results for input(s): PROT in the last 8760 hours.  Invalid input(s):  ALBUMIN,  AST,  ALT,  ALKPHOS,  BILIDIR,  IBILI HEMOGLOBIN A1C No components found for: HGA1C,  MPG CARDIAC ENZYMES No results found for: CKTOTAL, CKMB, CKMBINDEX, TROPONINI BNP No results for input(s): PROBNP in the last 8760 hours. TSH No results for input(s): TSH in the last 8760 hours. CHOLESTEROL Recent Labs    12/04/20 0219  CHOL 108    Scheduled Meds:  arformoterol  15 mcg Nebulization BID   aspirin  81 mg Per Tube Daily   atorvastatin  80 mg Per Tube Daily   chlorhexidine gluconate (MEDLINE KIT)  15  mL Mouth Rinse BID   Chlorhexidine Gluconate Cloth  6 each Topical Daily   clopidogrel  75 mg Per Tube Daily   enoxaparin (LOVENOX) injection  50 mg Subcutaneous Q24H   ezetimibe  10 mg Per Tube Daily   feeding supplement (PROSource TF)  45 mL Per Tube TID   gabapentin  600 mg Per Tube BID   insulin aspart  0-15 Units Subcutaneous Q4H   ipratropium-albuterol  3 mL Nebulization BID   mouth rinse  15 mL Mouth Rinse 10 times per day   metoprolol tartrate  25 mg Per Tube BID   montelukast  10 mg Per Tube QHS   pantoprazole (PROTONIX) IV  40 mg Intravenous QHS   polyethylene glycol  17 g Per Tube Daily   sodium chloride flush  10-40 mL Intracatheter Q12H   sodium chloride flush  3 mL Intravenous Q12H   Continuous  Infusions:  sodium chloride Stopped (12/10/20 1208)   sodium chloride     feeding supplement (VITAL 1.5 CAL) 60 mL/hr at 12/10/20 1631   fentaNYL infusion INTRAVENOUS 100 mcg/hr (12/10/20 1702)   PRN Meds:.sodium chloride, acetaminophen, albuterol, fentaNYL, guaiFENesin-dextromethorphan, midazolam, nitroGLYCERIN, ondansetron (ZOFRAN) IV, senna-docusate, sodium chloride flush, sodium chloride flush  Assessment/Plan:  Acute NSTEMI Multivessel CAD CABG Acute respiratory failure with hypoxia 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 Possible Distal RCA angioplasty in future if overall condition improves.   LOS: 7 days   Time spent including chart review, lab review, examination, discussion with patient/Nurse : 30 min   Dixie Dials  MD  12/10/2020, 5:31 PM

## 2020-12-10 NOTE — Progress Notes (Addendum)
STROKE TEAM PROGRESS NOTE    Interval History  He remains intubated, unfortunately CCM has been unable to extubate him given his weaning trials have not gone well with noted mucous plugging and difficulty clearing secretions. Failed SBT yesterday. CCM is continuing to attempt to wean today.   Brother in law is at bedside, he called his son Kandis Mannan and both were updated in person/via telephone.   Pertinent Lab Work and Imaging    12/04/20 CT Head WO IV Contrast 1. No acute intracranial abnormality. 2. ASPECTS is 10.  12/03/20 CT Angio Head and Neck W WO IV Contrast 1. Negative CTA for emergent large vessel occlusion. 2. Extensive bulky calcified plaque about the left carotid bulb/proximal left ICA with associated severe near occlusive stenosis. Left ICA, MCA, and ACA markedly attenuated but remain patent distally. 3. Short-segment 70% atheromatous stenosis at the proximal cervical right ICA. 4. Severe left and moderate right vertebral artery origin stenoses.  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  12/04/20 Echocardiogram Complete  1. Left ventricular ejection fraction, by estimation, is 55 to 60%. The left ventricle has normal function. The left ventricle has no regional wall motion abnormalities. Left ventricular diastolic parameters are  indeterminate. Elevated left ventricular end-diastolic pressure.   2. Right ventricular systolic function is normal. The right ventricular size is normal. There is normal pulmonary artery systolic pressure. The estimated right ventricular systolic pressure is 11.0 mmHg.   3. The mitral valve is normal in structure. No evidence of mitral valve regurgitation. No evidence of mitral stenosis.   4. The aortic valve was not well visualized. Aortic valve regurgitation is not visualized. No aortic stenosis is present.   5. The inferior vena cava is normal in size with greater than 50% respiratory  variability, suggesting right atrial pressure of 3 mmHg.   12/04/20 VAS US Carotid  Right ICA 60-79% stenosis, left ICA 80-99% stenosis   Physical Examination   Constitutional: Calm, appropriate for condition  Cardiovascular: Normal RR Respiratory: No increased WOB   Mental status: Intubated and sedated. Alert, eyes open. Unable to answer LOC questions due to aphasia. Follows commands ( two fingers, thumbs up, makes fist- does need some coaching to follow)  Speech: Globally aphasic Cranial nerves: L Gaze preference, Visual fields difficult to assess but seem full with BTT, Right facial weakness, Tongue midline  Motor: LUE 3/5 spontaneous movement, LLE 2/5 spontaneous movement, RUE 0/5, RLE 0/5 Sensory: Decreased sensation to the right, does not respond to noxious stimulation  Coordination: UTA given sedation  Gait: UTA given patient acuity   Assessment and Plan   [name] is a 70 y.o. male w/pmh of CAD, HTN, lumbar stenosis, HLD who initially presented with SOB, was found to be Influenza A + and later on day of admission developed mild R facial droop, R arm drift and hand grip weakness along with R leg weakness. Code stroke was initiated and received TPA.   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 RICA was also notably stenosed. CUS revealed RICA w/60-79 % stenosis, LICA with 80-99 % stenosis  Echo: EF 55-60%, LA normal in size  Stroke labs: LDL 55, Hemoglobin A1C 6.2 Stroke etiology is atherosclerosis  DAPT for secondary stroke prevention with Aspirin  81 mg + Plavix 75 mg QD  Therapy recommendations: TBD, no recs as of yet due to patient acuity  Disposition: TBD  At discharge will place ambulatory referral to neurology for stroke follow up   LICA  stenosis Neuro IR consulted for LICA stenosis on 6/13 for intervention- they would like to establish neuro baseline post extubation prior to intervention   CARDS  NSTEMI  Cardiomyopathy history Elevated troponin 5,741 -> 24.00 Cardiology consulted for New ST depression in V6 and worsening T wave inversion in inferior leads; likely demand ischemia in the setting of known coronary artery disease (18 stents) and worsening hypoxia and acidosis. He underwent a cardiac cath on 12/08/20 that showed multivessel disease; recommending medical treatment for now and state that distal lesions in RCA can be stented if patient has ischemia and has improved physical condition from a neurological standpoint.   Essential Hypertension (History) Home medication: HCTZ 25 QD, Atenolol 100 mg QD, Lisinopril 20 mg QD at home SBP goal 90-140/50-85, currently pressures are trending 140-160 Continue Metoprolol 5mg  Q 6hr  Hyperlipidemia LDL goal is < 70. LDL is 55 and is at goal.  Atorvastatin 80 mg and Zetia 10mg  daily  RESPIRATORY  Acute Respiratory Failure  Influenza Pneumonia  Left Pleural Effusion  Prior to admission he had SOB and cough. Noted to be hypoxic in the ED. Was found to be Influenza A positive Intubated on 6/11 for increased WOB and hypoxia CXR bilateral infiltrates and pulmonary edema Albuterol nebs prn, Brovana bid, Duonebs CCM on board  RENAL  Acute Kidney Injury ( Resolved)  Creatinine previously elevated at 1.26, AKI now resolved  NS infusion CCM on board  ENDO  Diabetes Hemoglobin A1C 6.2, goal <7 SSI CBGs  GI/GU  Dysphagia  Dysphagia in the setting of L MCA stroke  SLP assessed him on 6/10, noted dysphagia and recommended NPO.  Has OG tube + continuous feeds  CCM on board   Urinary retention Foley placement Voiding trial post extubation   HEME  DVT Prophylaxis  SCDs, Heparin infusion  ID  Influenza A Pneumonia  Screened for influenza given recent travel to  and ongoing SOB prior to admission. Noted to be febrile and hypoxic this admission. Tamiflu 75 BID for 5 days- 12/03/20 to 12/07/20 Treated with Azithromycin 6/11 to 6/14  Ceftriaxone 2 g Q24 hr started 6/11-6/15  Fever  Trending CBC, so far no leukocytosis. Continues to have intermittent fever.  Increased sputum- culture w/moderate WBC few gram positive and negative rods  CXR and UA ordered 6/14 as a part of fever work up. 6/14 CXR with slight increase in the degree of left basilar consolidation with associated effusion. 6/14 UA negative for UTI.  CCM on board and are considering further fever work up on 6/17 pending course    Hospital day # 7  7/14, AGPCNP-BC  Stroke NP  ATTENDING NOTE: I reviewed above note and agree with the assessment and plan. Pt was seen and examined.   Nephew at bed brother at bedside.  Discussed with Dr. 7/17 CCM, patient was not able to be extubated yesterday and this morning due to respiratory issue including desaturation, tachypnea, increased secretion and agitation on weaning off ventilation.  Currently, patient neuro stable, eyes spontaneously open, still has left gaze preference, not cross midline.  Blinking to visual threat on the left, inconsistent on the right.  Left upper extremity against gravity without drift, able to follow simple commands.  Left lower extremity at least  2/5.  Right upper and lower extremity still flaccid.  Head fever 101.8 this morning, currently afebrile.  No leukocytosis, still consider fever related to flu.  Cardiology will consider distal RCA angioplasty in the future.  Neuro IR also will consider possible carotid stenting as outpatient due to current medical instability.  CCM on board, trying to extubate as able.  However, due to possibility for trach and PEG if failed a sedation.  I had long discussion with patient brother at bedside and nephew over the phone at bedside, updated pt current condition, treatment  plan and potential prognosis, and answered all the questions.  They expressed understanding and appreciation.  I also discussed with Dr. Gaynell Face.  This patient is critically ill due to respiratory failure needing intubation, left ICA high-grade stenosis, left MCA stroke, non-STEMI, multivessel CAD, fever and at significant risk of neurological worsening, death form recurrent stroke, cardio arrest, heart failure, sepsis, seizure. This patient's care requires constant monitoring of vital signs, hemodynamics, respiratory and cardiac monitoring, review of multiple databases, neurological assessment, discussion with family, other specialists and medical decision making of high complexity. I spent 40 minutes of neurocritical care time in the care of this patient.   Marvel Plan, MD PhD Stroke Neurology 12/10/2020 7:33 AM     To contact Stroke Continuity provider, please refer to [email or web address]. After hours, contact General Neurology

## 2020-12-10 NOTE — Progress Notes (Addendum)
NAME:  Brandon Daniel, MRN:  [id number], DOB:  17-Oct-1950, LOS: 7 ADMISSION DATE:  12/03/2020, CONSULTATION DATE:  12/04/20 REFERRING MD:  Derry Lory, CHIEF COMPLAINT:  weakness   History of Present Illness:  70 year old man with hx of CAD, HTN, HLD admitted for SOB from influenza A on 12/03/20.  Developed R face and R sided weakness this evening and received TPA after normal head CT.  After this he developed some worsening responsiveness so another CT ordered which looks fine. PCCM consulted to monitor airway.  Pertinent  Medical History  Asthma/COPD overlad CAD HLD HTN  Significant Hospital Events: Including procedures, antibiotic start and stop dates in addition to other pertinent events   6/9 admitted 6/9 evening code stroke, tpa, PCCM eval 6/10 Intubated for resp distress 6/11 Lt IJ CVL placed, levo for hypotension, started heparin drip for NSTEMI and cardiology consulted 6/14 Left Heart Cath   Interim History / Subjective:  Awake, following commands, remains on the ventilator. Right hemiparesis present.   Overnight, Nursing reports 2 short runs of SVT/ He then returned to NSR. BMP and Mg+ was checked.  Patient was also febrile overnight with a tmax of  101.8.  Patient failed SBT yesterday.  Current, Ventilator setting on sbt Fi02 40 and PEEP of 5 with PS 10. Will attempt further wean today as IR and neuro would like extubation prior to intervention.  Objective   Blood pressure (!) 147/70, pulse 73, temperature 99.4 F (37.4 C), temperature source Axillary, resp. rate 19, height 5' 9.02" (1.753 m), weight 100.9 kg, SpO2 97 %.    Vent Mode: PRVC FiO2 (%):  [40 %-50 %] 50 % Set Rate:  [18 bmp] 18 bmp Vt Set:  [490 mL] 490 mL PEEP:  [5 cmH20] 5 cmH20 Pressure Support:  [12 cmH20] 12 cmH20 Plateau Pressure:  [18 cmH20-19 cmH20] 19 cmH20   Intake/Output Summary (Last 24 hours) at 12/10/2020 0827 Last data filed at 12/10/2020 0600 Gross per 24 hour  Intake 1613.52 ml  Output  2930 ml  Net -1316.48 ml   Filed Weights   12/03/20 1400 12/04/20 0500 12/10/20 0500  Weight: 99.2 kg 100.8 kg 100.9 kg    Examination: Gen:      No acute distress on vent`. Patient alert in bed HEENT: ncat,sclera anicteric, ETT Neck:     No masses; no thyromegaly Lungs:   Bilateral rhonchi CV:         Regular rate and rhythm; no murmurs Abd:      + bowel sounds; soft, non-tender; no palpable masses, no distension Ext:    Mild le edema; adequate peripheral perfusion Skin:      Warm and dry; no rash Neuro: Right hemiparesis, follows commands.  Appropriate accommodation in both eyes.  Labs/imaging that I havepersonally reviewed  (right click and "Reselect all SmartList Selections" daily)  Na 142 Potassium 3.8 creatinine 0.74 WBC 5.5 hemoglobin 8.8  Resolved Hospital Problem list   N/a  Assessment & Plan:  Acute respiratory failure due to inability to clear secretions, acute pulmonary edema, Influenza A pna Fever Follow chest x-ray prn Re-dose lasix, still 2.5L positive over hospital stay and unable to wean vent continuously  Ceftriaxone Complete 6/15  Azithromycin Complete 6/14 Tracheal aspirate culture: normal flora Completed tamiflu influenza pneumonia -pt with non infectious reasons for fever in addition to his known flu, but if remains febrile tomorrow could consider further infectious w/u 6/17    Left ACA, MCA stroke s/p tPA Management per neurology Will  need carotid endarterectomy for stenosis when stable Neuro IR, possible diagnostic cerebral arteriogram once extubated  Non ST elevation MI, history of cardiomyopathy, acute pulmonary edema Marked elevation in troponins noted.  He has extensive cardiac history Cardiology following  Heparin was stopped 6/14 after LHC and placed on asa and plavix -cont subcut lovenox Continue  Metoprolol  Continue Lipitor, Zetia -LHC revealed restenosed grafts and multivessel disease   Low hemoglobin.  No evidence of active  bleed Likely secondary to critical illness Follow CBC Stable  Hgb 8.8; no transfusion needed at this time.  Consider transfusion if <7 or patient presentation worsens  AKI, improved  Supportive care, monitor urine output and creatinine Electrolyte replacement per protocol  -dosing lasix to help with vent liberation but close monitoring of indices  Left pleural effusion -noted  Prediabetes  HbA1C 6.2 SSI   Nutrition Tube feeds  Best practice (right click and "Reselect all SmartList Selections" daily)  Diet:  Tube Feed  Pain/Anxiety/Delirium protocol (if indicated): Yes (RASS goal -1) VAP protocol (if indicated): Yes DVT prophylaxis: LMWH GI prophylaxis: PPI Glucose control:  SSI No Central venous access:  Yes, and it is no longer needed Arterial line:  N/A Foley:  Yes, and it is still needed Mobility:  bed rest  PT consulted: N/A Last date of multidisciplinary goals of care discussion [6/14] Code Status:  full code Disposition: ICU   Dennie Bible MS4 Critical care time: The patient is critically ill with multiple organ systems failure and requires high complexity decision making for assessment and support, frequent evaluation and titration of therapies, application of advanced monitoring technologies and extensive interpretation of multiple databases.  Critical care time 36 mins. This represents my time independent of the NPs time taking care of the pt. This is excluding procedures.    Briant Sites DO [facility] 12/10/2020, 9:40 AM See Amion for pager If no response to pager, please call [phone] until 1900 After 1900 please call [facility] [phone]

## 2020-12-10 NOTE — Progress Notes (Signed)
Nutrition Follow-up  DOCUMENTATION CODES:   Obesity unspecified  INTERVENTION:   - plan to replace OG tube with Cortrak NG tube tomorrow  Continue tube feeds via OG tube: - Increase Vital 1.5 to 60 ml/hr (1440 ml/day) - ProSource TF 45 ml TID   Tube feeding regimen provides 2280 kcal, 130 grams of protein, and 1100 ml of H2O.  NUTRITION DIAGNOSIS:   Inadequate oral intake related to inability to eat as evidenced by NPO status.  Ongoing, being addressed via TF  GOAL:   Patient will meet greater than or equal to 90% of their needs  Met via TF  MONITOR:   Diet advancement, Labs, Weight trends, I & O's  REASON FOR ASSESSMENT:   Ventilator, Consult Enteral/tube feeding initiation and management  ASSESSMENT:   70 year old male who presented to the ED on 6/09 with SOB. 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 Pt found to have NSTEMI and influenza A. Pt developed R facial droop, R arm drift and hand grip weakness along with R leg weakness and pt likely with CVA s/p tPA.  6/11 - intubated 6/14 - s/p LHC  Discussed pt with CCM. No plan for extubation today as pt has had difficulty with weaning. CCM would like pt to receive a Cortrak tomorrow as he is unlikely to be able to swallow after he is extubated. Orders in place.  Per notes, IR would like pt to be extubated prior to intervention.  Admit weight: 99.2 kg Current weight: 100.9 kg  Per nursing edema assessment, pt with non-pitting edema to RUE and RLE.  Patient is currently intubated on ventilator support MV: 12.6 L/min Temp (24hrs), Avg:99.5 F (37.5 C), Min:97.6 F (36.4 C), Max:101.8 F (38.8 C)  Drip: Fentanyl  Medications reviewed and include: SSI q 4 hours, IV protonix, miralax  Labs reviewed: BUN 27, hemoglobin 8.8 CBG's: 111-211 x 24 hours  UOP: 3120 ml x 24 hours I/O's: +1.9 L since admit  Diet Order:   Diet Order             Diet NPO time specified  Diet effective now                    EDUCATION NEEDS:   No education needs have been identified at this time  Skin:  Skin Assessment: Reviewed RN Assessment  Last BM:  12/10/20 medium type 6  Height:   Ht Readings from Last 1 Encounters:  12/05/20 5' 9.02" (1.753 m)    Weight:   Wt Readings from Last 1 Encounters:  12/10/20 100.9 kg    BMI:  Body mass index is 32.83 kg/m.  Estimated Nutritional Needs:   Kcal:  2300-2500  Protein:  120-140 grams  Fluid:  >/= 2.0 L    Gustavus Bryant, MS, RD, LDN Inpatient Clinical Dietitian Please see AMiON for contact information.

## 2020-12-10 NOTE — Progress Notes (Signed)
eLink Physician-Brief Progress Note Patient Name: Brandon Daniel DOB: [birth year] MRN: [id number]   Date of Service  12/10/2020  HPI/Events of Note  Nursing reports 2 short runs of SVT. Now NSR.   eICU Interventions  Plan: BMP and Mg++ level STAT.     Intervention Category Major Interventions: Arrhythmia - evaluation and management  Nguyen Todorov Eugene 12/10/2020, 6:08 AM

## 2020-12-10 NOTE — Progress Notes (Signed)
PT Cancellation Note  Patient Details Name: Kouper Spinella MRN: [id number] DOB: 03-Nov-1950   Cancelled Treatment:    Reason Eval/Treat Not Completed: Medical issues which prohibited therapy (Pt weaning currently.  Nurse asked [place] to check back at 1 pm.  Plan to check back.)   Bevelyn Buckles 12/10/2020, 9:39 AM Rhealyn Cullen M,PT Acute Rehab Services [phone] (pager)

## 2020-12-11 ENCOUNTER — Inpatient Hospital Stay (HOSPITAL_COMMUNITY): Payer: Medicare (Managed Care)

## 2020-12-11 DIAGNOSIS — J101 Influenza due to other identified influenza virus with other respiratory manifestations: Secondary | ICD-10-CM | POA: Diagnosis not present

## 2020-12-11 LAB — CBC
HCT: 30.5 % — ABNORMAL LOW (ref 39.0–52.0)
Hemoglobin: 9.4 g/dL — ABNORMAL LOW (ref 13.0–17.0)
MCH: 28.9 pg (ref 26.0–34.0)
MCHC: 30.8 g/dL (ref 30.0–36.0)
MCV: 93.8 fL (ref 80.0–100.0)
Platelets: 234 10*3/uL (ref 150–400)
RBC: 3.25 MIL/uL — ABNORMAL LOW (ref 4.22–5.81)
RDW: 14.7 % (ref 11.5–15.5)
WBC: 6.9 10*3/uL (ref 4.0–10.5)
nRBC: 0 % (ref 0.0–0.2)

## 2020-12-11 LAB — BASIC METABOLIC PANEL
Anion gap: 9 (ref 5–15)
BUN: 27 mg/dL — ABNORMAL HIGH (ref 8–23)
CO2: 31 mmol/L (ref 22–32)
Calcium: 8.6 mg/dL — ABNORMAL LOW (ref 8.9–10.3)
Chloride: 101 mmol/L (ref 98–111)
Creatinine, Ser: 0.71 mg/dL (ref 0.61–1.24)
GFR, Estimated: >60 mL/min (ref >60)
Glucose, Bld: 238 mg/dL — ABNORMAL HIGH (ref 70–99)
Potassium: 3.8 mmol/L (ref 3.5–5.1)
Sodium: 141 mmol/L (ref 135–145)

## 2020-12-11 LAB — GLUCOSE, CAPILLARY
Glucose-Capillary: 215 mg/dL — ABNORMAL HIGH (ref 70–99)
Glucose-Capillary: 195 mg/dL — ABNORMAL HIGH (ref 70–99)
Glucose-Capillary: 166 mg/dL — ABNORMAL HIGH (ref 70–99)
Glucose-Capillary: 129 mg/dL — ABNORMAL HIGH (ref 70–99)
Glucose-Capillary: 197 mg/dL — ABNORMAL HIGH (ref 70–99)
Glucose-Capillary: 216 mg/dL — ABNORMAL HIGH (ref 70–99)

## 2020-12-11 MED ORDER — FUROSEMIDE 10 MG/ML IJ SOLN
40.0000 mg | Freq: Once | INTRAMUSCULAR | Status: AC
  Administered 2020-12-11: 40 mg via INTRAVENOUS
  Filled 2020-12-11: qty 4

## 2020-12-11 MED ORDER — LOSARTAN POTASSIUM 50 MG PO TABS
25.0000 mg | ORAL_TABLET | Freq: Every day | ORAL | Status: DC
  Administered 2020-12-12 – 2020-12-16 (×5): 25 mg
  Filled 2020-12-11 (×5): qty 1

## 2020-12-11 MED ORDER — LOSARTAN POTASSIUM 50 MG PO TABS
25.0000 mg | ORAL_TABLET | Freq: Every day | ORAL | Status: DC
  Administered 2020-12-11: 25 mg via ORAL
  Filled 2020-12-11: qty 1

## 2020-12-11 MED ORDER — INSULIN GLARGINE 100 UNIT/ML ~~LOC~~ SOLN
10.0000 [IU] | Freq: Every day | SUBCUTANEOUS | Status: DC
  Administered 2020-12-11: 10 [IU] via SUBCUTANEOUS
  Filled 2020-12-11 (×2): qty 0.1

## 2020-12-11 NOTE — Progress Notes (Signed)
STROKE TEAM PROGRESS NOTE    Interval History  His RN is at the bedside. Pt was just extubated this morning, so far tolerating, however, needs close monitoring, can not exclude the possibility to re-intubate at this time. Pt still has expressive aphasia, partial receptive aphasia and right hemiplegia. Glucose on the high end, will need cortrak for TF and novolog coverage.   Pertinent Lab Work and Imaging    12/04/20 CT Head WO IV Contrast 1. No acute intracranial abnormality. 2. ASPECTS is 10.  12/03/20 CT Angio Head and Neck W WO IV Contrast 1. Negative CTA for emergent large vessel occlusion. 2. Extensive bulky calcified plaque about the left carotid bulb/proximal left ICA with associated severe near occlusive stenosis. Left ICA, MCA, and ACA markedly attenuated but remain patent distally. 3. Short-segment 70% atheromatous stenosis at the proximal cervical right ICA. 4. Severe left and moderate right vertebral artery origin stenoses.  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  12/04/20 Echocardiogram Complete  1. Left ventricular ejection fraction, by estimation, is 55 to 60%. The left ventricle has normal function. The left ventricle has no regional wall motion abnormalities. Left ventricular diastolic parameters are  indeterminate. Elevated left ventricular end-diastolic pressure.   2. Right ventricular systolic function is normal. The right ventricular size is normal. There is normal pulmonary artery systolic pressure. The estimated right ventricular systolic pressure is 11.0 mmHg.   3. The mitral valve is normal in structure. No evidence of mitral valve regurgitation. No evidence of mitral stenosis.   4. The aortic valve was not well visualized. Aortic valve regurgitation is not visualized. No aortic stenosis is present.   5. The inferior vena cava is normal in size with greater than 50% respiratory variability, suggesting  right atrial pressure of 3 mmHg.   12/04/20 VAS US Carotid  Right ICA 60-79% stenosis, left ICA 80-99% stenosis   Physical Examination   Constitutional: Calm, appropriate for condition  Cardiovascular: Normal RR Respiratory: No increased WOB   Mental status: extubated this am. Lethargic, eyes open on voice. Unable to answer LOC questions due to aphasia. Follows commands ( two fingers, thumbs up, makes fist- does need some coaching to follow)  Speech: Globally aphasic Cranial nerves: L Gaze preference, Visual fields difficult to assess but seem full with BTT, Right facial weakness, Tongue midline  Motor: LUE 3/5 spontaneous movement, LLE 2/5 spontaneous movement, RUE 0/5, RLE 0/5 Sensory: Decreased sensation to the right, does not respond to noxious stimulation  Coordination: UTA given sedation  Gait: UTA given patient acuity   Assessment and Plan   Brandon Daniel is a 70 y.o. male w/pmh of CAD, HTN, lumbar stenosis, HLD who initially presented with SOB, was found to be Influenza A + and later on day of admission developed mild R facial droop, R arm drift and hand grip weakness along with R leg weakness. Code stroke was initiated and received TPA.   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 RICA was also notably stenosed. CUS revealed RICA w/60-79 % stenosis, LICA with 80-99 % stenosis  Echo: EF 55-60%, LA normal in size  Stroke labs: LDL 55, Hemoglobin A1C 6.2 Stroke etiology is likely large vessel atherosclerosis  DAPT for secondary stroke prevention with Aspirin  81 mg + Plavix 75 mg QD  Therapy recommendations: pending   Disposition: TBD  At discharge will place ambulatory referral to neurology for stroke follow up   LICA stenosis Neuro IR consulted for LICA  stenosis on 6/13 for intervention Currently neuro IR recommend outpt follow up for left ICA stenting consideration However, if pt neuro improves well, will call them back to revisit inpt procedure   CARDS  NSTEMI  Cardiomyopathy history Elevated troponin 5,741 -> 24.00 Cardiology consulted for New ST depression in V6 and worsening T wave inversion in inferior leads; likely demand ischemia in the setting of known coronary artery disease (18 stents) and worsening hypoxia and acidosis. He underwent a cardiac cath on 12/08/20 that showed multivessel disease; recommending medical treatment for now and state that distal lesions in RCA can be stented if patient has ischemia and has improved physical condition from a neurological standpoint.   Essential Hypertension (History) Home medication: HCTZ 25 QD, Atenolol 100 mg QD, Lisinopril 20 mg QD at home SBP goal 90-140/50-85, currently pressures are trending 140-160 Continue Metoprolol 5mg  Q 6hr  Hyperlipidemia LDL goal is < 70. LDL is 55 and is at goal.  Atorvastatin 80 mg and Zetia 10mg  daily  RESPIRATORY  Acute Respiratory Failure  Influenza Pneumonia  Left Pleural Effusion  Prior to admission he had SOB and cough. Noted to be hypoxic in the ED. Was found to be Influenza A positive Intubated on 6/11 for increased WOB and hypoxia -> extubated 6/17 -> close monitoring CXR bilateral infiltrates and pulmonary edema Albuterol nebs prn, Brovana 8/11 bid, Duonebs CCM on board  RENAL  Acute Kidney Injury ( Resolved)  Creatinine previously elevated at 1.26, AKI now resolved  NS infusion CCM on board  ENDO  Diabetes Hemoglobin A1C 6.2, goal <7 SSI CBGs Still has hyperglycemia - will consider meal coverage with TF  GI/GU  Dysphagia  Dysphagia in the setting of L MCA stroke  SLP assessed him on 6/10, noted dysphagia and recommended NPO.  Extubated 6/17 -> needs cortrak with TF  Urinary retention Foley placement Voiding trial post  extubation   HEME  DVT Prophylaxis  SCDs, Heparin infusion  ID  Influenza A Pneumonia  Screened for influenza given recent travel to 8/10 and ongoing SOB prior to admission. Noted to be febrile and hypoxic this admission. Tamiflu 75 BID for 5 days- 12/03/20 to 12/07/20 Treated with Azithromycin 6/11 to 6/14  Ceftriaxone 2 g Q24 hr started 6/11-6/15  Fever  Trending CBC, so far no leukocytosis. Continues to have intermittent fever, but afebrile for the last 24h.  Increased sputum- culture w/moderate WBC few gram positive and negative rods  CXR and UA ordered 6/14 as a part of fever work up. 6/14 CXR with slight increase in the degree of left basilar consolidation with associated effusion. 6/14 UA negative for UTI.     Hospital day # 8  This patient is critically ill due to respiratory failure, left MCA infarct with left ICA high grade stenosis, non STEMI, multivessel CAD, fever, flu and at significant risk of neurological worsening, death form recurrernt stroke, sepsis, seizure, heart failure. This patient's care requires constant monitoring of vital signs, hemodynamics, respiratory and cardiac monitoring, review of multiple databases, neurological assessment, discussion with family, other specialists and medical decision making of high complexity. I spent 40 minutes of neurocritical care time in the care of this patient. Discussed with CCM NP   7/14, MD PhD Stroke Neurology 12/11/2020 12:39 PM  To contact Stroke Continuity provider, please refer to [email or web address] After hours, contact General Neurology

## 2020-12-11 NOTE — Progress Notes (Signed)
Physical Therapy Treatment Patient Details Name: Brandon Daniel MRN: [id number] DOB: 01-24-1951 Today's Date: 12/11/2020    History of Present Illness [name] is a 70 y.o. male admitted 6/9 that  presented with SOB, was found to be Influenza A + and later on day of admission developed mild R facial droop, R arm drift and hand grip weakness along with R leg weakness. Left MCA and ACA CVA. Pt received tPa. VDRF 6/11-6/15. PMH: CAD, HTN, lumbar stenosis, HLD    PT Comments    Pt admitted with above diagnosis. Pt was able to sit EOB for 18 min with +2 max to min assist and follow some commands.  Still do not have movement of right hemibody however pt is making some progress. Pt did wash his face to command.  Pt on 5L on arrival and had to incr to 6L to keep pt >90%.  Nurse made aware.  Pt currently with functional limitations due to balance and endurance deficits. Pt will benefit from skilled PT to increase their independence and safety with mobility to allow discharge to the venue listed below.      Follow Up Recommendations  CIR     Equipment Recommendations  Other (comment) (TBA)    Recommendations for Other Services Rehab consult     Precautions / Restrictions Precautions Precautions: Fall Restrictions Weight Bearing Restrictions: No    Mobility  Bed Mobility Overal bed mobility: Needs Assistance Bed Mobility: Rolling;Sidelying to Sit;Sit to Sidelying Rolling: Max assist Sidelying to sit: Total assist     Sit to sidelying: Max assist General bed mobility comments: Pt able to assist minimally with rolling Lt and Rt, he required assist to move LEs off the EOB and to lift trunk.  He was able to assist minmally with return to supine and attempted to assist with scooting toward the EOB    Transfers Overall transfer level: Needs assistance Equipment used: 2 person hand held assist Transfers: Sit to/from Stand Sit to Stand: Total assist;+2 physical assistance          General transfer comment: Pt unable to assist but did take some weight on bil LEs with therapists lifting pt with pad.  Ambulation/Gait                 Stairs             Wheelchair Mobility    Modified Rankin (Stroke Patients Only) Modified Rankin (Stroke Patients Only) Pre-Morbid Rankin Score: No symptoms Modified Rankin: Severe disability     Balance Overall balance assessment: Needs assistance Sitting-balance support: Single extremity supported;No upper extremity supported;Feet supported Sitting balance-Leahy Scale: Poor Sitting balance - Comments: Pt sat a total of 18 minutes at EOB with varying assist from max to min guard at times (min guard for brief periods with pt unable to sustain on his own for more than seconds).  Pt losing balance all directions. holding his head to left. Postural control: Posterior lean;Right lateral lean;Left lateral lean                                  Cognition Arousal/Alertness: Lethargic;Awake/alert Behavior During Therapy: Flat affect Overall Cognitive Status: Impaired/Different from baseline Area of Impairment: Attention;Following commands;Problem solving                   Current Attention Level: Focused   Following Commands: Follows one step commands inconsistently;Follows one step commands with  increased time     Problem Solving: Slow processing;Decreased initiation;Difficulty sequencing;Requires verbal cues;Requires tactile cues General Comments: Pt lethargic, but does arouse once EOB demonsrating periods of eye opening.  He looked to therapist on Lt on command, washed face on command, and was able to assist with rolling on command.  He does demonstrate a delay in processing and is slow to initiate activity      Exercises General Exercises - Lower Extremity Heel Slides: AROM;Left;Supine;5 reps (P/ROM right)    General Comments General comments (skin integrity, edema, etc.): Sp02 87% on 5L  supplemental 02 with 02 increased to 6L with sp02 91%      Pertinent Vitals/Pain Pain Assessment: Faces Faces Pain Scale: No hurt    Home Living Family/patient expects to be discharged to:: Private residence Living Arrangements: Alone Available Help at Discharge: Available 24 hours/day Type of Home: House Home Access: Stairs to enter;Ramped entrance   Home Layout: One level Home Equipment: Cane - single point      Prior Function Level of Independence: Independent      Comments: DRiving, took care of himself, yardwork, plants, chickens   PT Goals (current goals can now be found in the care plan section) Acute Rehab PT Goals Patient Stated Goal: to get better Progress towards PT goals: Progressing toward goals    Frequency    Min 4X/week      PT Plan Current plan remains appropriate    Co-evaluation PT/OT/SLP Co-Evaluation/Treatment: Yes Reason for Co-Treatment: Complexity of the patient's impairments (multi-system involvement);For patient/therapist safety PT goals addressed during session: Mobility/safety with mobility OT goals addressed during session: ADL's and self-care;Strengthening/ROM      AM-PAC PT "6 Clicks" Mobility   Outcome Measure  Help needed turning from your back to your side while in a flat bed without using bedrails?: Total Help needed moving from lying on your back to sitting on the side of a flat bed without using bedrails?: Total Help needed moving to and from a bed to a chair (including a wheelchair)?: Total Help needed standing up from a chair using your arms (e.g., wheelchair or bedside chair)?: Total Help needed to walk in hospital room?: Total Help needed climbing 3-5 steps with a railing? : Total 6 Click Score: 6    End of Session   Activity Tolerance: Patient limited by fatigue Patient left: in bed;with call bell/phone within reach;with bed alarm set Nurse Communication: Mobility status;Need for lift equipment PT Visit Diagnosis:  Muscle weakness (generalized) (M62.81);Hemiplegia and hemiparesis Hemiplegia - Right/Left: Right Hemiplegia - dominant/non-dominant: Dominant Hemiplegia - caused by: Cerebral infarction     Time: 1330-1407 PT Time Calculation (min) (ACUTE ONLY): 37 min  Charges:  $Therapeutic Activity: 8-22 mins                     Katasha Riga M,PT Acute Rehab Services [phone] (pager)    Bevelyn Buckles 12/11/2020, 4:09 PM

## 2020-12-11 NOTE — Procedures (Signed)
Extubation Procedure Note  Patient Details:   Name: Brandon Daniel DOB: 12/17/1950 MRN: [id number]   Airway Documentation:    Vent end date: 12/11/20 Vent end time: 1035   Evaluation  O2 sats: stable throughout Complications: No apparent complications Patient did tolerate procedure well. Bilateral Breath Sounds: Clear, Diminished   No pt unable to speak at this time due to stroke.   Pt extubated to 5L [place]. Positive cuff leak noted, not stridor heard at this time. Vitals stable throughout. RT will continue to monitor.   Memory Argue 12/11/2020, 10:41 AM

## 2020-12-11 NOTE — Progress Notes (Signed)
NAME:  Giani Betzold, MRN:  [id number], DOB:  12-27-1950, LOS: 8 ADMISSION DATE:  12/03/2020, CONSULTATION DATE:  12/04/20 REFERRING MD:  Derry Lory, CHIEF COMPLAINT:  weakness   History of Present Illness:  70 year old man with hx of CAD, HTN, HLD admitted for SOB from influenza A on 12/03/20.  Developed R face and R sided weakness this evening and received TPA after normal head CT.  After this he developed some worsening responsiveness so another CT ordered which looks fine. PCCM consulted to monitor airway.  Pertinent  Medical History  Asthma/COPD overlad CAD HLD HTN  Significant Hospital Events: Including procedures, antibiotic start and stop dates in addition to other pertinent events   6/9 admitted 6/9 evening code stroke, tpa, PCCM eval 6/10 Intubated for resp distress 6/11 Lt IJ CVL placed, levo for hypotension, started heparin drip for NSTEMI and cardiology consulted 6/14 Left Heart Cath  6/15  started lasix to help with vent liberation  Interim History / Subjective:  Awake, following commands, remains on the ventilator. Right hemiparesis present.   No events overnight reported.  Patient was afebrile.  Will attempt further wean today.  IR and neuro would like extubation prior to intervention.  Objective   Blood pressure (!) 147/75, pulse 86, temperature 98.8 F (37.1 C), temperature source Axillary, resp. rate 19, height 5' 9.02" (1.753 m), weight 100.8 kg, SpO2 94 %.    Vent Mode: PSV;CPAP FiO2 (%):  [40 %] 40 % Set Rate:  [18 bmp] 18 bmp Vt Set:  [490 mL] 490 mL PEEP:  [5 cmH20] 5 cmH20 Pressure Support:  [8 cmH20] 8 cmH20 Plateau Pressure:  [18 cmH20-19 cmH20] 19 cmH20   Intake/Output Summary (Last 24 hours) at 12/11/2020 0833 Last data filed at 12/11/2020 0700 Gross per 24 hour  Intake 1743.51 ml  Output 3077 ml  Net -1333.49 ml   Filed Weights   12/04/20 0500 12/10/20 0500 12/11/20 0500  Weight: 100.8 kg 100.9 kg 100.8 kg    Examination: Gen:      No  acute distress on vent`. Patient alert in bed HEENT: ncat,sclera anicteric, ETT Neck:     No masses; no thyromegaly Lungs:   On Vent; improving minimal bilateral rhonchi  CV:         Regular rate and rhythm; no murmurs Abd:      + bowel sounds; soft, non-tender; no palpable masses, no distension Ext:    Mild le edema; adequate peripheral perfusion Skin:      Warm and dry; no rash Neuro: Right hemiparesis, follows commands.  Appropriate accommodation in both eyes.  Labs/imaging that I havepersonally reviewed  (right click and "Reselect all SmartList Selections" daily)  Na 141 Potassium 3.8 creatinine 0.71 WBC 6.9 hemoglobin 9.4  Resolved Hospital Problem list   N/a  Assessment & Plan:  Acute respiratory failure due to inability to clear secretions, acute pulmonary edema, Influenza A pna Fever, improved Follow chest x-ray prn vent continuously  Ceftriaxone Complete 6/15  Azithromycin Complete 6/14 Tracheal aspirate culture: normal flora Completed tamiflu influenza pneumonia Re-dose lasix, still 1.2 L positive over hospital stay  Will attempt to wean patient from ventilator.  If patient tolerates weaning, possible extubation.   Left ACA, MCA stroke s/p tPA Management per neurology Will need carotid endarterectomy for stenosis when stable Neuro IR, possible diagnostic cerebral arteriogram once extubated  Non ST elevation MI, history of cardiomyopathy, acute pulmonary edema Marked elevation in troponins noted.  He has extensive cardiac history Cardiology following  Heparin was stopped 6/14 after LHC and placed on asa and plavix -cont subcut lovenox Continue  Metoprolol  Continue Lipitor, Zetia  Added Losartan  -LHC revealed restenosed grafts and multivessel disease  Low hemoglobin.  No evidence of active bleed Likely secondary to critical illness Follow CBC Stable  Hgb 9.4 no transfusion needed at this time.  Consider transfusion if <7 or patient presentation  worsens  AKI, improved  Supportive care, monitor urine output and creatinine Electrolyte replacement per protocol  -dosing lasix to help with vent liberation but close monitoring of indices  Left pleural effusion -noted  Prediabetes  HbA1C 6.2 Added Lantis 10 units.   SSI   Nutrition Tube feeds Cortrak will be placed today   Best practice (right click and "Reselect all SmartList Selections" daily)  Diet:  Tube Feed  Pain/Anxiety/Delirium protocol (if indicated): Yes (RASS goal -1) VAP protocol (if indicated): Yes DVT prophylaxis: LMWH GI prophylaxis: PPI Glucose control:  SSI No Central venous access:  Yes, and it is no longer needed Arterial line:  N/A Foley:  Yes, and it is still needed Mobility:  bed rest  PT consulted: N/A Last date of multidisciplinary goals of care discussion [6/14] Code Status:  full code Disposition: ICU   Dennie Bible MS4

## 2020-12-11 NOTE — Progress Notes (Signed)
Inpatient Diabetes Program Recommendations  AACE/ADA: New Consensus Statement on Inpatient Glycemic Control (2015)  Target Ranges:  Prepandial:   less than 140 mg/dL      Peak postprandial:   less than 180 mg/dL (1-2 hours)      Critically ill patients:  140 - 180 mg/dL   Lab Results  Component Value Date   GLUCAP 195 (H) 12/11/2020   HGBA1C 6.2 (H) 12/04/2020    Review of Glycemic Control Results for JOCSAN, MCGINLEY (MRN [id number]) as of 12/11/2020 09:23  Ref. Range 12/10/2020 19:30 12/10/2020 23:31 12/11/2020 04:13 12/11/2020 07:36  Glucose-Capillary Latest Ref Range: 70 - 99 mg/dL 741 (H) 287 (H) 867 (H) 195 (H)   Diabetes history: PreDM Outpatient Diabetes medications: none Current orders for Inpatient glycemic control: Novolog 0-15 units Q4H Vital @ 60 ml/hr  Inpatient Diabetes Program Recommendations:    Consider adding Novolog 3 units Q4H for tube feed coverage (to be stopped or held in the event tube feeds are stopped).   Thanks, Lujean Rave, MSN, RNC-OB Diabetes Coordinator [phone] (8a-5p)

## 2020-12-11 NOTE — Progress Notes (Signed)
Patient planned for diagnostic cerebral angiogram with Dr Tommi Rumps Melchor Amour to assess CTA findings of bilateral vertebral artery stenoses pending extubation and overall stability. Given patient remains intubated with continued efforts at weaning we will plan for patient to return to [facility] as an outpatient for a diagnostic cerebral angiogram. If the patient develops new or worsening neurologic symptoms please page NIR to discuss emergent angiogram.  Above was discussed with patient's Kandis Mannan today via phone who states understanding.  Order has been placed for outpatient procedure, NIR schedulers will call to set this up after discharge. I have placed Dr Tommi Rumps Melchor Amour' contact information in his AVS should he have any questions prior to his appointment.   NIR remains available as needed, please call with any questions or concerns.  Lynnette Caffey, PA-C

## 2020-12-11 NOTE — Evaluation (Signed)
Occupational Therapy Evaluation Patient Details Name: Brandon Daniel MRN: [id number] DOB: 04-06-1951 Today's Date: 12/11/2020    History of Present Illness [name] is a 70 y.o. male admitted 6/9 that  presented with SOB, was found to be Influenza A + and later on day of admission developed mild R facial droop, R arm drift and hand grip weakness along with R leg weakness. Left MCA and ACA CVA. Pt received tPa. VDRF 6/11-6/15. PMH: CAD, HTN, lumbar stenosis, HLD   Clinical Impression   Pt admitted with above. He demonstrates the below listed deficits and will benefit from continued OT to maximize safety and independence with BADLs.  Pt presents to OT with Rt hemiplegia, impaired cognition, decreased balance, decreased activity tolerance.  He currently requires total A for ADLs, and max - total A for functional mobility.  PTA, pt was fully independent with ADLs.   Feel he would benefit from CIR level rehab.      Follow Up Recommendations  CIR    Equipment Recommendations  None recommended by OT    Recommendations for Other Services Rehab consult     Precautions / Restrictions Precautions Precautions: Fall Restrictions Weight Bearing Restrictions: No      Mobility Bed Mobility Overal bed mobility: Needs Assistance Bed Mobility: Rolling;Sidelying to Sit;Sit to Sidelying Rolling: Max assist Sidelying to sit: Total assist     Sit to sidelying: Max assist General bed mobility comments: Pt able to assist minimally with rolling Lt and Rt, he required assist to move LEs off the EOB and to lift trunk.  He was able to assist minmally with return to supine and attempted to assist with scooting toward the EOB    Transfers Overall transfer level: Needs assistance Equipment used: 2 person hand held assist Transfers: Sit to/from Stand Sit to Stand: Total assist;+2 physical assistance         General transfer comment: Pt unable to assist but did take some weight on bil LEs  with therapists lifting pt with pad.    Balance Overall balance assessment: Needs assistance Sitting-balance support: Single extremity supported;No upper extremity supported;Feet supported Sitting balance-Leahy Scale: Poor Sitting balance - Comments: Pt sat a total of 18 minutes at EOB with varying assist from max to min guard at times (min guard for brief periods with pt unable to sustain on his own for more than seconds).  Pt losing balance all directions. holding his head to left. Postural control: Posterior lean;Right lateral lean;Left lateral lean                                 ADL either performed or assessed with clinical judgement   ADL Overall ADL's : Needs assistance/impaired Eating/Feeding: NPO   Grooming: Wash/dry face;Moderate assistance;Sitting Grooming Details (indicate cue type and reason): pt able to wash face on command while sitting EOB with mod A Upper Body Bathing: Total assistance;Sitting   Lower Body Bathing: Total assistance;Bed level   Upper Body Dressing : Total assistance;Sitting   Lower Body Dressing: Total assistance;Bed level   Toilet Transfer: Total assistance Toilet Transfer Details (indicate cue type and reason): Pt unable Toileting- Clothing Manipulation and Hygiene: Total assistance;Bed level       Functional mobility during ADLs: Total assistance General ADL Comments: pt unable to maintain arousal fully to perform ADLs     Vision   Additional Comments: pt unable to provide info     Perception Perception  Comments: Pt appears to have a mild Lt gaze preference   Praxis Praxis Praxis tested?: Not tested;Deficits Deficits: Initiation Praxis-Other Comments: unable to accurately assess    Pertinent Vitals/Pain Pain Assessment: Faces Faces Pain Scale: No hurt     Hand Dominance Right   Extremity/Trunk Assessment Upper Extremity Assessment Upper Extremity Assessment: RUE deficits/detail RUE Deficits / Details: no  volitional movement noted.  PROM grossly WFL.  Brunnstrom  stage II - associated reactions noted RUE Coordination: decreased gross motor;decreased fine motor   Lower Extremity Assessment Lower Extremity Assessment: Defer to PT evaluation   Cervical / Trunk Assessment Cervical / Trunk Assessment: Other exceptions Cervical / Trunk Exceptions: decreased trunk control with Rt trunk weakness.  Pt maintains posterior pelvic tilt with thoracic flexion   Communication Communication Communication: Prefers language other than English;Other (comment) (pt's native language is Arabic, but does understand [place])   Cognition Arousal/Alertness: Lethargic;Awake/alert Behavior During Therapy: Flat affect Overall Cognitive Status: Impaired/Different from baseline Area of Impairment: Attention;Following commands;Problem solving                   Current Attention Level: Focused   Following Commands: Follows one step commands inconsistently;Follows one step commands with increased time     Problem Solving: Slow processing;Decreased initiation;Difficulty sequencing;Requires verbal cues;Requires tactile cues General Comments: Pt lethargic, but does arouse once EOB demonsrating periods of eye opening.  He looked to therapist on Lt on command, washed face on command, and was able to assist with rolling on command.  He does demonstrate a delay in processing and is slow to initiate activity   General Comments  Sp02 87% on 5L supplemental 02 with 02 increased to 6L with sp02 91%    Exercises Exercises: General Upper Extremity;General Lower Extremity General Exercises - Lower Extremity Heel Slides: AROM;Left;Supine;5 reps (P/ROM right)   Shoulder Instructions      Home Living Family/patient expects to be discharged to:: Private residence Living Arrangements: Alone Available Help at Discharge: Available 24 hours/day Type of Home: House Home Access: Stairs to enter;Ramped entrance Entrance  Stairs-Number of Steps: 3   Home Layout: One level               Home Equipment: Cane - single point          Prior Functioning/Environment Level of Independence: Independent        Comments: DRiving, took care of himself, yardwork, plants, chickens        OT Problem List: Decreased strength;Decreased range of motion;Decreased activity tolerance;Impaired balance (sitting and/or standing);Impaired vision/perception;Decreased coordination;Decreased cognition;Decreased safety awareness;Cardiopulmonary status limiting activity;Impaired UE functional use      OT Treatment/Interventions: Self-care/ADL training;Neuromuscular education;DME and/or AE instruction;Therapeutic activities;Cognitive remediation/compensation;Visual/perceptual remediation/compensation;Patient/family education;Balance training    OT Goals(Current goals can be found in the care plan section) Acute Rehab OT Goals Patient Stated Goal: to get better OT Goal Formulation: With patient Time For Goal Achievement: 12/25/20 Potential to Achieve Goals: Good ADL Goals Pt Will Perform Grooming: with min assist;sitting Pt Will Transfer to Toilet: with max assist;squat pivot transfer;bedside commode Additional ADL Goal #1: Pt will maintain EOB sitting with minA while performing basic ADL task x 10 mins Additional ADL Goal #2: Pt will locate ADL items on Rt with min cues Additional ADL Goal #3: Pt will sustain attention to simple ADL task x 5 mins with min cues.  OT Frequency: Min 2X/week   Barriers to D/C:            Co-evaluation PT/OT/SLP Co-Evaluation/Treatment:  Yes Reason for Co-Treatment: Complexity of the patient's impairments (multi-system involvement);For patient/therapist safety PT goals addressed during session: Mobility/safety with mobility OT goals addressed during session: ADL's and self-care;Strengthening/ROM      AM-PAC OT "6 Clicks" Daily Activity     Outcome Measure Help from another person  eating meals?: Total Help from another person taking care of personal grooming?: A Lot Help from another person toileting, which includes using toliet, bedpan, or urinal?: Total Help from another person bathing (including washing, rinsing, drying)?: Total Help from another person to put on and taking off regular upper body clothing?: Total Help from another person to put on and taking off regular lower body clothing?: Total 6 Click Score: 7   End of Session Nurse Communication: Mobility status  Activity Tolerance: Patient tolerated treatment well Patient left: in bed;with bed alarm set;with call bell/phone within reach  OT Visit Diagnosis: Unsteadiness on feet (R26.81);Hemiplegia and hemiparesis;Cognitive communication deficit (R41.841) Symptoms and signs involving cognitive functions: Cerebral infarction Hemiplegia - Right/Left: Right Hemiplegia - dominant/non-dominant: Dominant Hemiplegia - caused by: Cerebral infarction                Time: 8250-5397 OT Time Calculation (min): 36 min Charges:  OT General Charges $OT Visit: 1 Visit OT Evaluation $OT Eval Moderate Complexity: 1 Mod  [name]., OTR/L Acute Rehabilitation Services Pager [phone] Office [phone]   [name] M 12/11/2020, 5:04 PM

## 2020-12-11 NOTE — Consult Note (Signed)
Ref: Pcp, No   Subjective:  Awake. Remains intubated. Mildly sedated. Opens eyes and follows. Nodes head inresponse to question. VS stable.  Objective:  Vital Signs in the last 24 hours: Temp:  [97.7 F (36.5 C)-99.8 F (37.7 C)] 98.8 F (37.1 C) (06/17 0700) Pulse Rate:  [74-99] 86 (06/17 0800) Cardiac Rhythm: Normal sinus rhythm;Sinus tachycardia (06/17 0400) Resp:  [18-29] 19 (06/17 0800) BP: (92-175)/(54-86) 147/75 (06/17 0800) SpO2:  [88 %-96 %] 94 % (06/17 0800) FiO2 (%):  [40 %] 40 % (06/17 0800) Weight:  [100.8 kg] 100.8 kg (06/17 0500)  Physical Exam: BP Readings from Last 1 Encounters:  12/11/20 (!) 147/75     Wt Readings from Last 1 Encounters:  12/11/20 100.8 kg    Weight change: -0.1 kg Body mass index is 32.8 kg/m. HEENT: [place]/AT, Eyes-Blue, Conjunctiva-Pale, Sclera-Non-icteric Neck: No JVD, No bruit, Trachea midline. Lungs:  Clearing, Bilateral. Cardiac:  Regular rhythm, normal S1 and S2, no S3. II/VI systolic murmur. Abdomen:  Soft, non-tender. BS present. Extremities:  No edema present. No cyanosis. No clubbing. CNS: AxOx1, Cranial nerves grossly intact, moves left sided extremities.  Skin: Warm and dry.   Intake/Output from previous day: 06/16 0701 - 06/17 0700 In: 1911.4 [I.V.:411.4; NG/GT:1500] Out: 3077 [Urine:3075; Stool:2]    Lab Results: BMET    Component Value Date/Time   NA 141 12/11/2020 0137   NA 142 12/10/2020 0636   NA 141 12/10/2020 0138   K 3.8 12/11/2020 0137   K 3.8 12/10/2020 0636   K 4.0 12/10/2020 0138   CL 101 12/11/2020 0137   CL 103 12/10/2020 0636   CL 103 12/10/2020 0138   CO2 31 12/11/2020 0137   CO2 30 12/10/2020 0636   CO2 29 12/10/2020 0138   GLUCOSE 238 (H) 12/11/2020 0137   GLUCOSE 173 (H) 12/10/2020 0636   GLUCOSE 204 (H) 12/10/2020 0138   BUN 27 (H) 12/11/2020 0137   BUN 27 (H) 12/10/2020 0636   BUN 27 (H) 12/10/2020 0138   CREATININE 0.71 12/11/2020 0137   CREATININE 0.74 12/10/2020 0636    CREATININE 0.76 12/10/2020 0138   CALCIUM 8.6 (L) 12/11/2020 0137   CALCIUM 8.3 (L) 12/10/2020 0636   CALCIUM 8.2 (L) 12/10/2020 0138   GFRNONAA >60 12/11/2020 0137   GFRNONAA >60 12/10/2020 0636   GFRNONAA >60 12/10/2020 0138   CBC    Component Value Date/Time   WBC 6.9 12/11/2020 0137   RBC 3.25 (L) 12/11/2020 0137   HGB 9.4 (L) 12/11/2020 0137   HCT 30.5 (L) 12/11/2020 0137   PLT 234 12/11/2020 0137   MCV 93.8 12/11/2020 0137   MCH 28.9 12/11/2020 0137   MCHC 30.8 12/11/2020 0137   RDW 14.7 12/11/2020 0137   LYMPHSABS 0.5 (L) 12/06/2020 1300   MONOABS 0.6 12/06/2020 1300   EOSABS 0.0 12/06/2020 1300   BASOSABS 0.0 12/06/2020 1300   HEPATIC Function Panel No results for input(s): PROT in the last 8760 hours.  Invalid input(s):  ALBUMIN,  AST,  ALT,  ALKPHOS,  BILIDIR,  IBILI HEMOGLOBIN A1C No components found for: HGA1C,  MPG CARDIAC ENZYMES No results found for: CKTOTAL, CKMB, CKMBINDEX, TROPONINI BNP No results for input(s): PROBNP in the last 8760 hours. TSH No results for input(s): TSH in the last 8760 hours. CHOLESTEROL Recent Labs    12/04/20 0219  CHOL 108    Scheduled Meds:  arformoterol  15 mcg Nebulization BID   aspirin  81 mg Per Tube Daily   atorvastatin  80 mg Per Tube Daily   chlorhexidine gluconate (MEDLINE KIT)  15 mL Mouth Rinse BID   Chlorhexidine Gluconate Cloth  6 each Topical Daily   clopidogrel  75 mg Per Tube Daily   enoxaparin (LOVENOX) injection  50 mg Subcutaneous Q24H   ezetimibe  10 mg Per Tube Daily   feeding supplement (PROSource TF)  45 mL Per Tube TID   gabapentin  600 mg Per Tube BID   insulin aspart  0-15 Units Subcutaneous Q4H   ipratropium-albuterol  3 mL Nebulization BID   mouth rinse  15 mL Mouth Rinse 10 times per day   metoprolol tartrate  25 mg Per Tube BID   montelukast  10 mg Per Tube QHS   pantoprazole (PROTONIX) IV  40 mg Intravenous QHS   polyethylene glycol  17 g Per Tube Daily   sodium chloride flush   10-40 mL Intracatheter Q12H   sodium chloride flush  3 mL Intravenous Q12H   Continuous Infusions:  sodium chloride Stopped (12/10/20 1208)   sodium chloride     feeding supplement (VITAL 1.5 CAL) 1,000 mL (12/11/20 0423)   fentaNYL infusion INTRAVENOUS 150 mcg/hr (12/11/20 0700)   PRN Meds:.sodium chloride, acetaminophen, albuterol, fentaNYL, guaiFENesin-dextromethorphan, midazolam, nitroGLYCERIN, ondansetron (ZOFRAN) IV, senna-docusate, sodium chloride flush, sodium chloride flush  Assessment/Plan:  Acute NSTEMI 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 Continue Aspirin, Plavix, Atorvastatin, metoprolol. Add small dose Losartan. Use Lasix prn for vascular congestion. Dr. Terrence Dupont covering over weekend.   LOS: 8 days   Time spent including chart review, lab review, examination, discussion with patient/Nurse : 30 min   Dixie Dials  MD  12/11/2020, 8:21 AM Pager/mobile : [phone]

## 2020-12-11 NOTE — Procedures (Signed)
Cortrak  Person Inserting Tube:  Osa Craver, RD Tube Type:  Cortrak - 43 inches Tube Size:  10 Tube Location:  Right nare Initial Placement:  Stomach Secured by: Bridle Technique Used to Measure Tube Placement:  Marking at nare/corner of mouth Cortrak Secured At:  72 cm  Cortrak Tube Team Note:  Consult received to place a Cortrak feeding tube.   X-ray is required, abdominal x-ray has been ordered by the Cortrak team. Please confirm tube placement before using the Cortrak tube.   If the tube becomes dislodged please keep the tube and contact the Cortrak team at [email or web address] ([email or web address]) for replacement.  If after hours and replacement cannot be delayed, place a NG tube and confirm placement with an abdominal x-ray.    Romelle Starcher MS, RDN, LDN, CNSC Registered Dietitian III Clinical Nutrition RD Pager and On-Call Pager Number Located in [place]

## 2020-12-12 DIAGNOSIS — J101 Influenza due to other identified influenza virus with other respiratory manifestations: Secondary | ICD-10-CM | POA: Diagnosis not present

## 2020-12-12 LAB — URINALYSIS, ROUTINE W REFLEX MICROSCOPIC
Bilirubin Urine: NEGATIVE
Glucose, UA: 150 mg/dL — AB
Hgb urine dipstick: NEGATIVE
Ketones, ur: NEGATIVE mg/dL
Leukocytes,Ua: NEGATIVE
Nitrite: NEGATIVE
Protein, ur: NEGATIVE mg/dL
Specific Gravity, Urine: 1.019 (ref 1.005–1.030)
pH: 7 (ref 5.0–8.0)

## 2020-12-12 LAB — CBC
HCT: 31.9 % — ABNORMAL LOW (ref 39.0–52.0)
Hemoglobin: 9.8 g/dL — ABNORMAL LOW (ref 13.0–17.0)
MCH: 28.7 pg (ref 26.0–34.0)
MCHC: 30.7 g/dL (ref 30.0–36.0)
MCV: 93.5 fL (ref 80.0–100.0)
Platelets: 287 10*3/uL (ref 150–400)
RBC: 3.41 MIL/uL — ABNORMAL LOW (ref 4.22–5.81)
RDW: 14.7 % (ref 11.5–15.5)
WBC: 7.4 10*3/uL (ref 4.0–10.5)
nRBC: 0 % (ref 0.0–0.2)

## 2020-12-12 LAB — BASIC METABOLIC PANEL
Anion gap: 7 (ref 5–15)
BUN: 29 mg/dL — ABNORMAL HIGH (ref 8–23)
CO2: 31 mmol/L (ref 22–32)
Calcium: 8.5 mg/dL — ABNORMAL LOW (ref 8.9–10.3)
Chloride: 104 mmol/L (ref 98–111)
Creatinine, Ser: 0.69 mg/dL (ref 0.61–1.24)
GFR, Estimated: >60 mL/min (ref >60)
Glucose, Bld: 230 mg/dL — ABNORMAL HIGH (ref 70–99)
Potassium: 3.9 mmol/L (ref 3.5–5.1)
Sodium: 142 mmol/L (ref 135–145)

## 2020-12-12 LAB — GLUCOSE, CAPILLARY
Glucose-Capillary: 210 mg/dL — ABNORMAL HIGH (ref 70–99)
Glucose-Capillary: 236 mg/dL — ABNORMAL HIGH (ref 70–99)
Glucose-Capillary: 218 mg/dL — ABNORMAL HIGH (ref 70–99)
Glucose-Capillary: 212 mg/dL — ABNORMAL HIGH (ref 70–99)

## 2020-12-12 MED ORDER — INSULIN GLARGINE 100 UNIT/ML ~~LOC~~ SOLN
15.0000 [IU] | Freq: Every day | SUBCUTANEOUS | Status: DC
  Administered 2020-12-12: 15 [IU] via SUBCUTANEOUS
  Filled 2020-12-12 (×2): qty 0.15

## 2020-12-12 MED ORDER — IPRATROPIUM-ALBUTEROL 0.5-2.5 (3) MG/3ML IN SOLN
3.0000 mL | Freq: Four times a day (QID) | RESPIRATORY_TRACT | Status: DC
  Administered 2020-12-12 – 2020-12-24 (×46): 3 mL via RESPIRATORY_TRACT
  Filled 2020-12-12 (×48): qty 3

## 2020-12-12 MED ORDER — INSULIN ASPART 100 UNIT/ML IJ SOLN
2.0000 [IU] | INTRAMUSCULAR | Status: DC
  Administered 2020-12-12 – 2020-12-13 (×6): 2 [IU] via SUBCUTANEOUS

## 2020-12-12 MED ORDER — FUROSEMIDE 10 MG/ML IJ SOLN
40.0000 mg | Freq: Once | INTRAMUSCULAR | Status: AC
  Administered 2020-12-12: 40 mg via INTRAVENOUS
  Filled 2020-12-12: qty 4

## 2020-12-12 MED ORDER — BUDESONIDE 0.5 MG/2ML IN SUSP
0.5000 mg | Freq: Two times a day (BID) | RESPIRATORY_TRACT | Status: DC
  Administered 2020-12-12 – 2021-01-12 (×60): 0.5 mg via RESPIRATORY_TRACT
  Filled 2020-12-12 (×62): qty 2

## 2020-12-12 NOTE — Evaluation (Signed)
Speech Language Pathology Evaluation Patient Details Name: Brandon Daniel MRN: [id number] DOB: 07-11-1950 Today's Date: 12/12/2020 Time: 2446-2863 SLP Time Calculation (min) (ACUTE ONLY): 15 min  Problem List:  Patient Active Problem List   Diagnosis Date Noted   NSTEMI (non-ST elevated myocardial infarction) (HCC) 12/03/2020   Influenza A 12/03/2020   CAD (coronary artery disease) 12/03/2020   Asthma 12/03/2020   Essential hypertension 12/03/2020   Hyperlipidemia 12/03/2020   Dyspnea 12/03/2020   Pleural effusion on left 12/03/2020   Stroke (cerebrum) (HCC) 12/03/2020   Lumbosacral disc herniation    Chest pain 10/20/2020   Past Medical History:  Past Medical History:  Diagnosis Date   Asthma-COPD overlap syndrome (HCC)    CAD (coronary artery disease)    HLD (hyperlipidemia)    HTN (hypertension)    Lumbar disc herniation with radiculopathy    Lumbar stenosis    Osteoporosis    Past Surgical History:  Past Surgical History:  Procedure Laterality Date   CATARACT EXTRACTION, BILATERAL  2021   CORONARY ARTERY BYPASS GRAFT  2005   INGUINAL HERNIA REPAIR  1998   LEFT HEART CATH AND CORS/GRAFTS ANGIOGRAPHY N/A 12/08/2020   Procedure: LEFT HEART CATH AND CORS/GRAFTS ANGIOGRAPHY;  Surgeon: Orpah Cobb, MD;  Location: [facility];  Service: Cardiovascular;  Laterality: N/A;   LUMBAR DISC SURGERY  2019   HPI:  Pt is a 70 year old male who was admitted for SOB from influenza A on 6/9 and developed R face and R sided weakness. Pt received tPA after normal head CT and subsequently exhibited worsening responsiveness; repeat CT on that date was still negative for acute changes. CTA: Extensive bulky calcified plaque about the left carotid bulb/proximal left ICA with associated severe near occlusive stenosis. Left ICA, MCA, and ACA markedly attenuated but patent distally. PMH: CAD, HTN, HLD.  Most recent chest xray completed on 6/14 was showing slight increase in the degree of  left basilar consolidation with  associated effusion.  ST has been reconsulted following extubation after an approximately 6 day intubation.   Assessment / Plan / Recommendation Clinical Impression  Assessment of the patient's language skills was completed using portions of the [place] Aphasia Screening Test.  He was non verbal and therefore the expressive portions of the exam were not admisnistered this date.  He was lethargic and had difficulty mainintaing his alertness level.  It was difficult to tell what role this played in his responses.  He presented with a global aphasia.  No verbal output was noted at all.  Receptively he answered simple yes/no questions with 60% accuracy, identified objects in the room with 25% accuracy and inconsistently followed 1 step commands.  ST will follow during acute stay to initate therapy. He would benefit from intense post acute rehab to address deficits.    SLP Assessment  SLP Recommendation/Assessment: Patient needs continued Speech Lanaguage Pathology Services SLP Visit Diagnosis: Aphasia (R47.01)    Follow Up Recommendations  Inpatient Rehab    Frequency and Duration min 2x/week  2 weeks      SLP Evaluation Cognition  Orientation Level: Disoriented X4       Comprehension  Auditory Comprehension Overall Auditory Comprehension: Impaired Yes/No Questions: Impaired Basic Biographical Questions: 51-75% accurate Basic Immediate Environment Questions: 50-74% accurate Commands: Impaired One Step Basic Commands: 50-74% accurate Two Step Basic Commands: 0-24% accurate Reading Comprehension Reading Status: Not tested    Expression Expression Primary Mode of Expression:  (Pt is nonverbal at this time.) Verbal Expression  Overall Verbal Expression: Impaired Initiation: Impaired Automatic Speech:  (No verbal output) Repetition: Impaired Level of Impairment: Word level Naming: Impairment Responsive: 0-25% accurate Written Expression Written  Expression: Not tested   Oral / Motor  Oral Motor/Sensory Function Overall Oral Motor/Sensory Function: Other (comment) (Unable to assess) Motor Speech Overall Motor Speech: Other (comment) (Unable to assess)   GO                   Dimas Aguas, MA, CCC-SLP Acute Rehab SLP [phone]  Fleet Contras 12/12/2020, 12:44 PM

## 2020-12-12 NOTE — Evaluation (Signed)
Clinical/Bedside Swallow Evaluation Patient Details  Name: Brandon Daniel MRN: [id number] Date of Birth: [birth year]  Today's Date: 12/12/2020 Time: SLP Start Time (ACUTE ONLY): 1200 SLP Stop Time (ACUTE ONLY): 1210 SLP Time Calculation (min) (ACUTE ONLY): 10 min  Past Medical History:  Past Medical History:  Diagnosis Date   Asthma-COPD overlap syndrome (HCC)    CAD (coronary artery disease)    HLD (hyperlipidemia)    HTN (hypertension)    Lumbar disc herniation with radiculopathy    Lumbar stenosis    Osteoporosis    Past Surgical History:  Past Surgical History:  Procedure Laterality Date   CATARACT EXTRACTION, BILATERAL  2021   CORONARY ARTERY BYPASS GRAFT  2005   INGUINAL HERNIA REPAIR  1998   LEFT HEART CATH AND CORS/GRAFTS ANGIOGRAPHY N/A 12/08/2020   Procedure: LEFT HEART CATH AND CORS/GRAFTS ANGIOGRAPHY;  Surgeon: Orpah Cobb, MD;  Location: [facility];  Service: Cardiovascular;  Laterality: N/A;   LUMBAR DISC SURGERY  2019   HPI:  Pt is a 70 year old male who was admitted for SOB from influenza A on 6/9 and developed R face and R sided weakness. Pt received tPA after normal head CT and subsequently exhibited worsening responsiveness; repeat CT on that date was still negative for acute changes. CTA: Extensive bulky calcified plaque about the left carotid bulb/proximal left ICA with associated severe near occlusive stenosis. Left ICA, MCA, and ACA markedly attenuated but patent distally. PMH: CAD, HTN, HLD.  Most recent chest xray completed on 6/14 was showing slight increase in the degree of left basilar consolidation with  associated effusion.  ST has been reconsulted following extubation after an approximately 6 day intubation.   Assessment / Plan / Recommendation Clinical Impression  Clinical swallowing evaluation was completed using an ice chip following extubation yesterday after approximately a 6 day intubation.  Patient was previously seen during this  admission with recommendation for him to remain NPO with ST follow up for PO readiness.  This date patient was lethargic but did rouse to verbal and tactile stimuli.  He demonstrated limited ability to follow commands, therefore, cranial nerve exam was unable to be completed.  He presented with an oral dysphagia with little oral manipulation seen when presented with an ice chip.  Material had to be removed from his oral cavity.  Assessment of the pharyngeal phase was unable to be made as he never triggered a pharyngeal swallow.  Suggest he continue to receive nutrition/hydration/meds via already placed cortrak.  ST will follow for PO readiness vs need for instrumental exam. SLP Visit Diagnosis: Dysphagia, unspecified (R13.10)    Aspiration Risk  Severe aspiration risk    Diet Recommendation    NPO  Medication Administration: Via alternative means    Other  Recommendations Oral Care Recommendations: Oral care QID   Follow up Recommendations    Continued ST services at next level of care    Frequency and Duration min 2x/week  2 weeks       Prognosis Prognosis for Safe Diet Advancement: Fair Barriers to Reach Goals: Severity of deficits;Language deficits      Swallow Study   General Date of Onset: 12/03/20 HPI: Pt is a 70 year old male who was admitted for SOB from influenza A on 6/9 and developed R face and R sided weakness. Pt received tPA after normal head CT and subsequently exhibited worsening responsiveness; repeat CT on that date was still negative for acute changes. CTA: Extensive bulky calcified plaque about  the left carotid bulb/proximal left ICA with associated severe near occlusive stenosis. Left ICA, MCA, and ACA markedly attenuated but patent distally. PMH: CAD, HTN, HLD.  Most recent chest xray completed on 6/14 was showing slight increase in the degree of left basilar consolidation with  associated effusion.  ST has been reconsulted following extubation after an approximately  6 day intubation. Type of Study: Bedside Swallow Evaluation Previous Swallow Assessment: 6/10 with rx for NPO with ST follow up.  ST signed off when patient was intubated. Diet Prior to this Study: NG Tube Temperature Spikes Noted: Yes Respiratory Status: Nasal cannula History of Recent Intubation: Yes Length of Intubations (days): 6 days Date extubated: 12/11/20 Behavior/Cognition: Lethargic/Drowsy;Doesn't follow directions;Requires cueing Oral Cavity Assessment: Within Functional Limits Oral Care Completed by SLP: Recent completion by staff Oral Cavity - Dentition: Adequate natural dentition Vision: Impaired for self-feeding Self-Feeding Abilities: Total assist Patient Positioning: Upright in bed;Postural control interferes with function Baseline Vocal Quality: Not observed Volitional Cough: Cognitively unable to elicit Volitional Swallow: Unable to elicit    Oral/Motor/Sensory Function Overall Oral Motor/Sensory Function: Other (comment) (Unable to assess)   Ice Chips Ice chips: Impaired Presentation: Spoon Oral Phase Impairments: Poor awareness of bolus Oral Phase Functional Implications: Other (comment) (No ral manipulation; material was removed.)   Thin Liquid Thin Liquid: Not tested    Nectar Thick Nectar Thick Liquid: Not tested   Honey Thick Honey Thick Liquid: Not tested   Puree Puree: Not tested   Solid     Solid: Not tested     Dimas Aguas, MA, CCC-SLP Acute Rehab SLP [phone]  Fleet Contras 12/12/2020,12:31 PM

## 2020-12-12 NOTE — Consult Note (Signed)
Subjective:  Patient sleepy and groggy, responds to verbal commands by opening his eyes  Objective:  Vital Signs in the last 24 hours: Temp:  [98.6 F (37 C)-100 F (37.8 C)] 100 F (37.8 C) (06/18 1117) Pulse Rate:  [79-101] 90 (06/18 1100) Resp:  [23-36] 33 (06/18 1100) BP: (108-145)/(56-80) 126/63 (06/18 1100) SpO2:  [76 %-94 %] 93 % (06/18 1100) Weight:  [100.5 kg] 100.5 kg (06/18 0500)  Intake/Output from previous day: 06/17 0701 - 06/18 0700 In: 1509.5 [I.V.:29.5; NG/GT:1480] Out: 3275 [Urine:3275] Intake/Output from this shift: Total I/O In: 240 [NG/GT:240] Out: -   Physical Exam: Neck: no adenopathy, no carotid bruit, no JVD, supple, symmetrical, trachea midline, and thyroid not enlarged, symmetric, no tenderness/mass/nodules Lungs: diminished breath sounds base - bilaterally Heart: regular rate and rhythm, S1, S2 normal, no murmur, click, rub or gallop Abdomen: soft, non-tender; bowel sounds normal; no masses,  no organomegaly Extremities: extremities normal, atraumatic, no cyanosis or edema Neurologic: Mental status: alertness: lethargic Right-sided paresis. noted Lab Results: Recent Labs    12/11/20 0137 12/12/20 0203  WBC 6.9 7.4  HGB 9.4* 9.8*  PLT 234 287   Recent Labs    12/10/20 0636 12/11/20 0137  NA 142 141  K 3.8 3.8  CL 103 101  CO2 30 31  GLUCOSE 173* 238*  BUN 27* 27*  CREATININE 0.74 0.71   No results for input(s): TROPONINI in the last 72 hours.  Invalid input(s): CK, MB Hepatic Function Panel No results for input(s): PROT, ALBUMIN, AST, ALT, ALKPHOS, BILITOT, BILIDIR, IBILI in the last 72 hours. No results for input(s): CHOL in the last 72 hours. No results for input(s): PROTIME in the last 72 hours.  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  Probably patent has anterior wall contracting normally.  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 Diabetes mellitus. Hyperlipidemia. History of spinal stenosis. Plan Continue present management per CCM/neurology. May need better visualization of LIMA to LAD, prior to considering PCI to RCA.  In future, once stable and recovered from.  Neuro point of view.  LOS: 9 days    Rinaldo Cloud 12/12/2020, 11:57 AM

## 2020-12-12 NOTE — Progress Notes (Signed)
   NAME:  Brandon Daniel, MRN:  [id number], DOB:  Aug 26, 1950, LOS: 9 ADMISSION DATE:  12/03/2020, CONSULTATION DATE:  12/04/20 REFERRING MD:  Derry Lory, CHIEF COMPLAINT:  weakness   History of Present Illness:  70 year old man with hx of CAD, HTN, HLD admitted for SOB from influenza A on 12/03/20.  Developed R face and R sided weakness this evening and received TPA after normal head CT.  After this he developed some worsening responsiveness so another CT ordered which looks fine. PCCM consulted to monitor airway.  Pertinent  Medical History  Asthma/COPD overlad CAD HLD HTN  Significant Hospital Events: Including procedures, antibiotic start and stop dates in addition to other pertinent events   6/9 admitted 6/9 evening code stroke, tpa, PCCM eval 6/10 Intubated for resp distress 6/11 Lt IJ CVL placed, levo for hypotension, started heparin drip for NSTEMI and cardiology consulted 6/14 Left Heart Cath  6/15  started lasix to help with vent liberation 6/16 diuresed 6/17 extubated, diuresed, 5L O2 needed   Interim History / Subjective:  NAEON. Having urinary retention. Stable on 5L [place]. Adding nebulized medicines. Plan to diurese further pending results of AM labs.   Objective   Blood pressure 139/72, pulse 91, temperature 99.7 F (37.6 C), temperature source Axillary, resp. rate (!) 34, height 5' 9.02" (1.753 m), weight 100.5 kg, SpO2 (!) 89 %.    Pressure Support:  [5 cmH20] 5 cmH20   Intake/Output Summary (Last 24 hours) at 12/12/2020 0857 Last data filed at 12/12/2020 0800 Gross per 24 hour  Intake 1629.5 ml  Output 3275 ml  Net -1645.5 ml    Filed Weights   12/10/20 0500 12/11/20 0500 12/12/20 0500  Weight: 100.9 kg 100.8 kg 100.5 kg    Examination: Gen:      No acute distress on vent`. Patient alert in bed HEENT: ncat,sclera anicteric,  Neck:     No masses; no thyromegaly Lungs:   extubated; improving minimal bilateral rhonchi  CV:         Regular rate and rhythm; no  murmurs Abd:      + bowel sounds; soft, non-tender; no palpable masses, no distension Skin:      Warm and dry; no rash Neuro: Right hemiparesis, follows commands.  Appears to have expressive aphasia. Labs/imaging that I havepersonally reviewed  (right click and "Reselect all SmartList Selections" daily)  BG, cbc  Resolved Hospital Problem list   N/a  Assessment & Plan:  Acute respiratory failure due to inability to clear secretions, acute pulmonary edema, Influenza A pna Fever, improved --5L [place], possible COPD per nephew --Diurese   Left ACA, MCA stroke s/p tPA --statin, ASA, plavix --SLP, OT, PT --TTE ok, no Afib seen, ICA stenosis to be dealt with OP --Vertebral stensois to be dealt with OP  Non ST elevation MI, history of cardiomyopathy ischemic s/p CABG, acute pulmonary edema --LHC revealed restenosed grafts and multivessel disease  Low hemoglobin.  No evidence of active bleed Likely secondary to critical illness Follow CBC  AKI, improved  Supportive care, monitor urine output and creatinine Electrolyte replacement per protocol   Small bilateral pleural effusion -diurese  Prediabetes  HbA1C 6.2 Increase  Lantus 15 units.  SSI   Nutrition Tube feeds via Cortrak  Best practice (right click and "Reselect all SmartList Selections" daily)  Per primary  CRITICAL CARE Pn/a   Karren Burly, MD

## 2020-12-12 NOTE — Progress Notes (Addendum)
STROKE TEAM PROGRESS NOTE   INTERVAL HISTORY No acute events since last neurology visit. Mr. Frangos remains extubated, breathing on room air, occasionally labored but maintaining sats >92% (lowest seen on visit). He still experiences global aphasia, RN noted that he can understand and respond to english but does not speak it. Following commands better than yesterday on my visit which is noted to be a transient improvement per RN.  PERTINENT IMAGING   CT Head WO IV Contrast 10Jun2022 No acute intracranial abnormality. ASPECTS is 10.   MRI Brain WO IV Contrast 10Jun2022 Diffuse gray matter infarction in the left ACA and MCA territories.   Echocardiogram Complete 10Jun2022 Left ventricular ejection fraction, by estimation, is 55 to 60%. The left ventricle has normal function. Left ventricular diastolic parameters are indeterminate. Elevated left ventricular end-diastolic pressure.  Right ventricular systolic function is normal. The right ventricular size is normal. There is normal pulmonary artery systolic pressure. The estimated right ventricular systolic pressure is 11.0 mmHg.  The mitral valve is normal in structure. No evidence of mitral valve regurgitation. No evidence of mitral stenosis.  The aortic valve was not well visualized. Aortic valve regurgitation is not visualized. No aortic stenosis is present.  The inferior vena cava is normal in size with greater than 50% respiratory variability, suggesting right atrial pressure of 3 mmHg.   VAS US Carotid 10Jun2022 Right ICA 60-79% stenosis Left ICA 80-99% stenosis   CTA Head and Neck W WO IV Contrast 09Jun2022 Negative CTA for emergent large vessel occlusion. Extensive bulky calcified plaque about the left carotid bulb/proximal left ICA with associated severe near occlusive stenosis. Left ICA, MCA, and ACA markedly attenuated but remain patent distally. Short-segment 70% atheromatous stenosis at the proximal cervical right ICA. Severe  left and moderate right vertebral artery origin stenoses.  Vitals:   12/12/20 1000 12/12/20 1100 12/12/20 1117 12/12/20 1200  BP: 136/66 126/63  (!) 121/95  Pulse: 98 90  93  Resp: (!) 29 (!) 33  (!) 34  Temp:   100 F (37.8 C)   TempSrc:   Axillary   SpO2: 93% 93%  92%  Weight:      Height:        CBC:  Recent Labs  Lab 12/06/20 1300 12/07/20 0353 12/11/20 0137 12/12/20 0203  WBC 6.7   < > 6.9 7.4  NEUTROABS 5.6  --   --   --   HGB 9.4*   < > 9.4* 9.8*  HCT 31.4*   < > 30.5* 31.9*  MCV 94.3   < > 93.8 93.5  PLT 159   < > 234 287   < > = values in this interval not displayed.    Basic Metabolic Panel:  Recent Labs  Lab 12/08/20 1700 12/09/20 0317 12/10/20 0138 12/10/20 0636 12/11/20 0137 12/12/20 1017  NA  --  142   < > 142 141 142  K  --  3.9   < > 3.8 3.8 3.9  CL  --  106   < > 103 101 104  CO2  --  28   < > 30 31 31   GLUCOSE  --  170*   < > 173* 238* 230*  BUN  --  24*   < > 27* 27* 29*  CREATININE  --  0.66   < > 0.74 0.71 0.69  CALCIUM  --  7.8*   < > 8.3* 8.6* 8.5*  MG 2.5* 2.4  --  2.2  --   --  PHOS 3.0 3.1  --   --   --   --    < > = values in this interval not displayed.    Lipid Panel: No results for input(s): CHOL, TRIG, HDL, CHOLHDL, VLDL, LDLCALC in the last 168 hours. HgbA1c:  Lab Results  Component Value Date   HGBA1C 6.2 (H) 12/04/2020   Urine Drug Screen: No results found for: LABOPIA, COCAINSCRNUR, LABBENZ, AMPHETMU, THCU, LABBARB  Alcohol Level No results found for: [facility]  EXAMINATION  General exam   HEENT-  Normocephalic, no lesions, without obvious abnormality.  Normal external eye and conjunctiva.  Dry mucous membranes Cardiovascular- RRR  Lungs-Breathing room air, occasionally labored, occasional wheezing Musculoskeletal-no obvious deformity or swelling Skin-warm and dry, intact  Neurologic Exam: General: NAD, laying in bed Mental Status: Unable to fully assess due to level V caveat. Alert, globally aphasic. Able to  follow simple commands (improvement from yesterday) Cranial Nerves: II: Pupils equal, round III,IV, VI: ptosis not present, left gaze preference, does not cross midline V,VII: subtle right facial droop VIII: hearing normal bilaterally Sensory: Grimaces to pain on the right Motor: Moves left upper and lower extremities spontaneously; 0/5 on the right. Tone and bulk normal; no atrophy noted Gait: Unable to assess   ASSESSMENT/PLAN Mr. Gloyd Happ is a 70 y.o. male with a past medical history significant for, HLD, HTN and lumbar stenosis who presented to [facility] with SOB and diagnoses with influenza A. On the day of admission he developed mild R facial droop, R arm drift and hand grip weakness along with R leg weakness. Code stroke was initiated and he received TPA.   #Left MCA/ACA territory stroke IV tPA given 09Jun secondary to symptomatic high-grade proximal left carotid stenosis - MRI Brain: Left ACA and MCA territory stroke - CTA Head and Neck was pertinent anterior circulation stenosis w/ LICA showing severe stenosis/near occlusion. RICA was also notably stenosed. CUS revealed RICA w/60-79 % stenosis, LICA with 80-99 % stenosis - Echo: EF 55-60%, LA normal in size - Stroke labs: LDL 55, Hemoglobin A1C 6.2 - Stroke etiology is likely large vessel atherosclerosis - Continue DAPT for secondary stroke prevention with Aspirin 81 mg + Plavix 75 mg QD   # LICA stenosis - Neuro IR consulted for LICA stenosis on 6/13 for intervention - Currently neuro IR recommend outpt follow up for left ICA stenting consideration     - Agree. Would recommend watch and wait re: stent placement. More meaningful neurological improvement may be appropriate before procedure.   # NSTEMI Mr. Riendeau has a known history of cardiomyopathy.Cardiology consulted for New ST depression in V6 and worsening T wave inversion in inferior leads; likely demand ischemia in the setting of known coronary artery disease (18 stents)  and worsening hypoxia and acidosis. He underwent a cardiac cath on 12/08/20 that showed multivessel disease - Recommending medical treatment for now and state that distal lesions in RCA can be stented if patient has ischemia and has improved physical condition from a neurological standpoint. - Continuing to follow cardiology guidance   # DVT Prophylaxis - Lovenox 50mg  QD - SCDs  # Essential Hypertension (History) - Home medication: HCTZ 25 QD, Atenolol 100 mg QD, Lisinopril 20 mg QD at home - SBP goal 90-140/50-85, currently pressures are trending 120s-140s/60s-70s - Continue Metoprolol 25mg  q12h, losartan 25mg  daily   # Hyperlipidemia - LDL goal is < 70. LDL is 55 and is at goal. - Continue Atorvastatin 80 mg and Zetia 10mg  daily    #  Acute Respiratory Failure/ Influenza Pneumonia/Left Pleural Effusion Prior to admission Mr. Canelo reported having SOB and a cough. He was hypoxic in the ED and was found to be Influenza A positive - Intubated on 6/11 for increased WOB and hypoxia -> extubated 6/17 -> doing well on room air - CXR bilateral infiltrates and pulmonary edema - Albuterol nebs prn, Brovana BID, pulmicort neb BID, duoneb 4x daily, robitussin PRN - CCM on board   # DMII DMII diagnosis on admission - Hemoglobin A1C 6.2, goal <7 - CBGs in the 200s  - Continue SSI   # Dysphagia In the setting of L MCA stroke. SLP assessed him on 6/10, noted dysphagia and recommended NPO.  - Extubated 6/17 -> needs cortrak with TF   Urinary retention - Foley removed 16Jun - Voiding trial post extubation, unclear if completed  # Influenza A Pneumonia Screened for influenza given recent travel to [place] and ongoing SOB prior to admission. Noted to be febrile and hypoxic this admission. - Tamiflu 75 BID for 5 days- 12/03/20 to 12/07/20 - Treated with Azithromycin 6/11 to 6/14 - Ceftriaxone 2 g Q24 hr started 6/11-6/15   #Fever Afebrile the last 48h, 98-100 deg F - Trending CBC, so  far no leukocytosis. - Increased sputum- culture w/moderate WBC few gram positive and negative rods - CXR and UA ordered 6/14 as a part of fever work up. 6/14 CXR with slight increase in the degree of left basilar consolidation with associated effusion. 6/14 UA negative for UTI.  #Dispo - Considering Mr. Meiner improvements (extubation, remaining so), improved neurological function, he can move to the neuro floor for continued care. - When medically appropriate. PT/OT suggest [facility]  Hospital day # 9  Bruna Potter PhD, PA-C Neurology Stroke Attending Note :  I have personally obtained history,examined this patient, reviewed notes, independently viewed imaging studies, participated in medical decision making and plan of care.ROS completed by me personally and pertinent positives fully documented  I have made any additions or clarifications directly to the above note. Agree with note above.  Patient remains aphasic though he can follow some simple midline and occasional one-step commands on the left.  Continues to have dense right hemiplegia on exam.  He does have symptomatic severe left carotid stenosis but revascularization will have to wait till patient shows at least modest improvement in rehab.  He also has significant underlying coronary artery disease which also may need cardiac cath in the future.  Recommend transfer outside the ICU to neurology stepdown bed.  Mobilize out of bed.  Avoid hypotension.  Continue ongoing therapies.  No family available at the bedside for discussion.  Discussed with Dr. Judeth Horn critical care medicine.This patient is critically ill and at significant risk of neurological worsening, death and care requires constant monitoring of vital signs, hemodynamics,respiratory and cardiac monitoring, extensive review of multiple databases, frequent neurological assessment, discussion with family, other specialists and medical decision making of high complexity.I have made any  additions or clarifications directly to the above note.This critical care time does not reflect procedure time, or teaching time or supervisory time of PA/NP/Med Resident etc but could involve care discussion time.  I spent 30 minutes of neurocritical care time  in the care of  this patient.      Delia Heady, MD Medical Director [facility] Pager: [phone] 12/12/2020 2:22 PM

## 2020-12-13 ENCOUNTER — Inpatient Hospital Stay (HOSPITAL_COMMUNITY): Payer: Medicare (Managed Care)

## 2020-12-13 DIAGNOSIS — J101 Influenza due to other identified influenza virus with other respiratory manifestations: Secondary | ICD-10-CM | POA: Diagnosis not present

## 2020-12-13 LAB — BASIC METABOLIC PANEL
Anion gap: 10 (ref 5–15)
BUN: 35 mg/dL — ABNORMAL HIGH (ref 8–23)
CO2: 32 mmol/L (ref 22–32)
Calcium: 8.7 mg/dL — ABNORMAL LOW (ref 8.9–10.3)
Chloride: 100 mmol/L (ref 98–111)
Creatinine, Ser: 0.76 mg/dL (ref 0.61–1.24)
GFR, Estimated: >60 mL/min (ref >60)
Glucose, Bld: 217 mg/dL — ABNORMAL HIGH (ref 70–99)
Potassium: 4.1 mmol/L (ref 3.5–5.1)
Sodium: 142 mmol/L (ref 135–145)

## 2020-12-13 LAB — CBC
HCT: 35.3 % — ABNORMAL LOW (ref 39.0–52.0)
Hemoglobin: 10.7 g/dL — ABNORMAL LOW (ref 13.0–17.0)
MCH: 28.3 pg (ref 26.0–34.0)
MCHC: 30.3 g/dL (ref 30.0–36.0)
MCV: 93.4 fL (ref 80.0–100.0)
Platelets: 358 10*3/uL (ref 150–400)
RBC: 3.78 MIL/uL — ABNORMAL LOW (ref 4.22–5.81)
RDW: 14.9 % (ref 11.5–15.5)
WBC: 6 10*3/uL (ref 4.0–10.5)
nRBC: 0 % (ref 0.0–0.2)

## 2020-12-13 LAB — GLUCOSE, CAPILLARY
Glucose-Capillary: 123 mg/dL — ABNORMAL HIGH (ref 70–99)
Glucose-Capillary: 191 mg/dL — ABNORMAL HIGH (ref 70–99)
Glucose-Capillary: 200 mg/dL — ABNORMAL HIGH (ref 70–99)
Glucose-Capillary: 217 mg/dL — ABNORMAL HIGH (ref 70–99)
Glucose-Capillary: 221 mg/dL — ABNORMAL HIGH (ref 70–99)
Glucose-Capillary: 227 mg/dL — ABNORMAL HIGH (ref 70–99)
Glucose-Capillary: 232 mg/dL — ABNORMAL HIGH (ref 70–99)
Glucose-Capillary: 234 mg/dL — ABNORMAL HIGH (ref 70–99)

## 2020-12-13 MED ORDER — INSULIN ASPART 100 UNIT/ML IJ SOLN
5.0000 [IU] | INTRAMUSCULAR | Status: DC
  Administered 2020-12-13 – 2020-12-15 (×13): 5 [IU] via SUBCUTANEOUS

## 2020-12-13 MED ORDER — INSULIN GLARGINE 100 UNIT/ML ~~LOC~~ SOLN
20.0000 [IU] | Freq: Every day | SUBCUTANEOUS | Status: DC
  Administered 2020-12-13: 20 [IU] via SUBCUTANEOUS
  Filled 2020-12-13 (×2): qty 0.2

## 2020-12-13 MED ORDER — FUROSEMIDE 10 MG/ML IJ SOLN
40.0000 mg | Freq: Once | INTRAMUSCULAR | Status: AC
  Administered 2020-12-13: 40 mg via INTRAVENOUS
  Filled 2020-12-13: qty 4

## 2020-12-13 MED ORDER — LIP MEDEX EX OINT: TOPICAL_OINTMENT | CUTANEOUS | Status: DC | PRN

## 2020-12-13 NOTE — Consult Note (Signed)
Subjective:  Patient more awake today responds to verbal commands but aphasic.  Discussed with patient's sister regarding recent non-Q wave MI and occlusion of saphenous vein graft.  Also noted to have distal small vessel RCA lesion which appears to be stable.  LIMA to LAD was not visualized as it was not engaged probably patent in view of 2D echo findings, cannot rule out any significant disease in LIMA graft.  Objective:  Vital Signs in the last 24 hours: Temp:  [99.7 F (37.6 C)-100.8 F (38.2 C)] 100.4 F (38 C) (06/19 1100) Pulse Rate:  [88-108] 108 (06/19 1100) Resp:  [23-41] 34 (06/19 1100) BP: (112-140)/(58-95) 129/73 (06/19 1100) SpO2:  [88 %-96 %] 93 % (06/19 1119)  Intake/Output from previous day: 06/18 0701 - 06/19 0700 In: 1500 [NG/GT:1500] Out: 3010 [Urine:3010] Intake/Output from this shift: Total I/O In: 240 [NG/GT:240] Out: 1200 [Urine:1200]  Physical Exam: Exam unchanged  Lab Results: Recent Labs    12/12/20 0203 12/13/20 0342  WBC 7.4 6.0  HGB 9.8* 10.7*  PLT 287 358   Recent Labs    12/12/20 1017 12/13/20 0342  NA 142 142  K 3.9 4.1  CL 104 100  CO2 31 32  GLUCOSE 230* 217*  BUN 29* 35*  CREATININE 0.69 0.76   No results for input(s): TROPONINI in the last 72 hours.  Invalid input(s): CK, MB Hepatic Function Panel No results for input(s): PROT, ALBUMIN, AST, ALT, ALKPHOS, BILITOT, BILIDIR, IBILI in the last 72 hours. No results for input(s): CHOL in the last 72 hours. No results for input(s): PROTIME in the last 72 hours.  Imaging: Imaging results have been reviewed  Cardiac Studies:  Assessment/Plan:  Acute NSTEMI status post left cardiac catheterization  Multivessel CAD with multivessel PCI requiring 18 stents in remote past in [place] S/P CABG the saphenous vein graft occlusion culprit for his non-STEMI, LIMA not visualized.   Severe cerebrovascular disease Status post Acute respirator failure with hypoxia Acute left  cerebral infarct with right sided hemiplegia status post TPA Anemia of blood loss Influenza A pneumonia Hypertension. Diabetes mellitus. Hyperlipidemia. History of spinal stenosis. Plan Continue present management agree with neuro rehab. Patient very high risk for any intervention at present and will not help in his acute recovery. Discussed with family extensively and agrees for medical management for now  LOS: 10 days    Rinaldo Cloud 12/13/2020, 11:39 AM

## 2020-12-13 NOTE — Progress Notes (Signed)
  Speech Language Pathology Treatment: Dysphagia  Patient Details Name: Jemaine Prokop MRN: [id number] DOB: 14-Feb-1951 Today's Date: 12/13/2020 Time: 8588-5027 SLP Time Calculation (min) (ACUTE ONLY): 25 min  Assessment / Plan / Recommendation Clinical Impression  Patient seen to address dysphagia goals. Per RN, patient or family had been requesting ice. Patient opened eyes slightly when SLP entered room but was never fully alert. He did perform basic actions of opening mouth when SLP completing oral care. SLP spent considerable amount of time removing dried secretions on teeth, hard and soft palate and on tongue. When SLP suctioning towards soft palate, patient would start to gag and then would cough up thick yellow-whitish secretions which SLP would suction from back of oral cavity. After oral care completed, SLP provided toothette soaked in water and use this moisten patient's mouth. He did start sucking on sponge and appeared to initiate one swallow. Patient is still not ready for PO's as he is not alert enough, is not able to manage his secretions adequately and remains at very high aspiration risk.   HPI HPI: Pt is a 70 year old male who was admitted for SOB from influenza A on 6/9 and developed R face and R sided weakness. Pt received tPA after normal head CT and subsequently exhibited worsening responsiveness; repeat CT on that date was still negative for acute changes. CTA: Extensive bulky calcified plaque about the left carotid bulb/proximal left ICA with associated severe near occlusive stenosis. Left ICA, MCA, and ACA markedly attenuated but patent distally. PMH: CAD, HTN, HLD.  Most recent chest xray completed on 6/14 was showing slight increase in the degree of left basilar consolidation with  associated effusion.  ST has been reconsulted following extubation after an approximately 6 day intubation.      SLP Plan  Continue with current plan of care       Recommendations  Diet  recommendations: NPO Medication Administration: Via alternative means                Oral Care Recommendations: Oral care QID;Staff/trained caregiver to provide oral care Follow up Recommendations: Inpatient Rehab SLP Visit Diagnosis: Dysphagia, unspecified (R13.10) Plan: Continue with current plan of care       Angela Nevin, MA, CCC-SLP Speech Therapy [facility] Acute Rehab

## 2020-12-13 NOTE — Progress Notes (Signed)
Inpatient Rehab Admissions Coordinator Note:   Per PT/OT/ST recommendations, pt was screened for CIR candidacy by Wolfgang Phoenix, MS, CCC-SLP.  At this time we are recommending an inpatient rehab consult.  AC will place consult order per protocol.   Please contact me with questions.    Wolfgang Phoenix, MS, CCC-SLP Admissions Coordinator [phone] 12/13/20 4:25 PM

## 2020-12-13 NOTE — Progress Notes (Signed)
   NAME:  Brandon Daniel, MRN:  [id number], DOB:  August 24, 1950, LOS: 10 ADMISSION DATE:  12/03/2020, CONSULTATION DATE:  12/04/20 REFERRING MD:  Derry Lory, CHIEF COMPLAINT:  weakness   History of Present Illness:  70 year old man with hx of CAD, HTN, HLD admitted for SOB from influenza A on 12/03/20.  Developed R face and R sided weakness this evening and received TPA after normal head CT.  After this he developed some worsening responsiveness so another CT ordered which looks fine. PCCM consulted to monitor airway.  Pertinent  Medical History  Asthma/COPD overlad CAD HLD HTN  Significant Hospital Events: Including procedures, antibiotic start and stop dates in addition to other pertinent events   6/9 admitted 6/9 evening code stroke, tpa, PCCM eval 6/10 Intubated for resp distress 6/11 Lt IJ CVL placed, levo for hypotension, started heparin drip for NSTEMI and cardiology consulted 6/14 Left Heart Cath  6/15  started lasix to help with vent liberation 6/16 diuresed 6/17 extubated, diuresed, 5L O2 needed   Interim History / Subjective:  NAEON. O2 a little better. Continue diuresis.  Objective   Blood pressure 129/73, pulse (!) 108, temperature (!) 100.4 F (38 C), resp. rate (!) 34, height 5' 9.02" (1.753 m), weight 100.5 kg, SpO2 93 %.        Intake/Output Summary (Last 24 hours) at 12/13/2020 1140 Last data filed at 12/13/2020 1100 Gross per 24 hour  Intake 1500 ml  Output 4210 ml  Net -2710 ml    Filed Weights   12/10/20 0500 12/11/20 0500 12/12/20 0500  Weight: 100.9 kg 100.8 kg 100.5 kg    Examination: Gen:      No acute distress on vent`. Patient alert in bed HEENT: ncat,sclera anicteric,  Neck:     No masses; no thyromegaly Lungs:   extubated; improving minimal bilateral rhonchi  CV:         Regular rate and rhythm; no murmurs Abd:      + bowel sounds; soft, non-tender; no palpable masses, no distension Skin:      Warm and dry; no rash Neuro: Right hemiparesis,  follows commands.  Appears to have expressive aphasia. Labs/imaging that I havepersonally reviewed  (right click and "Reselect all SmartList Selections" daily)  BG, cbc  Resolved Hospital Problem list   N/a  Assessment & Plan:  Acute respiratory failure due to inability to clear secretions, acute pulmonary edema, Influenza A pna Fever, improved --5L [place], possible COPD per nephew --Wean O2 goal sat 88% --Diurese   Left ACA, MCA stroke s/p tPA --statin, ASA, plavix --SLP, OT, PT --TTE ok, no Afib seen, ICA stenosis to be dealt with OP --Vertebral stensois to be dealt with OP  Non ST elevation MI, history of cardiomyopathy ischemic s/p CABG, acute pulmonary edema --LHC revealed restenosed grafts and multivessel disease  Low hemoglobin.  No evidence of active bleed Likely secondary to critical illness Follow CBC  AKI, improved  Supportive care, monitor urine output and creatinine Electrolyte replacement per protocol   Small bilateral pleural effusion -diurese  Prediabetes  HbA1C 6.2 Increase  Lantus 20 units. Increase TF coverage to 5u SSI   Nutrition Tube feeds via [employer]  Best practice (right click and "Reselect all SmartList Selections" daily)  Per primary  CRITICAL CARE n/a   Karren Burly, MD

## 2020-12-13 NOTE — Progress Notes (Addendum)
STROKE TEAM PROGRESS NOTE   INTERVAL HISTORY  No acute events overnight.  His wife and sister at the bedside as his cardiologist Dr. Sharyn Lull Neurological examination remains unchanged and stable; he is aphasic and follows limited commands to the RUE (Lifts RUE + wiggles toes to RLE)    PERTINENT IMAGING   CT Head WO IV Contrast 10Jun2022 No acute intracranial abnormality. ASPECTS is 10.   MRI Brain WO IV Contrast 10Jun2022 Diffuse gray matter infarction in the left ACA and MCA territories.   Echocardiogram Complete 10Jun2022 Left ventricular ejection fraction, by estimation, is 55 to 60%. The left ventricle has normal function. Left ventricular diastolic parameters are indeterminate. Elevated left ventricular end-diastolic pressure.  Right ventricular systolic function is normal. The right ventricular size is normal. There is normal pulmonary artery systolic pressure. The estimated right ventricular systolic pressure is 11.0 mmHg.  The mitral valve is normal in structure. No evidence of mitral valve regurgitation. No evidence of mitral stenosis.  The aortic valve was not well visualized. Aortic valve regurgitation is not visualized. No aortic stenosis is present.  The inferior vena cava is normal in size with greater than 50% respiratory variability, suggesting right atrial pressure of 3 mmHg.   VAS US Carotid 10Jun2022 Right ICA 60-79% stenosis Left ICA 80-99% stenosis   CTA Head and Neck W WO IV Contrast 09Jun2022 Negative CTA for emergent large vessel occlusion. Extensive bulky calcified plaque about the left carotid bulb/proximal left ICA with associated severe near occlusive stenosis. Left ICA, MCA, and ACA markedly attenuated but remain patent distally. Short-segment 70% atheromatous stenosis at the proximal cervical right ICA. Severe left and moderate right vertebral artery origin stenoses.  Vitals:   12/13/20 1000 12/13/20 1100 12/13/20 1119 12/13/20 1200  BP: 125/76  129/73  127/69  Pulse: (!) 105 (!) 108  (!) 101  Resp: (!) 35 (!) 34  (!) 36  Temp: (!) 100.4 F (38 C) (!) 100.4 F (38 C)  (!) 100.58 F (38.1 C)  TempSrc:      SpO2: 94% 93% 93% 93%  Weight:      Height:        CBC:  Recent Labs  Lab 12/12/20 0203 12/13/20 0342  WBC 7.4 6.0  HGB 9.8* 10.7*  HCT 31.9* 35.3*  MCV 93.5 93.4  PLT 287 358    Basic Metabolic Panel:  Recent Labs  Lab 12/08/20 1700 12/09/20 0317 12/10/20 0138 12/10/20 0636 12/11/20 0137 12/12/20 1017 12/13/20 0342  NA  --  142   < > 142   < > 142 142  K  --  3.9   < > 3.8   < > 3.9 4.1  CL  --  106   < > 103   < > 104 100  CO2  --  28   < > 30   < > 31 32  GLUCOSE  --  170*   < > 173*   < > 230* 217*  BUN  --  24*   < > 27*   < > 29* 35*  CREATININE  --  0.66   < > 0.74   < > 0.69 0.76  CALCIUM  --  7.8*   < > 8.3*   < > 8.5* 8.7*  MG 2.5* 2.4  --  2.2  --   --   --   PHOS 3.0 3.1  --   --   --   --   --    < > =  values in this interval not displayed.    Lipid Panel: No results for input(s): CHOL, TRIG, HDL, CHOLHDL, VLDL, LDLCALC in the last 168 hours. HgbA1c:  Lab Results  Component Value Date   HGBA1C 6.2 (H) 12/04/2020   Urine Drug Screen: No results found for: LABOPIA, COCAINSCRNUR, LABBENZ, AMPHETMU, THCU, LABBARB  Alcohol Level No results found for: [facility]  EXAMINATION  General exam   HEENT-  Normocephalic, no lesions, without obvious abnormality.  Normal external eye and conjunctiva.  Dry mucous membranes Cardiovascular- RRR  Lungs-Breathing room air, occasionally labored, occasional wheezing Musculoskeletal-no obvious deformity or swelling Skin-warm and dry, intact  Neurologic Exam: General: Brandon Daniel not in distress, laying in bed Mental Status:  Alert, globally aphasic. Able to follow few simple commands speech mostly nonverbal with tries to speak. Cranial Nerves: II: Pupils equal, round III,IV, VI: ptosis not present, left gaze preference, does not cross  midline V,VII: subtle right facial droop VIII: hearing normal bilaterally Sensory: Grimaces to pain on the right Motor: Moves left upper and lower extremities spontaneously against gravity; 0/5 on the right. Tone and bulk normal; no atrophy noted Gait: Unable to assess   ASSESSMENT/PLAN Brandon Daniel is a 70 y.o. Daniel with a past medical history significant for, HLD, HTN and lumbar stenosis who presented to [facility] with SOB and diagnoses with influenza A. On the day of admission he developed mild R facial droop, R arm drift and hand grip weakness along with R leg weakness. Code stroke was initiated and he received TPA.   #Left MCA/ACA territory stroke IV tPA given 09 Jun secondary to symptomatic high-grade proximal left carotid stenosis - MRI Brain: Left ACA and MCA territory stroke - CTA Head and Neck was pertinent anterior circulation stenosis w/ LICA showing severe stenosis/near occlusion. RICA was also notably stenosed. CUS revealed RICA w/60-79 % stenosis, LICA with 80-99 % stenosis - Echo: EF 55-60%, LA normal in size - Stroke labs: LDL 55, Hemoglobin A1C 6.2 - Stroke etiology is likely large vessel atherosclerosis - Continue DAPT for secondary stroke prevention with Aspirin 81 mg + Plavix 75 mg QD   # LICA stenosis - Neuro IR consulted for LICA stenosis on 6/13 for intervention - Currently neuro IR recommend outpt follow up for left ICA stenting consideration     - Agree. Would recommend watch and wait re: stent placement. More meaningful neurological improvement may be appropriate before procedure.   # NSTEMI Brandon Daniel has a known history of cardiomyopathy.Cardiology consulted for New ST depression in V6 and worsening T wave inversion in inferior leads; likely demand ischemia in the setting of known coronary artery disease (18 stents) and worsening hypoxia and acidosis. He underwent a cardiac cath on 12/08/20 that showed multivessel disease - Recommending medical treatment for now  and state that distal lesions in RCA can be stented if patient has ischemia and has improved physical condition from a neurological standpoint. - Continuing to follow cardiology guidance   # DVT Prophylaxis - Lovenox 50mg  QD - SCDs  # Essential Hypertension (History) - Home medication: HCTZ 25 QD, Atenolol 100 mg QD, Lisinopril 20 mg QD at home - SBP goal 90-140/50-85, currently pressures are trending 120s-140s/60s-70s - Continue Metoprolol 25mg  q12h, losartan 25mg  daily   # Hyperlipidemia - LDL goal is < 70. LDL is 55 and is at goal. - Continue Atorvastatin 80 mg and Zetia 10mg  daily   #Acute Respiratory Failure/ Influenza Pneumonia/Left Pleural Effusion Prior to admission Mr. Navarette reported having SOB  and a cough. He was hypoxic in the ED and was found to be Influenza A positive - Intubated on 6/11 for increased WOB and hypoxia -> extubated 6/17 -> doing well on room air - CXR bilateral infiltrates and pulmonary edema - Albuterol nebs prn, Brovana BID, pulmicort neb BID, duoneb 4x daily, robitussin PRN - CCM on board   # DMII DMII diagnosis on admission - Hemoglobin A1C 6.2, goal <7 - CBGs in the 200s  - Continue SSI   #Dysphagia In the setting of L MCA stroke. SLP assessed him on 6/10, noted dysphagia and recommended NPO.  - Extubated 6/17 -> needs cortrak with TF   #Urinary retention - Foley removed 16Jun - Voiding trial post extubation, unclear if completed  # Influenza A Pneumonia Screened for influenza given recent travel to [place] and ongoing SOB prior to admission. Noted to be febrile and hypoxic this admission. - Tamiflu 75 BID for 5 days- 12/03/20 to 12/07/20 - Treated with Azithromycin 6/11 to 6/14 - Ceftriaxone 2 g Q24 hr started 6/11-6/15   #Fever Afebrile the last 48h, 98-100 deg F - Trending CBC, so far no leukocytosis. - Increased sputum- culture w/moderate WBC few gram positive and negative rods - CXR and UA ordered 6/14 as a part of fever work  up. 6/14 CXR with slight increase in the degree of left basilar consolidation with associated effusion. 6/14 UA negative for UTI.  #Dispo - Considering Mr. Gheen improvements (extubation, remaining so), improved neurological function, he can move to the neuro floor for continued care. - When medically appropriate. PT/OT suggest [facility]  Hospital day # 10  Stark Jock, NP  Stroke Nurse Practitioner   Stroke Attending Note :  I have personally obtained history,examined this patient, reviewed notes, independently viewed imaging studies, participated in medical decision making and plan of care.ROS completed by me personally and pertinent positives fully documented  I have made any additions or clarifications directly to the above note. Agree with note above.  Patient remains aphasic though he can follow some simple midline and occasional one-step commands on the left.  Continues to have dense right hemiplegia on exam.  He does have symptomatic severe left carotid stenosis but revascularization will have to wait till patient shows at least modest improvement in rehab.  He also has significant underlying coronary artery disease which also may need cardiac cath in the future.  Recommend transfer outside the ICU to neurology stepdown bed.  Mobilize out of bed.  Avoid hypotension.  Continue ongoing therapies.  Long discussion with patient's wife and sister at the bedside and with Dr. Sharyn Lull cardiologist and answered questions..This patient is critically ill and at significant risk of neurological worsening, death and care requires constant monitoring of vital signs, hemodynamics,respiratory and cardiac monitoring, extensive review of multiple databases, frequent neurological assessment, discussion with family, other specialists and medical decision making of high complexity.I have made any additions or clarifications directly to the above note.This critical care time does not reflect procedure time, or  teaching time or supervisory time of PA/NP/Med Resident etc but could involve care discussion time.  I spent 30 minutes of neurocritical care time  in the care of  this patient.      Delia Heady, MD Medical Director [facility] Pager: [phone] 12/13/2020 1:00 PM

## 2020-12-14 LAB — GLUCOSE, CAPILLARY
Glucose-Capillary: 181 mg/dL — ABNORMAL HIGH (ref 70–99)
Glucose-Capillary: 197 mg/dL — ABNORMAL HIGH (ref 70–99)
Glucose-Capillary: 208 mg/dL — ABNORMAL HIGH (ref 70–99)
Glucose-Capillary: 240 mg/dL — ABNORMAL HIGH (ref 70–99)
Glucose-Capillary: 251 mg/dL — ABNORMAL HIGH (ref 70–99)
Glucose-Capillary: 268 mg/dL — ABNORMAL HIGH (ref 70–99)

## 2020-12-14 LAB — BASIC METABOLIC PANEL
Anion gap: 8 (ref 5–15)
BUN: 57 mg/dL — ABNORMAL HIGH (ref 8–23)
CO2: 33 mmol/L — ABNORMAL HIGH (ref 22–32)
Calcium: 8.4 mg/dL — ABNORMAL LOW (ref 8.9–10.3)
Chloride: 103 mmol/L (ref 98–111)
Creatinine, Ser: 1.08 mg/dL (ref 0.61–1.24)
GFR, Estimated: >60 mL/min (ref >60)
Glucose, Bld: 230 mg/dL — ABNORMAL HIGH (ref 70–99)
Potassium: 4 mmol/L (ref 3.5–5.1)
Sodium: 144 mmol/L (ref 135–145)

## 2020-12-14 LAB — CBC
HCT: 35.4 % — ABNORMAL LOW (ref 39.0–52.0)
Hemoglobin: 10.5 g/dL — ABNORMAL LOW (ref 13.0–17.0)
MCH: 27.6 pg (ref 26.0–34.0)
MCHC: 29.7 g/dL — ABNORMAL LOW (ref 30.0–36.0)
MCV: 93.2 fL (ref 80.0–100.0)
Platelets: 391 10*3/uL (ref 150–400)
RBC: 3.8 MIL/uL — ABNORMAL LOW (ref 4.22–5.81)
RDW: 15 % (ref 11.5–15.5)
WBC: 6.7 10*3/uL (ref 4.0–10.5)
nRBC: 0 % (ref 0.0–0.2)

## 2020-12-14 MED ORDER — FREE WATER
100.0000 mL | Status: DC
  Administered 2020-12-14 – 2020-12-16 (×13): 100 mL

## 2020-12-14 MED ORDER — SODIUM CHLORIDE 3 % IN NEBU
4.0000 mL | INHALATION_SOLUTION | Freq: Two times a day (BID) | RESPIRATORY_TRACT | Status: AC
  Administered 2020-12-14 – 2020-12-16 (×6): 4 mL via RESPIRATORY_TRACT
  Filled 2020-12-14 (×6): qty 4

## 2020-12-14 MED ORDER — INSULIN GLARGINE 100 UNIT/ML ~~LOC~~ SOLN
15.0000 [IU] | Freq: Two times a day (BID) | SUBCUTANEOUS | Status: DC
  Administered 2020-12-14 – 2020-12-25 (×23): 15 [IU] via SUBCUTANEOUS
  Filled 2020-12-14 (×27): qty 0.15

## 2020-12-14 MED ORDER — PANTOPRAZOLE SODIUM 40 MG PO PACK
40.0000 mg | PACK | Freq: Every day | ORAL | Status: DC
  Administered 2020-12-14 – 2020-12-25 (×12): 40 mg
  Filled 2020-12-14 (×11): qty 20

## 2020-12-14 NOTE — Progress Notes (Signed)
Physical Therapy Treatment Patient Details Name: Brandon Daniel MRN: [id number] DOB: 02-20-1951 Today's Date: 12/14/2020    History of Present Illness [name] is a 70 y.o. male admitted 6/9 that  presented with SOB, was found to be Influenza A + and later on day of admission developed mild R facial droop, R arm drift and hand grip weakness along with R leg weakness. Left MCA and ACA CVA. Pt received tPa. VDRF 6/11-6/15. PMH: CAD, HTN, lumbar stenosis, HLD    PT Comments    Pt admitted with above diagnosis. Pt was able to sit EOB for 10 min with min guard for brief periods to max assist at times. Pt continues to follow commands with delayed processing.  Pt with poor balance reactions. Will continue to follow acutely.  Pt currently with functional limitations due to balance and endurance deficits. Pt will benefit from skilled PT to increase their independence and safety with mobility to allow discharge to the venue listed below.      Follow Up Recommendations  CIR     Equipment Recommendations  Other (comment) (TBA)    Recommendations for Other Services Rehab consult     Precautions / Restrictions Precautions Precautions: Fall Restrictions Weight Bearing Restrictions: No    Mobility  Bed Mobility Overal bed mobility: Needs Assistance Bed Mobility: Rolling;Sidelying to Sit;Sit to Sidelying Rolling: Max assist Sidelying to sit: Total assist     Sit to sidelying: Max assist General bed mobility comments: Pt able to assist minimally with rolling Lt and Rt, he required assist to move LEs off the EOB and to lift trunk.  He was able to assist minmally with return to supine and attempted to assist with scooting toward the EOB    Transfers                    Ambulation/Gait                 Stairs             Wheelchair Mobility    Modified Rankin (Stroke Patients Only) Modified Rankin (Stroke Patients Only) Pre-Morbid Rankin Score: No  symptoms Modified Rankin: Severe disability     Balance Overall balance assessment: Needs assistance Sitting-balance support: Single extremity supported;No upper extremity supported;Feet supported Sitting balance-Leahy Scale: Poor Sitting balance - Comments: Pt sat a total of 10 minutes at EOB with varying assist from max to min guard at times (min guard for brief periods with pt unable to sustain on his own for more than seconds).  Pt losing balance all directions. holding his head to left. Postural control: Posterior lean;Right lateral lean;Left lateral lean                                  Cognition Arousal/Alertness: Lethargic;Awake/alert Behavior During Therapy: Flat affect Overall Cognitive Status: Impaired/Different from baseline Area of Impairment: Attention;Following commands;Problem solving                   Current Attention Level: Focused   Following Commands: Follows one step commands inconsistently;Follows one step commands with increased time     Problem Solving: Slow processing;Decreased initiation;Difficulty sequencing;Requires verbal cues;Requires tactile cues General Comments: Pt lethargic, but does arouse once EOB demonsrating periods of eye opening.  He looked to therapist on Lt on command and was able to assist with rolling on command.  He does demonstrate a delay in processing  and is slow to initiate activity      Exercises General Exercises - Lower Extremity Ankle Circles/Pumps: AROM;Left;10 reps;Supine (P/ROM right) Heel Slides: AROM;Left;Supine;5 reps (P/ROM right) Straight Leg Raises: AROM;10 reps;Left;Supine    General Comments General comments (skin integrity, edema, etc.): SpO2 as low as 87% at end of treatment on 5L but with incr time, O2 >90%.  Other VSS      Pertinent Vitals/Pain Faces Pain Scale: No hurt    Home Living                      Prior Function            PT Goals (current goals can now be found  in the care plan section) Acute Rehab PT Goals Patient Stated Goal: to get better Progress towards PT goals: Progressing toward goals    Frequency    Min 4X/week      PT Plan Current plan remains appropriate    Co-evaluation              AM-PAC PT "6 Clicks" Mobility   Outcome Measure  Help needed turning from your back to your side while in a flat bed without using bedrails?: Total Help needed moving from lying on your back to sitting on the side of a flat bed without using bedrails?: Total Help needed moving to and from a bed to a chair (including a wheelchair)?: Total Help needed standing up from a chair using your arms (e.g., wheelchair or bedside chair)?: Total Help needed to walk in hospital room?: Total Help needed climbing 3-5 steps with a railing? : Total 6 Click Score: 6    End of Session Equipment Utilized During Treatment: Oxygen Activity Tolerance: Patient limited by fatigue Patient left: in bed;with call bell/phone within reach;with bed alarm set Nurse Communication: Mobility status;Need for lift equipment PT Visit Diagnosis: Muscle weakness (generalized) (M62.81);Hemiplegia and hemiparesis Hemiplegia - Right/Left: Right Hemiplegia - dominant/non-dominant: Dominant Hemiplegia - caused by: Cerebral infarction     Time: 1027-2536 PT Time Calculation (min) (ACUTE ONLY): 17 min  Charges:  $Therapeutic Activity: 8-22 mins                     Anjolie Majer M,PT Acute Rehab Services [phone] (pager)    Bevelyn Buckles 12/14/2020, 4:00 PM

## 2020-12-14 NOTE — Progress Notes (Addendum)
Inpatient Rehabilitation Admissions Coordinator   I met at bedside with patient and a sister. We discussed briefly the goals and expectations of a possible CIR admit. Sister familiar with the program through her husband who was a patient before. I clarified that Royetta Car, was main contact for the family. I will call him to discuss potential plans for CIR admit.   Danne Baxter, RN, MSN Rehab Admissions Coordinator [phone] 12/14/2020 10:44 AM  I spoke with his nephew, Marinus Maw, by phone. We discussed goals and expectations of a possible Cir admit. I explained that we need to see how he progresses this week to see if he can tolerate the intensity required of a possible Cir admit vs slower paced program such as SNF. I will follow up after additional therapy treatments.  Danne Baxter, RN, MSN Rehab Admissions Coordinator [phone] 12/14/2020 11:28 AM

## 2020-12-14 NOTE — Progress Notes (Signed)
  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 Pt is aware of PO, points to and reaches for cup for self feeding. However even with significant sensory feedback from sips of juice or teaspoons of puree pt only able to achieve partial mandibular closure with lingual thrusting. Majority of liquids spill anteriorally or pool around teeth. One swallow was elicited as liquids spilled to pharynx, followed by coughing. Pt is not ready for POs or instrumental assessment. Pt will need aggressive sensory feedback when being given PO trials in order to elicit motor movement. Will continue efforts, though expect pt will need a few weeks to even partially recover some swallowing ability.  Pt is not able to achieve any phonation or articulation with cueing to repeat name or count. Pt does show some auditory comprehension with simple commands, but not complex. His Yes/No responses (head nod/finger wag) are inaccurate. He does use gestures to communicate basic wants and needs and this is an area that should be explored for total communication techniques. Though pointing to a communication board could be considered, pts left visual gaze may also complicate this. Recommend CIR.   HPI        SLP Plan  Continue with current plan of care       Recommendations  Diet recommendations: NPO Medication Administration: Via alternative means                Oral Care Recommendations: Oral care QID;Staff/trained caregiver to provide oral care Follow up Recommendations: Inpatient Rehab SLP Visit Diagnosis: Dysphagia, unspecified (R13.10) Plan: Continue with current plan of care       GO               Harlon Ditty, MA  CCC-SLP  Acute Rehabilitation Services Pager [phone] Office [phone]  Claudine Mouton 12/14/2020, 11:26 AM

## 2020-12-14 NOTE — Progress Notes (Signed)
NAME:  Ceaser Ebeling, MRN:  [id number], DOB:  1951-06-22, LOS: 11 ADMISSION DATE:  12/03/2020, CONSULTATION DATE:  12/04/20 REFERRING MD:  Derry Lory, CHIEF COMPLAINT:  weakness   History of Present Illness:  70 year old man with hx of CAD, HTN, HLD admitted for SOB from influenza A on 12/03/20.  Developed R face and R sided weakness this evening and received TPA after normal head CT.  After this he developed some worsening responsiveness so another CT ordered which looks fine. PCCM consulted to monitor airway.  Pertinent  Medical History  Asthma/COPD overlad CAD HLD HTN  Significant Hospital Events: Including procedures, antibiotic start and stop dates in addition to other pertinent events   6/9 admitted 6/9 evening code stroke, tpa, PCCM eval 6/10 Intubated for resp distress 6/11 Lt IJ CVL placed, levo for hypotension, started heparin drip for NSTEMI and cardiology consulted 6/14 Left Heart Cath >> multiple chronic total or near total occlusions. 6/15  started lasix to help with vent liberation 6/16 diuresed 6/17 extubated, diuresed, 5L O2 needed   Interim History / Subjective:   On 5 L nasal cannula Remains tenuous   Objective   Blood pressure (!) 134/97, pulse (!) 104, temperature 99.68 F (37.6 C), temperature source Bladder, resp. rate (!) 33, height 5' 9.02" (1.753 m), weight 100.5 kg, SpO2 95 %.    FiO2 (%):  [40 %] 40 %   Intake/Output Summary (Last 24 hours) at 12/14/2020 0850 Last data filed at 12/14/2020 0600 Gross per 24 hour  Intake 780 ml  Output 1650 ml  Net -870 ml    Filed Weights   12/10/20 0500 12/11/20 0500 12/12/20 0500  Weight: 100.9 kg 100.8 kg 100.5 kg    Examination: Gen:      No acute distress on vent`. Patient alert in bed HEENT: ncat,sclera anicteric,  Neck:     No masses; no thyromegaly Lungs:   Mild accessory muscle use, scattered rhonchi on left CV:         Regular rate and rhythm; no murmurs Abd:      + bowel sounds; soft,  non-tender; no palpable masses, no distension Skin:      Warm and dry; no rash Neuro: Right hemiplegia, expressive aphasia , follows commands   Labs/imaging that I havepersonally reviewed  (right click and "Reselect all SmartList Selections" daily)  Chest x-ray 6/19 independently reviewed shows left basal consolidation and right basilar atelectasis  Labs show normal electrolytes, hyperglycemia, no leukocytosis, stable anemia  Resolved Hospital Problem list   N/a  Assessment & Plan:  Acute respiratory failure due to inability to clear secretions, acute pulmonary edema, Influenza A pna Fever, improved --5L , possible COPD per nephew --Wean O2 goal sat 88% -Tracheobronchial toilet, add hypertonic saline nebs, chest PT via bed -DC droplet precautions  Left ACA, MCA stroke s/p tPA --TTE ok, no Afib seen, ICA stenosis to be dealt with OP --Vertebral stensois to be dealt with OP --statin, ASA, plavix --SLP, OT, PT   Non ST elevation MI, history of cardiomyopathy ischemic s/p CABG, acute pulmonary edema --LHC revealed restenosed grafts and multivessel disease -Medical management per cardiology  Low hemoglobin.  No evidence of active bleed , stable Likely secondary to critical illness  AKI -resolved but creatinine trending back up. Hold Lasix  New onset diabetes , uncontrolled HbA1C 6.2 Increase  Lantus 15 Q 12H TF coverage to 5u SSI   Nutrition Tube feeds via Cortrak  Best practice (right click and "Reselect all SmartList  Selections" daily)  Per primary    Cyril Mourning MD. FCCP. [facility] Pager : [phone]  If no response to pager , please call [phone] until 7 pm After 7:00 pm call Elink  [phone]   12/14/2020  Madalee Altmann V. Vassie Loll, MD

## 2020-12-14 NOTE — Progress Notes (Signed)
Nutrition Follow-up  DOCUMENTATION CODES:   Obesity unspecified  INTERVENTION:   Continue tube feeding via Cortrak: - Vital 1.5 @ 60 ml/hr (1440 ml/day) - ProSource TF 45 ml TID - Add free water flushes of 100 ml q 4 hours   Tube feeding regimen provides 2280 kcal, 130 grams of protein, and 1100 ml of H2O.  Total free water with flushes: 1700 ml  NUTRITION DIAGNOSIS:   Inadequate oral intake related to inability to eat as evidenced by NPO status.  Ongoing, being addressed via TF  GOAL:   Patient will meet greater than or equal to 90% of their needs  Met via TF  MONITOR:   Diet advancement, Labs, Weight trends, TF tolerance, I & O's  REASON FOR ASSESSMENT:   Ventilator, Consult Enteral/tube feeding initiation and management  ASSESSMENT:   70 year old male who presented to the ED on 6/09 with SOB. 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 Pt found to have NSTEMI and influenza A. Pt developed R facial droop, R arm drift and hand grip weakness along with R leg weakness and pt likely with CVA s/p tPA.  6/11 - intubated 6/14 - s/p LHC 6/17 - extubated, Cortrak placed (tip gastric)  Discussed pt with RN and during ICU rounds. Pt tolerating tube feeds without issue per RN. Pt remains NPO. SLP working with pt towards diet advancement. Cortrak remains in place with TF infusing as ordered at time of RD visit. Pt did not arouse to RD voice or touch.  RD to add free water flushes, MD to adjust as appropriate.  Admit weight: 99.2 kg Current weight: 100.5 kg  Weight is stable.  Current TF: Vital 1.5 @ 60 ml/hr, ProSource TF 45 ml TID  Medications reviewed and include: SSI q 4 hours, novolog 5 units q 4 hours, lantus 15 units BID, protonix, miralax  Labs reviewed: BUN 57 CBG's: 123-251 x 24 hours  UOP: 1600 ml x 24 hours I/O's: -3.0 L since admit  Diet Order:   Diet Order             Diet NPO time specified  Diet effective now                    EDUCATION NEEDS:   No education needs have been identified at this time  Skin:  Skin Assessment: Reviewed RN Assessment  Last BM:  12/13/20 medium type 4  Height:   Ht Readings from Last 1 Encounters:  12/05/20 5' 9.02" (1.753 m)    Weight:   Wt Readings from Last 1 Encounters:  12/12/20 100.5 kg    BMI:  Body mass index is 32.7 kg/m.  Estimated Nutritional Needs:   Kcal:  2300-2500  Protein:  120-140 grams  Fluid:  >/= 2.0 L    Gustavus Bryant, MS, RD, LDN Inpatient Clinical Dietitian Please see AMiON for contact information.

## 2020-12-14 NOTE — Plan of Care (Signed)
  Problem: Education: Goal: Knowledge of General Education information will improve Description: Including pain rating scale, medication(s)/side effects and non-pharmacologic comfort measures Outcome: Progressing   Problem: Health Behavior/Discharge Planning: Goal: Ability to manage health-related needs will improve Outcome: Progressing   Problem: Clinical Measurements: Goal: Ability to maintain clinical measurements within normal limits will improve Outcome: Progressing Goal: Will remain free from infection Outcome: Progressing Goal: Diagnostic test results will improve Outcome: Progressing Goal: Respiratory complications will improve Outcome: Progressing Goal: Cardiovascular complication will be avoided Outcome: Progressing   Problem: Activity: Goal: Risk for activity intolerance will decrease Outcome: Progressing   Problem: Nutrition: Goal: Adequate nutrition will be maintained Outcome: Progressing   Problem: Coping: Goal: Level of anxiety will decrease Outcome: Progressing   Problem: Elimination: Goal: Will not experience complications related to bowel motility Outcome: Progressing Goal: Will not experience complications related to urinary retention Outcome: Progressing   Problem: Pain Managment: Goal: General experience of comfort will improve Outcome: Progressing   Problem: Safety: Goal: Ability to remain free from injury will improve Outcome: Progressing   Problem: Skin Integrity: Goal: Risk for impaired skin integrity will decrease Outcome: Progressing   Problem: Education: Goal: Knowledge of disease or condition will improve Outcome: Progressing Goal: Knowledge of secondary prevention will improve Outcome: Progressing Goal: Knowledge of patient specific risk factors addressed and post discharge goals established will improve Outcome: Progressing   Problem: Coping: Goal: Will verbalize positive feelings about self Outcome: Progressing Goal: Will  identify appropriate support needs Outcome: Progressing   Problem: Health Behavior/Discharge Planning: Goal: Ability to manage health-related needs will improve Outcome: Progressing   Problem: Self-Care: Goal: Ability to participate in self-care as condition permits will improve Outcome: Progressing Goal: Verbalization of feelings and concerns over difficulty with self-care will improve Outcome: Progressing Goal: Ability to communicate needs accurately will improve Outcome: Progressing   Problem: Nutrition: Goal: Risk of aspiration will decrease Outcome: Progressing Goal: Dietary intake will improve Outcome: Progressing   Problem: Ischemic Stroke/TIA Tissue Perfusion: Goal: Complications of ischemic stroke/TIA will be minimized Outcome: Progressing   

## 2020-12-14 NOTE — Progress Notes (Addendum)
STROKE TEAM PROGRESS NOTE   INTERVAL HISTORY His sister is at the bedside.  Patient was more alert and interactive earlier in side on bedside chair.  Appears to be tired and worn out.  Remains aphasic but can follow simple commands. No acute events overnight.  Vital signs are stable. Neurological examination remains unchanged and stable; he is aphasic and follows limited commands to the RUE (Lifts RUE + wiggles toes to RLE)    PERTINENT IMAGING   CT Head WO IV Contrast 10Jun2022 No acute intracranial abnormality. ASPECTS is 10.   MRI Brain WO IV Contrast 10Jun2022 Diffuse gray matter infarction in the left ACA and MCA territories.   Echocardiogram Complete 10Jun2022 Left ventricular ejection fraction, by estimation, is 55 to 60%. The left ventricle has normal function. Left ventricular diastolic parameters are indeterminate. Elevated left ventricular end-diastolic pressure.  Right ventricular systolic function is normal. The right ventricular size is normal. There is normal pulmonary artery systolic pressure. The estimated right ventricular systolic pressure is 11.0 mmHg.  The mitral valve is normal in structure. No evidence of mitral valve regurgitation. No evidence of mitral stenosis.  The aortic valve was not well visualized. Aortic valve regurgitation is not visualized. No aortic stenosis is present.  The inferior vena cava is normal in size with greater than 50% respiratory variability, suggesting right atrial pressure of 3 mmHg.   VAS US Carotid 10Jun2022 Right ICA 60-79% stenosis Left ICA 80-99% stenosis   CTA Head and Neck W WO IV Contrast 09Jun2022 Negative CTA for emergent large vessel occlusion. Extensive bulky calcified plaque about the left carotid bulb/proximal left ICA with associated severe near occlusive stenosis. Left ICA, MCA, and ACA markedly attenuated but remain patent distally. Short-segment 70% atheromatous stenosis at the proximal cervical right ICA. Severe  left and moderate right vertebral artery origin stenoses.  Vitals:   12/14/20 1200 12/14/20 1300 12/14/20 1400 12/14/20 1500  BP: (!) 87/51 112/62 124/72 (!) 106/57  Pulse: 92 82 96 89  Resp: (!) 31 (!) 33 (!) 27 (!) 34  Temp: 99.86 F (37.7 C) 99.86 F (37.7 C) 99.86 F (37.7 C) 100.04 F (37.8 C)  TempSrc: Bladder   Bladder  SpO2: 92% 90% 93% 93%  Weight:      Height:        CBC:  Recent Labs  Lab 12/13/20 0342 12/14/20 0229  WBC 6.0 6.7  HGB 10.7* 10.5*  HCT 35.3* 35.4*  MCV 93.4 93.2  PLT 358 391    Basic Metabolic Panel:  Recent Labs  Lab 12/08/20 1700 12/09/20 0317 12/10/20 0138 12/10/20 0636 12/11/20 0137 12/13/20 0342 12/14/20 0229  NA  --  142   < > 142   < > 142 144  K  --  3.9   < > 3.8   < > 4.1 4.0  CL  --  106   < > 103   < > 100 103  CO2  --  28   < > 30   < > 32 33*  GLUCOSE  --  170*   < > 173*   < > 217* 230*  BUN  --  24*   < > 27*   < > 35* 57*  CREATININE  --  0.66   < > 0.74   < > 0.76 1.08  CALCIUM  --  7.8*   < > 8.3*   < > 8.7* 8.4*  MG 2.5* 2.4  --  2.2  --   --   --  PHOS 3.0 3.1  --   --   --   --   --    < > = values in this interval not displayed.    Lipid Panel: No results for input(s): CHOL, TRIG, HDL, CHOLHDL, VLDL, LDLCALC in the last 168 hours. HgbA1c:  Lab Results  Component Value Date   HGBA1C 6.2 (H) 12/04/2020   Urine Drug Screen: No results found for: LABOPIA, COCAINSCRNUR, LABBENZ, AMPHETMU, THCU, LABBARB  Alcohol Level No results found for: [facility]  EXAMINATION  General exam   HEENT-  Normocephalic, no lesions, without obvious abnormality.  Normal external eye and conjunctiva.  Dry mucous membranes Cardiovascular- RRR  Lungs-Breathing room air, occasionally labored, occasional wheezing Musculoskeletal-no obvious deformity or swelling Skin-warm and dry, intact  Neurologic Exam: General: Pleasant middle-age male not in distress, laying in bed Mental Status: Is drowsy but can be aroused respond to  stimulation.  Globally aphasic. Able to follow few simple commands speech mostly nonverbal with tries to speak. Cranial Nerves: II: Pupils equal, round III,IV, VI: ptosis not present, left gaze preference, does not cross midline V,VII: subtle right facial droop VIII: hearing normal bilaterally Sensory: Grimaces to pain on the right Motor: Moves left upper and lower extremities spontaneously against gravity; 0/5 on the right. Tone and bulk normal; no atrophy noted Gait: Unable to assess   ASSESSMENT/PLAN [name] is a 70 y.o. male with a past medical history significant for, HLD, HTN and lumbar stenosis who presented to [facility] with SOB and diagnoses with influenza A. On the day of admission he developed mild R facial droop, R arm drift and hand grip weakness along with R leg weakness. Code stroke was initiated and he received TPA.   #Left MCA/ACA territory stroke IV tPA given 09 Jun secondary to symptomatic high-grade proximal left carotid stenosis - MRI Brain: Left ACA and MCA territory stroke - CTA Head and Neck was pertinent anterior circulation stenosis w/ LICA showing severe stenosis/near occlusion. RICA was also notably stenosed. CUS revealed RICA w/60-79 % stenosis, LICA with 80-99 % stenosis - Echo: EF 55-60%, LA normal in size - Stroke labs: LDL 55, Hemoglobin A1C 6.2 - Stroke etiology is likely large vessel atherosclerosis - Continue DAPT for secondary stroke prevention with Aspirin 81 mg + Plavix 75 mg QD   # LICA stenosis - Neuro IR consulted for LICA stenosis on 6/13 for intervention - Currently neuro IR recommend outpt follow up for left ICA stenting consideration     - Agree. Would recommend watch and wait re: stent placement. More meaningful neurological improvement may be appropriate before procedure.   # NSTEMI [name] has a known history of cardiomyopathy.Cardiology consulted for New ST depression in V6 and worsening T wave inversion in inferior leads; likely  demand ischemia in the setting of known coronary artery disease (18 stents) and worsening hypoxia and acidosis. He underwent a cardiac cath on 12/08/20 that showed multivessel disease - Recommending medical treatment for now and state that distal lesions in RCA can be stented if patient has ischemia and has improved physical condition from a neurological standpoint. - Continuing to follow cardiology guidance   # DVT Prophylaxis - Lovenox 50mg  QD - SCDs  # Essential Hypertension (History) - Home medication: HCTZ 25 QD, Atenolol 100 mg QD, Lisinopril 20 mg QD at home - SBP goal 90-140/50-85, currently pressures are trending 120s-140s/60s-70s - Continue Metoprolol 25mg  q12h, losartan 25mg  daily   # Hyperlipidemia - LDL goal is < 70. LDL is  55 and is at goal. - Continue Atorvastatin 80 mg and Zetia 10mg  daily   #Acute Respiratory Failure/ Influenza Pneumonia/Left Pleural Effusion Prior to admission Mr. Aymond reported having SOB and a cough. He was hypoxic in the ED and was found to be Influenza A positive - Intubated on 6/11 for increased WOB and hypoxia -> extubated 6/17 -> doing well on room air - CXR bilateral infiltrates and pulmonary edema - Albuterol nebs prn, Brovana 7/17 BID, pulmicort neb BID, duoneb 4x daily, robitussin PRN - CCM on board   # DMII DMII diagnosis on admission - Hemoglobin A1C 6.2, goal <7 - CBGs in the 200s  - Continue SSI   #Dysphagia In the setting of L MCA stroke. SLP assessed him on 6/10, noted dysphagia and recommended NPO.  - Extubated 6/17 -> needs cortrak with TF   #Urinary retention - Foley removed 16Jun - Voiding trial post extubation, unclear if completed  # Influenza A Pneumonia Screened for influenza given recent travel to [place] and ongoing SOB prior to admission. Noted to be febrile and hypoxic this admission. - Tamiflu 75 BID for 5 days- 12/03/20 to 12/07/20 - Treated with Azithromycin 6/11 to 6/14 - Ceftriaxone 2 g Q24 hr started  6/11-6/15   #Fever Afebrile the last 48h, 98-100 deg F - Trending CBC, so far no leukocytosis. - Increased sputum- culture w/moderate WBC few gram positive and negative rods - CXR and UA ordered 6/14 as a part of fever work up. 6/14 CXR with slight increase in the degree of left basilar consolidation with associated effusion. 6/14 UA negative for UTI.  #Dispo - Considering Mr. Hur improvements (extubation, remaining so), improved neurological function, he can move to the neuro floor for continued care. - When medically appropriate. PT/OT suggest [facility]  Hospital day # 11  Patient remains aphasic with  dense right hemiplegia on exam.  He does have symptomatic severe left carotid stenosis but revascularization will have to wait till patient shows at least modest improvement in rehab.  He also has significant underlying coronary artery disease which also may need cardiac cath in the future.  Recommend transfer outside the ICU to neurology stepdown bed.  Mobilize out of bed.  Avoid hypotension.  Continue ongoing therapies.  Long discussion with patient's sister at the bedside  .  Greater than 50% time during this 35-minute visit was spent on counseling and coordination of care and discussion with care team.   [facility], MD Medical Director [facility] Pager: [phone] 12/14/2020 3:42 PM

## 2020-12-14 NOTE — Consult Note (Signed)
Subjective:  No complaints.  Remains aphasic with right sided paralysis  Objective:  Vital Signs in the last 24 hours: Temp:  [99.32 F (37.4 C)-101.12 F (38.4 C)] 99.86 F (37.7 C) (06/20 1400) Pulse Rate:  [82-110] 96 (06/20 1400) Resp:  [20-38] 27 (06/20 1400) BP: (87-134)/(51-97) 124/72 (06/20 1400) SpO2:  [85 %-96 %] 93 % (06/20 1400) FiO2 (%):  [40 %] 40 % (06/20 1136)  Intake/Output from previous day: 06/19 0701 - 06/20 0700 In: 900 [NG/GT:900] Out: 1850 [Urine:1600; Emesis/NG output:250] Intake/Output from this shift: Total I/O In: 420 [NG/GT:420] Out: 350 [Urine:350]  Physical Exam: Neck: no adenopathy, no carotid bruit, no JVD, and supple, symmetrical, trachea midline Lungs: decreased breath sounds at bases with occasional rhonchi noted Heart: regular rate and rhythm, S1, S2 normal, and soft systolic murmur noted Abdomen: soft, non-tender; bowel sounds normal; no masses,  no organomegaly Extremities: extremities normal, atraumatic, no cyanosis or edema  Lab Results: Recent Labs    12/13/20 0342 12/14/20 0229  WBC 6.0 6.7  HGB 10.7* 10.5*  PLT 358 391   Recent Labs    12/13/20 0342 12/14/20 0229  NA 142 144  K 4.1 4.0  CL 100 103  CO2 32 33*  GLUCOSE 217* 230*  BUN 35* 57*  CREATININE 0.76 1.08   No results for input(s): TROPONINI in the last 72 hours.  Invalid input(s): CK, MB Hepatic Function Panel No results for input(s): PROT, ALBUMIN, AST, ALT, ALKPHOS, BILITOT, BILIDIR, IBILI in the last 72 hours. No results for input(s): CHOL in the last 72 hours. No results for input(s): PROTIME in the last 72 hours.  Imaging: Imaging results have been reviewed  Cardiac Studies:  Assessment/Plan:  Acute NSTEMI status post left cardiac catheterization  Multivessel CAD with multivessel PCI requiring 18 stents in remote past in [place] S/P CABG the saphenous vein graft occlusion culprit for his non-STEMI, LIMA not visualized.   Severe  cerebrovascular disease Status post Acute respirator failure with hypoxia Acute left cerebral infarct with right sided hemiplegia status post TPA Anemia of blood loss Influenza A pneumonia Hypertension. Diabetes mellitus. Hyperlipidemia. History of spinal stenosis. Plan Continue present management per CCM/neurology. I will sign off.  Please call if needed. No further cardiac interventions this admission.  Discussed with family and agrees.  LOS: 11 days    Rinaldo Cloud 12/14/2020, 3:06 PM

## 2020-12-15 DIAGNOSIS — I63232 Cerebral infarction due to unspecified occlusion or stenosis of left carotid arteries: Secondary | ICD-10-CM | POA: Diagnosis not present

## 2020-12-15 DIAGNOSIS — J9601 Acute respiratory failure with hypoxia: Secondary | ICD-10-CM | POA: Diagnosis not present

## 2020-12-15 LAB — URINALYSIS, ROUTINE W REFLEX MICROSCOPIC
Bacteria, UA: NONE SEEN
Bilirubin Urine: NEGATIVE
Glucose, UA: 50 mg/dL — AB
Ketones, ur: NEGATIVE mg/dL
Leukocytes,Ua: NEGATIVE
Nitrite: NEGATIVE
Protein, ur: >=300 mg/dL — AB
RBC / HPF: >50 RBC/hpf — ABNORMAL HIGH (ref 0–5)
Specific Gravity, Urine: 1.023 (ref 1.005–1.030)
pH: 6 (ref 5.0–8.0)

## 2020-12-15 LAB — BASIC METABOLIC PANEL
Anion gap: 7 (ref 5–15)
BUN: 59 mg/dL — ABNORMAL HIGH (ref 8–23)
CO2: 32 mmol/L (ref 22–32)
Calcium: 8.7 mg/dL — ABNORMAL LOW (ref 8.9–10.3)
Chloride: 103 mmol/L (ref 98–111)
Creatinine, Ser: 0.84 mg/dL (ref 0.61–1.24)
GFR, Estimated: >60 mL/min (ref >60)
Glucose, Bld: 249 mg/dL — ABNORMAL HIGH (ref 70–99)
Potassium: 4.2 mmol/L (ref 3.5–5.1)
Sodium: 142 mmol/L (ref 135–145)

## 2020-12-15 LAB — GLUCOSE, CAPILLARY
Glucose-Capillary: 190 mg/dL — ABNORMAL HIGH (ref 70–99)
Glucose-Capillary: 201 mg/dL — ABNORMAL HIGH (ref 70–99)
Glucose-Capillary: 198 mg/dL — ABNORMAL HIGH (ref 70–99)
Glucose-Capillary: 150 mg/dL — ABNORMAL HIGH (ref 70–99)
Glucose-Capillary: 148 mg/dL — ABNORMAL HIGH (ref 70–99)
Glucose-Capillary: 201 mg/dL — ABNORMAL HIGH (ref 70–99)

## 2020-12-15 LAB — CBC
HCT: 36.6 % — ABNORMAL LOW (ref 39.0–52.0)
Hemoglobin: 11 g/dL — ABNORMAL LOW (ref 13.0–17.0)
MCH: 27.9 pg (ref 26.0–34.0)
MCHC: 30.1 g/dL (ref 30.0–36.0)
MCV: 92.9 fL (ref 80.0–100.0)
Platelets: 415 10*3/uL — ABNORMAL HIGH (ref 150–400)
RBC: 3.94 MIL/uL — ABNORMAL LOW (ref 4.22–5.81)
RDW: 14.9 % (ref 11.5–15.5)
WBC: 6.1 10*3/uL (ref 4.0–10.5)
nRBC: 0 % (ref 0.0–0.2)

## 2020-12-15 MED ORDER — INSULIN ASPART 100 UNIT/ML IJ SOLN
8.0000 [IU] | INTRAMUSCULAR | Status: DC
  Administered 2020-12-15 – 2020-12-26 (×58): 8 [IU] via SUBCUTANEOUS

## 2020-12-15 MED ORDER — ORAL CARE MOUTH RINSE: 15.0000 mL | Freq: Two times a day (BID) | OROMUCOSAL | Status: DC

## 2020-12-15 MED ORDER — CHLORHEXIDINE GLUCONATE 0.12 % MT SOLN: 15.0000 mL | Freq: Two times a day (BID) | OROMUCOSAL | Status: DC

## 2020-12-15 MED ORDER — WHITE PETROLATUM EX OINT
TOPICAL_OINTMENT | CUTANEOUS | Status: AC
  Administered 2020-12-15: 1
  Filled 2020-12-15: qty 28.35

## 2020-12-15 MED ORDER — ORAL CARE MOUTH RINSE
15.0000 mL | Freq: Two times a day (BID) | OROMUCOSAL | Status: DC
  Administered 2020-12-15 – 2021-01-11 (×56): 15 mL via OROMUCOSAL

## 2020-12-15 MED ORDER — CHLORHEXIDINE GLUCONATE 0.12 % MT SOLN
15.0000 mL | Freq: Two times a day (BID) | OROMUCOSAL | Status: DC
  Administered 2020-12-15 – 2021-01-11 (×55): 15 mL via OROMUCOSAL
  Filled 2020-12-15 (×48): qty 15

## 2020-12-15 NOTE — Progress Notes (Signed)
Inpatient Diabetes Program Recommendations  AACE/ADA: New Consensus Statement on Inpatient Glycemic Control   Target Ranges:  Prepandial:   less than 140 mg/dL      Peak postprandial:   less than 180 mg/dL (1-2 hours)      Critically ill patients:  140 - 180 mg/dL   Results for KNOX, CERVI (MRN [id number]) as of 12/15/2020 08:53  Ref. Range 12/14/2020 07:18 12/14/2020 11:28 12/14/2020 15:08 12/14/2020 19:28 12/14/2020 23:44 12/15/2020 03:32 12/15/2020 07:34  Glucose-Capillary Latest Ref Range: 70 - 99 mg/dL 694 (H) 854 (H) 627 (H) 197 (H) 268 (H) 190 (H) 201 (H)    Review of Glycemic Control  Current orders for Inpatient glycemic control: Lantus 15 units BID, Novolog 5 units Q4H, Novolog 0-15 units Q4H  Inpatient Diabetes Program Recommendations:    Insulin: Please consider increasing tube feeding coverage to Novolog 8 units Q4H.  Everlena Cooper, RN, MSN, CDE Diabetes Coordinator Inpatient Diabetes Program [phone] (Team Pager from 8am to 5pm)

## 2020-12-15 NOTE — Progress Notes (Signed)
Occupational Therapy Treatment Patient Details Name: Brandon Daniel MRN: [id number] DOB: 02-07-1951 Today's Date: 12/15/2020    History of present illness Mr. Trayven Lumadue is a 70 y.o. male admitted 6/9 that  presented with SOB, was found to be Influenza A + and later on day of admission developed mild R facial droop, R arm drift and hand grip weakness along with R leg weakness. Left MCA and ACA CVA. Pt received tPa. VDRF 6/11-6/15. PMH: CAD, HTN, lumbar stenosis, HLD   OT comments  OT treatment session with focus on command following in prep for ADLs, RUE NMR, and LUE strengthening. Patient with continued difficulty keeping eyes open without max cueing. R eye ptosis and disconjugate gaze also noted. Patient initially following 1-step verbal commands with <25% accuracy but improved to 50% accuracy with increased level of arousal. Patient also with increased accuracy of head nods/shakes shaking his head "no" when asked if Garnet Koyanagi was the current president and "yes" when asked if "Duane Boston" was the current president. Patient also shook head "no" when asked if the current season was winder and "yes" when asked if the current season was summer. OT will continue to follow acutely.   Follow Up Recommendations  CIR    Equipment Recommendations  None recommended by OT    Recommendations for Other Services Rehab consult    Precautions / Restrictions Precautions Precautions: Fall Restrictions Weight Bearing Restrictions: No       Mobility Bed Mobility Overal bed mobility: Needs Assistance                  Transfers                 General transfer comment: Deferred 2/2 decreased level of arousal and absence of +2 assist.    Balance                                           ADL either performed or assessed with clinical judgement   ADL Overall ADL's : Needs assistance/impaired     Grooming: Wash/dry face;Moderate assistance;Bed level Grooming  Details (indicate cue type and reason): Able to grasp washcloth from therapist and wash face. Cues for termination of task.             Lower Body Dressing: Total assistance;Bed level Lower Body Dressing Details (indicate cue type and reason): To don footwear in supine.               General ADL Comments: pt unable to maintain arousal fully to perform ADLs     Vision       Perception     Praxis      Cognition Arousal/Alertness: Lethargic;Awake/alert Behavior During Therapy: Flat affect Overall Cognitive Status: Impaired/Different from baseline Area of Impairment: Attention;Following commands;Problem solving                   Current Attention Level: Focused   Following Commands: Follows one step commands inconsistently;Follows one step commands with increased time     Problem Solving: Slow processing;Decreased initiation;Difficulty sequencing;Requires verbal cues;Requires tactile cues General Comments: Lethargic with difficulty keeping eyes open. With changes to environment including turning on overhead lights, patient able to keeps eyes open for only brief periods of time with max cues. Follows 1-step verbal commands with intermittent consistency.        Exercises Exercises: General Upper Extremity;General  Lower Extremity General Exercises - Upper Extremity Shoulder Flexion: PROM;AROM;Both (PROM on R; AROM on L) Elbow Flexion: PROM;AROM;Both (PROM on R; AROM on L) Elbow Extension: PROM;AROM;Both;10 reps (PROM on R; AROM on L) Digit Composite Flexion: PROM;AROM;Both (PROM on R; AROM on L) General Exercises - Lower Extremity Straight Leg Raises: AROM;10 reps;Left;Supine Hip Flexion/Marching: AROM;Left;10 reps;Supine Toe Raises: AROM;Left;10 reps;Supine   Shoulder Instructions       General Comments      Pertinent Vitals/ Pain       Pain Assessment: Faces Faces Pain Scale: No hurt Pain Intervention(s): Monitored during session  Home Living                                           Prior Functioning/Environment              Frequency  Min 2X/week        Progress Toward Goals  OT Goals(current goals can now be found in the care plan section)  Progress towards OT goals: Progressing toward goals (slowly)  Acute Rehab OT Goals Patient Stated Goal: to get better OT Goal Formulation: Patient unable to participate in goal setting Time For Goal Achievement: 12/25/20 Potential to Achieve Goals: Good ADL Goals Pt Will Perform Grooming: with min assist;sitting Pt Will Transfer to Toilet: with max assist;squat pivot transfer;bedside commode Additional ADL Goal #1: Pt will maintain EOB sitting with minA while performing basic ADL task x 10 mins Additional ADL Goal #2: Pt will locate ADL items on Rt with min cues Additional ADL Goal #3: Pt will sustain attention to simple ADL task x 5 mins with min cues.  Plan Discharge plan remains appropriate;Frequency remains appropriate    Co-evaluation                 AM-PAC OT "6 Clicks" Daily Activity     Outcome Measure   Help from another person eating meals?: Total Help from another person taking care of personal grooming?: A Lot Help from another person toileting, which includes using toliet, bedpan, or urinal?: Total Help from another person bathing (including washing, rinsing, drying)?: Total Help from another person to put on and taking off regular upper body clothing?: Total Help from another person to put on and taking off regular lower body clothing?: Total 6 Click Score: 7    End of Session    OT Visit Diagnosis: Unsteadiness on feet (R26.81);Hemiplegia and hemiparesis;Cognitive communication deficit (R41.841) Symptoms and signs involving cognitive functions: Cerebral infarction Hemiplegia - Right/Left: Right Hemiplegia - dominant/non-dominant: Dominant Hemiplegia - caused by: Cerebral infarction   Activity Tolerance Patient tolerated  treatment well;Patient limited by lethargy   Patient Left in bed;with bed alarm set;with call bell/phone within reach   Nurse Communication Other (comment) (Response to treatment)        Time: 5397-6734 OT Time Calculation (min): 18 min  Charges: OT General Charges $OT Visit: 1 Visit OT Treatments $Neuromuscular Re-education: 8-22 mins  Lukas Pelcher H. OTR/L Supplemental OT, Department of rehab services [phone]   Mckenna Gamm R H. 12/15/2020, 2:56 PM

## 2020-12-15 NOTE — Progress Notes (Signed)
NAME:  Brandon Daniel, MRN:  [id number], DOB:  1950-08-21, LOS: 12 ADMISSION DATE:  12/03/2020, CONSULTATION DATE:  12/04/20 REFERRING MD:  Derry Lory, CHIEF COMPLAINT:  weakness   History of Present Illness:  70 year old man with hx of CAD, HTN, HLD admitted for SOB from influenza A on 12/03/20.  Developed R face and R sided weakness this evening and received TPA after normal head CT.  After this he developed some worsening responsiveness so another CT ordered which looks fine. PCCM consulted to monitor airway.  Pertinent  Medical History  Asthma/COPD overlad CAD HLD HTN  Significant Hospital Events: Including procedures, antibiotic start and stop dates in addition to other pertinent events   6/9 admitted 6/9 evening code stroke, tpa, PCCM eval 6/10 Intubated for resp distress 6/11 Lt IJ CVL placed, levo for hypotension, started heparin drip for NSTEMI and cardiology consulted 6/14 Left Heart Cath >> multiple chronic total or near total occlusions. 6/15  started lasix to help with vent liberation 6/16 diuresed 6/17 extubated, diuresed, 5L O2 needed   Interim History / Subjective:   Remains on 5 L nasal cannula. Low-grade febrile this morning   Objective   Blood pressure 139/82, pulse 86, temperature 98.3 F (36.8 C), temperature source Axillary, resp. rate (!) 28, height 5' 9.02" (1.753 m), weight 94.2 kg, SpO2 96 %.    FiO2 (%):  [40 %] 40 %   Intake/Output Summary (Last 24 hours) at 12/15/2020 1039 Last data filed at 12/15/2020 0800 Gross per 24 hour  Intake 1625 ml  Output 1420 ml  Net 205 ml    Filed Weights   12/11/20 0500 12/12/20 0500 12/15/20 0425  Weight: 100.8 kg 100.5 kg 94.2 kg    Examination: Gen:      No acute distress  Patient alert in bed HEENT: ncat,sclera anicteric,  Neck:     No masses; no thyromegaly Lungs:   No accessory muscle use, good cough, no rhonchi CV:         Regular rate and rhythm; no murmurs Abd:      + bowel sounds; soft,  non-tender; no palpable masses, no distension Skin:      Warm and dry; no rash Neuro: Aphasic, right dense hemiplegia, follows commands, good strength on left   Labs/imaging that I havepersonally reviewed  (right click and "Reselect all SmartList Selections" daily)  Chest x-ray 6/19 independently reviewed shows left basal consolidation and right basilar atelectasis  Labs show normal electrolytes, improved hyperglycemia, no leukocytosis  Resolved Hospital Problem list   AKI  Assessment & Plan:  Acute respiratory failure due to inability to clear secretions, acute pulmonary edema, Influenza A pna --5L [place], possible COPD per nephew --Wean O2 goal sat 88% -Tracheobronchial toilet, added hypertonic saline nebs, chest PT via bed, flutter valve -DC droplet precautions  Fever, no leukocytosis -Have to consider noninfectious causes -DC Foley if possible, note that this was attempted 6/16  Left ACA, MCA stroke s/p tPA --TTE ok, no Afib seen, ICA stenosis to be dealt with OP --Vertebral stensois to be dealt with OP --statin, ASA, plavix -- Failed swallow again 6/20 , waiting to see if improvement hopefully can avoid PEG -Good candidate for CIR   Non ST elevation MI, history of cardiomyopathy ischemic s/p CABG, acute pulmonary edema --LHC revealed restenosed grafts and multivessel disease -Medical management per cardiology  Low hemoglobin.  No evidence of active bleed , stable Likely secondary to critical illness   New onset diabetes , uncontrolled HbA1C  6.2 Increased  Lantus 15 Q 12H Increase  TF coverage to 8u q 4h SSI   Nutrition Tube feeds via Cortrak  Best practice (right click and "Reselect all SmartList Selections" daily)  Per primary  PCCM will be available as needed  Cyril Mourning MD. FCCP. [facility] Pager : [phone]  If no response to pager , please call [phone] until 7 pm After 7:00 pm call Elink  [phone]   12/15/2020  Lexxie Winberg  V. Vassie Loll, MD

## 2020-12-15 NOTE — Progress Notes (Signed)
STROKE TEAM PROGRESS NOTE   INTERVAL HISTORY His sisters are  at the bedside.  Patient remains drowsy but can be aroused with some stimulation.  Appears to be tired and worn out.  Remains aphasic but can follow simple commands. No acute events overnight.  Vital signs are stable. Neurological examination remains unchanged and stable; he is aphasic and follows limited commands to the RUE (Lifts RUE + wiggles toes to RLE)  PCCM team planning to sign off and see as needed.  PERTINENT IMAGING   CT Head WO IV Contrast 10Jun2022 No acute intracranial abnormality. ASPECTS is 10.   MRI Brain WO IV Contrast 10Jun2022 Diffuse gray matter infarction in the left ACA and MCA territories.   Echocardiogram Complete 10Jun2022 Left ventricular ejection fraction, by estimation, is 55 to 60%. The left ventricle has normal function. Left ventricular diastolic parameters are indeterminate. Elevated left ventricular end-diastolic pressure.  Right ventricular systolic function is normal. The right ventricular size is normal. There is normal pulmonary artery systolic pressure. The estimated right ventricular systolic pressure is 11.0 mmHg.  The mitral valve is normal in structure. No evidence of mitral valve regurgitation. No evidence of mitral stenosis.  The aortic valve was not well visualized. Aortic valve regurgitation is not visualized. No aortic stenosis is present.  The inferior vena cava is normal in size with greater than 50% respiratory variability, suggesting right atrial pressure of 3 mmHg.   VAS US Carotid 10Jun2022 Right ICA 60-79% stenosis Left ICA 80-99% stenosis   CTA Head and Neck W WO IV Contrast 09Jun2022 Negative CTA for emergent large vessel occlusion. Extensive bulky calcified plaque about the left carotid bulb/proximal left ICA with associated severe near occlusive stenosis. Left ICA, MCA, and ACA markedly attenuated but remain patent distally. Short-segment 70% atheromatous stenosis at  the proximal cervical right ICA. Severe left and moderate right vertebral artery origin stenoses.  Vitals:   12/15/20 0722 12/15/20 0736 12/15/20 1048 12/15/20 1133  BP:   130/78   Pulse:   (!) 112   Resp:      Temp:  98.3 F (36.8 C)    TempSrc:  Axillary    SpO2: 96%   97%  Weight:      Height:        CBC:  Recent Labs  Lab 12/14/20 0229 12/15/20 0132  WBC 6.7 6.1  HGB 10.5* 11.0*  HCT 35.4* 36.6*  MCV 93.2 92.9  PLT 391 415*    Basic Metabolic Panel:  Recent Labs  Lab 12/08/20 1700 12/09/20 0317 12/10/20 0138 12/10/20 0636 12/11/20 0137 12/14/20 0229 12/15/20 0132  NA  --  142   < > 142   < > 144 142  K  --  3.9   < > 3.8   < > 4.0 4.2  CL  --  106   < > 103   < > 103 103  CO2  --  28   < > 30   < > 33* 32  GLUCOSE  --  170*   < > 173*   < > 230* 249*  BUN  --  24*   < > 27*   < > 57* 59*  CREATININE  --  0.66   < > 0.74   < > 1.08 0.84  CALCIUM  --  7.8*   < > 8.3*   < > 8.4* 8.7*  MG 2.5* 2.4  --  2.2  --   --   --   PHOS  3.0 3.1  --   --   --   --   --    < > = values in this interval not displayed.    Lipid Panel: No results for input(s): CHOL, TRIG, HDL, CHOLHDL, VLDL, LDLCALC in the last 168 hours. HgbA1c:  Lab Results  Component Value Date   HGBA1C 6.2 (H) 12/04/2020   Urine Drug Screen: No results found for: LABOPIA, COCAINSCRNUR, LABBENZ, AMPHETMU, THCU, LABBARB  Alcohol Level No results found for: [facility]  EXAMINATION  General exam   HEENT-  Normocephalic, no lesions, without obvious abnormality.  Normal external eye and conjunctiva.  Dry mucous membranes Cardiovascular- RRR  Lungs-Breathing room air, occasionally labored, occasional wheezing Musculoskeletal-no obvious deformity or swelling Skin-warm and dry, intact  Neurologic Exam: General: Pleasant middle-age male not in distress, laying in bed Mental Status: Is drowsy but can be aroused respond to stimulation.  Globally aphasic. Able to follow few simple commands speech mostly  nonverbal with tries to speak. Cranial Nerves: II: Pupils equal, round III,IV, VI: ptosis not present, left gaze preference, does not cross midline V,VII: subtle right facial droop VIII: hearing normal bilaterally Sensory: Grimaces to pain on the right Motor: Moves left upper and lower extremities spontaneously against gravity; 0/5 on the right. Tone and bulk normal; no atrophy noted Gait: Unable to assess   ASSESSMENT/PLAN Mr. Latavius Capizzi is a 70 y.o. male with a past medical history significant for, HLD, HTN and lumbar stenosis who presented to [facility] with SOB and diagnoses with influenza A. On the day of admission he developed mild R facial droop, R arm drift and hand grip weakness along with R leg weakness. Code stroke was initiated and he received TPA.   #Left MCA/ACA territory stroke IV tPA given 09 Jun secondary to symptomatic high-grade proximal left carotid stenosis - MRI Brain: Left ACA and MCA territory stroke - CTA Head and Neck was pertinent anterior circulation stenosis w/ LICA showing severe stenosis/near occlusion. RICA was also notably stenosed. CUS revealed RICA w/60-79 % stenosis, LICA with 80-99 % stenosis - Echo: EF 55-60%, LA normal in size - Stroke labs: LDL 55, Hemoglobin A1C 6.2 - Stroke etiology is likely large vessel atherosclerosis - Continue DAPT for secondary stroke prevention with Aspirin 81 mg + Plavix 75 mg QD   # LICA stenosis - Neuro IR consulted for LICA stenosis on 6/13 for intervention - Currently neuro IR recommend outpt follow up for left ICA stenting consideration     - Agree. Would recommend watch and wait re: stent placement. More meaningful neurological improvement may be appropriate before procedure.   # NSTEMI Mr. Hensen has a known history of cardiomyopathy.Cardiology consulted for New ST depression in V6 and worsening T wave inversion in inferior leads; likely demand ischemia in the setting of known coronary artery disease (18 stents) and  worsening hypoxia and acidosis. He underwent a cardiac cath on 12/08/20 that showed multivessel disease - Recommending medical treatment for now and state that distal lesions in RCA can be stented if patient has ischemia and has improved physical condition from a neurological standpoint. - Continuing to follow cardiology guidance   # DVT Prophylaxis - Lovenox 50mg  QD - SCDs  # Essential Hypertension (History) - Home medication: HCTZ 25 QD, Atenolol 100 mg QD, Lisinopril 20 mg QD at home - SBP goal 90-140/50-85, currently pressures are trending 120s-140s/60s-70s - Continue Metoprolol 25mg  q12h, losartan 25mg  daily   # Hyperlipidemia - LDL goal is < 70. LDL is 55  and is at goal. - Continue Atorvastatin 80 mg and Zetia 10mg  daily   #Acute Respiratory Failure/ Influenza Pneumonia/Left Pleural Effusion Prior to admission Mr. Hineman reported having SOB and a cough. He was hypoxic in the ED and was found to be Influenza A positive - Intubated on 6/11 for increased WOB and hypoxia -> extubated 6/17 -> doing well on room air - CXR bilateral infiltrates and pulmonary edema - Albuterol nebs prn, Brovana 7/17 BID, pulmicort neb BID, duoneb 4x daily, robitussin PRN - CCM on board   # DMII DMII diagnosis on admission - Hemoglobin A1C 6.2, goal <7 - CBGs in the 200s  - Continue SSI   #Dysphagia In the setting of L MCA stroke. SLP assessed him on 6/10, noted dysphagia and recommended NPO.  - Extubated 6/17 -> needs cortrak with TF   #Urinary retention - Foley removed 16Jun - Voiding trial post extubation, unclear if completed  # Influenza A Pneumonia Screened for influenza given recent travel to [place] and ongoing SOB prior to admission. Noted to be febrile and hypoxic this admission. - Tamiflu 75 BID for 5 days- 12/03/20 to 12/07/20 - Treated with Azithromycin 6/11 to 6/14 - Ceftriaxone 2 g Q24 hr started 6/11-6/15   #Fever Afebrile the last 48h, 98-100 deg F - Trending CBC, so far no  leukocytosis. - Increased sputum- culture w/moderate WBC few gram positive and negative rods - CXR and UA ordered 6/14 as a part of fever work up. 6/14 CXR with slight increase in the degree of left basilar consolidation with associated effusion. 6/14 UA negative for UTI.  #Dispo - Considering Mr. Tauzin improvements (extubation, remaining so), improved neurological function, he can move to the neuro floor for continued care. - When medically appropriate. PT/OT suggest [facility]  Hospital day # 12  Patient remains aphasic with  dense right hemiplegia on exam.  He does have symptomatic severe left carotid stenosis but revascularization will have to wait till patient shows at least modest improvement in rehab.  He also has significant underlying coronary artery disease which also may need cardiac cath in the future.  Recommend transfer outside the ICU to neurology stepdown bed.  Mobilize out of bed.  Avoid hypotension.  Continue ongoing therapies.  PCCM will plan to sign off the last medical hospitalist team to see in take over care.  Long discussion with patient's sisters at the bedside  .  Greater than 50% time during this 25-minute visit was spent on counseling and coordination of care and discussion with care team.   [facility], MD Medical Director [facility] Pager: [phone] 12/15/2020 2:18 PM

## 2020-12-16 ENCOUNTER — Inpatient Hospital Stay (HOSPITAL_COMMUNITY): Payer: Medicare (Managed Care)

## 2020-12-16 DIAGNOSIS — J9601 Acute respiratory failure with hypoxia: Secondary | ICD-10-CM

## 2020-12-16 DIAGNOSIS — I25119 Atherosclerotic heart disease of native coronary artery with unspecified angina pectoris: Secondary | ICD-10-CM | POA: Diagnosis not present

## 2020-12-16 DIAGNOSIS — J101 Influenza due to other identified influenza virus with other respiratory manifestations: Secondary | ICD-10-CM | POA: Diagnosis not present

## 2020-12-16 DIAGNOSIS — I63232 Cerebral infarction due to unspecified occlusion or stenosis of left carotid arteries: Secondary | ICD-10-CM | POA: Diagnosis not present

## 2020-12-16 DIAGNOSIS — I214 Non-ST elevation (NSTEMI) myocardial infarction: Secondary | ICD-10-CM | POA: Diagnosis not present

## 2020-12-16 LAB — GLUCOSE, CAPILLARY
Glucose-Capillary: 153 mg/dL — ABNORMAL HIGH (ref 70–99)
Glucose-Capillary: 161 mg/dL — ABNORMAL HIGH (ref 70–99)
Glucose-Capillary: 167 mg/dL — ABNORMAL HIGH (ref 70–99)
Glucose-Capillary: 172 mg/dL — ABNORMAL HIGH (ref 70–99)
Glucose-Capillary: 197 mg/dL — ABNORMAL HIGH (ref 70–99)
Glucose-Capillary: 214 mg/dL — ABNORMAL HIGH (ref 70–99)

## 2020-12-16 LAB — BASIC METABOLIC PANEL
Anion gap: 8 (ref 5–15)
BUN: 72 mg/dL — ABNORMAL HIGH (ref 8–23)
CO2: 29 mmol/L (ref 22–32)
Calcium: 8.7 mg/dL — ABNORMAL LOW (ref 8.9–10.3)
Chloride: 108 mmol/L (ref 98–111)
Creatinine, Ser: 1.23 mg/dL (ref 0.61–1.24)
GFR, Estimated: >60 mL/min (ref >60)
Glucose, Bld: 189 mg/dL — ABNORMAL HIGH (ref 70–99)
Potassium: 4.4 mmol/L (ref 3.5–5.1)
Sodium: 145 mmol/L (ref 135–145)

## 2020-12-16 MED ORDER — DOXAZOSIN MESYLATE 2 MG PO TABS
2.0000 mg | ORAL_TABLET | Freq: Every day | ORAL | Status: DC
  Administered 2020-12-17 – 2020-12-25 (×9): 2 mg
  Filled 2020-12-16 (×11): qty 1

## 2020-12-16 MED ORDER — FREE WATER
200.0000 mL | Status: DC
  Administered 2020-12-16 – 2020-12-26 (×53): 200 mL

## 2020-12-16 NOTE — Progress Notes (Signed)
Pt in chair at this time RT unable to do CPT.

## 2020-12-16 NOTE — Progress Notes (Signed)
Physical Therapy Treatment Patient Details Name: Brandon Daniel MRN: [id number] DOB: 1951/02/01 Today's Date: 12/16/2020    History of Present Illness [name] is a 70 y.o. male admitted 6/9 that  presented with SOB, was found to be Influenza A + and later on day of admission developed mild R facial droop, R arm drift and hand grip weakness along with R leg weakness. Left MCA and ACA CVA. Pt received tPa. VDRF 6/11-6/15. PMH: CAD, HTN, lumbar stenosis, HLD    PT Comments    Pt admitted with above diagnosis. Pt sat EOB with varying min guard for seconds to max assist as pt still pushes with left UE and has difficulty with sitting balance.  Scooted pt to drop arm recliner with max to total assist of 2 with use of pad.  Pt tolerated wwell overall and tried to assist.  Will continue to follow acutely. Did decr frequency to 3x week due to slow progress given deficits and pt to receive post acute rehab.  Pt currently with functional limitations due to balance and endurance deficits. Pt will benefit from skilled PT to increase their independence and safety with mobility to allow discharge to the venue listed below.      Follow Up Recommendations  CIR     Equipment Recommendations  Other (comment) (TBA)    Recommendations for Other Services Rehab consult     Precautions / Restrictions Precautions Precautions: Fall Restrictions Weight Bearing Restrictions: No    Mobility  Bed Mobility Overal bed mobility: Needs Assistance Bed Mobility: Rolling;Sidelying to Sit;Sit to Sidelying Rolling: Max assist Sidelying to sit: Max assist       General bed mobility comments: Pt able to assist minimally with rolling Lt and Rt, he required assist to move LEs off the EOB and to lift trunk. Incr time to get pt scooted to EOB.    Transfers Overall transfer level: Needs assistance Equipment used: 2 person hand held assist Transfers: Sit to/from Stand;Lateral/Scoot Transfers;Squat Pivot  Transfers Sit to Stand: Total assist;+2 physical assistance   Squat pivot transfers: Max assist;+2 physical assistance;From elevated surface;Total assist    Lateral/Scoot Transfers: Max assist;+2 physical assistance;From elevated surface General transfer comment: Laterally scooted pt to chair with use of pad to assist.  Pt weight bearing on bil LEs but not using his left hemibody as expected to assist with transfer.  Ambulation/Gait                 Stairs             Wheelchair Mobility    Modified Rankin (Stroke Patients Only) Modified Rankin (Stroke Patients Only) Pre-Morbid Rankin Score: No symptoms Modified Rankin: Severe disability     Balance Overall balance assessment: Needs assistance Sitting-balance support: Single extremity supported;No upper extremity supported;Feet supported Sitting balance-Leahy Scale: Poor Sitting balance - Comments: Pt sat a total of 10 minutes at EOB with varying assist from max to min guard at times (min guard for brief periods with pt unable to sustain on his own for more than seconds).  Pt losing balance all directions. holding his head to left. Postural control: Posterior lean;Right lateral lean;Left lateral lean                                  Cognition Arousal/Alertness: Awake/alert Behavior During Therapy: Flat affect Overall Cognitive Status: Impaired/Different from baseline Area of Impairment: Attention;Following commands;Problem solving  Current Attention Level: Focused   Following Commands: Follows one step commands inconsistently;Follows one step commands with increased time     Problem Solving: Slow processing;Decreased initiation;Difficulty sequencing;Requires verbal cues;Requires tactile cues General Comments: Follows 1-step verbal commands with intermittent consistency.      Exercises General Exercises - Lower Extremity Ankle Circles/Pumps: AROM;Left;10 reps;Supine  (P/ROM right) Heel Slides: AROM;Left;Supine;5 reps (P/ROM right) Hip Flexion/Marching: AROM;Left;10 reps;Supine Toe Raises: AROM;Left;10 reps;Supine    General Comments General comments (skin integrity, edema, etc.): Pt on 5L O2 with VSS.      Pertinent Vitals/Pain Pain Assessment: No/denies pain Faces Pain Scale: No hurt    Home Living                      Prior Function            PT Goals (current goals can now be found in the care plan section) Acute Rehab PT Goals Patient Stated Goal: to get better Progress towards PT goals: Progressing toward goals    Frequency    Min 3X/week      PT Plan Current plan remains appropriate;Frequency needs to be updated    Co-evaluation              AM-PAC PT "6 Clicks" Mobility   Outcome Measure  Help needed turning from your back to your side while in a flat bed without using bedrails?: Total Help needed moving from lying on your back to sitting on the side of a flat bed without using bedrails?: Total Help needed moving to and from a bed to a chair (including a wheelchair)?: Total Help needed standing up from a chair using your arms (e.g., wheelchair or bedside chair)?: Total Help needed to walk in hospital room?: Total Help needed climbing 3-5 steps with a railing? : Total 6 Click Score: 6    End of Session Equipment Utilized During Treatment: Oxygen;Gait belt Activity Tolerance: Patient limited by fatigue Patient left: with call bell/phone within reach;in chair;with chair alarm set Nurse Communication: Mobility status;Need for lift equipment PT Visit Diagnosis: Muscle weakness (generalized) (M62.81);Hemiplegia and hemiparesis Hemiplegia - Right/Left: Right Hemiplegia - dominant/non-dominant: Dominant Hemiplegia - caused by: Cerebral infarction     Time: 6754-4920 PT Time Calculation (min) (ACUTE ONLY): 23 min  Charges:  $Therapeutic Activity: 23-37 mins                     Brandon Daniel M,PT Acute Rehab  Services [phone] (pager)    Brandon Daniel 12/16/2020, 12:01 PM

## 2020-12-16 NOTE — Progress Notes (Signed)
eLink Physician-Brief Progress Note Patient Name: Brandon Daniel DOB: 1951/02/05 MRN: [id number]   Date of Service  12/16/2020  HPI/Events of Note  Urinary retention - Foley catheter removed today. Unable to void. Bladder scan with 500 mL residual. Apparently, several previous attempts to D/C Foley catheter have failed as well.   eICU Interventions  Replace Foley catheter.      Intervention Category Major Interventions: Other:  Lenell Antu 12/16/2020, 3:38 AM

## 2020-12-16 NOTE — Progress Notes (Signed)
PROGRESS NOTE    Brandon Daniel  NOB:[id number] DOB: 1950-08-14 DOA: 12/03/2020 PCP: Pcp, No    Brief Narrative:  70 year old male with a history of coronary artery disease, hypertension, hyperlipidemia, admitted to the hospital with shortness of breath.  He was found to be influenza positive, as well as having an elevated troponin.  He was admitted for further observation.  The day after admission, he was noted to have acute change in neuro status with acute right-sided weakness.  He was found to have left MCA/ACA territory infarct.  Seen by neurology and received tPA.  On 6/10, he had worsening respiratory status requiring intubation.  He was noted to have bilateral lower lobe pneumonia.  Subsequently extubated on 6/17.   Assessment & Plan:   Principal Problem:   Influenza A Active Problems:   NSTEMI (non-ST elevated myocardial infarction) (HCC)   CAD (coronary artery disease)   Asthma   Essential hypertension   Hyperlipidemia   Dyspnea   Pleural effusion on left   Stroke (cerebrum) (HCC)   Left MCA/ACA territory stroke -Received tPA on 6/9 secondary to symptomatic high-grade proximal left carotid stenosis -Neurology following -Currently on dual antiplatelet therapy with aspirin Plavix -He is still somewhat somnolent today and has aphasia since the stroke -Suspect that his somnolence is increased by issues with hypotension today  Left internal carotid artery stenosis -Seen by neuro interventional radiology who recommended outpatient follow-up for ICA stenting if patient makes for meaningful neurologic improvement  Non-STEMI -Patient noted to have new ST depressions in V6 and worsening T wave inversions in inferior leads -Seen by cardiology and underwent left heart cath on 6/14 that showed multivessel disease -Recommendations were for medical management for now -He will need to follow-up with cardiology to be considered for further therapy, provided he makes further neurologic  recovery  Acute respiratory failure with hypoxia -Secondary to influenza pneumonia/left pleural effusion -He has been treated with antiviral/antibiotics -Continue chest physical therapy -Currently on 5 to 7 L of oxygen -Wean off oxygen as tolerated  Type 2 diabetes -A1c of 6.2 -Continue sliding scale insulin and Lantus  Dysphagia -Related to stroke -Currently is n.p.o. and has core track for tube feeding  Urinary retention -Has failed voiding trial twice, but per staff was not on any medications -Has been started on Cardura -Can reattempt voiding trial in the next 2 to 3 days -If he fails voiding trial, he will need to keep Foley until he follows up with urology  Hypertension -Currently on metoprolol and losartan -Blood pressures have been running low -We will discontinue losartan and monitor     DVT prophylaxis: SCD's Start: 12/03/20 2310  Code Status: full code Family Communication: no family present today Disposition Plan: Status is: Inpatient  Remains inpatient appropriate because:Ongoing diagnostic testing needed not appropriate for outpatient work up and Inpatient level of care appropriate due to severity of illness  Dispo: The patient is from: Home              Anticipated d/c is to:  [facility]              Patient currently is not medically stable to d/c.   Difficult to place patient No         Consultants:  Neurology PCCM Cardiology (signed off)  Procedures:  ETT 6/10>6/17 LHC 6/14  Antimicrobials:  Tamiflu completed 6/13 Azithromycin 6/11 > 6/14 Ceftriaxone 6/11 > 6/15    Subjective: Patient is somnolent, briefly wakes up to voice to nod/shake his  head in response to questions, then falls back asleep. Staff has noted that he becomes more somnolent when his blood pressure runs low  Objective: Vitals:   12/16/20 1300 12/16/20 1400 12/16/20 1533 12/16/20 1609  BP: 92/63 (!) 90/57    Pulse: 98 90    Resp: (!) 23 (!) 28    Temp:    98.4 F  (36.9 C)  TempSrc:    Axillary  SpO2: 91% 91% 92%   Weight:      Height:        Intake/Output Summary (Last 24 hours) at 12/16/2020 1619 Last data filed at 12/16/2020 1300 Gross per 24 hour  Intake 2060 ml  Output 1095 ml  Net 965 ml   Filed Weights   12/12/20 0500 12/15/20 0425 12/16/20 0500  Weight: 100.5 kg 94.2 kg 93.9 kg    Examination:  General exam: Appears calm and comfortable  Respiratory system: bilateral rhonchi. Respiratory effort normal. Cardiovascular system: S1 & S2 heard, RRR. No JVD, murmurs, rubs, gallops or clicks. No pedal edema. Gastrointestinal system: Abdomen is nondistended, soft and nontender. No organomegaly or masses felt. Normal bowel sounds heard. Central nervous system: unable to evaluate due to somnolence Extremities: no edema bilaterally Skin: No rashes, lesions or ulcers Psychiatry: somnolent    Data Reviewed: I have personally reviewed following labs and imaging studies  CBC: Recent Labs  Lab 12/11/20 0137 12/12/20 0203 12/13/20 0342 12/14/20 0229 12/15/20 0132  WBC 6.9 7.4 6.0 6.7 6.1  HGB 9.4* 9.8* 10.7* 10.5* 11.0*  HCT 30.5* 31.9* 35.3* 35.4* 36.6*  MCV 93.8 93.5 93.4 93.2 92.9  PLT 234 287 358 391 415*   Basic Metabolic Panel: Recent Labs  Lab 12/10/20 0636 12/11/20 0137 12/12/20 1017 12/13/20 0342 12/14/20 0229 12/15/20 0132 12/16/20 0208  NA 142   < > 142 142 144 142 145  K 3.8   < > 3.9 4.1 4.0 4.2 4.4  CL 103   < > 104 100 103 103 108  CO2 30   < > 31 32 33* 32 29  GLUCOSE 173*   < > 230* 217* 230* 249* 189*  BUN 27*   < > 29* 35* 57* 59* 72*  CREATININE 0.74   < > 0.69 0.76 1.08 0.84 1.23  CALCIUM 8.3*   < > 8.5* 8.7* 8.4* 8.7* 8.7*  MG 2.2  --   --   --   --   --   --    < > = values in this interval not displayed.   GFR: Estimated Creatinine Clearance: 65.9 mL/min (by C-G formula based on SCr of 1.23 mg/dL). Liver Function Tests: No results for input(s): AST, ALT, ALKPHOS, BILITOT, PROT, ALBUMIN in  the last 168 hours. No results for input(s): LIPASE, AMYLASE in the last 168 hours. No results for input(s): AMMONIA in the last 168 hours. Coagulation Profile: No results for input(s): INR, PROTIME in the last 168 hours. Cardiac Enzymes: No results for input(s): CKTOTAL, CKMB, CKMBINDEX, TROPONINI in the last 168 hours. BNP (last 3 results) No results for input(s): PROBNP in the last 8760 hours. HbA1C: No results for input(s): HGBA1C in the last 72 hours. CBG: Recent Labs  Lab 12/15/20 2327 12/16/20 0320 12/16/20 0734 12/16/20 1123 12/16/20 1611  GLUCAP 201* 172* 197* 153* 214*   Lipid Profile: No results for input(s): CHOL, HDL, LDLCALC, TRIG, CHOLHDL, LDLDIRECT in the last 72 hours. Thyroid Function Tests: No results for input(s): TSH, T4TOTAL, FREET4, T3FREE, THYROIDAB in the last  72 hours. Anemia Panel: No results for input(s): VITAMINB12, FOLATE, FERRITIN, TIBC, IRON, RETICCTPCT in the last 72 hours. Sepsis Labs: No results for input(s): PROCALCITON, LATICACIDVEN in the last 168 hours.  No results found for this or any previous visit (from the past 240 hour(s)).       Radiology Studies: DG CHEST PORT 1 VIEW  Result Date: 12/16/2020 CLINICAL DATA:  Influenza a with pneumonia. EXAM: PORTABLE CHEST 1 VIEW COMPARISON:  December 14, 2019. FINDINGS: Similar patchy airspace opacities in the left greater than right lung bases. Suspected small left pleural effusion, similar. No visible pneumothorax. Similar cardiomediastinal silhouette. Postsurgical changes of CABG. Enteric tube courses below the diaphragm and outside the field of view. IMPRESSION: 1. Similar patchy airspace opacities in the left greater than right lung bases, suspicious for pneumonia. 2. Suspected small left pleural effusion, similar to prior. Electronically Signed   By: Feliberto Harts MD   On: 12/16/2020 12:29        Scheduled Meds:  arformoterol  15 mcg Nebulization BID   aspirin  81 mg Per Tube Daily    atorvastatin  80 mg Per Tube Daily   budesonide (PULMICORT) nebulizer solution  0.5 mg Nebulization BID   chlorhexidine  15 mL Mouth Rinse BID   Chlorhexidine Gluconate Cloth  6 each Topical Daily   clopidogrel  75 mg Per Tube Daily   doxazosin  2 mg Per Tube Daily   enoxaparin (LOVENOX) injection  50 mg Subcutaneous Q24H   ezetimibe  10 mg Per Tube Daily   feeding supplement (PROSource TF)  45 mL Per Tube TID   free water  100 mL Per Tube Q4H   gabapentin  600 mg Per Tube BID   insulin aspart  0-15 Units Subcutaneous Q4H   insulin aspart  8 Units Subcutaneous Q4H   insulin glargine  15 Units Subcutaneous BID   ipratropium-albuterol  3 mL Nebulization QID   mouth rinse  15 mL Mouth Rinse q12n4p   metoprolol tartrate  25 mg Per Tube BID   montelukast  10 mg Per Tube QHS   pantoprazole sodium  40 mg Per Tube QHS   polyethylene glycol  17 g Per Tube Daily   sodium chloride flush  3 mL Intravenous Q12H   sodium chloride HYPERTONIC  4 mL Nebulization BID   Continuous Infusions:  sodium chloride     feeding supplement (VITAL 1.5 CAL) 1,000 mL (12/16/20 0943)     LOS: 13 days    Time spent:    Erick Blinks, MD [facility]   If 7PM-7AM, please contact night-coverage [email or web address]  12/16/2020, 4:19 PM

## 2020-12-16 NOTE — Progress Notes (Signed)
STROKE TEAM PROGRESS NOTE   INTERVAL HISTORY His sister is   at the bedside.  Patient more alert and interactive today.  Remains aphasic but can follow simple commands. No acute events overnight.  Vital signs are stable. Neurological examination remains unchanged and stable; he is aphasic and follows limited commands to the RUE vital signs are stable.  Neurological exam unchanged  PERTINENT IMAGING   CT Head WO IV Contrast 10Jun2022 No acute intracranial abnormality. ASPECTS is 10.   MRI Brain WO IV Contrast 10Jun2022 Diffuse gray matter infarction in the left ACA and MCA territories.   Echocardiogram Complete 10Jun2022 Left ventricular ejection fraction, by estimation, is 55 to 60%. The left ventricle has normal function. Left ventricular diastolic parameters are indeterminate. Elevated left ventricular end-diastolic pressure.  Right ventricular systolic function is normal. The right ventricular size is normal. There is normal pulmonary artery systolic pressure. The estimated right ventricular systolic pressure is 11.0 mmHg.  The mitral valve is normal in structure. No evidence of mitral valve regurgitation. No evidence of mitral stenosis.  The aortic valve was not well visualized. Aortic valve regurgitation is not visualized. No aortic stenosis is present.  The inferior vena cava is normal in size with greater than 50% respiratory variability, suggesting right atrial pressure of 3 mmHg.   VAS US Carotid 10Jun2022 Right ICA 60-79% stenosis Left ICA 80-99% stenosis   CTA Head and Neck W WO IV Contrast 09Jun2022 Negative CTA for emergent large vessel occlusion. Extensive bulky calcified plaque about the left carotid bulb/proximal left ICA with associated severe near occlusive stenosis. Left ICA, MCA, and ACA markedly attenuated but remain patent distally. Short-segment 70% atheromatous stenosis at the proximal cervical right ICA. Severe left and moderate right vertebral artery origin  stenoses.  Vitals:   12/16/20 0738 12/16/20 0800 12/16/20 1038 12/16/20 1125  BP:  (!) 142/74 130/73   Pulse:  (!) 105 (!) 103   Resp:  (!) 24    Temp: 98.5 F (36.9 C)   98.3 F (36.8 C)  TempSrc: Axillary   Axillary  SpO2:  98%    Weight:      Height:        CBC:  Recent Labs  Lab 12/14/20 0229 12/15/20 0132  WBC 6.7 6.1  HGB 10.5* 11.0*  HCT 35.4* 36.6*  MCV 93.2 92.9  PLT 391 415*    Basic Metabolic Panel:  Recent Labs  Lab 12/10/20 0636 12/11/20 0137 12/15/20 0132 12/16/20 0208  NA 142   < > 142 145  K 3.8   < > 4.2 4.4  CL 103   < > 103 108  CO2 30   < > 32 29  GLUCOSE 173*   < > 249* 189*  BUN 27*   < > 59* 72*  CREATININE 0.74   < > 0.84 1.23  CALCIUM 8.3*   < > 8.7* 8.7*  MG 2.2  --   --   --    < > = values in this interval not displayed.    Lipid Panel: No results for input(s): CHOL, TRIG, HDL, CHOLHDL, VLDL, LDLCALC in the last 168 hours. HgbA1c:  Lab Results  Component Value Date   HGBA1C 6.2 (H) 12/04/2020   Urine Drug Screen: No results found for: LABOPIA, COCAINSCRNUR, LABBENZ, AMPHETMU, THCU, LABBARB  Alcohol Level No results found for: [facility]  EXAMINATION  General exam   HEENT-  Normocephalic, no lesions, without obvious abnormality.  Normal external eye and conjunctiva.  Dry mucous  membranes Cardiovascular- RRR  Lungs-Breathing room air, occasionally labored, occasional wheezing Musculoskeletal-no obvious deformity or swelling Skin-warm and dry, intact  Neurologic Exam: General: Pleasant middle-age male not in distress, laying in bed Mental Status: Is drowsy but can be aroused respond to stimulation.  Globally aphasic. Able to follow few simple commands speech mostly nonverbal with tries to speak. Cranial Nerves: II: Pupils equal, round III,IV, VI: ptosis not present, left gaze preference, does not cross midline V,VII: subtle right facial droop VIII: hearing normal bilaterally Sensory: Grimaces to pain on the right Motor:  Moves left upper and lower extremities spontaneously against gravity; 0/5 on the right. Tone and bulk normal; no atrophy noted Gait: Unable to assess   ASSESSMENT/PLAN Mr. Chamar Broughton is a 70 y.o. male with a past medical history significant for, HLD, HTN and lumbar stenosis who presented to [facility] with SOB and diagnoses with influenza A. On the day of admission he developed mild R facial droop, R arm drift and hand grip weakness along with R leg weakness. Code stroke was initiated and he received TPA.   #Left MCA/ACA territory stroke IV tPA given 09 Jun secondary to symptomatic high-grade proximal left carotid stenosis - MRI Brain: Left ACA and MCA territory stroke - CTA Head and Neck was pertinent anterior circulation stenosis w/ LICA showing severe stenosis/near occlusion. RICA was also notably stenosed. CUS revealed RICA w/60-79 % stenosis, LICA with 80-99 % stenosis - Echo: EF 55-60%, LA normal in size - Stroke labs: LDL 55, Hemoglobin A1C 6.2 - Stroke etiology is likely large vessel atherosclerosis - Continue DAPT for secondary stroke prevention with Aspirin 81 mg + Plavix 75 mg QD   # LICA stenosis - Neuro IR consulted for LICA stenosis on 6/13 for intervention - Currently neuro IR recommend outpt follow up for left ICA stenting consideration     - Agree. Would recommend watch and wait re: stent placement. More meaningful neurological improvement may be appropriate before procedure.   # NSTEMI Mr. Thielen has a known history of cardiomyopathy.Cardiology consulted for New ST depression in V6 and worsening T wave inversion in inferior leads; likely demand ischemia in the setting of known coronary artery disease (18 stents) and worsening hypoxia and acidosis. He underwent a cardiac cath on 12/08/20 that showed multivessel disease - Recommending medical treatment for now and state that distal lesions in RCA can be stented if patient has ischemia and has improved physical condition from a  neurological standpoint. - Continuing to follow cardiology guidance   # DVT Prophylaxis - Lovenox 50mg  QD - SCDs  # Essential Hypertension (History) - Home medication: HCTZ 25 QD, Atenolol 100 mg QD, Lisinopril 20 mg QD at home - SBP goal 90-140/50-85, currently pressures are trending 120s-140s/60s-70s - Continue Metoprolol 25mg  q12h, losartan 25mg  daily   # Hyperlipidemia - LDL goal is < 70. LDL is 55 and is at goal. - Continue Atorvastatin 80 mg and Zetia 10mg  daily   #Acute Respiratory Failure/ Influenza Pneumonia/Left Pleural Effusion Prior to admission Mr. Cambre reported having SOB and a cough. He was hypoxic in the ED and was found to be Influenza A positive - Intubated on 6/11 for increased WOB and hypoxia -> extubated 6/17 -> doing well on room air - CXR bilateral infiltrates and pulmonary edema - Albuterol nebs prn, Brovana BID, pulmicort neb BID, duoneb 4x daily, robitussin PRN - CCM on board   # DMII DMII diagnosis on admission - Hemoglobin A1C 6.2, goal <7 - CBGs in the  200s  - Continue SSI   #Dysphagia In the setting of L MCA stroke. SLP assessed him on 6/10, noted dysphagia and recommended NPO.  - Extubated 6/17 -> needs cortrak with TF   #Urinary retention - Foley removed 16Jun - Voiding trial post extubation, unclear if completed  # Influenza A Pneumonia Screened for influenza given recent travel to [place] and ongoing SOB prior to admission. Noted to be febrile and hypoxic this admission. - Tamiflu 75 BID for 5 days- 12/03/20 to 12/07/20 - Treated with Azithromycin 6/11 to 6/14 - Ceftriaxone 2 g Q24 hr started 6/11-6/15   #Fever Afebrile the last 48h, 98-100 deg F - Trending CBC, so far no leukocytosis. - Increased sputum- culture w/moderate WBC few gram positive and negative rods - CXR and UA ordered 6/14 as a part of fever work up. 6/14 CXR with slight increase in the degree of left basilar consolidation with associated effusion. 6/14 UA negative  for UTI.  #Dispo - Considering Mr. Speir improvements (extubation, remaining so), improved neurological function, he can move to the neuro floor for continued care. - When medically appropriate. PT/OT suggest [facility]  Hospital day # 13  Patient remains aphasic with  dense right hemiplegia on exam.  He does have symptomatic severe left carotid stenosis but revascularization will have to wait till patient shows at least modest improvement in rehab.  He also has significant underlying coronary artery disease which also may need cardiac cath in the future.  Recommend transfer outside the ICU to neurology stepdown bed.  Mobilize out of bed.  Avoid hypotension.  Continue ongoing therapies.  Await inpatient rehab when bed available long discussion with patient's sisters at the bedside  .  Discussed with patient and sister and answered questions Greater than 50% time during this 25-minute visit was spent on counseling and coordination of care and discussion with care team.   Delia Heady, MD Medical Director [facility] Pager: [phone] 12/16/2020 11:40 AM

## 2020-12-16 NOTE — Progress Notes (Signed)
PT in chair at this time rt unable to do CPT.

## 2020-12-17 ENCOUNTER — Inpatient Hospital Stay (HOSPITAL_COMMUNITY): Payer: Medicare (Managed Care)

## 2020-12-17 DIAGNOSIS — J9601 Acute respiratory failure with hypoxia: Secondary | ICD-10-CM

## 2020-12-17 LAB — POCT I-STAT 7, (LYTES, BLD GAS, ICA,H+H)
Acid-Base Excess: 10 mmol/L — ABNORMAL HIGH (ref 0.0–2.0)
Bicarbonate: 34 mmol/L — ABNORMAL HIGH (ref 20.0–28.0)
Calcium, Ion: 1.23 mmol/L (ref 1.15–1.40)
HCT: 35 % — ABNORMAL LOW (ref 39.0–52.0)
Hemoglobin: 11.9 g/dL — ABNORMAL LOW (ref 13.0–17.0)
O2 Saturation: 92 %
Patient temperature: 99.7
Potassium: 4.6 mmol/L (ref 3.5–5.1)
Sodium: 147 mmol/L — ABNORMAL HIGH (ref 135–145)
TCO2: 35 mmol/L — ABNORMAL HIGH (ref 22–32)
pCO2 arterial: 45.2 mmHg (ref 32.0–48.0)
pH, Arterial: 7.486 — ABNORMAL HIGH (ref 7.350–7.450)
pO2, Arterial: 61 mmHg — ABNORMAL LOW (ref 83.0–108.0)

## 2020-12-17 LAB — RENAL FUNCTION PANEL
Albumin: 2.2 g/dL — ABNORMAL LOW (ref 3.5–5.0)
Anion gap: 7 (ref 5–15)
BUN: 79 mg/dL — ABNORMAL HIGH (ref 8–23)
CO2: 30 mmol/L (ref 22–32)
Calcium: 8.8 mg/dL — ABNORMAL LOW (ref 8.9–10.3)
Chloride: 107 mmol/L (ref 98–111)
Creatinine, Ser: 1.26 mg/dL — ABNORMAL HIGH (ref 0.61–1.24)
GFR, Estimated: >60 mL/min (ref >60)
Glucose, Bld: 193 mg/dL — ABNORMAL HIGH (ref 70–99)
Phosphorus: 5.9 mg/dL — ABNORMAL HIGH (ref 2.5–4.6)
Potassium: 4.6 mmol/L (ref 3.5–5.1)
Sodium: 144 mmol/L (ref 135–145)

## 2020-12-17 LAB — CBC
HCT: 37.3 % — ABNORMAL LOW (ref 39.0–52.0)
Hemoglobin: 11 g/dL — ABNORMAL LOW (ref 13.0–17.0)
MCH: 27.6 pg (ref 26.0–34.0)
MCHC: 29.5 g/dL — ABNORMAL LOW (ref 30.0–36.0)
MCV: 93.5 fL (ref 80.0–100.0)
Platelets: 489 10*3/uL — ABNORMAL HIGH (ref 150–400)
RBC: 3.99 MIL/uL — ABNORMAL LOW (ref 4.22–5.81)
RDW: 15.3 % (ref 11.5–15.5)
WBC: 7.8 10*3/uL (ref 4.0–10.5)
nRBC: 0 % (ref 0.0–0.2)

## 2020-12-17 LAB — GLUCOSE, CAPILLARY
Glucose-Capillary: 167 mg/dL — ABNORMAL HIGH (ref 70–99)
Glucose-Capillary: 227 mg/dL — ABNORMAL HIGH (ref 70–99)
Glucose-Capillary: 191 mg/dL — ABNORMAL HIGH (ref 70–99)
Glucose-Capillary: 189 mg/dL — ABNORMAL HIGH (ref 70–99)
Glucose-Capillary: 107 mg/dL — ABNORMAL HIGH (ref 70–99)

## 2020-12-17 MED ORDER — LACTATED RINGERS IV SOLN: INTRAVENOUS | Status: DC

## 2020-12-17 NOTE — Progress Notes (Signed)
  Speech Language Pathology Treatment: Dysphagia;Cognitive-Linquistic  Patient Details Name: Brandon Daniel MRN: [id number] DOB: February 21, 1951 Today's Date: 12/17/2020 Time: 4098-1191 SLP Time Calculation (min) (ACUTE ONLY): 25 min  Assessment / Plan / Recommendation Clinical Impression  Continued therapeutic PO trials with SLP only. Pt with poor management of saliva with dried secretions throughout oral cavity, improving greatly s/p oral care by SLP. Per RN pt was very alert with family this morning but has been somewhat lethargic this afternoon. Pt able to open eyes for brief intervals. He was also able to follow simple one step commands but inconsistently 2/5 trials (during oral care). Significant aphasia and apraxia persist. Trialed ice chips and 1/2 tsp nectar thick liquids. Pt with oral stasis some lingual pumping but remained very disorganized and left sided anterior spillage without awareness. No palpable swallows felt this date and delayed coughing noted. No further PO trials were given due to severity clinically observed this date. No verbal output was elicited despite cueing. Pt may need long term means of alternative nutrition per family goals of care but SLP will continue to follow closely and maximize swallow recovery and cognitive linguistic recovery.       HPI HPI: Pt is a 70 year old male who was admitted for SOB from influenza A on 6/9 and developed R face and R sided weakness. Pt received tPA after normal head CT and subsequently exhibited worsening responsiveness; repeat CT on that date was still negative for acute changes. CTA: Extensive bulky calcified plaque about the left carotid bulb/proximal left ICA with associated severe near occlusive stenosis. Left ICA, MCA, and ACA markedly attenuated but patent distally. PMH: CAD, HTN, HLD.  Ongoing dysphagia since CVA s/p cortrak      SLP Plan  Continue with current plan of care       Recommendations  Diet recommendations:  NPO Medication Administration: Via alternative means                Oral Care Recommendations: Oral care QID;Staff/trained caregiver to provide oral care Follow up Recommendations: Skilled Nursing facility SLP Visit Diagnosis: Dysphagia, unspecified (R13.10);Dysphagia, oral phase (R13.11) Plan: Continue with current plan of care       GO                Ardyth Gal MA, CCC-SLP Acute Rehabilitation Services   12/17/2020, 3:05 PM

## 2020-12-17 NOTE — Progress Notes (Signed)
STROKE TEAM PROGRESS NOTE   INTERVAL HISTORY His sister is   at the bedside.  Patient remains alert and interactive today.  Remains aphasic but can follow simple commands. No acute events overnight.  Vital signs are stable. Neurological examination remains unchanged and stable; he is aphasic and follows limited commands to the RUE vital signs are stable.  Neurological exam unchanged  PERTINENT IMAGING   CT Head WO IV Contrast 10Jun2022 No acute intracranial abnormality. ASPECTS is 10.   MRI Brain WO IV Contrast 10Jun2022 Diffuse gray matter infarction in the left ACA and MCA territories.   Echocardiogram Complete 10Jun2022 Left ventricular ejection fraction, by estimation, is 55 to 60%. The left ventricle has normal function. Left ventricular diastolic parameters are indeterminate. Elevated left ventricular end-diastolic pressure.  Right ventricular systolic function is normal. The right ventricular size is normal. There is normal pulmonary artery systolic pressure. The estimated right ventricular systolic pressure is 11.0 mmHg.  The mitral valve is normal in structure. No evidence of mitral valve regurgitation. No evidence of mitral stenosis.  The aortic valve was not well visualized. Aortic valve regurgitation is not visualized. No aortic stenosis is present.  The inferior vena cava is normal in size with greater than 50% respiratory variability, suggesting right atrial pressure of 3 mmHg.   VAS US Carotid 10Jun2022 Right ICA 60-79% stenosis Left ICA 80-99% stenosis   CTA Head and Neck W WO IV Contrast 09Jun2022 Negative CTA for emergent large vessel occlusion. Extensive bulky calcified plaque about the left carotid bulb/proximal left ICA with associated severe near occlusive stenosis. Left ICA, MCA, and ACA markedly attenuated but remain patent distally. Short-segment 70% atheromatous stenosis at the proximal cervical right ICA. Severe left and moderate right vertebral artery origin  stenoses.  Vitals:   12/17/20 0742 12/17/20 1023 12/17/20 1100 12/17/20 1116  BP:  136/69    Pulse:      Resp:      Temp:   98.2 F (36.8 C)   TempSrc:   Axillary   SpO2: 94%   93%  Weight:      Height:        CBC:  Recent Labs  Lab 12/15/20 0132 12/17/20 0341  WBC 6.1 7.8  HGB 11.0* 11.0*  HCT 36.6* 37.3*  MCV 92.9 93.5  PLT 415* 489*    Basic Metabolic Panel:  Recent Labs  Lab 12/16/20 0208 12/17/20 0341  NA 145 144  K 4.4 4.6  CL 108 107  CO2 29 30  GLUCOSE 189* 193*  BUN 72* 79*  CREATININE 1.23 1.26*  CALCIUM 8.7* 8.8*  PHOS  --  5.9*    Lipid Panel: No results for input(s): CHOL, TRIG, HDL, CHOLHDL, VLDL, LDLCALC in the last 168 hours. HgbA1c:  Lab Results  Component Value Date   HGBA1C 6.2 (H) 12/04/2020   Urine Drug Screen: No results found for: LABOPIA, COCAINSCRNUR, LABBENZ, AMPHETMU, THCU, LABBARB  Alcohol Level No results found for: [facility]  EXAMINATION  General exam   HEENT-  Normocephalic, no lesions, without obvious abnormality.  Normal external eye and conjunctiva.  Dry mucous membranes Cardiovascular- RRR  Lungs-Breathing room air, occasionally labored, occasional wheezing Musculoskeletal-no obvious deformity or swelling Skin-warm and dry, intact  Neurologic Exam: General: Pleasant middle-age male not in distress, laying in bed Mental Status: Is drowsy but can be aroused respond to stimulation.  Globally aphasic. Able to follow few simple commands speech mostly nonverbal with tries to speak. Cranial Nerves: II: Pupils equal, round III,IV, VI:  ptosis not present, left gaze preference, does not cross midline V,VII: subtle right facial droop VIII: hearing normal bilaterally Sensory: Grimaces to pain on the right Motor: Moves left upper and lower extremities spontaneously against gravity; 0/5 on the right. Tone and bulk normal; no atrophy noted Gait: Unable to assess   ASSESSMENT/PLAN Brandon Daniel is a 70 y.o. male with a past  medical history significant for, HLD, HTN and lumbar stenosis who presented to [facility] with SOB and diagnoses with influenza A. On the day of admission he developed mild R facial droop, R arm drift and hand grip weakness along with R leg weakness. Code stroke was initiated and he received TPA.   #Left MCA/ACA territory stroke IV tPA given 09 Jun secondary to symptomatic high-grade proximal left carotid stenosis - MRI Brain: Left ACA and MCA territory stroke - CTA Head and Neck was pertinent anterior circulation stenosis w/ LICA showing severe stenosis/near occlusion. RICA was also notably stenosed. CUS revealed RICA w/60-79 % stenosis, LICA with 80-99 % stenosis - Echo: EF 55-60%, LA normal in size - Stroke labs: LDL 55, Hemoglobin A1C 6.2 - Stroke etiology is likely large vessel atherosclerosis - Continue DAPT for secondary stroke prevention with Aspirin 81 mg + Plavix 75 mg QD   # LICA stenosis - Neuro IR consulted for LICA stenosis on 6/13 for intervention - Currently neuro IR recommend outpt follow up for left ICA stenting consideration     - Agree. Would recommend watch and wait re: stent placement. More meaningful neurological improvement may be appropriate before procedure.   # NSTEMI Brandon Daniel has a known history of cardiomyopathy.Cardiology consulted for New ST depression in V6 and worsening T wave inversion in inferior leads; likely demand ischemia in the setting of known coronary artery disease (18 stents) and worsening hypoxia and acidosis. He underwent a cardiac cath on 12/08/20 that showed multivessel disease - Recommending medical treatment for now and state that distal lesions in RCA can be stented if patient has ischemia and has improved physical condition from a neurological standpoint. - Continuing to follow cardiology guidance   # DVT Prophylaxis - Lovenox 50mg  QD - SCDs  # Essential Hypertension (History) - Home medication: HCTZ 25 QD, Atenolol 100 mg QD, Lisinopril 20 mg  QD at home - SBP goal 90-140/50-85, currently pressures are trending 120s-140s/60s-70s - Continue Metoprolol 25mg  q12h, losartan 25mg  daily   # Hyperlipidemia - LDL goal is < 70. LDL is 55 and is at goal. - Continue Atorvastatin 80 mg and Zetia 10mg  daily   #Acute Respiratory Failure/ Influenza Pneumonia/Left Pleural Effusion Prior to admission Brandon Daniel reported having SOB and a cough. He was hypoxic in the ED and was found to be Influenza A positive - Intubated on 6/11 for increased WOB and hypoxia -> extubated 6/17 -> doing well on room air - CXR bilateral infiltrates and pulmonary edema - Albuterol nebs prn, Brovana BID, pulmicort neb BID, duoneb 4x daily, robitussin PRN - CCM on board   # DMII DMII diagnosis on admission - Hemoglobin A1C 6.2, goal <7 - CBGs in the 200s  - Continue SSI   #Dysphagia In the setting of L MCA stroke. SLP assessed him on 6/10, noted dysphagia and recommended NPO.  - Extubated 6/17 -> needs cortrak with TF   #Urinary retention - Foley removed 16Jun - Voiding trial post extubation, unclear if completed  # Influenza A Pneumonia Screened for influenza given recent travel to [place] and ongoing SOB prior to  admission. Noted to be febrile and hypoxic this admission. - Tamiflu 75 BID for 5 days- 12/03/20 to 12/07/20 - Treated with Azithromycin 6/11 to 6/14 - Ceftriaxone 2 g Q24 hr started 6/11-6/15   #Fever Afebrile the last 48h, 98-100 deg F - Trending CBC, so far no leukocytosis. - Increased sputum- culture w/moderate WBC few gram positive and negative rods - CXR and UA ordered 6/14 as a part of fever work up. 6/14 CXR with slight increase in the degree of left basilar consolidation with associated effusion. 6/14 UA negative for UTI.  #Dispo - Considering Brandon Daniel improvements (extubation, remaining so), improved neurological function, he can move to the neuro floor for continued care. - When medically appropriate. PT/OT suggest  [facility]  Hospital day # 14  Patient remains aphasic with  dense right hemiplegia on exam.  He does have symptomatic severe left carotid stenosis but revascularization will have to wait till patient shows at least modest improvement in rehab.  He also has significant underlying coronary artery disease which also may need cardiac cath in the future.  Recommend transfer outside the ICU to neurology stepdown bed.  Mobilize out of bed.    Continue ongoing therapies.  Await inpatient rehab when bed available long discussion with patient's sisters at the bedside  .  Discussed with patient and sister and answered questions Greater than 50% time during this 25-minute visit was spent on counseling and coordination of care and discussion with care team.   Delia Heady, MD Medical Director [facility] Pager: [phone] 12/17/2020 1:31 PM

## 2020-12-17 NOTE — Progress Notes (Signed)
Initial Nutrition Assessment  DOCUMENTATION CODES:   Obesity unspecified  INTERVENTION:   Continue tube feeding via Cortrak: - Increase Vital 1.5 to 65 ml/hr (1560 ml/day) - ProSource TF 45 ml TID - Continue free water flushes of 200 ml q 4 hours   Tube feeding regimen provides 2460 kcal, 138 grams of protein, and 1192 ml of H2O.   Total free water with flushes: 2392 ml  NUTRITION DIAGNOSIS:   Inadequate oral intake related to inability to eat as evidenced by NPO status.  Ongoing, being addressed via TF  GOAL:   Patient will meet greater than or equal to 90% of their needs  Met via TF  MONITOR:   Diet advancement, Labs, Weight trends, TF tolerance, I & O's  REASON FOR ASSESSMENT:   Ventilator, Consult Enteral/tube feeding initiation and management  ASSESSMENT:   70 year old male who presented to the ED on 6/09 with SOB. 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 Pt found to have NSTEMI and influenza A. Pt developed R facial droop, R arm drift and hand grip weakness along with R leg weakness and pt likely with CVA s/p tPA.  6/11 - intubated 6/14 - s/p LHC 6/17 - extubated, Cortrak placed (tip gastric)  Discussed pt with RN and during ICU rounds. Pt tolerating tube feeds without issue. Noted pt may d/c to CIR.  Met with pt and sister at bedside. Pt sleeping soundly at time of RD visit. RD assessed Cortrak site and no skin breakdown noted.  Admit weight: 99.2 kg Current weight: 94.2 kg  Pt with a weight loss of 5 kg since admission. This is a 5% weight loss in less than 1 month which is significant for timeframe. RD will increase tube feeding rate to prevent additional weight loss and continue to monitor weigh trends.  Medications reviewed and include: SSI q 4 hours, novolog 8 units q 4 hours, lantus 15 units BID, protonix, miralax  Labs reviewed: BUN 79, creatinine 1.26, phosphorus 5.9, hemoglobin 11.0 CBG's: 161-227 x 24 hours  UOP: 1304 ml x  24 hours  Diet Order:   Diet Order             Diet NPO time specified  Diet effective now                   EDUCATION NEEDS:   No education needs have been identified at this time  Skin:  Skin Assessment: Reviewed RN Assessment  Last BM:  12/13/20  Height:   Ht Readings from Last 1 Encounters:  12/05/20 5' 9.02" (1.753 m)    Weight:   Wt Readings from Last 1 Encounters:  12/17/20 94.2 kg    BMI:  Body mass index is 30.65 kg/m.  Estimated Nutritional Needs:   Kcal:  2300-2500  Protein:  120-140 grams  Fluid:  >/= 2.0 L    Gustavus Bryant, MS, RD, LDN Inpatient Clinical Dietitian Please see AMiON for contact information.

## 2020-12-17 NOTE — Care Management Important Message (Signed)
Important Message  Patient Details  Name: Brandon Daniel MRN: [id number] Date of Birth: [birth year]   Medicare Important Message Given:  Yes     Dorena Bodo 12/17/2020, 2:37 PM

## 2020-12-17 NOTE — Progress Notes (Signed)
During shift report pt HR 120s with RR 30s and increased WOB. Pt noted to have a fever, placed ice packs and gave prn tylenol. Over last few hours, fever has abated and HR is now low ST, with RR mid 20s as has been the last several days. CCM came to bedside to evaluate need for intubation, stated no need at this time. Pt easily arousable to voice, ability to follow commands is consistent with previous assessments.

## 2020-12-17 NOTE — Progress Notes (Signed)
PCCM Interval Progress Note  PCCM Ground Team asked by day team to evaluate Brandon Daniel. History of CVA with concern for aspiration and persistent high O2 requirements.  On arrival to bedside, patient utilizing NRB mask, satting 100%. Patient's nurse, Brandon Daniel, met CCM at bedside and reported baseline mental status. Patient does remain febrile, ice packs in place and APAP given. Tachycardia improving. RN plans to attempt to wean him from NRB back to nasal cannula. Agreed no need for intubation/escalation at this time.  Will continue to monitor, PCCM available as needed.  Lestine Mount, PA-C [facility] 12/17/20 11:07 PM  Please see [email or web address] for pager details.  From 7A-7P if no response, please call [phone] After hours, please call ELink [phone]

## 2020-12-17 NOTE — Progress Notes (Signed)
PROGRESS NOTE    Brandon Daniel  STM:[id number] DOB: 08-27-1950 DOA: 12/03/2020 PCP: Pcp, No    Brief Narrative:  70 year old male with a history of coronary artery disease, hypertension, hyperlipidemia, admitted to the hospital with shortness of breath.  He was found to be influenza positive, as well as having an elevated troponin.  He was admitted for further observation.  The day after admission, he was noted to have acute change in neuro status with acute right-sided weakness.  He was found to have left MCA/ACA territory infarct.  Seen by neurology and received tPA.  On 6/10, he had worsening respiratory status requiring intubation.  He was noted to have bilateral lower lobe pneumonia.  Subsequently extubated on 6/17.   Assessment & Plan:   Principal Problem:   Influenza A Active Problems:   NSTEMI (non-ST elevated myocardial infarction) (HCC)   CAD (coronary artery disease)   Asthma   Essential hypertension   Hyperlipidemia   Dyspnea   Pleural effusion on left   Stroke (cerebrum) (HCC)   Acute respiratory failure with hypoxia (HCC)   Left MCA/ACA territory stroke -Received tPA on 6/9 secondary to symptomatic high-grade proximal left carotid stenosis -Neurology following -Currently on dual antiplatelet therapy with aspirin Plavix -He is increasingly somnolent today -checking ABG  Left internal carotid artery stenosis -Seen by neuro interventional radiology who recommended outpatient follow-up for ICA stenting if patient makes for meaningful neurologic improvement  Non-STEMI -Patient noted to have new ST depressions in V6 and worsening T wave inversions in inferior leads -Seen by cardiology and underwent left heart cath on 6/14 that showed multivessel disease -Recommendations were for medical management for now -He will need to follow-up with cardiology to be considered for further therapy, provided he makes further neurologic recovery  Acute respiratory failure with  hypoxia -Secondary to influenza pneumonia/left pleural effusion/increased respiratory secretions -He has been treated with antiviral/antibiotics -Continue chest physical therapy -oxygen requirements have increased today to 15L and he has increased secretions requiring suctioning and increased respiratory rate -appears to be mostly upper airway -holding tube feeding for now -will check blood gas -he is at high risk for re-intubation -dicussed with Dr. Vassie Loll  AKI -BUN/creatinine have been trending up -suspect that he may have increased fluid loss -does not appear to be volume overload -will try on gentle hydration  Type 2 diabetes -A1c of 6.2 -Continue sliding scale insulin and Lantus  Dysphagia -Related to stroke -Currently is n.p.o. and has core track for tube feeding -may need to consider PEG tube if does not improve  Urinary retention -Has failed voiding trial twice, but per staff was not on any medications -Has been started on Cardura -Can reattempt voiding trial in the next 2 to 3 days -If he fails voiding trial, he will need to keep Foley until he follows up with urology  Hypertension -stable on metoprolol     DVT prophylaxis: SCD's Start: 12/03/20 2310  Code Status: full code Family Communication: I updated patient's nephew Kandis Mannan over the phone. He is discussing further care including re-intubation and PEG tube with family Disposition Plan: Status is: Inpatient  Remains inpatient appropriate because:Ongoing diagnostic testing needed not appropriate for outpatient work up and Inpatient level of care appropriate due to severity of illness  Dispo: The patient is from: Home              Anticipated d/c is to:  CIR  vs. SNF  Patient currently is not medically stable to d/c.   Difficult to place patient No    Consultants:  Neurology PCCM Cardiology (signed off)  Procedures:  ETT 6/10>6/17 LHC 6/14  Antimicrobials:  Tamiflu completed  6/13 Azithromycin 6/11 > 6/14 Ceftriaxone 6/11 > 6/15    Subjective: Patient is more somnolent today, does not open eyes or answer questions. Staff reports that he has required frequent suctioning and is putting out thick yellow secretions.   Objective: Vitals:   12/17/20 1615 12/17/20 1637 12/17/20 1758 12/17/20 1800  BP:      Pulse: (!) 120 (!) 109 (!) 125 (!) 119  Resp: (!) 22 (!) 24  (!) 24  Temp:      TempSrc:      SpO2: 92% 95% (!) 89% (!) 89%  Weight:      Height:        Intake/Output Summary (Last 24 hours) at 12/17/2020 1824 Last data filed at 12/17/2020 1800 Gross per 24 hour  Intake 1990 ml  Output 400 ml  Net 1590 ml   Filed Weights   12/15/20 0425 12/16/20 0500 12/17/20 0500  Weight: 94.2 kg 93.9 kg 94.2 kg    Examination:  General exam: somnolent Respiratory system: upper airway rhonchi, short shallow breathing Cardiovascular system:RRR. No murmurs, rubs, gallops. Gastrointestinal system: Abdomen is nondistended, soft and nontender. No organomegaly or masses felt. Normal bowel sounds heard. Central nervous system: No focal neurological deficits. Extremities: No C/C/E, +pedal pulses Skin: No rashes, lesions or ulcers Psychiatry: somnolent      Data Reviewed: I have personally reviewed following labs and imaging studies  CBC: Recent Labs  Lab 12/12/20 0203 12/13/20 0342 12/14/20 0229 12/15/20 0132 12/17/20 0341  WBC 7.4 6.0 6.7 6.1 7.8  HGB 9.8* 10.7* 10.5* 11.0* 11.0*  HCT 31.9* 35.3* 35.4* 36.6* 37.3*  MCV 93.5 93.4 93.2 92.9 93.5  PLT 287 358 391 415* 489*   Basic Metabolic Panel: Recent Labs  Lab 12/13/20 0342 12/14/20 0229 12/15/20 0132 12/16/20 0208 12/17/20 0341  NA 142 144 142 145 144  K 4.1 4.0 4.2 4.4 4.6  CL 100 103 103 108 107  CO2 32 33* 32 29 30  GLUCOSE 217* 230* 249* 189* 193*  BUN 35* 57* 59* 72* 79*  CREATININE 0.76 1.08 0.84 1.23 1.26*  CALCIUM 8.7* 8.4* 8.7* 8.7* 8.8*  PHOS  --   --   --   --  5.9*    GFR: Estimated Creatinine Clearance: 64.5 mL/min (A) (by C-G formula based on SCr of 1.26 mg/dL (H)). Liver Function Tests: Recent Labs  Lab 12/17/20 0341  ALBUMIN 2.2*   No results for input(s): LIPASE, AMYLASE in the last 168 hours. No results for input(s): AMMONIA in the last 168 hours. Coagulation Profile: No results for input(s): INR, PROTIME in the last 168 hours. Cardiac Enzymes: No results for input(s): CKTOTAL, CKMB, CKMBINDEX, TROPONINI in the last 168 hours. BNP (last 3 results) No results for input(s): PROBNP in the last 8760 hours. HbA1C: No results for input(s): HGBA1C in the last 72 hours. CBG: Recent Labs  Lab 12/16/20 2341 12/17/20 0304 12/17/20 0717 12/17/20 1110 12/17/20 1525  GLUCAP 167* 167* 227* 191* 189*   Lipid Profile: No results for input(s): CHOL, HDL, LDLCALC, TRIG, CHOLHDL, LDLDIRECT in the last 72 hours. Thyroid Function Tests: No results for input(s): TSH, T4TOTAL, FREET4, T3FREE, THYROIDAB in the last 72 hours. Anemia Panel: No results for input(s): VITAMINB12, FOLATE, FERRITIN, TIBC, IRON, RETICCTPCT in the last 72  hours. Sepsis Labs: No results for input(s): PROCALCITON, LATICACIDVEN in the last 168 hours.  No results found for this or any previous visit (from the past 240 hour(s)).       Radiology Studies: DG CHEST PORT 1 VIEW  Result Date: 12/16/2020 CLINICAL DATA:  Influenza a with pneumonia. EXAM: PORTABLE CHEST 1 VIEW COMPARISON:  December 14, 2019. FINDINGS: Similar patchy airspace opacities in the left greater than right lung bases. Suspected small left pleural effusion, similar. No visible pneumothorax. Similar cardiomediastinal silhouette. Postsurgical changes of CABG. Enteric tube courses below the diaphragm and outside the field of view. IMPRESSION: 1. Similar patchy airspace opacities in the left greater than right lung bases, suspicious for pneumonia. 2. Suspected small left pleural effusion, similar to prior. Electronically  Signed   By: Feliberto Harts MD   On: 12/16/2020 12:29        Scheduled Meds:  arformoterol  15 mcg Nebulization BID   aspirin  81 mg Per Tube Daily   atorvastatin  80 mg Per Tube Daily   budesonide (PULMICORT) nebulizer solution  0.5 mg Nebulization BID   chlorhexidine  15 mL Mouth Rinse BID   Chlorhexidine Gluconate Cloth  6 each Topical Daily   clopidogrel  75 mg Per Tube Daily   doxazosin  2 mg Per Tube Daily   enoxaparin (LOVENOX) injection  50 mg Subcutaneous Q24H   ezetimibe  10 mg Per Tube Daily   feeding supplement (PROSource TF)  45 mL Per Tube TID   free water  200 mL Per Tube Q4H   gabapentin  600 mg Per Tube BID   insulin aspart  0-15 Units Subcutaneous Q4H   insulin aspart  8 Units Subcutaneous Q4H   insulin glargine  15 Units Subcutaneous BID   ipratropium-albuterol  3 mL Nebulization QID   mouth rinse  15 mL Mouth Rinse q12n4p   metoprolol tartrate  25 mg Per Tube BID   montelukast  10 mg Per Tube QHS   pantoprazole sodium  40 mg Per Tube QHS   polyethylene glycol  17 g Per Tube Daily   sodium chloride flush  3 mL Intravenous Q12H   Continuous Infusions:  sodium chloride     feeding supplement (VITAL 1.5 CAL) 1,000 mL (12/16/20 0943)     LOS: 14 days    Time spent:    Erick Blinks, MD [facility]   If 7PM-7AM, please contact night-coverage [email or web address]  12/17/2020, 6:24 PM

## 2020-12-17 NOTE — Progress Notes (Addendum)
Inpatient Rehabilitation Admissions Coordinator   I met at bedside with patient with a sister and brother in law. I will contact Nephew, Brandon Daniel, to give update on pt's physical progress and to further discuss rehab venue options.  Danne Baxter, RN, MSN Rehab Admissions Coordinator [phone] 12/17/2020 12:01 PM   I contacted pt's nephew, Brandon Daniel, by phone. I discussed that patient likely will need longer rehab recovery than CIR can offer of 2 to 3 weeks before returning home with pt's sister and brother in law providing his care. I also discussed the possible need for PEG if his swallowing had not progressed to not need alternate means of feed. Brandon Daniel will discuss with his parents and would like follow up by [facility] for possible SNF placement. I will continue to follow short term in case he progresses to level for CIR admit. I have alerted acute team and TOC of plans, Colletta Maryland RN CM and Lorriane Shire, SW.  Danne Baxter, RN, MSN Rehab Admissions Coordinator [phone] 12/17/2020 1:30 PM

## 2020-12-17 NOTE — Progress Notes (Signed)
NAME:  Brandon Daniel, MRN:  [id number], DOB:  [birth year], LOS: 14 ADMISSION DATE:  12/03/2020, CONSULTATION DATE:  12/04/20 REFERRING MD:  Derry Lory, CHIEF COMPLAINT:  weakness   History of Present Illness:  70 year old man with hx of CAD, HTN, HLD admitted for SOB from influenza A on 12/03/20.  Developed R face and R sided weakness this evening and received TPA after normal head CT.  After this he developed some worsening responsiveness so another CT ordered  He required mechanical ventilation for respiratory distress and bibasilar pneumonia.  Course complicated by non-STEMI, extubated 6/17  Pertinent  Medical History  Asthma/COPD overlad CAD HLD HTN  Significant Hospital Events: Including procedures, antibiotic start and stop dates in addition to other pertinent events   6/9 admitted 6/9 evening code stroke, tpa, PCCM eval 6/10 Intubated for resp distress 6/11 Lt IJ CVL placed, levo for hypotension, started heparin drip for NSTEMI and cardiology consulted 6/14 Left Heart Cath >> multiple chronic total or near total occlusions. 6/15  started lasix to help with vent liberation 6/16 diuresed 6/17 extubated, diuresed, 5L O2 needed   Interim History / Subjective:   Developed respiratory distress and PCCM asked to evaluate again. Low-grade fevers Transiently required nonrebreather and NT suctioning    Objective   Blood pressure 136/69, pulse (!) 109, temperature 99.5 F (37.5 C), temperature source Axillary, resp. rate (!) 24, height 5' 9.02" (1.753 m), weight 94.2 kg, SpO2 95 %.    FiO2 (%):  [94 %-100 %] 94 %   Intake/Output Summary (Last 24 hours) at 12/17/2020 1647 Last data filed at 12/17/2020 1600 Gross per 24 hour  Intake 2050 ml  Output 468 ml  Net 1582 ml    Filed Weights   12/15/20 0425 12/16/20 0500 12/17/20 0500  Weight: 94.2 kg 93.9 kg 94.2 kg    Examination: Gen:      Mild distress, on 10 L nasal cannula HEENT: ncat,sclera anicteric,  Neck:     No  masses; no thyromegaly Lungs:   Mild accessory muscle use, poor cough, rhonchi on left CV:         Regular rate and rhythm; no murmurs Abd:      + bowel sounds; soft, non-tender; no palpable masses, no distension Skin:      Warm and dry; no rash Neuro: Aphasic, right dense hemiplegia, lethargic, follow commands good strength on left   Labs/imaging that I havepersonally reviewed  (right click and "Reselect all SmartList Selections" daily)  Chest x-ray 6/22 shows unchanged patchy airspace disease more on left than on the right  Labs show no leukocytosis, normal electrolytes  Resolved Hospital Problem list   AKI  Assessment & Plan:  Acute hypoxic respiratory failure due to inability to clear secretions, acute pulmonary edema, Influenza A pna -possible COPD per nephew  -Tracheobronchial toilet, improved with NT suctioning -Continue hypertonic saline nebs, chest PT via bed, flutter valve However due to his poor cough, he remains at high risk for reintubation -Hold tube feeds due to increased oxygen requirements  Fever, no leukocytosis -Have to consider noninfectious causes -Repeat respiratory culture  Left ACA, MCA stroke s/p tPA --TTE ok, no Afib seen, ICA stenosis to be dealt with OP --Vertebral stensois to be dealt with OP --statin, ASA, plavix -- Failed swallow again 6/23, may need PEG -Good candidate for CIR if he improves   Non ST elevation MI, history of cardiomyopathy ischemic s/p CABG, acute pulmonary edema --LHC revealed restenosed grafts and multivessel disease -Medical  management per cardiology    New onset diabetes , uncontrolled HbA1C 6.2 continue Lantus 15 Q 12H ct TF coverage to 8u q 4h SSI     Cyril Mourning MD. [facility]. [facility] Pager : [phone]  If no response to pager , please call [phone] until 7 pm After 7:00 pm call Elink  [phone]   12/17/2020  Nunzio Banet V. Vassie Loll, MD

## 2020-12-17 NOTE — TOC Transition Note (Addendum)
Transition of Care [facility]) - CM/SW Discharge Note   Patient Details  Name: Brandon Daniel MRN: [id number] Date of Birth: May 26, 1951  [facility] CM/SW Contact:  Lockie Pares, RN Phone Number: 12/17/2020, 4:34 PM   Clinical Narrative:     CIR saw patient and have not accepted him, they feel it may be too intensive and he would need SNF placement. Nephew was made aware of this and the fact that he may need PEG placement.prior to SNF placement.  lHe is talking it over with family.   SLP  consult today continues to confirm need for tube feedings.     Barriers to Discharge: Continued Medical Work up   Patient Goals and CMS Choice        Discharge Placement             SNF for rehabilitation          Discharge Plan and Services In-house Referral: Clinical Social Work   Post Acute Care Choice: IP Rehab                               Social Determinants of Health (SDOH) Interventions     Readmission Risk Interventions No flowsheet data found.

## 2020-12-17 NOTE — Progress Notes (Signed)
Tracheal aspirate collected and sent to lab. 

## 2020-12-18 ENCOUNTER — Inpatient Hospital Stay (HOSPITAL_COMMUNITY): Payer: Medicare (Managed Care)

## 2020-12-18 DIAGNOSIS — I639 Cerebral infarction, unspecified: Secondary | ICD-10-CM

## 2020-12-18 LAB — URINALYSIS, ROUTINE W REFLEX MICROSCOPIC
Bilirubin Urine: NEGATIVE
Glucose, UA: NEGATIVE mg/dL
Ketones, ur: NEGATIVE mg/dL
Nitrite: POSITIVE — AB
Protein, ur: 100 mg/dL — AB
Specific Gravity, Urine: 1.02 (ref 1.005–1.030)
pH: 6 (ref 5.0–8.0)

## 2020-12-18 LAB — BASIC METABOLIC PANEL
Anion gap: 9 (ref 5–15)
BUN: 61 mg/dL — ABNORMAL HIGH (ref 8–23)
CO2: 33 mmol/L — ABNORMAL HIGH (ref 22–32)
Calcium: 8.8 mg/dL — ABNORMAL LOW (ref 8.9–10.3)
Chloride: 103 mmol/L (ref 98–111)
Creatinine, Ser: 1.13 mg/dL (ref 0.61–1.24)
GFR, Estimated: >60 mL/min (ref >60)
Glucose, Bld: 163 mg/dL — ABNORMAL HIGH (ref 70–99)
Potassium: 4.5 mmol/L (ref 3.5–5.1)
Sodium: 145 mmol/L (ref 135–145)

## 2020-12-18 LAB — MRSA NEXT GEN BY PCR, NASAL: MRSA by PCR Next Gen: NOT DETECTED

## 2020-12-18 LAB — URINALYSIS, MICROSCOPIC (REFLEX)

## 2020-12-18 LAB — CBC
HCT: 40.4 % (ref 39.0–52.0)
Hemoglobin: 11.9 g/dL — ABNORMAL LOW (ref 13.0–17.0)
MCH: 27.7 pg (ref 26.0–34.0)
MCHC: 29.5 g/dL — ABNORMAL LOW (ref 30.0–36.0)
MCV: 94.2 fL (ref 80.0–100.0)
Platelets: 446 10*3/uL — ABNORMAL HIGH (ref 150–400)
RBC: 4.29 MIL/uL (ref 4.22–5.81)
RDW: 15.4 % (ref 11.5–15.5)
WBC: 11.9 10*3/uL — ABNORMAL HIGH (ref 4.0–10.5)
nRBC: 0 % (ref 0.0–0.2)

## 2020-12-18 LAB — GLUCOSE, CAPILLARY
Glucose-Capillary: 140 mg/dL — ABNORMAL HIGH (ref 70–99)
Glucose-Capillary: 154 mg/dL — ABNORMAL HIGH (ref 70–99)
Glucose-Capillary: 173 mg/dL — ABNORMAL HIGH (ref 70–99)
Glucose-Capillary: 190 mg/dL — ABNORMAL HIGH (ref 70–99)
Glucose-Capillary: 194 mg/dL — ABNORMAL HIGH (ref 70–99)
Glucose-Capillary: 208 mg/dL — ABNORMAL HIGH (ref 70–99)
Glucose-Capillary: 223 mg/dL — ABNORMAL HIGH (ref 70–99)

## 2020-12-18 MED ORDER — SODIUM CHLORIDE 3 % IN NEBU
4.0000 mL | INHALATION_SOLUTION | Freq: Two times a day (BID) | RESPIRATORY_TRACT | Status: AC
  Administered 2020-12-18 – 2020-12-19 (×4): 4 mL via RESPIRATORY_TRACT
  Filled 2020-12-18 (×6): qty 4

## 2020-12-18 MED ORDER — SODIUM CHLORIDE 0.9 % IV SOLN
2.0000 g | Freq: Three times a day (TID) | INTRAVENOUS | Status: DC
  Administered 2020-12-18 – 2020-12-20 (×6): 2 g via INTRAVENOUS
  Filled 2020-12-18 (×6): qty 2

## 2020-12-18 MED ORDER — VITAL 1.5 CAL PO LIQD
1000.0000 mL | ORAL | Status: DC
  Administered 2020-12-18 – 2020-12-24 (×7): 1000 mL
  Filled 2020-12-18 (×8): qty 1000

## 2020-12-18 MED ORDER — HEPARIN (PORCINE) 25000 UT/250ML-% IV SOLN
1400.0000 [IU]/h | INTRAVENOUS | Status: DC
  Administered 2020-12-18 – 2020-12-19 (×2): 1400 [IU]/h via INTRAVENOUS
  Filled 2020-12-18 (×2): qty 250

## 2020-12-18 MED ORDER — VANCOMYCIN HCL 1750 MG/350ML IV SOLN
1750.0000 mg | INTRAVENOUS | Status: DC
  Administered 2020-12-18 – 2020-12-20 (×3): 1750 mg via INTRAVENOUS
  Filled 2020-12-18 (×3): qty 350

## 2020-12-18 NOTE — Progress Notes (Signed)
PROGRESS NOTE    Brandon Daniel  ZOX:[id number] DOB: 21-Jan-1951 DOA: 12/03/2020 PCP: Pcp, No    Brief Narrative:  70 year old male with a history of coronary artery disease, hypertension, hyperlipidemia, admitted to the hospital with shortness of breath.  He was found to be influenza positive, as well as having an elevated troponin.  He was admitted for further observation.  The day after admission, he was noted to have acute change in neuro status with acute right-sided weakness.  He was found to have left MCA/ACA territory infarct.  Seen by neurology and received tPA.  On 6/10, he had worsening respiratory status requiring intubation.  He was noted to have bilateral lower lobe pneumonia.  Subsequently extubated on 6/17.   Assessment & Plan:   Principal Problem:   Influenza A Active Problems:   NSTEMI (non-ST elevated myocardial infarction) (HCC)   CAD (coronary artery disease)   Asthma   Essential hypertension   Hyperlipidemia   Dyspnea   Pleural effusion on left   Stroke (cerebrum) (HCC)   Acute respiratory failure with hypoxia (HCC)   Left MCA/ACA territory stroke -he has dysphagia, aphasia and right hemiparesis -Received tPA on 6/9 secondary to symptomatic high-grade proximal left carotid stenosis -Neurology following -Currently on dual antiplatelet therapy with aspirin Plavix  Left internal carotid artery stenosis -Seen by neuro interventional radiology who recommended outpatient follow-up for ICA stenting if patient makes meaningful neurologic improvement  Non-STEMI -Patient noted to have new ST depressions in V6 and worsening T wave inversions in inferior leads -Seen by cardiology and underwent left heart cath on 6/14 that showed multivessel disease -Recommendations were for medical management for now -He will need to follow-up with cardiology to be considered for further therapy, provided he makes further neurologic recovery  Acute respiratory failure with  hypoxia -Secondary to influenza pneumonia/left pleural effusion/increased respiratory secretions -He has been treated with antiviral/antibiotics -Continue chest physical therapy -oxygen requirements are currently at 10L and he has increased secretions requiring suctioning  -appears to be mostly upper airway -he is at high risk for re-intubation  Fever -patient noted to have recurrent fever -started on cefepime and vancomycin -blood cultures/urine culture and sputum cultures ordered -UA indicates possible infection  RLE DVT -venous dopplers show DVT in RLE that is age indeterminate -patient does not have any known prior history of DVT  -I suspect this would need to be treated as an acute DVT -discussed with neurology and it was felt reasonable to start patient on IV heparin without bolus, and also hold aspirin and plavix for now   AKI -BUN/creatinine was trending up -suspect that he may have increased fluid loss -does not appear to be volume overload -started on gentle hydration with some improvement in BUN/Cr.  Type 2 diabetes -A1c of 6.2 -Continue sliding scale insulin and Lantus  Dysphagia -Related to stroke -Currently is n.p.o. and has core track for tube feeding -may need to consider PEG tube if does not improve  Urinary retention -Has failed voiding trial twice, but per staff was not on any medications -Has been started on Cardura (cannot take tamsulosin through cortrak) -Can reattempt voiding trial tomorrow -If he fails voiding trial, he will need to keep Foley until he follows up with urology  Hypertension -stable on metoprolol  Goals of care -will consult palliative care to help further address goals care, code status and long term interventions like PEG/Trach if needed.   DVT prophylaxis: SCD's Start: 12/03/20 2310  Code Status: full code Family Communication:  I updated patient's nephew Kandis MannanOmar over the phone.  Disposition Plan: Status is:  Inpatient  Remains inpatient appropriate because:Ongoing diagnostic testing needed not appropriate for outpatient work up and Inpatient level of care appropriate due to severity of illness  Dispo: The patient is from: Home              Anticipated d/c is to:  CIR  vs. SNF              Patient currently is not medically stable to d/c.   Difficult to place patient No    Consultants:  Neurology PCCM Cardiology (signed off)  Procedures:  ETT 6/10>6/17 LHC 6/14  Antimicrobials:  Tamiflu completed 6/13 Azithromycin 6/11 > 6/14 Ceftriaxone 6/11 > 6/15  Vancomycin 6/24> Cefepime 6/24>   Subjective: Patient is somnolent today, but wakes up to voice. Reported by staff that he attempted to sit up on side of bed. Still requiring frequent respiratory suctioning. Continues to have fevers today.  Objective: Vitals:   12/18/20 1119 12/18/20 1121 12/18/20 1507 12/18/20 1510  BP:      Pulse:      Resp:      Temp:    (!) 100.7 F (38.2 C)  TempSrc:    Axillary  SpO2: 91% 94% 93%   Weight:      Height:        Intake/Output Summary (Last 24 hours) at 12/18/2020 1825 Last data filed at 12/18/2020 1805 Gross per 24 hour  Intake 2016.86 ml  Output 1625 ml  Net 391.86 ml   Filed Weights   12/16/20 0500 12/17/20 0500 12/18/20 0500  Weight: 93.9 kg 94.2 kg 94.1 kg    Examination:  General exam: somnolent but wakes up to voice Respiratory system: bilateral rhonchi Respiratory effort normal. Cardiovascular system:RRR. No murmurs, rubs, gallops. Gastrointestinal system: Abdomen is nondistended, soft and nontender. No organomegaly or masses felt. Normal bowel sounds heard. Central nervous system: right hemiplegia Extremities: No C/C/E, +pedal pulses Skin: No rashes, lesions or ulcers Psychiatry: unable to assess      Data Reviewed: I have personally reviewed following labs and imaging studies  CBC: Recent Labs  Lab 12/13/20 0342 12/14/20 0229 12/15/20 0132 12/17/20 0341  12/17/20 1833 12/18/20 0216  WBC 6.0 6.7 6.1 7.8  --  11.9*  HGB 10.7* 10.5* 11.0* 11.0* 11.9* 11.9*  HCT 35.3* 35.4* 36.6* 37.3* 35.0* 40.4  MCV 93.4 93.2 92.9 93.5  --  94.2  PLT 358 391 415* 489*  --  446*   Basic Metabolic Panel: Recent Labs  Lab 12/14/20 0229 12/15/20 0132 12/16/20 0208 12/17/20 0341 12/17/20 1833 12/18/20 0216  NA 144 142 145 144 147* 145  K 4.0 4.2 4.4 4.6 4.6 4.5  CL 103 103 108 107  --  103  CO2 33* 32 29 30  --  33*  GLUCOSE 230* 249* 189* 193*  --  163*  BUN 57* 59* 72* 79*  --  61*  CREATININE 1.08 0.84 1.23 1.26*  --  1.13  CALCIUM 8.4* 8.7* 8.7* 8.8*  --  8.8*  PHOS  --   --   --  5.9*  --   --    GFR: Estimated Creatinine Clearance: 71.9 mL/min (by C-G formula based on SCr of 1.13 mg/dL). Liver Function Tests: Recent Labs  Lab 12/17/20 0341  ALBUMIN 2.2*   No results for input(s): LIPASE, AMYLASE in the last 168 hours. No results for input(s): AMMONIA in the last 168 hours. Coagulation Profile: No  results for input(s): INR, PROTIME in the last 168 hours. Cardiac Enzymes: No results for input(s): CKTOTAL, CKMB, CKMBINDEX, TROPONINI in the last 168 hours. BNP (last 3 results) No results for input(s): PROBNP in the last 8760 hours. HbA1C: No results for input(s): HGBA1C in the last 72 hours. CBG: Recent Labs  Lab 12/18/20 0011 12/18/20 0350 12/18/20 0725 12/18/20 1107 12/18/20 1508  GLUCAP 173* 154* 140* 194* 208*   Lipid Profile: No results for input(s): CHOL, HDL, LDLCALC, TRIG, CHOLHDL, LDLDIRECT in the last 72 hours. Thyroid Function Tests: No results for input(s): TSH, T4TOTAL, FREET4, T3FREE, THYROIDAB in the last 72 hours. Anemia Panel: No results for input(s): VITAMINB12, FOLATE, FERRITIN, TIBC, IRON, RETICCTPCT in the last 72 hours. Sepsis Labs: No results for input(s): PROCALCITON, LATICACIDVEN in the last 168 hours.  Recent Results (from the past 240 hour(s))  Culture, Respiratory w Gram Stain     Status: None  (Preliminary result)   Collection Time: 12/17/20  5:58 PM   Specimen: Tracheal Aspirate; Respiratory  Result Value Ref Range Status   Specimen Description TRACHEAL ASPIRATE  Final   Special Requests NONE  Final   Gram Stain   Final    ABUNDANT WBC PRESENT,BOTH PMN AND MONONUCLEAR ABUNDANT GRAM POSITIVE COCCI IN PAIRS RARE GRAM POSITIVE RODS Performed at [facility], [address], [place], [place] [address]    Culture PENDING  Incomplete   Report Status PENDING  Incomplete  MRSA Next Gen by PCR, Nasal     Status: None   Collection Time: 12/18/20 10:38 AM   Specimen: Nasal Mucosa; Nasal Swab  Result Value Ref Range Status   MRSA by PCR Next Gen NOT DETECTED NOT DETECTED Final    Comment: (NOTE) The GeneXpert MRSA Assay (FDA approved for NASAL specimens only), is one component of a comprehensive MRSA colonization surveillance program. It is not intended to diagnose MRSA infection nor to guide or monitor treatment for MRSA infections. Test performance is not FDA approved in patients less than 19 years old. Performed at [facility], [address], [place], [place] [address]          Radiology Studies: DG Chest 1 View  Result Date: 12/17/2020 CLINICAL DATA:  Oxygen desaturation EXAM: CHEST  1 VIEW COMPARISON:  12/16/2020 FINDINGS: Cardiac shadow is stable. Postsurgical changes are again seen. Feeding catheter is noted extending into the stomach. Left basilar infiltrate is noted stable in appearance from the prior exam. Right lung base is clear when compare with the prior study. No new focal abnormality is seen. IMPRESSION: Stable left basilar infiltrate. Electronically Signed   By: Alcide Clever M.D.   On: 12/17/2020 18:34   VAS [place] LOWER EXTREMITY VENOUS (DVT)  Result Date: 12/18/2020  Lower Venous DVT Study Patient Name:  GERHARD RAPPAPORT  Date of Exam:   12/18/2020 Medical Rec #: [id number]      Accession #:    [id number] Date of Birth: 10-Mar-1951     Patient Gender: M  Patient Age:   69Y Exam Location:  [facility] Procedure:      VAS [place] LOWER EXTREMITY VENOUS (DVT) Referring Phys: [id number] [facility] Dona Walby --------------------------------------------------------------------------------  Indications: Stroke. Other Indications: Hypoxia. Comparison Study: No prior Performing Technologist: Marilynne Halsted RDMS, RVT  Examination Guidelines: A complete evaluation includes B-mode imaging, spectral Doppler, color Doppler, and power Doppler as needed of all accessible portions of each vessel. Bilateral testing is considered an integral part of a complete examination. Limited examinations for  reoccurring indications may be performed as noted. The reflux portion of the exam is performed with the patient in reverse Trendelenburg.  +---------+---------------+---------+-----------+----------+-----------------+ RIGHT    CompressibilityPhasicitySpontaneityPropertiesThrombus Aging    +---------+---------------+---------+-----------+----------+-----------------+ CFV      Full           Yes      Yes                                    +---------+---------------+---------+-----------+----------+-----------------+ SFJ      Full                                                           +---------+---------------+---------+-----------+----------+-----------------+ FV Prox  Full                                                           +---------+---------------+---------+-----------+----------+-----------------+ FV Mid   Full                                                           +---------+---------------+---------+-----------+----------+-----------------+ FV DistalFull                                                           +---------+---------------+---------+-----------+----------+-----------------+ PFV      Full                                                            +---------+---------------+---------+-----------+----------+-----------------+ POP      Full           Yes      Yes                                    +---------+---------------+---------+-----------+----------+-----------------+ PTV      Full                                                           +---------+---------------+---------+-----------+----------+-----------------+ PERO     None                                         Age Indeterminate +---------+---------------+---------+-----------+----------+-----------------+   +---------+---------------+---------+-----------+----------+--------------+ LEFT     CompressibilityPhasicitySpontaneityPropertiesThrombus Aging +---------+---------------+---------+-----------+----------+--------------+  CFV      Full           Yes      Yes                                 +---------+---------------+---------+-----------+----------+--------------+ SFJ      Full                                                        +---------+---------------+---------+-----------+----------+--------------+ FV Prox  Full                                                        +---------+---------------+---------+-----------+----------+--------------+ FV Mid   Full                                                        +---------+---------------+---------+-----------+----------+--------------+ FV DistalFull                                                        +---------+---------------+---------+-----------+----------+--------------+ PFV      Full                                                        +---------+---------------+---------+-----------+----------+--------------+ POP      Full           Yes      Yes                                 +---------+---------------+---------+-----------+----------+--------------+ PTV      Full                                                         +---------+---------------+---------+-----------+----------+--------------+ PERO     Full                                                        +---------+---------------+---------+-----------+----------+--------------+     Summary: RIGHT: - Findings consistent with age indeterminate deep vein thrombosis involving the right peroneal veins. - No cystic structure found in the popliteal fossa.  LEFT: - There is no evidence of deep vein thrombosis in the lower extremity.  - No cystic structure found in the popliteal fossa.  *  See table(s) above for measurements and observations. Electronically signed by Lemar Livings MD on 12/18/2020 at 2:41:33 PM.    Final         Scheduled Meds:  arformoterol  15 mcg Nebulization BID   aspirin  81 mg Per Tube Daily   atorvastatin  80 mg Per Tube Daily   budesonide (PULMICORT) nebulizer solution  0.5 mg Nebulization BID   chlorhexidine  15 mL Mouth Rinse BID   Chlorhexidine Gluconate Cloth  6 each Topical Daily   clopidogrel  75 mg Per Tube Daily   doxazosin  2 mg Per Tube Daily   enoxaparin (LOVENOX) injection  50 mg Subcutaneous Q24H   ezetimibe  10 mg Per Tube Daily   feeding supplement (PROSource TF)  45 mL Per Tube TID   free water  200 mL Per Tube Q4H   gabapentin  600 mg Per Tube BID   insulin aspart  0-15 Units Subcutaneous Q4H   insulin aspart  8 Units Subcutaneous Q4H   insulin glargine  15 Units Subcutaneous BID   ipratropium-albuterol  3 mL Nebulization QID   mouth rinse  15 mL Mouth Rinse q12n4p   metoprolol tartrate  25 mg Per Tube BID   montelukast  10 mg Per Tube QHS   pantoprazole sodium  40 mg Per Tube QHS   polyethylene glycol  17 g Per Tube Daily   sodium chloride flush  3 mL Intravenous Q12H   sodium chloride HYPERTONIC  4 mL Nebulization BID   Continuous Infusions:  sodium chloride     ceFEPime (MAXIPIME) IV Stopped (12/18/20 1113)   feeding supplement (VITAL 1.5 CAL) 1,000 mL (12/18/20 1205)   lactated ringers 50 mL/hr at  12/18/20 1600   vancomycin Stopped (12/18/20 1402)     LOS: 15 days    Time spent:    Erick Blinks, MD [facility]   If 7PM-7AM, please contact night-coverage [email or web address]  12/18/2020, 6:25 PM

## 2020-12-18 NOTE — Progress Notes (Signed)
ANTICOAGULATION CONSULT NOTE - Follow Up Consult  Pharmacy Consult for Heparin Indication: DVT  No Known Allergies  Patient Measurements: Height: 5' 9.02" (175.3 cm) Weight: 94.1 kg (207 lb 7.3 oz) IBW/kg (Calculated) : 70.74   Vital Signs: Temp: 100.7 F (38.2 C) (06/24 1510) Temp Source: Axillary (06/24 1510) BP: 106/64 (06/24 1021)  Labs: Recent Labs    12/16/20 0208 12/17/20 0341 12/17/20 0341 12/17/20 1833 12/18/20 0216  HGB  --  11.0*   < > 11.9* 11.9*  HCT  --  37.3*  --  35.0* 40.4  PLT  --  489*  --   --  446*  CREATININE 1.23 1.26*  --   --  1.13   < > = values in this interval not displayed.    Estimated Creatinine Clearance: 71.9 mL/min (by C-G formula based on SCr of 1.13 mg/dL).  Assessment: 70 year old male to begin heparin for a new DVT Received dose of prophylactic Lovenox at 10 AM  Goal of Therapy:  Heparin level 0.3-0.7 units/ml Monitor platelets by anticoagulation protocol: Yes   Plan:  No heparin bolus  Heparin drip at 1400 units / hr Heparin level and CBC in 8 hours  Thank you Okey Regal, PharmD  12/18/2020,6:55 PM

## 2020-12-18 NOTE — Progress Notes (Signed)
NAME:  Brandon Daniel, MRN:  [id number], DOB:  21-Dec-1950, LOS: 15 ADMISSION DATE:  12/03/2020, CONSULTATION DATE:  12/04/20 REFERRING MD:  Derry Lory, CHIEF COMPLAINT:  weakness   History of Present Illness:  70 year old man with hx of CAD, HTN, HLD admitted for SOB from influenza A on 12/03/20.  Developed R face and R sided weakness this evening and received TPA after normal head CT.  After this he developed some worsening responsiveness so another CT ordered  He required mechanical ventilation for respiratory distress and bibasilar pneumonia.  Course complicated by non-STEMI, extubated 6/17  Pertinent  Medical History  Asthma/COPD overlad CAD HLD HTN  Significant Hospital Events: Including procedures, antibiotic start and stop dates in addition to other pertinent events   6/9 admitted 6/9 evening code stroke, tpa, PCCM eval 6/10 Intubated for resp distress 6/11 Lt IJ CVL placed, levo for hypotension, started heparin drip for NSTEMI and cardiology consulted 6/14 Left Heart Cath >> multiple chronic total or near total occlusions. 6/15  started lasix to help with vent liberation 6/16 diuresed 6/17 extubated, diuresed, 5L O2 needed 6/22 increasing oxygen requirements, PCCM reconsulted  Interim History / Subjective:   Overnight, febrile to 101.3 with new leukocytosis intermittently requiring NRB.   Objective   Blood pressure 116/62, pulse (!) 111, temperature 100.3 F (37.9 C), temperature source Oral, resp. rate (!) 31, height 5' 9.02" (1.753 m), weight 94.1 kg, SpO2 90 %.    FiO2 (%):  [94 %-100 %] 100 %   Intake/Output Summary (Last 24 hours) at 12/18/2020 7026 Last data filed at 12/18/2020 0600 Gross per 24 hour  Intake 937.97 ml  Output 2025 ml  Net -1087.03 ml   Filed Weights   12/16/20 0500 12/17/20 0500 12/18/20 0500  Weight: 93.9 kg 94.2 kg 94.1 kg    Examination: Gen:      No significant distress, on 10 L NRB HEENT: ncat,sclera anicteric,  Lungs:   Mild accessory  muscle use, poor cough, diffuse rhonchi predominantly on left  CV:         Regular rate and rhythm; no murmurs Abd:     soft, non-tender; no palpable masses, no distension Skin:      Warm and dry; no rash Neuro: Aphasic, right dense hemiplegia, lethargic, follow commands, good strength on left; spontaneously moving LUE and LLE  Labs/imaging that I havepersonally reviewed  (right click and "Reselect all SmartList Selections" daily)  Chest x-ray 6/22 shows unchanged patchy airspace disease more on left than on the right CXR 6/23 > stable left basilar infiltrate, right lung base appears improved compared to CXR from 6/22, no new focal consolidations   Resolved Hospital Problem list   AKI  Assessment & Plan:  Acute hypoxic respiratory failure due to inability to clear secretions, acute pulmonary edema, Influenza A pna Possible underlying COPD. Has had improving oxygen requirements, currently on NRB. Just completed chest physiotherapy. Breath sounds with diffuse rhonchi, predominantly on left.  -Tracheobronchial toilet, improved with NT suctioning -Continue hypertonic saline nebs, chest PT via bed, flutter valve However due to his poor cough, he remains at high risk for reintubation -Continue to hold tube feeds due to increased oxygen requirements - Bilat DVT US   Fever Tmax 101.3 with mild leukocytosis. Etiology possible urinary vs respiratory. He did initially have increasing oxygen requirements; however, these have improved and chest x-ray from yesterday actually with improved aeration without focal consolidations noted. Respiratory culture with few gram positive rods, cocci and rare gram neg  rods, suspect this to be normal respiratory flora. Given urinary retention requiring foley catheter, suspect this to be source.  -UA and urine culture - F/u resp culture - Trend fever curve and CBC  Left ACA, MCA stroke s/p tPA --TTE ok, no Afib seen, ICA stenosis to be dealt with OP --Vertebral  stensois to be dealt with OP --statin, ASA, plavix -- Failed swallow again 6/23, may need PEG -Good candidate for CIR if he improves  Non ST elevation MI, history of cardiomyopathy ischemic s/p CABG, acute pulmonary edema --LHC revealed restenosed grafts and multivessel disease -Medical management per cardiology  New onset diabetes , uncontrolled HbA1C 6.2 continue Lantus 15 Q 12H ct TF coverage to 8u q 4h + SSI  Eliezer Bottom, MD Internal Medicine, PGY-2 12/18/20 8:36 AM Pager # [phone]

## 2020-12-18 NOTE — Progress Notes (Signed)
STROKE TEAM PROGRESS NOTE   INTERVAL HISTORY His sister and brother-in-law are at the bedside.  Patient developed respiratory distress yesterday with high oxygen requirements and was seen by CCM team.  He improved after nasotracheal suctioning but continues with poor ability to clear airways high risk for respiratory failure.  Patient remains alert and interactive today.  Remains aphasic but can follow simple commands. White count is elevated at 11.9 Neurological examination remains unchanged and stable; he is aphasic and follows limited commands to the RUE vital signs are stable.  Neurological exam unchanged  PERTINENT IMAGING   CT Head WO IV Contrast 10Jun2022 No acute intracranial abnormality. ASPECTS is 10.   MRI Brain WO IV Contrast 10Jun2022 Diffuse gray matter infarction in the left ACA and MCA territories.   Echocardiogram Complete 10Jun2022 Left ventricular ejection fraction, by estimation, is 55 to 60%. The left ventricle has normal function. Left ventricular diastolic parameters are indeterminate. Elevated left ventricular end-diastolic pressure.  Right ventricular systolic function is normal. The right ventricular size is normal. There is normal pulmonary artery systolic pressure. The estimated right ventricular systolic pressure is 11.0 mmHg.  The mitral valve is normal in structure. No evidence of mitral valve regurgitation. No evidence of mitral stenosis.  The aortic valve was not well visualized. Aortic valve regurgitation is not visualized. No aortic stenosis is present.  The inferior vena cava is normal in size with greater than 50% respiratory variability, suggesting right atrial pressure of 3 mmHg.   VAS US Carotid 10Jun2022 Right ICA 60-79% stenosis Left ICA 80-99% stenosis   CTA Head and Neck W WO IV Contrast 09Jun2022 Negative CTA for emergent large vessel occlusion. Extensive bulky calcified plaque about the left carotid bulb/proximal left ICA with associated  severe near occlusive stenosis. Left ICA, MCA, and ACA markedly attenuated but remain patent distally. Short-segment 70% atheromatous stenosis at the proximal cervical right ICA. Severe left and moderate right vertebral artery origin stenoses.  Vitals:   12/18/20 1021 12/18/20 1108 12/18/20 1119 12/18/20 1121  BP: 106/64     Pulse:      Resp:      Temp:  99 F (37.2 C)    TempSrc:  Axillary    SpO2:   91% 94%  Weight:      Height:        CBC:  Recent Labs  Lab 12/17/20 0341 12/17/20 1833 12/18/20 0216  WBC 7.8  --  11.9*  HGB 11.0* 11.9* 11.9*  HCT 37.3* 35.0* 40.4  MCV 93.5  --  94.2  PLT 489*  --  446*    Basic Metabolic Panel:  Recent Labs  Lab 12/17/20 0341 12/17/20 1833 12/18/20 0216  NA 144 147* 145  K 4.6 4.6 4.5  CL 107  --  103  CO2 30  --  33*  GLUCOSE 193*  --  163*  BUN 79*  --  61*  CREATININE 1.26*  --  1.13  CALCIUM 8.8*  --  8.8*  PHOS 5.9*  --   --     Lipid Panel: No results for input(s): CHOL, TRIG, HDL, CHOLHDL, VLDL, LDLCALC in the last 168 hours. HgbA1c:  Lab Results  Component Value Date   HGBA1C 6.2 (H) 12/04/2020   Urine Drug Screen: No results found for: LABOPIA, COCAINSCRNUR, LABBENZ, AMPHETMU, THCU, LABBARB  Alcohol Level No results found for: [facility]  EXAMINATION  General exam   HEENT-  Normocephalic, no lesions, without obvious abnormality.  Normal external eye and conjunctiva.  Dry mucous membranes Cardiovascular- RRR  Lungs-Breathing room air, occasionally labored, occasional wheezing Musculoskeletal-no obvious deformity or swelling Skin-warm and dry, intact  Neurologic Exam: General: Pleasant middle-age male in mild respiratory distress, sitting up in bed Mental Status: Is awake and interactive..  Globally aphasic. Able to follow few simple commands speech mostly nonverbal with tries to speak. Cranial Nerves: II: Pupils equal, round III,IV, VI: ptosis not present, left gaze preference, does not cross midline V,VII:  subtle right facial droop VIII: hearing normal bilaterally Sensory: Grimaces to pain on the right Motor: Moves left upper and lower extremities spontaneously against gravity; 0/5 on the right. Tone and bulk normal; no atrophy noted Gait: Unable to assess   ASSESSMENT/PLAN Mr. Gino Garrabrant is a 70 y.o. male with a past medical history significant for, HLD, HTN and lumbar stenosis who presented to [facility] with SOB and diagnoses with influenza A. On the day of admission he developed mild R facial droop, R arm drift and hand grip weakness along with R leg weakness. Code stroke was initiated and he received TPA.   #Left MCA/ACA territory stroke IV tPA given 09 Jun secondary to symptomatic high-grade proximal left carotid stenosis - MRI Brain: Left ACA and MCA territory stroke - CTA Head and Neck was pertinent anterior circulation stenosis w/ LICA showing severe stenosis/near occlusion. RICA was also notably stenosed. CUS revealed RICA w/60-79 % stenosis, LICA with 80-99 % stenosis - Echo: EF 55-60%, LA normal in size - Stroke labs: LDL 55, Hemoglobin A1C 6.2 - Stroke etiology is likely large vessel atherosclerosis - Continue DAPT for secondary stroke prevention with Aspirin 81 mg + Plavix 75 mg QD   # LICA stenosis - Neuro IR consulted for LICA stenosis on 6/13 for intervention - Currently neuro IR recommend outpt follow up for left ICA stenting consideration     - Agree. Would recommend watch and wait re: stent placement. More meaningful neurological improvement may be appropriate before procedure.   # NSTEMI Mr. Clasby has a known history of cardiomyopathy.Cardiology consulted for New ST depression in V6 and worsening T wave inversion in inferior leads; likely demand ischemia in the setting of known coronary artery disease (18 stents) and worsening hypoxia and acidosis. He underwent a cardiac cath on 12/08/20 that showed multivessel disease - Recommending medical treatment for now and state that  distal lesions in RCA can be stented if patient has ischemia and has improved physical condition from a neurological standpoint. - Continuing to follow cardiology guidance   # DVT Prophylaxis - Lovenox 50mg  QD - SCDs  # Essential Hypertension (History) - Home medication: HCTZ 25 QD, Atenolol 100 mg QD, Lisinopril 20 mg QD at home - SBP goal 90-140/50-85, currently pressures are trending 120s-140s/60s-70s - Continue Metoprolol 25mg  q12h, losartan 25mg  daily   # Hyperlipidemia - LDL goal is < 70. LDL is 55 and is at goal. - Continue Atorvastatin 80 mg and Zetia 10mg  daily   #Acute Respiratory Failure/ Influenza Pneumonia/Left Pleural Effusion Prior to admission Mr. Nickson reported having SOB and a cough. He was hypoxic in the ED and was found to be Influenza A positive - Intubated on 6/11 for increased WOB and hypoxia -> extubated 6/17 -> doing well on room air - CXR bilateral infiltrates and pulmonary edema - Albuterol nebs prn, Brovana BID, pulmicort neb BID, duoneb 4x daily, robitussin PRN - CCM on board   # DMII DMII diagnosis on admission - Hemoglobin A1C 6.2, goal <7 - CBGs in the 200s  -  Continue SSI   #Dysphagia In the setting of L MCA stroke. SLP assessed him on 6/10, noted dysphagia and recommended NPO.  - Extubated 6/17 -> needs cortrak with TF   #Urinary retention - Foley removed 16Jun - Voiding trial post extubation, unclear if completed  # Influenza A Pneumonia Screened for influenza given recent travel to [place] and ongoing SOB prior to admission. Noted to be febrile and hypoxic this admission. - Tamiflu 75 BID for 5 days- 12/03/20 to 12/07/20 - Treated with Azithromycin 6/11 to 6/14 - Ceftriaxone 2 g Q24 hr started 6/11-6/15   #Fever Afebrile the last 48h, 98-100 deg F - Trending CBC, so far no leukocytosis. - Increased sputum- culture w/moderate WBC few gram positive and negative rods - CXR and UA ordered 6/14 as a part of fever work up. 6/14 CXR with  slight increase in the degree of left basilar consolidation with associated effusion. 6/14 UA negative for UTI.  #Dispo - Considering Mr. Condie improvements (extubation, remaining so), improved neurological function, he can move to the neuro floor for continued care. - When medically appropriate. PT/OT suggest [facility]  Hospital day # 15  Patient remains aphasic with  dense right hemiplegia on exam.  He does have symptomatic severe left carotid stenosis but revascularization will have to wait till patient shows at least modest improvement in rehab.  He also has significant underlying coronary artery disease which also may need cardiac cath in the future.  Recommend transfer outside the ICU to neurology stepdown bed.  Mobilize out of bed.    Continue ongoing therapies.  Continue management of respiratory failure as per CCM team and primary medical team.  Stroke team will sign off.  Kindly call for questions.  Await inpatient rehab when bed available long discussion with patient's sisters at the bedside  .  Discussed with patient and sister and brother-in-law and answered questions.  Discussed with Dr. Cathi Roan. Greater than 50% time during this 25-minute visit was spent on counseling and coordination of care and discussion with care team.   Delia Heady, MD Medical Director [facility] Pager: [phone] 12/18/2020 12:25 PM

## 2020-12-18 NOTE — Progress Notes (Signed)
Pharmacy Antibiotic Note  Brandon Daniel is a 70 y.o. male admitted on 12/03/2020 with sepsis.  Pharmacy has been consulted for Vanc and Cefepime dosing. CrCL 72 ml/min  Vancomycin 1750 mg IV Q 24 hrs. Goal AUC 400-550. Expected AUC: 499 SCr used: 1.13   Plan: Start Cefepime 2 grams IV q 8hr Start vancomycin 1750 mg IV q24hr Monitor renal function, clinical status, C&S, and vanc levels as needed  Height: 5' 9.02" (175.3 cm) Weight: 94.1 kg (207 lb 7.3 oz) IBW/kg (Calculated) : 70.74  Temp (24hrs), Avg:99.8 F (37.7 C), Min:98.2 F (36.8 C), Max:101.3 F (38.5 C)  Recent Labs  Lab 12/13/20 0342 12/14/20 0229 12/15/20 0132 12/16/20 0208 12/17/20 0341 12/18/20 0216  WBC 6.0 6.7 6.1  --  7.8 11.9*  CREATININE 0.76 1.08 0.84 1.23 1.26* 1.13    Estimated Creatinine Clearance: 71.9 mL/min (by C-G formula based on SCr of 1.13 mg/dL).    No Known Allergies  Antimicrobials this admission: Cefepime 6/24 >>  Vanc 6/24 >>   Thank you for allowing pharmacy to be a part of this patient's care.  Jeanella Cara, PharmD, [facility] Clinical Pharmacist Please see AMION for all Pharmacists' Contact Phone Numbers 12/18/2020, 10:05 AM

## 2020-12-18 NOTE — Progress Notes (Signed)
Physical Therapy Treatment Patient Details Name: Brandon Daniel MRN: [id number] DOB: 04/17/1951 Today's Date: 12/18/2020    History of Present Illness [name] is a 70 y.o. male admitted 6/9 that  presented with SOB, was found to be Influenza A + and later on day of admission developed mild R facial droop, R arm drift and hand grip weakness along with R leg weakness. Left MCA and ACA CVA. Pt received tPa. VDRF 6/11-6/15. PMH: CAD, HTN, lumbar stenosis, HLD    PT Comments    Pt admitted with above diagnosis. Pt making slow progress but is maintaining ability to sit EOB and follows commands well overall.  Pt with incr O2 needs today up to [facility].  Pt tolerated sitting on EOB and left pt in 60 degree chair position at end of treatment.   Pt currently with functional limitations due to balance and endurance deficits. Pt will benefit from skilled PT to increase their independence and safety with mobility to allow discharge to the venue listed below.      Follow Up Recommendations  SNF     Equipment Recommendations  Other (comment) (TBA)    Recommendations for Other Services       Precautions / Restrictions Precautions Precautions: Fall Restrictions Weight Bearing Restrictions: No    Mobility  Bed Mobility Overal bed mobility: Needs Assistance Bed Mobility: Rolling;Sidelying to Sit;Sit to Sidelying Rolling: Max assist Sidelying to sit: Max assist     Sit to sidelying: Max assist General bed mobility comments: Pt able to assist minimally with rolling Lt and Rt, he required assist to move LEs off the EOB and to lift trunk. Incr time to get pt scooted to EOB.    Transfers                    Ambulation/Gait                 Stairs             Wheelchair Mobility    Modified Rankin (Stroke Patients Only) Modified Rankin (Stroke Patients Only) Pre-Morbid Rankin Score: No symptoms Modified Rankin: Severe disability     Balance Overall balance  assessment: Needs assistance Sitting-balance support: Single extremity supported;No upper extremity supported;Feet supported Sitting balance-Leahy Scale: Poor Sitting balance - Comments: Pt sat a total of 10 minutes at EOB with varying assist from max to min guard at times (min guard for brief periods with pt unable to sustain on his own for more than 6 seconds).  Pt losing balance all directions. holding his head to left. Postural control: Posterior lean;Right lateral lean;Left lateral lean                                  Cognition Arousal/Alertness: Awake/alert Behavior During Therapy: Flat affect Overall Cognitive Status: Impaired/Different from baseline Area of Impairment: Attention;Following commands;Problem solving                   Current Attention Level: Focused   Following Commands: Follows one step commands inconsistently;Follows one step commands with increased time     Problem Solving: Slow processing;Decreased initiation;Difficulty sequencing;Requires verbal cues;Requires tactile cues General Comments: Follows 1-step verbal commands with intermittent consistency.      Exercises General Exercises - Lower Extremity Heel Slides: AROM;Left;Supine;5 reps (P/ROM right) Straight Leg Raises: AROM;10 reps;Left;Supine Hip Flexion/Marching: AROM;Left;10 reps;Supine Toe Raises: AROM;Left;10 reps;Supine    General Comments General  comments (skin integrity, edema, etc.): Pt on 9L HFNC today with sats 91%.  BP initially 122/63, sitting 104/68, once in 60 degree chair position in bed 108/66.  HR 110-119 bpm.      Pertinent Vitals/Pain Faces Pain Scale: No hurt    Home Living                      Prior Function            PT Goals (current goals can now be found in the care plan section) Acute Rehab PT Goals Patient Stated Goal: to get better Progress towards PT goals: Progressing toward goals    Frequency    Min 3X/week      PT  Plan Frequency needs to be updated;Discharge plan needs to be updated    Co-evaluation              AM-PAC PT "6 Clicks" Mobility   Outcome Measure  Help needed turning from your back to your side while in a flat bed without using bedrails?: Total Help needed moving from lying on your back to sitting on the side of a flat bed without using bedrails?: Total Help needed moving to and from a bed to a chair (including a wheelchair)?: Total Help needed standing up from a chair using your arms (e.g., wheelchair or bedside chair)?: Total Help needed to walk in hospital room?: Total Help needed climbing 3-5 steps with a railing? : Total 6 Click Score: 6    End of Session Equipment Utilized During Treatment: Oxygen;Gait belt Activity Tolerance: Patient limited by fatigue (incr O2 needs.) Patient left: with call bell/phone within reach;with chair alarm set;in bed (in 60 degree chair position) Nurse Communication: Mobility status;Need for lift equipment PT Visit Diagnosis: Muscle weakness (generalized) (M62.81);Hemiplegia and hemiparesis Hemiplegia - Right/Left: Right Hemiplegia - dominant/non-dominant: Dominant Hemiplegia - caused by: Cerebral infarction     Time: 6803-2122 PT Time Calculation (min) (ACUTE ONLY): 21 min  Charges:  $Therapeutic Activity: 8-22 mins                     Brandon Daniel M,PT Acute Rehab Services [phone] (pager)    Brandon Daniel 12/18/2020, 1:19 PM

## 2020-12-18 NOTE — Progress Notes (Signed)
Venous duplex lower ext  has been completed. Refer to [facility] under chart review to view preliminary results.   12/18/2020  11:45 AM Brandon Daniel, Gerarda Gunther

## 2020-12-19 DIAGNOSIS — Z7189 Other specified counseling: Secondary | ICD-10-CM

## 2020-12-19 DIAGNOSIS — Z515 Encounter for palliative care: Secondary | ICD-10-CM

## 2020-12-19 LAB — RENAL FUNCTION PANEL
Albumin: 1.9 g/dL — ABNORMAL LOW (ref 3.5–5.0)
Anion gap: 8 (ref 5–15)
BUN: 49 mg/dL — ABNORMAL HIGH (ref 8–23)
CO2: 31 mmol/L (ref 22–32)
Calcium: 8.4 mg/dL — ABNORMAL LOW (ref 8.9–10.3)
Chloride: 106 mmol/L (ref 98–111)
Creatinine, Ser: 0.89 mg/dL (ref 0.61–1.24)
GFR, Estimated: >60 mL/min (ref >60)
Glucose, Bld: 220 mg/dL — ABNORMAL HIGH (ref 70–99)
Phosphorus: 3.6 mg/dL (ref 2.5–4.6)
Potassium: 3.7 mmol/L (ref 3.5–5.1)
Sodium: 145 mmol/L (ref 135–145)

## 2020-12-19 LAB — GLUCOSE, CAPILLARY
Glucose-Capillary: 131 mg/dL — ABNORMAL HIGH (ref 70–99)
Glucose-Capillary: 154 mg/dL — ABNORMAL HIGH (ref 70–99)
Glucose-Capillary: 158 mg/dL — ABNORMAL HIGH (ref 70–99)
Glucose-Capillary: 163 mg/dL — ABNORMAL HIGH (ref 70–99)
Glucose-Capillary: 163 mg/dL — ABNORMAL HIGH (ref 70–99)
Glucose-Capillary: 172 mg/dL — ABNORMAL HIGH (ref 70–99)
Glucose-Capillary: 217 mg/dL — ABNORMAL HIGH (ref 70–99)

## 2020-12-19 LAB — CBC
HCT: 33.7 % — ABNORMAL LOW (ref 39.0–52.0)
Hemoglobin: 9.5 g/dL — ABNORMAL LOW (ref 13.0–17.0)
MCH: 27 pg (ref 26.0–34.0)
MCHC: 28.2 g/dL — ABNORMAL LOW (ref 30.0–36.0)
MCV: 95.7 fL (ref 80.0–100.0)
Platelets: 440 10*3/uL — ABNORMAL HIGH (ref 150–400)
RBC: 3.52 MIL/uL — ABNORMAL LOW (ref 4.22–5.81)
RDW: 15.8 % — ABNORMAL HIGH (ref 11.5–15.5)
WBC: 8.4 10*3/uL (ref 4.0–10.5)
nRBC: 0 % (ref 0.0–0.2)

## 2020-12-19 LAB — HEPARIN LEVEL (UNFRACTIONATED)
Heparin Unfractionated: 0.42 IU/mL (ref 0.30–0.70)
Heparin Unfractionated: 0.36 IU/mL (ref 0.30–0.70)

## 2020-12-19 MED ORDER — CLOPIDOGREL BISULFATE 75 MG PO TABS
75.0000 mg | ORAL_TABLET | Freq: Every day | ORAL | Status: DC
  Administered 2020-12-19 – 2020-12-22 (×4): 75 mg
  Filled 2020-12-19 (×4): qty 1

## 2020-12-19 MED ORDER — GABAPENTIN 600 MG PO TABS
300.0000 mg | ORAL_TABLET | Freq: Two times a day (BID) | ORAL | Status: DC
  Filled 2020-12-19: qty 0.5

## 2020-12-19 MED ORDER — GABAPENTIN 250 MG/5ML PO SOLN
300.0000 mg | Freq: Two times a day (BID) | ORAL | Status: DC
  Administered 2020-12-20 – 2020-12-25 (×13): 300 mg
  Filled 2020-12-19 (×15): qty 6

## 2020-12-19 MED ORDER — ASPIRIN 81 MG PO CHEW
81.0000 mg | CHEWABLE_TABLET | Freq: Every day | ORAL | Status: DC
  Administered 2020-12-19 – 2020-12-25 (×7): 81 mg
  Filled 2020-12-19 (×7): qty 1

## 2020-12-19 NOTE — Progress Notes (Signed)
NAME:  Brandon Daniel, MRN:  [id number], DOB:  08/21/1950, LOS: 16 ADMISSION DATE:  12/03/2020, CONSULTATION DATE:  12/04/20 REFERRING MD:  Derry Lory, CHIEF COMPLAINT:  weakness   History of Present Illness:  70 year old man with hx of CAD, HTN, HLD admitted for SOB from influenza A on 12/03/20.  Developed R face and R sided weakness this evening and received TPA after normal head CT.  After this he developed some worsening responsiveness so another CT ordered  He required mechanical ventilation for respiratory distress and bibasilar pneumonia.  Course complicated by non-STEMI, extubated 6/17  Pertinent  Medical History  Asthma/COPD overlad CAD HLD HTN  Significant Hospital Events: Including procedures, antibiotic start and stop dates in addition to other pertinent events   6/9 admitted 6/9 evening code stroke, tpa, PCCM eval 6/10 Intubated for resp distress 6/11 Lt IJ CVL placed, levo for hypotension, started heparin drip for NSTEMI and cardiology consulted 6/14 Left Heart Cath >> multiple chronic total or near total occlusions. 6/15  started lasix to help with vent liberation 6/16 diuresed 6/17 extubated, diuresed, 5L O2 needed 6/22 increasing oxygen requirements, PCCM reconsulted 6/24 Overnight, febrile to 101.3 with new leukocytosis intermittently requiring NR >> empiric cefepime and vancomycin for UTI and GPC in sputum  Interim History / Subjective:   Defervesced. Remains critically ill, on 10 L high flow nasal cannula  Objective   Blood pressure (!) 125/58, pulse (!) 110, temperature 98.5 F (36.9 C), temperature source Axillary, resp. rate (!) 29, height 5' 9.02" (1.753 m), weight 94.3 kg, SpO2 100 %.        Intake/Output Summary (Last 24 hours) at 12/19/2020 0945 Last data filed at 12/19/2020 0600 Gross per 24 hour  Intake 4902.63 ml  Output 1725 ml  Net 3177.63 ml    Filed Weights   12/17/20 0500 12/18/20 0500 12/19/20 0500  Weight: 94.2 kg 94.1 kg 94.3 kg     Examination: Gen:   Chronically ill-appearing, no significant distress, on 10 L NRB HEENT: ncat,sclera anicteric,  Lungs:   No accessory muscle use, decreased breath sounds on the left CV:         Regular rate and rhythm; no murmurs Abd:     soft, non-tender; no palpable masses, no distension Skin:      Warm and dry; no rash Neuro: Aphasic, follows one-step commands, dense right hemiplegia, good strength in left  Labs/imaging that I havepersonally reviewed  (right click and "Reselect all SmartList Selections" daily)   Labs showed resolved leukocytosis, mild hyperglycemia, mild hypokalemia CXR 6/23 > stable left basilar infiltrate, right lung base appears improved compared to CXR from 6/22, no new focal consolidations   Resolved Hospital Problem list   AKI  Assessment & Plan:  Acute hypoxic respiratory failure due to inability to clear secretions, acute pulmonary edema, Influenza A pna Possible underlying COPD.  -Tracheobronchial toilet, improved with NT suctioning -Continue hypertonic saline nebs, chest PT via bed, flutter valve However due to his poor cough, he remains at high risk for reintubation   Right peroneal DVT, age-indeterminate -On IV heparin per primary service  Fever Tmax 101.3 with mild leukocytosis.  Empirically started on cefepime for UTI and vancomycin for GPC in sputum -Follow cultures  Left ACA, MCA stroke s/p tPA --TTE ok, no Afib seen --Vertebral stensois to be dealt with OP, he has symptomatic left ICA stenosis which would need intervention if he recovers --statin -6/24 aspirin and Plavix held when heparin started -Good candidate for CIR  if he improves  Dysphagia -- Failed swallow again 6/23, may need PEG -Tube feeds resumed   Non ST elevation MI, history of cardiomyopathy ischemic s/p CABG, acute pulmonary edema --LHC revealed restenosed grafts and multivessel disease -Medical management per cardiology  New onset diabetes ,  uncontrolled HbA1C 6.2 continue Lantus 15 Q 12H ct TF coverage  8u q 4h + SSI   Cyril Mourning MD. FCCP. [facility] Pager : [phone]  If no response to pager , please call [phone] until 7 pm After 7:00 pm call Elink  [phone]     12/19/20 9:45 AM

## 2020-12-19 NOTE — Progress Notes (Signed)
Patient transported on telemetry by RN. All of patients belongings transported with him. Patient received by Arlys John, RN.

## 2020-12-19 NOTE — Progress Notes (Signed)
ANTICOAGULATION CONSULT NOTE - Follow Up Consult  Pharmacy Consult for Heparin Indication: DVT   Assessment: 70 year old male to begin heparin for a new DVT Received dose of prophylactic Lovenox at 10 AM Heparin level 0.42 units/ml Hg 9.5, PTLC 440  Goal of Therapy:  Heparin level 0.3-0.7 units/ml Monitor platelets by anticoagulation protocol: Yes   Plan:  Continue Heparin drip at 1400 units / hr Heparin level in 6- 8 hours to confirm  Thanks for allowing pharmacy to be a part of this patient's care.  Talbert Cage, PharmD Clinical Pharmacist 12/19/2020,5:27 AM

## 2020-12-19 NOTE — Progress Notes (Signed)
ANTICOAGULATION CONSULT NOTE - Follow Up Consult  Pharmacy Consult for Heparin Indication: DVT   Assessment: 70 year old male to begin heparin for a new DVT Received dose of prophylactic Lovenox at 10 AM Heparin level 0.36 units/ml Hg 9.5, PTLC 440  Goal of Therapy:  Heparin level 0.3-0.7 units/ml Monitor platelets by anticoagulation protocol: Yes   Plan:  Continue Heparin drip at 1400 units / hr Monitor qam Heparin level and CBC   Thanks for allowing pharmacy to be a part of this patient's care.  Jeanella Cara, PharmD, [facility] Clinical Pharmacist Please see AMION for all Pharmacists' Contact Phone Numbers 12/19/2020, 1:59 PM

## 2020-12-19 NOTE — Progress Notes (Signed)
CPT held at this time. Pt unable to tolerate at this time. RT will start CPT in the am

## 2020-12-19 NOTE — Progress Notes (Signed)
Patint arrived to 3w-10

## 2020-12-19 NOTE — Consult Note (Signed)
Consultation Note Date: 12/19/2020   Patient Name: Brandon Daniel  DOB: 1951-01-30  MRN: [id number]  Age / Sex: 70 y.o., male  PCP: Pcp, No Referring Physician: Erick Blinks, MD  Reason for Consultation: Establishing goals of care  HPI/Patient Profile: 70 y.o. male  with past medical history of COPD, CAD, HTN, and HLD admitted on 12/03/2020 with shortness of breath from influenza A. Code stroke called on evening of admission and patient received TPA. He was intubated 6/10 for respiratory distress and treated for NSTEMI 6/11. Hd heart cath 6/14 and found to have multiple total or near total occlusions. On 6/17 he was extubated but started to have increasing oxygen requirements 6/22. Remains on HFNC. High risk for reintubation d/t weak cough. Patient with cortrak for nutrition d/t dysphagia. Patient also aphasic following CVA. PMT consulted to discuss GOC.  Clinical Assessment and Goals of Care: I have reviewed medical records including EPIC notes, labs and imaging, received report from RN, assessed the patient and then spoke with patient's nephew Kandis Mannan  to discuss diagnosis prognosis, GOC, EOL wishes, disposition and options.  Prior to discussing goals of care with nephew I attempted some discussion with patient. He does nod yes and give me permission to speak to his nephew Kandis Mannan. He also nods yes when I ask if Kandis Mannan makes medical decisions for him. He shakes head no when I ask if he is in pain. I briefly discussed goals of care asking about reintubation, possible peg tube - he nods yes to these.   Kandis Mannan is not technically next of kin - patient's brothers and sisters are. Kandis Mannan lives in [place]. Kandis Mannan shares with me that he is an Charity fundraiser and has been serving as a Quarry manager for the family - he communicates with medical team then shares information with patients brothers and sisters.   I introduced Palliative Medicine as specialized medical care for people living with  serious illness. It focuses on providing relief from the symptoms and stress of a serious illness. The goal is to improve quality of life for both the patient and the family.  Kandis Mannan shares with me that prior to hospitalization patient was very active. He had just traveled out of the country for one month - he was treated for pneumonia while he was traveling. Kandis Mannan tells me he had to come to the hospital as soon as he returned to [place].    We discussed patient's current illness and what it means in the larger context of patient's on-going co-morbidities.  Kandis Mannan tells me the medical team has communicated well with him and he has good understanding of situation.   I attempted to elicit values and goals of care important to the patient. The difference between aggressive medical intervention and comfort care was considered in light of the patient's goals of care. Advance directives, concepts specific to code status, artificial feeding and hydration, and rehospitalization were considered and discussed.  Kandis Mannan tells me he has discussed all of the above topics with patient's sisters and brothers and they are all requesting full scope care and full code. They would want reintubation if needed. They would want CPR if needed. They would also agree to peg and possibly even trach. Kandis Mannan states they are open to any interventions that would "give him a chance". Kandis Mannan tells me they feel this way because of patient's baseline physically active status. They also feel this way because patient is currently able to be awake and interact with them. He does mention that if patient  has a drastic change in mental status they would be open to further goals of care conversations.   Discussed with Kandis Mannan the importance of continued conversation with family and the medical providers regarding overall plan of care and treatment options, ensuring decisions are within the context of the patient's values and GOCs.    Questions and concerns were  addressed. The family was encouraged to call with questions or concerns.    Primary Decision Maker NEXT OF KIN - sisters and brothers though patient does nod yes that his nephew can make medical decisions for him - seems that family all make decisions together as a unit  SUMMARY OF RECOMMENDATIONS   - family request full code/full scope care - open to trach/peg if needed - pmt will continue to follow for ongoing GOC conversations  Code Status/Advance Care Planning: Full code  Discharge Planning: To Be Determined - family hopeful for CIR     Primary Diagnoses: Present on Admission:  NSTEMI (non-ST elevated myocardial infarction) (HCC)  Stroke (cerebrum) (HCC)   I have reviewed the medical record, interviewed the patient and family, and examined the patient. The following aspects are pertinent.  Past Medical History:  Diagnosis Date   Asthma-COPD overlap syndrome (HCC)    CAD (coronary artery disease)    HLD (hyperlipidemia)    HTN (hypertension)    Lumbar disc herniation with radiculopathy    Lumbar stenosis    Osteoporosis    Social History   Socioeconomic History   Marital status: Divorced    Spouse name: Not on file   Number of children: Not on file   Years of education: Not on file   Highest education level: Not on file  Occupational History   Not on file  Tobacco Use   Smoking status: Former    Packs/day: 2.00    Years: 30.00    Pack years: 60.00    Types: Cigarettes   Smokeless tobacco: Never  Substance and Sexual Activity   Alcohol use: Never   Drug use: Never   Sexual activity: Not on file  Other Topics Concern   Not on file  Social History Narrative   Not on file   Social Determinants of Health   Financial Resource Strain: Not on file  Food Insecurity: Not on file  Transportation Needs: Not on file  Physical Activity: Not on file  Stress: Not on file  Social Connections: Not on file   Family History  Problem Relation Age of Onset    Cancer Mother    CAD Father    Diabetes Other    Colon cancer Other    Prostate cancer Other    Scheduled Meds:  arformoterol  15 mcg Nebulization BID   atorvastatin  80 mg Per Tube Daily   budesonide (PULMICORT) nebulizer solution  0.5 mg Nebulization BID   chlorhexidine  15 mL Mouth Rinse BID   Chlorhexidine Gluconate Cloth  6 each Topical Daily   doxazosin  2 mg Per Tube Daily   ezetimibe  10 mg Per Tube Daily   feeding supplement (PROSource TF)  45 mL Per Tube TID   free water  200 mL Per Tube Q4H   gabapentin  600 mg Per Tube BID   insulin aspart  0-15 Units Subcutaneous Q4H   insulin aspart  8 Units Subcutaneous Q4H   insulin glargine  15 Units Subcutaneous BID   ipratropium-albuterol  3 mL Nebulization QID   mouth rinse  15 mL Mouth Rinse q12n4p  metoprolol tartrate  25 mg Per Tube BID   montelukast  10 mg Per Tube QHS   pantoprazole sodium  40 mg Per Tube QHS   polyethylene glycol  17 g Per Tube Daily   sodium chloride flush  3 mL Intravenous Q12H   sodium chloride HYPERTONIC  4 mL Nebulization BID   Continuous Infusions:  sodium chloride     ceFEPime (MAXIPIME) IV 2 g (12/19/20 1407)   feeding supplement (VITAL 1.5 CAL) 1,000 mL (12/19/20 0603)   heparin 1,400 Units/hr (12/19/20 1011)   vancomycin 1,750 mg (12/19/20 1016)   PRN Meds:.sodium chloride, acetaminophen, albuterol, guaiFENesin-dextromethorphan, nitroGLYCERIN, ondansetron (ZOFRAN) IV, senna-docusate, sodium chloride flush No Known Allergies Review of Systems  Unable to perform ROS: Patient nonverbal   Physical Exam Constitutional:      Comments: Nods/shakes head appropriately, a bit lethargic  HENT:     Head:     Comments: Cortrak in place Pulmonary:     Comments: Remains on HFNC   Vital Signs: BP 119/83   Pulse (!) 110   Temp 97.7 F (36.5 C) (Axillary)   Resp (!) 31   Ht 5' 9.02" (1.753 m)   Wt 94.3 kg   SpO2 96%   BMI 30.69 kg/m  Pain Scale: CPOT POSS *See Group Information*:  1-Acceptable,Awake and alert Pain Score: 0-No pain   SpO2: SpO2: 96 % O2 Device:SpO2: 96 % O2 Flow Rate: .O2 Flow Rate (L/min): 8 L/min  IO: Intake/output summary:  Intake/Output Summary (Last 24 hours) at 12/19/2020 1457 Last data filed at 12/19/2020 1200 Gross per 24 hour  Intake 5042.63 ml  Output 1850 ml  Net 3192.63 ml    LBM: Last BM Date: 12/19/20 Baseline Weight: Weight: 102.1 kg Most recent weight: Weight: 94.3 kg     Palliative Assessment/Data: PPS 30%    Time Total: 65 minutes Greater than 50%  of this time was spent counseling and coordinating care related to the above assessment and plan.  Gerlean Ren, DNP, AGNP-C Palliative Medicine Team [phone] Pager: [phone]

## 2020-12-19 NOTE — Progress Notes (Signed)
PROGRESS NOTE    Brandon Daniel  LNL:[id number] DOB: 1950-09-10 DOA: 12/03/2020 PCP: Pcp, No    Brief Narrative:  70 year old male with a history of coronary artery disease, hypertension, hyperlipidemia, admitted to the hospital with shortness of breath.  He was found to be influenza positive, as well as having an elevated troponin.  He was admitted for further observation.  The day after admission, he was noted to have acute change in neuro status with acute right-sided weakness.  He was found to have left MCA/ACA territory infarct.  Seen by neurology and received tPA.  On 6/10, he had worsening respiratory status requiring intubation.  He was noted to have bilateral lower lobe pneumonia.  Subsequently extubated on 6/17.   Assessment & Plan:   Principal Problem:   Influenza A Active Problems:   NSTEMI (non-ST elevated myocardial infarction) (HCC)   CAD (coronary artery disease)   Asthma   Essential hypertension   Hyperlipidemia   Dyspnea   Pleural effusion on left   Stroke (cerebrum) (HCC)   Acute respiratory failure with hypoxia (HCC)   Left MCA/ACA territory stroke -he has dysphagia, aphasia and right hemiparesis -Received tPA on 6/9 secondary to symptomatic high-grade proximal left carotid stenosis -Neurology following -Currently on dual antiplatelet therapy with aspirin Plavix  Left internal carotid artery stenosis -Seen by neuro interventional radiology who recommended outpatient follow-up for ICA stenting if patient makes meaningful neurologic improvement  Non-STEMI -Patient noted to have new ST depressions in V6 and worsening T wave inversions in inferior leads -Seen by cardiology and underwent left heart cath on 6/14 that showed multivessel disease -Recommendations were for medical management for now -He will need to follow-up with cardiology to be considered for further therapy, provided he makes further neurologic recovery  Acute respiratory failure with  hypoxia -Secondary to influenza pneumonia/left pleural effusion/increased respiratory secretions -He has been treated with antiviral/antibiotics -Continue chest physical therapy -oxygen requirements are currently at 8L and he has increased secretions requiring suctioning  -appears to be mostly upper airway -he is at high risk for re-intubation  Fever -patient noted to have recurrent fever -started on cefepime and vancomycin -blood cultures/urine culture and sputum cultures ordered -UA indicates possible infection -urine culture positive for klebsiella   RLE DVT -venous dopplers show DVT in RLE, below the knee that is age indeterminate -patient does not have any known prior history of DVT  -Discussed with Dr. Vassie Loll and it was felt reasonable to monitor for now and repeat venous dopplers in 3-5 days -if there is extension of DVT, he would need to be started on anticoagulation   AKI -BUN/creatinine was trending up -suspect that he may have increased fluid loss -does not appear to be volume overload -improved with hydration  Type 2 diabetes -A1c of 6.2 -Continue sliding scale insulin and Lantus  Dysphagia -Related to stroke -Currently is n.p.o. and has core track for tube feeding -may need to consider PEG tube if does not improve, provided respiratory status has stabilized  Urinary retention -Has failed voiding trial twice, but per staff was not on any medications -Has been started on Cardura on 6/21 (cannot take tamsulosin through cortrak) -Can reattempt voiding trial today -If he fails voiding trial, he will need to keep Foley until he follows up with urology  Hypertension -stable on metoprolol  Goals of care -seen by palliative care. Family has elected to continue with full scope of treatment   DVT prophylaxis: SCD's Start: 12/03/20 2310  Code Status: full code  Family Communication: I updated patient's nephew Kandis Mannan over the phone.  Disposition Plan: Status is:  Inpatient  Remains inpatient appropriate because:Ongoing diagnostic testing needed not appropriate for outpatient work up and Inpatient level of care appropriate due to severity of illness  Dispo: The patient is from: Home              Anticipated d/c is to:  CIR  vs. SNF              Patient currently is not medically stable to d/c.   Difficult to place patient No    Consultants:  Neurology PCCM Cardiology (signed off)  Procedures:  ETT 6/10>6/17 LHC 6/14  Antimicrobials:  Tamiflu completed 6/13 Azithromycin 6/11 > 6/14 Ceftriaxone 6/11 > 6/15  Vancomycin 6/24> Cefepime 6/24>   Subjective: Patient is currently somnolent, he was more awake earlier in the day and was participating in conversations with family/staff  Objective: Vitals:   12/19/20 0927 12/19/20 1132 12/19/20 1225 12/19/20 1240  BP: (!) 125/58  (!) 127/57 119/83  Pulse: (!) 110  99 (!) 110  Resp:   (!) 28 (!) 31  Temp:  97.7 F (36.5 C)    TempSrc:  Axillary    SpO2:   100% 96%  Weight:      Height:        Intake/Output Summary (Last 24 hours) at 12/19/2020 1507 Last data filed at 12/19/2020 1200 Gross per 24 hour  Intake 5042.63 ml  Output 1850 ml  Net 3192.63 ml   Filed Weights   12/17/20 0500 12/18/20 0500 12/19/20 0500  Weight: 94.2 kg 94.1 kg 94.3 kg    Examination:  General exam: somnolent, does not respond to voice, staff reports he was conversing earlier today Respiratory system: bilateral rhonchi. Respiratory effort normal. Cardiovascular system:RRR. No murmurs, rubs, gallops. Gastrointestinal system: Abdomen is nondistended, soft and nontender. No organomegaly or masses felt. Normal bowel sounds heard. Central nervous system: unable to assess due to mental status Extremities: No C/C/E, +pedal pulses Skin: No rashes, lesions or ulcers Psychiatry: unable to assess    Data Reviewed: I have personally reviewed following labs and imaging studies  CBC: Recent Labs  Lab  12/14/20 0229 12/15/20 0132 12/17/20 0341 12/17/20 1833 12/18/20 0216 12/19/20 0416  WBC 6.7 6.1 7.8  --  11.9* 8.4  HGB 10.5* 11.0* 11.0* 11.9* 11.9* 9.5*  HCT 35.4* 36.6* 37.3* 35.0* 40.4 33.7*  MCV 93.2 92.9 93.5  --  94.2 95.7  PLT 391 415* 489*  --  446* 440*   Basic Metabolic Panel: Recent Labs  Lab 12/15/20 0132 12/16/20 0208 12/17/20 0341 12/17/20 1833 12/18/20 0216 12/19/20 0416  NA 142 145 144 147* 145 145  K 4.2 4.4 4.6 4.6 4.5 3.7  CL 103 108 107  --  103 106  CO2 32 29 30  --  33* 31  GLUCOSE 249* 189* 193*  --  163* 220*  BUN 59* 72* 79*  --  61* 49*  CREATININE 0.84 1.23 1.26*  --  1.13 0.89  CALCIUM 8.7* 8.7* 8.8*  --  8.8* 8.4*  PHOS  --   --  5.9*  --   --  3.6   GFR: Estimated Creatinine Clearance: 91.3 mL/min (by C-G formula based on SCr of 0.89 mg/dL). Liver Function Tests: Recent Labs  Lab 12/17/20 0341 12/19/20 0416  ALBUMIN 2.2* 1.9*   No results for input(s): LIPASE, AMYLASE in the last 168 hours. No results for input(s): AMMONIA in the last  168 hours. Coagulation Profile: No results for input(s): INR, PROTIME in the last 168 hours. Cardiac Enzymes: No results for input(s): CKTOTAL, CKMB, CKMBINDEX, TROPONINI in the last 168 hours. BNP (last 3 results) No results for input(s): PROBNP in the last 8760 hours. HbA1C: No results for input(s): HGBA1C in the last 72 hours. CBG: Recent Labs  Lab 12/18/20 2324 12/19/20 0328 12/19/20 0743 12/19/20 1123 12/19/20 1506  GLUCAP 223* 217* 163* 154* 158*   Lipid Profile: No results for input(s): CHOL, HDL, LDLCALC, TRIG, CHOLHDL, LDLDIRECT in the last 72 hours. Thyroid Function Tests: No results for input(s): TSH, T4TOTAL, FREET4, T3FREE, THYROIDAB in the last 72 hours. Anemia Panel: No results for input(s): VITAMINB12, FOLATE, FERRITIN, TIBC, IRON, RETICCTPCT in the last 72 hours. Sepsis Labs: No results for input(s): PROCALCITON, LATICACIDVEN in the last 168 hours.  Recent Results  (from the past 240 hour(s))  Culture, Respiratory w Gram Stain     Status: None (Preliminary result)   Collection Time: 12/17/20  5:58 PM   Specimen: Tracheal Aspirate; Respiratory  Result Value Ref Range Status   Specimen Description TRACHEAL ASPIRATE  Final   Special Requests NONE  Final   Gram Stain   Final    ABUNDANT WBC PRESENT,BOTH PMN AND MONONUCLEAR ABUNDANT GRAM POSITIVE COCCI IN PAIRS RARE GRAM POSITIVE RODS    Culture   Final    ABUNDANT STAPHYLOCOCCUS AUREUS SUSCEPTIBILITIES TO FOLLOW Performed at [facility], [address], [place], [place] [address]    Report Status PENDING  Incomplete  Culture, Urine     Status: Abnormal (Preliminary result)   Collection Time: 12/18/20  8:29 AM   Specimen: Urine, Random  Result Value Ref Range Status   Specimen Description URINE, RANDOM  Final   Special Requests NONE  Final   Culture (A)  Final    >=100,000 COLONIES/mL KLEBSIELLA PNEUMONIAE SUSCEPTIBILITIES TO FOLLOW Performed at [facility], [address] [place], [place] [address]    Report Status PENDING  Incomplete  MRSA Next Gen by PCR, Nasal     Status: None   Collection Time: 12/18/20 10:38 AM   Specimen: Nasal Mucosa; Nasal Swab  Result Value Ref Range Status   MRSA by PCR Next Gen NOT DETECTED NOT DETECTED Final    Comment: (NOTE) The GeneXpert MRSA Assay (FDA approved for NASAL specimens only), is one component of a comprehensive MRSA colonization surveillance program. It is not intended to diagnose MRSA infection nor to guide or monitor treatment for MRSA infections. Test performance is not FDA approved in patients less than 67 years old. Performed at [facility], [address] [place], [place] [address]   Culture, blood (Routine X 2) w Reflex to ID Panel     Status: None (Preliminary result)   Collection Time: 12/18/20 11:27 AM   Specimen: BLOOD RIGHT ARM  Result Value Ref Range Status   Specimen Description BLOOD RIGHT ARM  Final    Special Requests   Final    BOTTLES DRAWN AEROBIC ONLY Blood Culture adequate volume   Culture   Final    NO GROWTH < 24 HOURS Performed at [facility], [address] [place], [place] [address]    Report Status PENDING  Incomplete  Culture, blood (Routine X 2) w Reflex to ID Panel     Status: None (Preliminary result)   Collection Time: 12/18/20 11:30 AM   Specimen: BLOOD RIGHT HAND  Result Value Ref Range Status  Specimen Description BLOOD RIGHT HAND  Final   Special Requests   Final    BOTTLES DRAWN AEROBIC ONLY Blood Culture results may not be optimal due to an inadequate volume of blood received in culture bottles   Culture   Final    NO GROWTH < 24 HOURS Performed at [facility], [address], [place], [place] [address]    Report Status PENDING  Incomplete         Radiology Studies: DG Chest 1 View  Result Date: 12/17/2020 CLINICAL DATA:  Oxygen desaturation EXAM: CHEST  1 VIEW COMPARISON:  12/16/2020 FINDINGS: Cardiac shadow is stable. Postsurgical changes are again seen. Feeding catheter is noted extending into the stomach. Left basilar infiltrate is noted stable in appearance from the prior exam. Right lung base is clear when compare with the prior study. No new focal abnormality is seen. IMPRESSION: Stable left basilar infiltrate. Electronically Signed   By: Alcide Clever M.D.   On: 12/17/2020 18:34   VAS [place] LOWER EXTREMITY VENOUS (DVT)  Result Date: 12/18/2020  Lower Venous DVT Study Patient Name:  JIOVANNY BURDELL  Date of Exam:   12/18/2020 Medical Rec #: [id number]      Accession #:    [id number] Date of Birth: [birth year]     Patient Gender: M Patient Age:   69Y Exam Location:  [facility] Procedure:      VAS [place] LOWER EXTREMITY VENOUS (DVT) Referring Phys: [id number] [facility] Zamarion Longest --------------------------------------------------------------------------------  Indications: Stroke. Other Indications: Hypoxia. Comparison Study: No prior Performing  Technologist: Marilynne Halsted RDMS, RVT  Examination Guidelines: A complete evaluation includes B-mode imaging, spectral Doppler, color Doppler, and power Doppler as needed of all accessible portions of each vessel. Bilateral testing is considered an integral part of a complete examination. Limited examinations for reoccurring indications may be performed as noted. The reflux portion of the exam is performed with the patient in reverse Trendelenburg.  +---------+---------------+---------+-----------+----------+-----------------+ RIGHT    CompressibilityPhasicitySpontaneityPropertiesThrombus Aging    +---------+---------------+---------+-----------+----------+-----------------+ CFV      Full           Yes      Yes                                    +---------+---------------+---------+-----------+----------+-----------------+ SFJ      Full                                                           +---------+---------------+---------+-----------+----------+-----------------+ FV Prox  Full                                                           +---------+---------------+---------+-----------+----------+-----------------+ FV Mid   Full                                                           +---------+---------------+---------+-----------+----------+-----------------+ FV DistalFull                                                           +---------+---------------+---------+-----------+----------+-----------------+  PFV      Full                                                           +---------+---------------+---------+-----------+----------+-----------------+ POP      Full           Yes      Yes                                    +---------+---------------+---------+-----------+----------+-----------------+ PTV      Full                                                            +---------+---------------+---------+-----------+----------+-----------------+ PERO     None                                         Age Indeterminate +---------+---------------+---------+-----------+----------+-----------------+   +---------+---------------+---------+-----------+----------+--------------+ LEFT     CompressibilityPhasicitySpontaneityPropertiesThrombus Aging +---------+---------------+---------+-----------+----------+--------------+ CFV      Full           Yes      Yes                                 +---------+---------------+---------+-----------+----------+--------------+ SFJ      Full                                                        +---------+---------------+---------+-----------+----------+--------------+ FV Prox  Full                                                        +---------+---------------+---------+-----------+----------+--------------+ FV Mid   Full                                                        +---------+---------------+---------+-----------+----------+--------------+ FV DistalFull                                                        +---------+---------------+---------+-----------+----------+--------------+ PFV      Full                                                        +---------+---------------+---------+-----------+----------+--------------+  POP      Full           Yes      Yes                                 +---------+---------------+---------+-----------+----------+--------------+ PTV      Full                                                        +---------+---------------+---------+-----------+----------+--------------+ PERO     Full                                                        +---------+---------------+---------+-----------+----------+--------------+     Summary: RIGHT: - Findings consistent with age indeterminate deep vein thrombosis involving the right peroneal  veins. - No cystic structure found in the popliteal fossa.  LEFT: - There is no evidence of deep vein thrombosis in the lower extremity.  - No cystic structure found in the popliteal fossa.  *See table(s) above for measurements and observations. Electronically signed by Lemar LivingsBrandon Cain MD on 12/18/2020 at 2:41:33 PM.    Final         Scheduled Meds:  arformoterol  15 mcg Nebulization BID   atorvastatin  80 mg Per Tube Daily   budesonide (PULMICORT) nebulizer solution  0.5 mg Nebulization BID   chlorhexidine  15 mL Mouth Rinse BID   Chlorhexidine Gluconate Cloth  6 each Topical Daily   doxazosin  2 mg Per Tube Daily   ezetimibe  10 mg Per Tube Daily   feeding supplement (PROSource TF)  45 mL Per Tube TID   free water  200 mL Per Tube Q4H   gabapentin  600 mg Per Tube BID   insulin aspart  0-15 Units Subcutaneous Q4H   insulin aspart  8 Units Subcutaneous Q4H   insulin glargine  15 Units Subcutaneous BID   ipratropium-albuterol  3 mL Nebulization QID   mouth rinse  15 mL Mouth Rinse q12n4p   metoprolol tartrate  25 mg Per Tube BID   montelukast  10 mg Per Tube QHS   pantoprazole sodium  40 mg Per Tube QHS   polyethylene glycol  17 g Per Tube Daily   sodium chloride flush  3 mL Intravenous Q12H   sodium chloride HYPERTONIC  4 mL Nebulization BID   Continuous Infusions:  sodium chloride     ceFEPime (MAXIPIME) IV 2 g (12/19/20 1407)   feeding supplement (VITAL 1.5 CAL) 1,000 mL (12/19/20 0603)   heparin 1,400 Units/hr (12/19/20 1011)   vancomycin 1,750 mg (12/19/20 1016)     LOS: 16 days    Time spent: 35mins    Erick BlinksJehanzeb Milinda Sweeney, MD [facility]   If 7PM-7AM, please contact night-coverage [email or web address]  12/19/2020, 3:07 PM

## 2020-12-20 DIAGNOSIS — J1520 Pneumonia due to staphylococcus, unspecified: Secondary | ICD-10-CM

## 2020-12-20 LAB — GLUCOSE, CAPILLARY
Glucose-Capillary: 195 mg/dL — ABNORMAL HIGH (ref 70–99)
Glucose-Capillary: 201 mg/dL — ABNORMAL HIGH (ref 70–99)
Glucose-Capillary: 215 mg/dL — ABNORMAL HIGH (ref 70–99)
Glucose-Capillary: 149 mg/dL — ABNORMAL HIGH (ref 70–99)
Glucose-Capillary: 152 mg/dL — ABNORMAL HIGH (ref 70–99)

## 2020-12-20 LAB — BASIC METABOLIC PANEL
Anion gap: 6 (ref 5–15)
BUN: 46 mg/dL — ABNORMAL HIGH (ref 8–23)
CO2: 29 mmol/L (ref 22–32)
Calcium: 8.2 mg/dL — ABNORMAL LOW (ref 8.9–10.3)
Chloride: 113 mmol/L — ABNORMAL HIGH (ref 98–111)
Creatinine, Ser: 0.84 mg/dL (ref 0.61–1.24)
GFR, Estimated: >60 mL/min (ref >60)
Glucose, Bld: 198 mg/dL — ABNORMAL HIGH (ref 70–99)
Potassium: 4.5 mmol/L (ref 3.5–5.1)
Sodium: 148 mmol/L — ABNORMAL HIGH (ref 135–145)

## 2020-12-20 LAB — CBC
HCT: 31.2 % — ABNORMAL LOW (ref 39.0–52.0)
Hemoglobin: 9.3 g/dL — ABNORMAL LOW (ref 13.0–17.0)
MCH: 28.1 pg (ref 26.0–34.0)
MCHC: 29.8 g/dL — ABNORMAL LOW (ref 30.0–36.0)
MCV: 94.3 fL (ref 80.0–100.0)
Platelets: 428 10*3/uL — ABNORMAL HIGH (ref 150–400)
RBC: 3.31 MIL/uL — ABNORMAL LOW (ref 4.22–5.81)
RDW: 15.6 % — ABNORMAL HIGH (ref 11.5–15.5)
WBC: 6.9 10*3/uL (ref 4.0–10.5)
nRBC: 0 % (ref 0.0–0.2)

## 2020-12-20 LAB — URINE CULTURE: Culture: >=100000 — AB

## 2020-12-20 LAB — CULTURE, RESPIRATORY W GRAM STAIN

## 2020-12-20 LAB — HEPARIN LEVEL (UNFRACTIONATED): Heparin Unfractionated: <0.1 IU/mL — ABNORMAL LOW (ref 0.30–0.70)

## 2020-12-20 MED ORDER — SODIUM CHLORIDE 0.9 % IV SOLN
1.0000 g | Freq: Three times a day (TID) | INTRAVENOUS | Status: DC
  Administered 2020-12-20 – 2020-12-23 (×9): 1 g via INTRAVENOUS
  Filled 2020-12-20 (×12): qty 1

## 2020-12-20 NOTE — Progress Notes (Signed)
PROGRESS NOTE    Brandon Daniel  ZOX:[id number] DOB: [birth year] DOA: 12/03/2020 PCP: Pcp, No    Brief Narrative:  70 year old male with a history of coronary artery disease, hypertension, hyperlipidemia, admitted to the hospital with shortness of breath.  He was found to be influenza positive, as well as having an elevated troponin.  He was admitted for further observation.  The day after admission, he was noted to have acute change in neuro status with acute right-sided weakness.  He was found to have left MCA/ACA territory infarct.  Seen by neurology and received tPA.  On 6/10, he had worsening respiratory status requiring intubation.  He was noted to have bilateral lower lobe pneumonia.  Subsequently extubated on 6/17.   Assessment & Plan:   Principal Problem:   Influenza A Active Problems:   NSTEMI (non-ST elevated myocardial infarction) (HCC)   CAD (coronary artery disease)   Asthma   Essential hypertension   Hyperlipidemia   Dyspnea   Pleural effusion on left   Stroke (cerebrum) (HCC)   Acute respiratory failure with hypoxia (HCC)   Left MCA/ACA territory stroke -he has dysphagia, aphasia and right hemiparesis -Received tPA on 6/9 secondary to symptomatic high-grade proximal left carotid stenosis -Neurology following -Currently on dual antiplatelet therapy with aspirin Plavix  Left internal carotid artery stenosis -Seen by neuro interventional radiology who recommended outpatient follow-up for ICA stenting if patient makes meaningful neurologic improvement  Non-STEMI -Patient noted to have new ST depressions in V6 and worsening T wave inversions in inferior leads -Seen by cardiology and underwent left heart cath on 6/14 that showed multivessel disease -Recommendations were for medical management for now -He will need to follow-up with cardiology to be considered for further therapy, provided he makes further neurologic recovery  Acute respiratory failure with  hypoxia -Secondary to pneumonia/left pleural effusion/increased respiratory secretions -He was previously treated with antivirals for influenza as well as completed a course of antibiotics -Repeat findings indicate possible new staph pneumonia -Continue chest physical therapy -oxygen requirements are currently at 3L and continues to have increased secretions requiring suctioning  -appears to be mostly upper airway -he is at high risk for re-intubation  ESBL Klebsiella UTI -Currently on meropenem -We will continue with current treatments, overall fevers appear to be improving  MSSA pneumonia -discontinue vancomycin since he is already on meropenem -continue pulmonary hygiene   RLE peroneal DVT -venous dopplers show DVT in RLE, below the knee that is age indeterminate -patient does not have any known prior history of DVT  -Discussed with Dr. Vassie LollAlva and it was felt reasonable to monitor for now and repeat venous dopplers in 3-5 days -if there is extension of DVT, he would need to be started on anticoagulation   AKI -BUN/creatinine was trending up -suspect that he may have increased fluid loss -does not appear to be volume overload -improved with hydration  Type 2 diabetes -A1c of 6.2 -Continue sliding scale insulin and Lantus  Dysphagia -Related to stroke -Currently is n.p.o. and has core track for tube feeding -may need to consider PEG tube if does not improve, provided respiratory status has stabilized  Urinary retention -Has failed voiding trial twice, but per staff was not on any medications -Has been started on Cardura on 6/21 (cannot take tamsulosin through cortrak) -Reattempted voiding trial on 6/25 and patient appears to be passing urine -Currently has condom catheter in place  Hypertension -stable on metoprolol  Goals of care -seen by palliative care. Family has elected to continue  with full scope of treatment   DVT prophylaxis: SCD's Start: 12/03/20  2310  Code Status: full code Family Communication: I updated patient's nephew Kandis Mannan over the phone.  Disposition Plan: Status is: Inpatient  Remains inpatient appropriate because:Ongoing diagnostic testing needed not appropriate for outpatient work up and Inpatient level of care appropriate due to severity of illness  Dispo: The patient is from: Home              Anticipated d/c is to:  CIR  vs. SNF              Patient currently is not medically stable to d/c.   Difficult to place patient No    Consultants:  Neurology PCCM Cardiology (signed off)  Procedures:  ETT 6/10>6/17 LHC 6/14  Antimicrobials:  Tamiflu completed 6/13 Azithromycin 6/11 > 6/14 Ceftriaxone 6/11 > 6/15  Vancomycin 6/24> Cefepime 6/24>   Subjective: Patient wakes up to voice.  Says that he is uncomfortable from congestion in his nose.  Denies any other complaints.  Objective: Vitals:   12/20/20 1140 12/20/20 1201 12/20/20 1552 12/20/20 1600  BP: 129/60 134/64 (!) 121/57 127/60  Pulse: 90 80 97 (!) 101  Resp: (!) 21 (!) 21 (!) 25 20  Temp: 99.8 F (37.7 C)   99.6 F (37.6 C)  TempSrc: Oral   Oral  SpO2: 95% 96% 94% 96%  Weight:      Height:        Intake/Output Summary (Last 24 hours) at 12/20/2020 1617 Last data filed at 12/20/2020 1500 Gross per 24 hour  Intake 1203.29 ml  Output 900 ml  Net 303.29 ml   Filed Weights   12/17/20 0500 12/18/20 0500 12/19/20 0500  Weight: 94.2 kg 94.1 kg 94.3 kg    Examination:  General exam: Sleeping on my arrival, wakes up to voice Respiratory system: Bilateral rhonchi. Respiratory effort normal. Cardiovascular system:RRR. No murmurs, rubs, gallops. Gastrointestinal system: Abdomen is nondistended, soft and nontender. No organomegaly or masses felt. Normal bowel sounds heard. Central nervous system: Aphasic, dense hemiplegia on the right Extremities: No C/C/E, +pedal pulses Skin: No rashes, lesions or ulcers Psychiatry: Unable to  assess     Data Reviewed: I have personally reviewed following labs and imaging studies  CBC: Recent Labs  Lab 12/15/20 0132 12/17/20 0341 12/17/20 1833 12/18/20 0216 12/19/20 0416 12/20/20 0249  WBC 6.1 7.8  --  11.9* 8.4 6.9  HGB 11.0* 11.0* 11.9* 11.9* 9.5* 9.3*  HCT 36.6* 37.3* 35.0* 40.4 33.7* 31.2*  MCV 92.9 93.5  --  94.2 95.7 94.3  PLT 415* 489*  --  446* 440* 428*   Basic Metabolic Panel: Recent Labs  Lab 12/16/20 0208 12/17/20 0341 12/17/20 1833 12/18/20 0216 12/19/20 0416 12/20/20 0249  NA 145 144 147* 145 145 148*  K 4.4 4.6 4.6 4.5 3.7 4.5  CL 108 107  --  103 106 113*  CO2 29 30  --  33* 31 29  GLUCOSE 189* 193*  --  163* 220* 198*  BUN 72* 79*  --  61* 49* 46*  CREATININE 1.23 1.26*  --  1.13 0.89 0.84  CALCIUM 8.7* 8.8*  --  8.8* 8.4* 8.2*  PHOS  --  5.9*  --   --  3.6  --    GFR: Estimated Creatinine Clearance: 96.7 mL/min (by C-G formula based on SCr of 0.84 mg/dL). Liver Function Tests: Recent Labs  Lab 12/17/20 0341 12/19/20 0416  ALBUMIN 2.2* 1.9*   No results  for input(s): LIPASE, AMYLASE in the last 168 hours. No results for input(s): AMMONIA in the last 168 hours. Coagulation Profile: No results for input(s): INR, PROTIME in the last 168 hours. Cardiac Enzymes: No results for input(s): CKTOTAL, CKMB, CKMBINDEX, TROPONINI in the last 168 hours. BNP (last 3 results) No results for input(s): PROBNP in the last 8760 hours. HbA1C: No results for input(s): HGBA1C in the last 72 hours. CBG: Recent Labs  Lab 12/19/20 2339 12/20/20 0359 12/20/20 0911 12/20/20 1142 12/20/20 1557  GLUCAP 131* 195* 201* 215* 149*   Lipid Profile: No results for input(s): CHOL, HDL, LDLCALC, TRIG, CHOLHDL, LDLDIRECT in the last 72 hours. Thyroid Function Tests: No results for input(s): TSH, T4TOTAL, FREET4, T3FREE, THYROIDAB in the last 72 hours. Anemia Panel: No results for input(s): VITAMINB12, FOLATE, FERRITIN, TIBC, IRON, RETICCTPCT in the last  72 hours. Sepsis Labs: No results for input(s): PROCALCITON, LATICACIDVEN in the last 168 hours.  Recent Results (from the past 240 hour(s))  Culture, Respiratory w Gram Stain     Status: None   Collection Time: 12/17/20  5:58 PM   Specimen: Tracheal Aspirate; Respiratory  Result Value Ref Range Status   Specimen Description TRACHEAL ASPIRATE  Final   Special Requests NONE  Final   Gram Stain   Final    ABUNDANT WBC PRESENT,BOTH PMN AND MONONUCLEAR ABUNDANT GRAM POSITIVE COCCI IN PAIRS RARE GRAM POSITIVE RODS Performed at [facility], [address], [place], [place] [address]    Culture ABUNDANT STAPHYLOCOCCUS AUREUS  Final   Report Status 12/20/2020 FINAL  Final   Organism ID, Bacteria STAPHYLOCOCCUS AUREUS  Final      Susceptibility   Staphylococcus aureus - MIC*    CIPROFLOXACIN <=0.5 SENSITIVE Sensitive     ERYTHROMYCIN >=8 RESISTANT Resistant     GENTAMICIN <=0.5 SENSITIVE Sensitive     OXACILLIN 0.5 SENSITIVE Sensitive     TETRACYCLINE <=1 SENSITIVE Sensitive     VANCOMYCIN <=0.5 SENSITIVE Sensitive     TRIMETH/SULFA <=10 SENSITIVE Sensitive     CLINDAMYCIN RESISTANT Resistant     RIFAMPIN <=0.5 SENSITIVE Sensitive     Inducible Clindamycin POSITIVE Resistant     * ABUNDANT STAPHYLOCOCCUS AUREUS  Culture, Urine     Status: Abnormal   Collection Time: 12/18/20  8:29 AM   Specimen: Urine, Random  Result Value Ref Range Status   Specimen Description URINE, RANDOM  Final   Special Requests   Final    NONE Performed at [facility], [address] [place], [place] [address]    Culture (A)  Final    >=100,000 COLONIES/mL KLEBSIELLA PNEUMONIAE Confirmed Extended Spectrum Beta-Lactamase Producer (ESBL).  In bloodstream infections from ESBL organisms, carbapenems are preferred over piperacillin/tazobactam. They are shown to have a lower risk of mortality.    Report Status 12/20/2020 FINAL  Final   Organism ID, Bacteria KLEBSIELLA PNEUMONIAE (A)  Final       Susceptibility   Klebsiella pneumoniae - MIC*    AMPICILLIN >=32 RESISTANT Resistant     CEFAZOLIN >=64 RESISTANT Resistant     CEFEPIME 2 SENSITIVE Sensitive     CEFTRIAXONE >=64 RESISTANT Resistant     CIPROFLOXACIN 0.5 SENSITIVE Sensitive     GENTAMICIN <=1 SENSITIVE Sensitive     IMIPENEM <=0.25 SENSITIVE Sensitive     NITROFURANTOIN 64 INTERMEDIATE Intermediate     TRIMETH/SULFA >=320 RESISTANT Resistant     AMPICILLIN/SULBACTAM 8 SENSITIVE Sensitive     PIP/TAZO <=4 SENSITIVE Sensitive     * >=  100,000 COLONIES/mL KLEBSIELLA PNEUMONIAE  MRSA Next Gen by PCR, Nasal     Status: None   Collection Time: 12/18/20 10:38 AM   Specimen: Nasal Mucosa; Nasal Swab  Result Value Ref Range Status   MRSA by PCR Next Gen NOT DETECTED NOT DETECTED Final    Comment: (NOTE) The GeneXpert MRSA Assay (FDA approved for NASAL specimens only), is one component of a comprehensive MRSA colonization surveillance program. It is not intended to diagnose MRSA infection nor to guide or monitor treatment for MRSA infections. Test performance is not FDA approved in patients less than 58 years old. Performed at [facility], [address], [place], [place] [address]   Culture, blood (Routine X 2) w Reflex to ID Panel     Status: None (Preliminary result)   Collection Time: 12/18/20 11:27 AM   Specimen: BLOOD RIGHT ARM  Result Value Ref Range Status   Specimen Description BLOOD RIGHT ARM  Final   Special Requests   Final    BOTTLES DRAWN AEROBIC ONLY Blood Culture adequate volume   Culture   Final    NO GROWTH 2 DAYS Performed at [facility], [address], [place], [place] [address]    Report Status PENDING  Incomplete  Culture, blood (Routine X 2) w Reflex to ID Panel     Status: None (Preliminary result)   Collection Time: 12/18/20 11:30 AM   Specimen: BLOOD RIGHT HAND  Result Value Ref Range Status   Specimen Description BLOOD RIGHT HAND  Final   Special Requests   Final     BOTTLES DRAWN AEROBIC ONLY Blood Culture results may not be optimal due to an inadequate volume of blood received in culture bottles   Culture   Final    NO GROWTH 2 DAYS Performed at [facility], [address] [place], [place] [address]    Report Status PENDING  Incomplete         Radiology Studies: No results found.      Scheduled Meds:  arformoterol  15 mcg Nebulization BID   aspirin  81 mg Per Tube Daily   atorvastatin  80 mg Per Tube Daily   budesonide (PULMICORT) nebulizer solution  0.5 mg Nebulization BID   chlorhexidine  15 mL Mouth Rinse BID   Chlorhexidine Gluconate Cloth  6 each Topical Daily   clopidogrel  75 mg Per Tube Daily   doxazosin  2 mg Per Tube Daily   ezetimibe  10 mg Per Tube Daily   feeding supplement (PROSource TF)  45 mL Per Tube TID   free water  200 mL Per Tube Q4H   gabapentin  300 mg Per Tube BID   insulin aspart  0-15 Units Subcutaneous Q4H   insulin aspart  8 Units Subcutaneous Q4H   insulin glargine  15 Units Subcutaneous BID   ipratropium-albuterol  3 mL Nebulization QID   mouth rinse  15 mL Mouth Rinse q12n4p   metoprolol tartrate  25 mg Per Tube BID   montelukast  10 mg Per Tube QHS   pantoprazole sodium  40 mg Per Tube QHS   polyethylene glycol  17 g Per Tube Daily   sodium chloride flush  3 mL Intravenous Q12H   sodium chloride HYPERTONIC  4 mL Nebulization BID   Continuous Infusions:  sodium chloride     feeding supplement (VITAL 1.5 CAL) 1,000 mL (12/19/20 1802)   meropenem (MERREM) IV Stopped (12/20/20 1440)   vancomycin Stopped (  12/20/20 1254)     LOS: 17 days    Time spent:    Erick Blinks, MD [facility]   If 7PM-7AM, please contact night-coverage [email or web address]  12/20/2020, 4:17 PM

## 2020-12-20 NOTE — Progress Notes (Signed)
RT note. Patient refusing all nebulizer's at this time. Asked patient twice to confirm he did not want these treatments he said yes. Medications left in patients room if he decides to change his mind. Patient currently on 3L [place] sat 92% w/ stable VS. CPT done at this time for 10 mins. RT will continue to monitor.

## 2020-12-20 NOTE — Progress Notes (Signed)
Occupational Therapy Treatment Patient Details Name: Kaleel Schmieder MRN: [id number] DOB: [birth year] Today's Date: 12/20/2020    History of present illness Mr. Eyan Hagood is a 70 y.o. male admitted 6/9 that  presented with SOB, was found to be Influenza A + and later on day of admission developed mild R facial droop, R arm drift and hand grip weakness along with R leg weakness. Left MCA and ACA CVA. Pt received tPa. VDRF 6/11-6/15. PMH: CAD, HTN, lumbar stenosis, HLD   OT comments  Pt. Seen for skilled OT treatment session.  Awake and alert for session.  Following one step commands with notable consistency during session.  Sustained attention to grooming task of washing face also min/mod a.  Pt. Turning and looking towards R with cues.  Continue with acute stated OT goals.   Follow Up Recommendations  CIR    Equipment Recommendations  None recommended by OT    Recommendations for Other Services Rehab consult    Precautions / Restrictions Precautions Precautions: Fall       Mobility Bed Mobility                    Transfers                      Balance                                           ADL either performed or assessed with clinical judgement   ADL       Grooming: Wash/dry face;Moderate assistance;Bed level Grooming Details (indicate cue type and reason): Able to grasp washcloth from therapist and wash face. Cues for termination of task.                               General ADL Comments: increased attention to taks, following one step commands consistently moving head to L/R for continued work for R side     Biochemist, clinical      Cognition Arousal/Alertness: Awake/alert Behavior During Therapy: Flat affect Overall Cognitive Status: Impaired/Different from baseline Area of Impairment: Attention;Following commands;Problem solving                   Current Attention Level:  Focused   Following Commands: Follows one step commands inconsistently;Follows one step commands with increased time     Problem Solving: Slow processing;Decreased initiation;Difficulty sequencing;Requires verbal cues;Requires tactile cues General Comments: Follows 1-step verbal commands with intermittent consistency.        Exercises Other Exercises Other Exercises: pt. able to follow one step commands and engage in LUE AROM, PROM performed to R hand for edema management. repositioned at end of session to promote elevation also   Shoulder Instructions       General Comments      Pertinent Vitals/ Pain       Pain Assessment: No/denies pain  Home Living                                          Prior Functioning/Environment              Frequency  Min 2X/week  Progress Toward Goals  OT Goals(current goals can now be found in the care plan section)  Progress towards OT goals: Progressing toward goals     Plan Discharge plan remains appropriate;Frequency remains appropriate    Co-evaluation                 AM-PAC OT "6 Clicks" Daily Activity     Outcome Measure   Help from another person eating meals?: Total Help from another person taking care of personal grooming?: A Lot Help from another person toileting, which includes using toliet, bedpan, or urinal?: Total Help from another person bathing (including washing, rinsing, drying)?: Total Help from another person to put on and taking off regular upper body clothing?: Total Help from another person to put on and taking off regular lower body clothing?: Total 6 Click Score: 7    End of Session    OT Visit Diagnosis: Unsteadiness on feet (R26.81);Hemiplegia and hemiparesis;Cognitive communication deficit (R41.841) Hemiplegia - Right/Left: Right Hemiplegia - dominant/non-dominant: Dominant Hemiplegia - caused by: Cerebral infarction   Activity Tolerance Patient tolerated  treatment well   Patient Left in bed;with bed alarm set;with call bell/phone within reach;Other (comment) (L mitten restraint, cna states not needed on R ue)   Nurse Communication          Time: 1388-7195 OT Time Calculation (min): 8 min  Charges: OT General Charges $OT Visit: 1 Visit OT Treatments $Therapeutic Activity: 8-22 mins  Boneta Lucks, COTA/L Acute Rehabilitation [phone]    Alessandra Bevels Lorraine-COTA/L 12/20/2020, 10:54 AM

## 2020-12-20 NOTE — Progress Notes (Addendum)
NAME:  Brandon Daniel, MRN:  [id number], DOB:  [birth year], LOS: 17 ADMISSION DATE:  12/03/2020, CONSULTATION DATE:  12/04/20 REFERRING MD:  Derry Lory, CHIEF COMPLAINT:  weakness   History of Present Illness:  70 year old man with hx of CAD, HTN, HLD admitted for SOB from influenza A on 12/03/20.  Developed R face and R sided weakness this evening and received TPA after normal head CT.  After this he developed some worsening responsiveness so another CT ordered  He required mechanical ventilation for respiratory distress and bibasilar pneumonia.  Course complicated by non-STEMI, extubated 6/17  Pertinent  Medical History  Asthma/COPD overlad CAD HLD HTN  Significant Hospital Events: Including procedures, antibiotic start and stop dates in addition to other pertinent events   6/9 admitted 6/9 evening code stroke, tpa, PCCM eval 6/10 Intubated for resp distress 6/11 Lt IJ CVL placed, levo for hypotension, started heparin drip for NSTEMI and cardiology consulted 6/14 Left Heart Cath >> multiple chronic total or near total occlusions. 6/15  started lasix to help with vent liberation 6/16 diuresed 6/17 extubated, diuresed, 5L O2 needed 6/22 increasing oxygen requirements, PCCM reconsulted 6/24 Overnight, febrile to 101.3 with new leukocytosis intermittently requiring NR >> empiric cefepime and vancomycin for UTI and GPC in sputum 6/25 transferred to floor  Interim History / Subjective:   Low-grade fever Good urine output   Objective   Blood pressure 140/70, pulse (!) 103, temperature 99.4 F (37.4 C), resp. rate (!) 28, height 5' 9.02" (1.753 m), weight 94.3 kg, SpO2 97 %.        Intake/Output Summary (Last 24 hours) at 12/20/2020 1103 Last data filed at 12/20/2020 1100 Gross per 24 hour  Intake 1160.67 ml  Output 1625 ml  Net -464.33 ml    Filed Weights   12/17/20 0500 12/18/20 0500 12/19/20 0500  Weight: 94.2 kg 94.1 kg 94.3 kg    Examination: Gen:   Chronically  ill-appearing, no significant distress, on 10 L NRB HEENT: ncat,sclera anicteric,  Lungs:   Decreased breath sounds bilateral, no accessory muscle use  CV:         S1 S2 regular, no murmurs Abd:     soft, non-tender; no palpable masses, no distension Skin:      Warm and dry; no rash Neuro: Aphasic, follows one-step commands, dense right hemiplegia, good strength in left  Labs/imaging that I havepersonally reviewed  (right click and "Reselect all SmartList Selections" daily)   Labs showed resolved leukocytosis, mild hyperglycemia, mild hypernatremia  CXR 6/23 > stable left basilar infiltrate, right lung base appears improved compared to CXR from 6/22, no new focal consolidations   Urine culture 6/24 Klebsiella ESBL +  Sputum culture 6/23 staff aureus  Resolved Hospital Problem list   AKI  Assessment & Plan:  Acute hypoxic respiratory failure due to inability to clear secretions, acute pulmonary edema, Influenza A pna Possible underlying COPD.  -Tracheobronchial toilet, improved with NT suctioning -Continue hypertonic saline nebs, chest PT via bed, flutter valve -Oxygen requirements have decreased so hopefully will continue to improve   Right peroneal DVT, age-indeterminate -IV heparin stopped, can consider strategy of serial duplex and use anticoagulation only if extension of clot  ESBL Klebsiella UTI/staph pneumonia -Change from cefepime to meropenem -Continue vancomycin until clarified whether MRSA or not -Has failed trial of void and required indwelling Foley, can attempt again  Left ACA, MCA stroke s/p tPA --TTE ok, no Afib seen --Vertebral stensois to be dealt with OP, he has symptomatic  left ICA stenosis which would need intervention if he recovers --statin -6/24 aspirin and Plavix held when heparin started -Good candidate for CIR if he improves  Dysphagia -- Failed swallow again 6/23, may need PEG -Tube feeds resumed   Non ST elevation MI, history of  cardiomyopathy ischemic s/p CABG, acute pulmonary edema --LHC revealed restenosed grafts and multivessel disease -Medical management per cardiology  New onset diabetes , uncontrolled HbA1C 6.2 continue Lantus 15 Q 12H ct TF coverage  8u q 4h + SSI  PCCM will be available as needed  Cyril Mourning MD. FCCP. [facility] Pager : [phone]  If no response to pager , please call [phone] until 7 pm After 7:00 pm call Elink  [phone]     12/20/20 11:03 AM

## 2020-12-20 NOTE — Progress Notes (Signed)
Pharmacy Antibiotic Note  Brandon Daniel is a 70 y.o. male admitted on 12/03/2020 with ESBL Klebsiella UTI.  Pharmacy has been consulted for meropenem dosing.  Plan: Meropenem 1gm IV q8h Monitor for clinical course, LOT, and deescalation.   Height: 5' 9.02" (175.3 cm) Weight: 94.3 kg (207 lb 14.3 oz) IBW/kg (Calculated) : 70.74  Temp (24hrs), Avg:99.3 F (37.4 C), Min:97.7 F (36.5 C), Max:100.6 F (38.1 C)  Recent Labs  Lab 12/15/20 0132 12/16/20 0208 12/17/20 0341 12/18/20 0216 12/19/20 0416 12/20/20 0249  WBC 6.1  --  7.8 11.9* 8.4 6.9  CREATININE 0.84 1.23 1.26* 1.13 0.89 0.84    Estimated Creatinine Clearance: 96.7 mL/min (by C-G formula based on SCr of 0.84 mg/dL).    No Known Allergies  Antimicrobials this admission: Tamiflu 6/11 >> 6/15 CTX 6/11 >> 6/16 Azith 6/11 >> 6/14 Cefepime 6/24 >> 6/26 Vanc 6/24 >>  Meropenem 6/26>>  Dose adjustments this admission: None   Microbiology results: 6/9 Influenza A+ 6/10 MRSA PCR - negative 6/12 TA - negative 6/23 TA - Staph aureus 6/24 Urine - >100,000 ESBL Kleb Pneumoniae 6/24 Bld - ngtd  Yehudit Fulginiti A. Jeanella Craze, PharmD, BCPS, FNKF Clinical Pharmacist [place] Please utilize Amion for appropriate phone number to reach the unit pharmacist [facility])  12/20/2020 11:24 AM

## 2020-12-21 LAB — CBC
HCT: 32.5 % — ABNORMAL LOW (ref 39.0–52.0)
Hemoglobin: 9.5 g/dL — ABNORMAL LOW (ref 13.0–17.0)
MCH: 27.5 pg (ref 26.0–34.0)
MCHC: 29.2 g/dL — ABNORMAL LOW (ref 30.0–36.0)
MCV: 93.9 fL (ref 80.0–100.0)
Platelets: 382 10*3/uL (ref 150–400)
RBC: 3.46 MIL/uL — ABNORMAL LOW (ref 4.22–5.81)
RDW: 15 % (ref 11.5–15.5)
WBC: 4.9 10*3/uL (ref 4.0–10.5)
nRBC: 0 % (ref 0.0–0.2)

## 2020-12-21 LAB — COMPREHENSIVE METABOLIC PANEL
ALT: 100 U/L — ABNORMAL HIGH (ref 0–44)
AST: 93 U/L — ABNORMAL HIGH (ref 15–41)
Albumin: 1.7 g/dL — ABNORMAL LOW (ref 3.5–5.0)
Alkaline Phosphatase: 71 U/L (ref 38–126)
Anion gap: 5 (ref 5–15)
BUN: 33 mg/dL — ABNORMAL HIGH (ref 8–23)
CO2: 29 mmol/L (ref 22–32)
Calcium: 8.1 mg/dL — ABNORMAL LOW (ref 8.9–10.3)
Chloride: 110 mmol/L (ref 98–111)
Creatinine, Ser: 0.68 mg/dL (ref 0.61–1.24)
GFR, Estimated: >60 mL/min (ref >60)
Glucose, Bld: 199 mg/dL — ABNORMAL HIGH (ref 70–99)
Potassium: 3.8 mmol/L (ref 3.5–5.1)
Sodium: 144 mmol/L (ref 135–145)
Total Bilirubin: 0.5 mg/dL (ref 0.3–1.2)
Total Protein: 5.9 g/dL — ABNORMAL LOW (ref 6.5–8.1)

## 2020-12-21 LAB — GLUCOSE, CAPILLARY
Glucose-Capillary: 156 mg/dL — ABNORMAL HIGH (ref 70–99)
Glucose-Capillary: 165 mg/dL — ABNORMAL HIGH (ref 70–99)
Glucose-Capillary: 177 mg/dL — ABNORMAL HIGH (ref 70–99)
Glucose-Capillary: 185 mg/dL — ABNORMAL HIGH (ref 70–99)
Glucose-Capillary: 190 mg/dL — ABNORMAL HIGH (ref 70–99)
Glucose-Capillary: 192 mg/dL — ABNORMAL HIGH (ref 70–99)
Glucose-Capillary: 213 mg/dL — ABNORMAL HIGH (ref 70–99)

## 2020-12-21 NOTE — Progress Notes (Signed)
During hand-off/shift change report with the evening RN, the patient had a brief tremor in his left hand, arm and mild facial twitching lasting 10-15 seconds.   No post-itcal symptoms notes, vitals stable, patient alert immediately following event. Night RN states she will continue to monitor and call the doctor if noted again.

## 2020-12-21 NOTE — Plan of Care (Signed)
  Problem: Education: Goal: Knowledge of General Education information will improve Description: Including pain rating scale, medication(s)/side effects and non-pharmacologic comfort measures Outcome: Not Progressing   Problem: Health Behavior/Discharge Planning: Goal: Ability to manage health-related needs will improve Outcome: Not Progressing   Problem: Clinical Measurements: Goal: Ability to maintain clinical measurements within normal limits will improve Outcome: Not Progressing Goal: Will remain free from infection Outcome: Not Progressing Goal: Diagnostic test results will improve Outcome: Not Progressing Goal: Respiratory complications will improve Outcome: Not Progressing Goal: Cardiovascular complication will be avoided Outcome: Not Progressing   Problem: Activity: Goal: Risk for activity intolerance will decrease Outcome: Not Progressing   Problem: Nutrition: Goal: Adequate nutrition will be maintained Outcome: Not Progressing   Problem: Coping: Goal: Level of anxiety will decrease Outcome: Not Progressing   Problem: Elimination: Goal: Will not experience complications related to bowel motility Outcome: Not Progressing Goal: Will not experience complications related to urinary retention Outcome: Not Progressing   Problem: Pain Managment: Goal: General experience of comfort will improve Outcome: Not Progressing   Problem: Safety: Goal: Ability to remain free from injury will improve Outcome: Not Progressing   Problem: Skin Integrity: Goal: Risk for impaired skin integrity will decrease Outcome: Not Progressing   Problem: Education: Goal: Knowledge of disease or condition will improve Outcome: Not Progressing Goal: Knowledge of secondary prevention will improve Outcome: Not Progressing Goal: Knowledge of patient specific risk factors addressed and post discharge goals established will improve Outcome: Not Progressing   Problem: Coping: Goal: Will  verbalize positive feelings about self Outcome: Not Progressing Goal: Will identify appropriate support needs Outcome: Not Progressing   Problem: Health Behavior/Discharge Planning: Goal: Ability to manage health-related needs will improve Outcome: Not Progressing

## 2020-12-21 NOTE — Progress Notes (Signed)
PROGRESS NOTE    Brandon Daniel  IRC:[id number] DOB: 02-Jun-1951 DOA: 12/03/2020 PCP: Pcp, No    Brief Narrative:  70 year old male with a history of coronary artery disease, hypertension, hyperlipidemia, admitted to the hospital with shortness of breath.  He was found to be influenza positive, as well as having an elevated troponin.  He was admitted for further observation.  The day after admission, he was noted to have acute change in neuro status with acute right-sided weakness.  He was found to have left MCA/ACA territory infarct.  Seen by neurology and received tPA.  On 6/10, he had worsening respiratory status requiring intubation.  He was noted to have bilateral lower lobe pneumonia.  Subsequently extubated on 6/17. Transferred to [facility] on 6/22.   Assessment & Plan:   Principal Problem:   Influenza A Active Problems:   NSTEMI (non-ST elevated myocardial infarction) (HCC)   CAD (coronary artery disease)   Asthma   Essential hypertension   Hyperlipidemia   Dyspnea   Pleural effusion on left   Stroke (cerebrum) (HCC)   Acute respiratory failure with hypoxia (HCC)   Left MCA/ACA territory stroke -he has dysphagia, aphasia and right hemiplegia -Received tPA on 6/9 secondary to symptomatic high-grade proximal left carotid stenosis -Neurology following -Currently on dual antiplatelet therapy with aspirin Plavix  Left internal carotid artery stenosis -Seen by neuro interventional radiology who recommended outpatient follow-up for ICA stenting if patient makes meaningful neurologic improvement  Non-STEMI -Patient noted to have new ST depressions in V6 and worsening T wave inversions in inferior leads -Seen by cardiology and underwent left heart cath on 6/14 that showed multivessel disease -Recommendations were for medical management for now -He will need to follow-up with cardiology to be considered for further therapy, provided he makes further neurologic recovery  Acute  respiratory failure with hypoxia -Secondary to pneumonia/left pleural effusion/increased respiratory secretions -He was previously treated with antivirals for influenza as well as completed a course of antibiotics -Repeat findings indicate possible new staph pneumonia -Continue chest physical therapy -oxygen requirements are currently at 2L and continues to have increased secretions requiring suctioning  -he is at high risk for recurrent aspiration  ESBL Klebsiella UTI -Currently on meropenem -We will continue with current treatments, overall fevers appear to be improving  MSSA pneumonia -discontinue vancomycin since he is already on meropenem -continue pulmonary hygiene, still requiring suctioning by RT   RLE peroneal DVT -venous dopplers show DVT in RLE, below the knee that is age indeterminate -patient does not have any known prior history of DVT  -Discussed with Dr. Vassie Loll and it was felt reasonable to monitor for now and repeat venous dopplers in 3-5 days -if there is extension of DVT, he would need to be started on anticoagulation -will repeat tomorrow   AKI -BUN/creatinine had been trending up -suspect that he may have increased fluid loss -does not appear to be volume overload -improved with hydration  Type 2 diabetes -A1c of 6.2 -Continue sliding scale insulin and Lantus  Dysphagia -Related to stroke -Currently is n.p.o. and has core track for tube feeding -may need to consider PEG tube if does not improve, provided respiratory status has stabilized -speech therapy following  Urinary retention -Has failed voiding trial twice, but per staff was not on any medications -Has been started on Cardura on 6/21 (cannot take tamsulosin through cortrak) -Reattempted voiding trial on 6/25 and patient appears to be passing urine -Currently has condom catheter in place  Hypertension -stable on metoprolol  Goals of care -seen by palliative care. Family has elected to  continue with full scope of treatment   DVT prophylaxis: SCD's Start: 12/03/20 2310  Code Status: full code Family Communication: discussed with niece and brother-in-law, Greggory Stallion at the bedside Disposition Plan: Status is: Inpatient  Remains inpatient appropriate because:Ongoing diagnostic testing needed not appropriate for outpatient work up and Inpatient level of care appropriate due to severity of illness  Dispo: The patient is from: Home              Anticipated d/c is to:  CIR  vs. SNF              Patient currently is not medically stable to d/c.   Difficult to place patient No    Consultants:  Neurology (signed off) PCCM (signed off) Cardiology (signed off)  Procedures:  ETT 6/10>6/17 LHC 6/14  Antimicrobials:  Tamiflu completed 6/13 Azithromycin 6/11 > 6/14 Ceftriaxone 6/11 > 6/15  Vancomycin 6/24> 6/26 Cefepime 6/24 > 6/26 Meropenem 6/26>   Subjective: Patient is sitting up in the bed.  He continues to have productive cough, but cough effort is weak.  He is following some basic commands.  Still aphasic.  Objective: Vitals:   12/21/20 0824 12/21/20 1029 12/21/20 1127 12/21/20 1128  BP:  137/76 136/74   Pulse:  98 87   Resp:   19   Temp:   99.1 F (37.3 C)   TempSrc:      SpO2: 100%  98% 100%  Weight:      Height:        Intake/Output Summary (Last 24 hours) at 12/21/2020 1222 Last data filed at 12/21/2020 1000 Gross per 24 hour  Intake 260.64 ml  Output 600 ml  Net -339.36 ml   Filed Weights   12/17/20 0500 12/18/20 0500 12/19/20 0500  Weight: 94.2 kg 94.1 kg 94.3 kg    Examination:  General exam: Alert, awake, no distress Respiratory system: bilateral rhonchi. Respiratory effort normal. Cardiovascular system:RRR. No murmurs, rubs, gallops. Gastrointestinal system: Abdomen is nondistended, soft and nontender. No organomegaly or masses felt. Normal bowel sounds heard. Central nervous system: awake, able to follow simple commands, aphasic,  dense right hemiplegia Extremities: No C/C/E, +pedal pulses Skin: No rashes, lesions or ulcers Psychiatry:unable to assess   Data Reviewed: I have personally reviewed following labs and imaging studies  CBC: Recent Labs  Lab 12/17/20 0341 12/17/20 1833 12/18/20 0216 12/19/20 0416 12/20/20 0249 12/21/20 0350  WBC 7.8  --  11.9* 8.4 6.9 4.9  HGB 11.0* 11.9* 11.9* 9.5* 9.3* 9.5*  HCT 37.3* 35.0* 40.4 33.7* 31.2* 32.5*  MCV 93.5  --  94.2 95.7 94.3 93.9  PLT 489*  --  446* 440* 428* 382   Basic Metabolic Panel: Recent Labs  Lab 12/17/20 0341 12/17/20 1833 12/18/20 0216 12/19/20 0416 12/20/20 0249 12/21/20 0350  NA 144 147* 145 145 148* 144  K 4.6 4.6 4.5 3.7 4.5 3.8  CL 107  --  103 106 113* 110  CO2 30  --  33* 31 29 29   GLUCOSE 193*  --  163* 220* 198* 199*  BUN 79*  --  61* 49* 46* 33*  CREATININE 1.26*  --  1.13 0.89 0.84 0.68  CALCIUM 8.8*  --  8.8* 8.4* 8.2* 8.1*  PHOS 5.9*  --   --  3.6  --   --    GFR: Estimated Creatinine Clearance: 101.5 mL/min (by C-G formula based on SCr of 0.68 mg/dL). Liver  Function Tests: Recent Labs  Lab 12/17/20 0341 12/19/20 0416 12/21/20 0350  AST  --   --  93*  ALT  --   --  100*  ALKPHOS  --   --  71  BILITOT  --   --  0.5  PROT  --   --  5.9*  ALBUMIN 2.2* 1.9* 1.7*   No results for input(s): LIPASE, AMYLASE in the last 168 hours. No results for input(s): AMMONIA in the last 168 hours. Coagulation Profile: No results for input(s): INR, PROTIME in the last 168 hours. Cardiac Enzymes: No results for input(s): CKTOTAL, CKMB, CKMBINDEX, TROPONINI in the last 168 hours. BNP (last 3 results) No results for input(s): PROBNP in the last 8760 hours. HbA1C: No results for input(s): HGBA1C in the last 72 hours. CBG: Recent Labs  Lab 12/20/20 1952 12/21/20 0037 12/21/20 0351 12/21/20 0748 12/21/20 1124  GLUCAP 152* 190* 185* 165* 192*   Lipid Profile: No results for input(s): CHOL, HDL, LDLCALC, TRIG, CHOLHDL,  LDLDIRECT in the last 72 hours. Thyroid Function Tests: No results for input(s): TSH, T4TOTAL, FREET4, T3FREE, THYROIDAB in the last 72 hours. Anemia Panel: No results for input(s): VITAMINB12, FOLATE, FERRITIN, TIBC, IRON, RETICCTPCT in the last 72 hours. Sepsis Labs: No results for input(s): PROCALCITON, LATICACIDVEN in the last 168 hours.  Recent Results (from the past 240 hour(s))  Culture, Respiratory w Gram Stain     Status: None   Collection Time: 12/17/20  5:58 PM   Specimen: Tracheal Aspirate; Respiratory  Result Value Ref Range Status   Specimen Description TRACHEAL ASPIRATE  Final   Special Requests NONE  Final   Gram Stain   Final    ABUNDANT WBC PRESENT,BOTH PMN AND MONONUCLEAR ABUNDANT GRAM POSITIVE COCCI IN PAIRS RARE GRAM POSITIVE RODS Performed at [facility], [address], [place], [place] [address]    Culture ABUNDANT STAPHYLOCOCCUS AUREUS  Final   Report Status 12/20/2020 FINAL  Final   Organism ID, Bacteria STAPHYLOCOCCUS AUREUS  Final      Susceptibility   Staphylococcus aureus - MIC*    CIPROFLOXACIN <=0.5 SENSITIVE Sensitive     ERYTHROMYCIN >=8 RESISTANT Resistant     GENTAMICIN <=0.5 SENSITIVE Sensitive     OXACILLIN 0.5 SENSITIVE Sensitive     TETRACYCLINE <=1 SENSITIVE Sensitive     VANCOMYCIN <=0.5 SENSITIVE Sensitive     TRIMETH/SULFA <=10 SENSITIVE Sensitive     CLINDAMYCIN RESISTANT Resistant     RIFAMPIN <=0.5 SENSITIVE Sensitive     Inducible Clindamycin POSITIVE Resistant     * ABUNDANT STAPHYLOCOCCUS AUREUS  Culture, Urine     Status: Abnormal   Collection Time: 12/18/20  8:29 AM   Specimen: Urine, Random  Result Value Ref Range Status   Specimen Description URINE, RANDOM  Final   Special Requests   Final    NONE Performed at [facility], [address], [place], [place] [address]    Culture (A)  Final    >=100,000 COLONIES/mL KLEBSIELLA PNEUMONIAE Confirmed Extended Spectrum Beta-Lactamase Producer (ESBL).  In  bloodstream infections from ESBL organisms, carbapenems are preferred over piperacillin/tazobactam. They are shown to have a lower risk of mortality.    Report Status 12/20/2020 FINAL  Final   Organism ID, Bacteria KLEBSIELLA PNEUMONIAE (A)  Final      Susceptibility   Klebsiella pneumoniae - MIC*    AMPICILLIN >=32 RESISTANT Resistant     CEFAZOLIN >=64 RESISTANT Resistant     CEFEPIME 2  SENSITIVE Sensitive     CEFTRIAXONE >=64 RESISTANT Resistant     CIPROFLOXACIN 0.5 SENSITIVE Sensitive     GENTAMICIN <=1 SENSITIVE Sensitive     IMIPENEM <=0.25 SENSITIVE Sensitive     NITROFURANTOIN 64 INTERMEDIATE Intermediate     TRIMETH/SULFA >=320 RESISTANT Resistant     AMPICILLIN/SULBACTAM 8 SENSITIVE Sensitive     PIP/TAZO <=4 SENSITIVE Sensitive     * >=100,000 COLONIES/mL KLEBSIELLA PNEUMONIAE  MRSA Next Gen by PCR, Nasal     Status: None   Collection Time: 12/18/20 10:38 AM   Specimen: Nasal Mucosa; Nasal Swab  Result Value Ref Range Status   MRSA by PCR Next Gen NOT DETECTED NOT DETECTED Final    Comment: (NOTE) The GeneXpert MRSA Assay (FDA approved for NASAL specimens only), is one component of a comprehensive MRSA colonization surveillance program. It is not intended to diagnose MRSA infection nor to guide or monitor treatment for MRSA infections. Test performance is not FDA approved in patients less than 68 years old. Performed at [facility], [address] [place], [place] [address]   Culture, blood (Routine X 2) w Reflex to ID Panel     Status: None (Preliminary result)   Collection Time: 12/18/20 11:27 AM   Specimen: BLOOD RIGHT ARM  Result Value Ref Range Status   Specimen Description BLOOD RIGHT ARM  Final   Special Requests   Final    BOTTLES DRAWN AEROBIC ONLY Blood Culture adequate volume   Culture   Final    NO GROWTH 3 DAYS Performed at [facility], [address] [place], [place] [address]    Report Status PENDING  Incomplete  Culture, blood  (Routine X 2) w Reflex to ID Panel     Status: None (Preliminary result)   Collection Time: 12/18/20 11:30 AM   Specimen: BLOOD RIGHT HAND  Result Value Ref Range Status   Specimen Description BLOOD RIGHT HAND  Final   Special Requests   Final    BOTTLES DRAWN AEROBIC ONLY Blood Culture results may not be optimal due to an inadequate volume of blood received in culture bottles   Culture   Final    NO GROWTH 3 DAYS Performed at [facility], [address] [place], [place] [address]    Report Status PENDING  Incomplete         Radiology Studies: No results found.      Scheduled Meds:  arformoterol  15 mcg Nebulization BID   aspirin  81 mg Per Tube Daily   atorvastatin  80 mg Per Tube Daily   budesonide (PULMICORT) nebulizer solution  0.5 mg Nebulization BID   chlorhexidine  15 mL Mouth Rinse BID   Chlorhexidine Gluconate Cloth  6 each Topical Daily   clopidogrel  75 mg Per Tube Daily   doxazosin  2 mg Per Tube Daily   ezetimibe  10 mg Per Tube Daily   feeding supplement (PROSource TF)  45 mL Per Tube TID   free water  200 mL Per Tube Q4H   gabapentin  300 mg Per Tube BID   insulin aspart  0-15 Units Subcutaneous Q4H   insulin aspart  8 Units Subcutaneous Q4H   insulin glargine  15 Units Subcutaneous BID   ipratropium-albuterol  3 mL Nebulization QID   mouth rinse  15 mL Mouth Rinse q12n4p   metoprolol tartrate  25 mg Per Tube BID   montelukast  10 mg Per Tube QHS  pantoprazole sodium  40 mg Per Tube QHS   polyethylene glycol  17 g Per Tube Daily   sodium chloride flush  3 mL Intravenous Q12H   Continuous Infusions:  sodium chloride     feeding supplement (VITAL 1.5 CAL) 1,000 mL (12/21/20 0652)   meropenem (MERREM) IV 1 g (12/21/20 0647)     LOS: 18 days    Time spent:    Erick Blinks, MD [facility]   If 7PM-7AM, please contact night-coverage [email or web address]  12/21/2020, 12:22 PM

## 2020-12-21 NOTE — Progress Notes (Signed)
Physical Therapy Treatment Patient Details Name: Brandon Daniel MRN: [id number] DOB: May 26, 1951 Today's Date: 12/21/2020    History of Present Illness [name] is a 70 y.o. male admitted 6/9 that  presented with SOB, was found to be Influenza A + and later on day of admission developed mild R facial droop, R arm drift and hand grip weakness along with R leg weakness. Left MCA and ACA CVA. Pt received tPa. VDRF 6/11-6/15. PMH: CAD, HTN, lumbar stenosis, HLD    PT Comments    Pt required +2 max assist supine to sit. Pt sat EOB x 15 minutes, primarily max assist but brief episodes of min guard. Pt tends to push with LUE and demonstrates improved stability with L hand placed in lap. Reaching activities and LLE exercises in sitting. Sit to stand attempt +2 total assist. Able to clear bottom from bed but unable to attain upright stance, heavy R lean. +2 max assist to return to bed. RN assisted with rolling R/L for bed linen change. Fatigue noted. Pt with good effort during today's session.    Follow Up Recommendations  SNF     Equipment Recommendations  Other (comment) (TBD)    Recommendations for Other Services       Precautions / Restrictions Precautions Precautions: Fall;Other (comment) Precaution Comments: cortrak, R hemi    Mobility  Bed Mobility Overal bed mobility: Needs Assistance Bed Mobility: Rolling;Supine to Sit;Sit to Sidelying Rolling: Max assist   Supine to sit: +2 for physical assistance;Max assist   Sit to sidelying: +2 for physical assistance;Max assist General bed mobility comments: assist for all aspects of mobility, cues for sequencing, increased time to maximize pt participation    Transfers Overall transfer level: Needs assistance Equipment used: 2 person hand held assist Transfers: Sit to/from Stand Sit to Stand: Total assist;+2 physical assistance         General transfer comment: assist at gait belt and bed pad under hips. Able to clear  bottom from bed but unable to attain upright stance.  Ambulation/Gait             General Gait Details: unable   Stairs             Wheelchair Mobility    Modified Rankin (Stroke Patients Only) Modified Rankin (Stroke Patients Only) Pre-Morbid Rankin Score: No symptoms Modified Rankin: Severe disability     Balance Overall balance assessment: Needs assistance Sitting-balance support: No upper extremity supported;Single extremity supported;Feet supported Sitting balance-Leahy Scale: Poor Sitting balance - Comments: Pt sat EOB x 15 minutes with varying assist, min guard to max assist. Cues to keep L hand in lap as pt tends to to push with LUE. Reaching activities seated. Postural control: Posterior lean;Right lateral lean;Left lateral lean                                  Cognition Arousal/Alertness: Awake/alert Behavior During Therapy: Flat affect Overall Cognitive Status: Impaired/Different from baseline Area of Impairment: Attention;Following commands;Problem solving                   Current Attention Level: Focused   Following Commands: Follows one step commands inconsistently;Follows one step commands with increased time     Problem Solving: Slow processing;Difficulty sequencing;Requires verbal cues;Requires tactile cues;Decreased initiation General Comments: shaking head yes/no but no verbal communication. Pt had removed O2 and disconnected tube feed on arrival.  Exercises General Exercises - Lower Extremity Long Arc Quad: AROM;Left;10 reps;Seated Hip Flexion/Marching: AROM;Left;10 reps;Seated    General Comments General comments (skin integrity, edema, etc.): 2L O2. VSS      Pertinent Vitals/Pain Pain Assessment: No/denies pain    Home Living                      Prior Function            PT Goals (current goals can now be found in the care plan section) Acute Rehab PT Goals Patient Stated Goal: not  stated Progress towards PT goals: Progressing toward goals    Frequency    Min 3X/week      PT Plan Current plan remains appropriate    Co-evaluation              AM-PAC PT "6 Clicks" Mobility   Outcome Measure  Help needed turning from your back to your side while in a flat bed without using bedrails?: A Lot Help needed moving from lying on your back to sitting on the side of a flat bed without using bedrails?: Total Help needed moving to and from a bed to a chair (including a wheelchair)?: Total Help needed standing up from a chair using your arms (e.g., wheelchair or bedside chair)?: Total Help needed to walk in hospital room?: Total Help needed climbing 3-5 steps with a railing? : Total 6 Click Score: 7    End of Session Equipment Utilized During Treatment: Oxygen;Gait belt Activity Tolerance: Patient tolerated treatment well Patient left: in bed;with call bell/phone within reach;with nursing/sitter in room Nurse Communication: Mobility status;Need for lift equipment PT Visit Diagnosis: Muscle weakness (generalized) (M62.81);Hemiplegia and hemiparesis Hemiplegia - dominant/non-dominant: Dominant Hemiplegia - caused by: Cerebral infarction     Time: 3128-1188 PT Time Calculation (min) (ACUTE ONLY): 34 min  Charges:  $Therapeutic Activity: 23-37 mins                     Aida Raider, PT  Office # [phone] Pager [phone]    Ilda Foil 12/21/2020, 11:32 AM

## 2020-12-21 NOTE — Progress Notes (Signed)
  Speech Language Pathology Treatment: Dysphagia  Patient Details Name: Brandon Daniel MRN: [id number] DOB: 05/06/1951 Today's Date: 12/21/2020 Time: 3810-1751 SLP Time Calculation (min) (ACUTE ONLY): 19 min  Assessment / Plan / Recommendation Clinical Impression  Pt seen for dysphagia tx with ice chips and 1/2 tsp thin/puree given post oral care with mod multimodal cues provided for increasing sustained attention and improving accuracy/speed of swallow with wet vocal quality, congested delayed cough and delay in the initiation of the swallow present during presentations.  Oral suctioning provided after puree consistency d/t anterior oral pooling and right buccal pocketing during trial.  Discussed potential for PEG placement with niece/pt given current swallowing function.  Family is reluctant, so objective assessment may be beneficial if pt is able to transfer given SLP discretion after next tx session if assessment would provide valuable information re: swallow function.   ST will continue to f/u in acute setting for potential MBSS and/or continued dysphagia tx/cog/linguistic tx.     HPI HPI: Brandon Daniel is a 70 y.o. male with a past medical history significant for, HLD, HTN and lumbar stenosis who presented to [facility] with SOB and diagnoses with influenza A. On the day of admission he developed mild R facial droop, R arm drift and hand grip weakness along with R leg weakness. Code stroke was initiated and he received TPA.      SLP Plan  Continue with current plan of care;MBS;New goals to be determined pending instrumental study       Recommendations  Diet recommendations: NPO Medication Administration: Via alternative means                Oral Care Recommendations: Oral care QID Follow up Recommendations: Skilled Nursing facility;24 hour supervision/assistance SLP Visit Diagnosis: Dysphagia, oropharyngeal phase (R13.12) Plan: Continue with current plan of care;MBS;New goals to be  determined pending instrumental study                       Tressie Stalker, M.S., CCC-SLP 12/21/2020, 3:10 PM

## 2020-12-22 ENCOUNTER — Inpatient Hospital Stay (HOSPITAL_COMMUNITY): Payer: Medicare (Managed Care)

## 2020-12-22 DIAGNOSIS — I82401 Acute embolism and thrombosis of unspecified deep veins of right lower extremity: Secondary | ICD-10-CM

## 2020-12-22 DIAGNOSIS — I25119 Atherosclerotic heart disease of native coronary artery with unspecified angina pectoris: Secondary | ICD-10-CM | POA: Diagnosis not present

## 2020-12-22 DIAGNOSIS — J101 Influenza due to other identified influenza virus with other respiratory manifestations: Secondary | ICD-10-CM | POA: Diagnosis not present

## 2020-12-22 DIAGNOSIS — J9601 Acute respiratory failure with hypoxia: Secondary | ICD-10-CM | POA: Diagnosis not present

## 2020-12-22 DIAGNOSIS — I63232 Cerebral infarction due to unspecified occlusion or stenosis of left carotid arteries: Secondary | ICD-10-CM | POA: Diagnosis not present

## 2020-12-22 LAB — COMPREHENSIVE METABOLIC PANEL
ALT: 123 U/L — ABNORMAL HIGH (ref 0–44)
AST: 101 U/L — ABNORMAL HIGH (ref 15–41)
Albumin: 1.9 g/dL — ABNORMAL LOW (ref 3.5–5.0)
Alkaline Phosphatase: 68 U/L (ref 38–126)
Anion gap: 6 (ref 5–15)
BUN: 30 mg/dL — ABNORMAL HIGH (ref 8–23)
CO2: 30 mmol/L (ref 22–32)
Calcium: 8.5 mg/dL — ABNORMAL LOW (ref 8.9–10.3)
Chloride: 106 mmol/L (ref 98–111)
Creatinine, Ser: 0.64 mg/dL (ref 0.61–1.24)
GFR, Estimated: >60 mL/min (ref >60)
Glucose, Bld: 144 mg/dL — ABNORMAL HIGH (ref 70–99)
Potassium: 4 mmol/L (ref 3.5–5.1)
Sodium: 142 mmol/L (ref 135–145)
Total Bilirubin: 0.3 mg/dL (ref 0.3–1.2)
Total Protein: 6.3 g/dL — ABNORMAL LOW (ref 6.5–8.1)

## 2020-12-22 LAB — GLUCOSE, CAPILLARY
Glucose-Capillary: 147 mg/dL — ABNORMAL HIGH (ref 70–99)
Glucose-Capillary: 166 mg/dL — ABNORMAL HIGH (ref 70–99)
Glucose-Capillary: 166 mg/dL — ABNORMAL HIGH (ref 70–99)
Glucose-Capillary: 175 mg/dL — ABNORMAL HIGH (ref 70–99)
Glucose-Capillary: 186 mg/dL — ABNORMAL HIGH (ref 70–99)
Glucose-Capillary: 208 mg/dL — ABNORMAL HIGH (ref 70–99)

## 2020-12-22 MED ORDER — APIXABAN (ELIQUIS) EDUCATION KIT FOR DVT/PE PATIENTS
PACK | Freq: Once | Status: AC
  Filled 2020-12-22: qty 1

## 2020-12-22 MED ORDER — APIXABAN 5 MG PO TABS: 5.0000 mg | ORAL_TABLET | Freq: Two times a day (BID) | ORAL | Status: DC

## 2020-12-22 MED ORDER — APIXABAN 5 MG PO TABS
10.0000 mg | ORAL_TABLET | Freq: Two times a day (BID) | ORAL | Status: DC
  Administered 2020-12-22 – 2020-12-25 (×7): 10 mg
  Filled 2020-12-22 (×7): qty 2

## 2020-12-22 MED ORDER — FOOD THICKENER (SIMPLYTHICK HONEY)
10.0000 | ORAL | Status: DC | PRN
  Filled 2020-12-22: qty 10

## 2020-12-22 NOTE — Progress Notes (Signed)
ANTICOAGULATION CONSULT NOTE - Initial Consult  Pharmacy Consult for Apixaban Indication: atrial fibrillation  No Known Allergies  Patient Measurements: Height: 5' 9.02" (175.3 cm) Weight: 94.3 kg (207 lb 14.3 oz) IBW/kg (Calculated) : 70.74   Vital Signs: Temp: 99.8 F (37.7 C) (06/28 1638) Temp Source: Oral (06/28 1638) BP: 129/75 (06/28 1638) Pulse Rate: 94 (06/28 1638)  Labs: Recent Labs    12/20/20 0249 12/21/20 0350 12/22/20 0416  HGB 9.3* 9.5*  --   HCT 31.2* 32.5*  --   PLT 428* 382  --   HEPARINUNFRC <0.10*  --   --   CREATININE 0.84 0.68 0.64    Estimated Creatinine Clearance: 101.5 mL/min (by C-G formula based on SCr of 0.64 mg/dL).   Medical History: Past Medical History:  Diagnosis Date   Asthma-COPD overlap syndrome (HCC)    CAD (coronary artery disease)    HLD (hyperlipidemia)    HTN (hypertension)    Lumbar disc herniation with radiculopathy    Lumbar stenosis    Osteoporosis     Assessment: 70 yo male with lower extremity doppler findings of age indeterminate R DVT. 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 Pharmacy consulted for apixaban for DVT. Neurology plans to stop clopidogrel.   Goal of Therapy:  Monitor platelets by anticoagulation protocol: Yes   Plan:  Start apixaban 10mg  twice daily x 7 days followed by 5mg  twice daily  Pharmacy to provide education  Consult to case management to assess cost placed    12/22/2020,5:51 PM

## 2020-12-22 NOTE — Discharge Instructions (Addendum)
Information on my medicine - ELIQUIS (apixaban)  Why was Eliquis prescribed for you? Eliquis was prescribed to treat blood clots that may have been found in the veins of your legs (deep vein thrombosis) or in your lungs (pulmonary embolism) and to reduce the risk of them occurring again.  What do You need to know about Eliquis ?  Your current dose is ONE 5 mg tablet taken TWICE daily.  Eliquis may be taken with or without food.   Try to take the dose about the same time in the morning and in the evening. If you have difficulty swallowing the tablet whole please discuss with your pharmacist how to take the medication safely.  Take Eliquis exactly as prescribed and DO NOT stop taking Eliquis without talking to the doctor who prescribed the medication.  Stopping may increase your risk of developing a new blood clot.  Refill your prescription before you run out.  After discharge, you should have regular check-up appointments with your healthcare provider that is prescribing your Eliquis.    What do you do if you miss a dose? If a dose of ELIQUIS is not taken at the scheduled time, take it as soon as possible on the same day and twice-daily administration should be resumed. The dose should not be doubled to make up for a missed dose.  Important Safety Information A possible side effect of Eliquis is bleeding. You should call your healthcare provider right away if you experience any of the following: ? Bleeding from an injury or your nose that does not stop. ? Unusual colored urine (red or dark brown) or unusual colored stools (red or black). ? Unusual bruising for unknown reasons. ? A serious fall or if you hit your head (even if there is no bleeding).  Some medicines may interact with Eliquis and might increase your risk of bleeding or clotting while on Eliquis. To help avoid this, consult your healthcare provider or pharmacist prior to using any new prescription or non-prescription  medications, including herbals, vitamins, non-steroidal anti-inflammatory drugs (NSAIDs) and supplements.  This website has more information on Eliquis (apixaban): http://www.eliquis.com/eliquis/home  

## 2020-12-22 NOTE — Progress Notes (Signed)
  Speech Language Pathology Treatment: Dysphagia;Cognitive-Linquistic  Patient Details Name: Brandon Daniel MRN: [id number] DOB: 1950-09-30 Today's Date: 12/22/2020 Time: 0920-0950 SLP Time Calculation (min) (ACUTE ONLY): 30 min  Assessment / Plan / Recommendation Clinical Impression  Pt making notable gains with swallowing function and cognitive linguistics. Pt very alert. Performed diligent oral care (>10 minutes; pt still exhibiting xerostomia with dried secretions in setting of prolonged NPO status however excellent response to thorough oral care by SLP). Pt able to nod head yes to simple questions (wanting ice chips, trials of puree, and honey thick liquids). Pt able to follow simple commands with improved accuracy (2/3 this date). Communication provided at bedside. Pt able to visually scan however not utilizing appropriately yet. Pt with improved mastication and oral acceptance of PO trials. No anterior spillage appreciated this date and pt with notable oral bolus loss during prior ST encounters. Pt with improved AP transit of bolus (still delayed some and mild oral residuals). Pt with palpable swallows (though suspected to be delayed). Single ice chips, puree tsp, and honey thick by teaspoon were without overt coughing. 1/2 tsp ice chips did yield overt coughing. Pt is ready for instrumental assessment this date as long as mentation remains appropriate. Plan for MBSS 1pm this date. Pt okay for single ice chips with staff following oral care as tolerated.    HPI HPI: Brandon Daniel is a 70 y.o. male with a past medical history significant for, HLD, HTN and lumbar stenosis who presented to [facility] with SOB and diagnoses with influenza A. On the day of admission he developed mild R facial droop, R arm drift and hand grip weakness along with R leg weakness. Code stroke was initiated and he received TPA.      SLP Plan  Continue with current plan of care;MBS;New goals to be determined pending  instrumental study       Recommendations  Diet recommendations: NPO;Other(comment) (PO ice chips with nursing and SLP) Medication Administration: Via alternative means                Oral Care Recommendations: Oral care BID;Oral care prior to ice chip/H20 Follow up Recommendations: Skilled Nursing facility;24 hour supervision/assistance SLP Visit Diagnosis: Dysphagia, oropharyngeal phase (R13.12) Plan: Continue with current plan of care;MBS;New goals to be determined pending instrumental study       GO                Ardyth Gal MA, CCC-SLP Acute Rehabilitation Services   12/22/2020, 9:59 AM

## 2020-12-22 NOTE — Progress Notes (Signed)
OT Cancellation Note  Patient Details Name: Brandon Daniel MRN: [id number] DOB: [birth year]   Cancelled Treatment:    Reason Eval/Treat Not Completed: Other (comment) Nursing requesting hold until after MBS this date as pt easily fatigued. OT will continue with attempts schedule permitting  Emaline Karnes OTR/L acute rehab services Office: [phone]  12/22/2020, 11:38 AM

## 2020-12-22 NOTE — TOC Progression Note (Signed)
Transition of Care [facility]) - Progression Note    Patient Details  Name: Brandon Daniel MRN: [id number] Date of Birth: 02/08/1951  Transition of [facility] CM/SW Contact  Kermit Balo, RN Phone Number: 12/22/2020, 12:19 PM  Clinical Narrative:    Pt did not pass swallowing test again today. CM and MD talked and want to see if pt qualifies for [facility]. CM has spoken to pts nephew, Brandon Daniel and he is in agreement and prefers [facility].  Cm has reached out to [facility] and they will review his chart to see if he qualifies.  TOC following.   Expected Discharge Plan: Long Term Acute Care (LTAC) Barriers to Discharge: Continued Medical Work up  Expected Discharge Plan and Services Expected Discharge Plan: Long Term Acute Care (LTAC) In-house Referral: Clinical Social Work   Post Acute Care Choice: Long Term Acute Care (LTAC) Living arrangements for the past 2 months: Single Family Home                                       Social Determinants of Health (SDOH) Interventions    Readmission Risk Interventions No flowsheet data found.

## 2020-12-22 NOTE — Progress Notes (Signed)
Lower extremity venous has been completed.   Preliminary results in CV Proc.   Blanch Media 12/22/2020 1:59 PM

## 2020-12-22 NOTE — Progress Notes (Signed)
Modified Barium Swallow Progress Note  Patient Details  Name: Brandon Daniel MRN: [id number] Date of Birth: [birth year]  Today's Date: 12/22/2020  Modified Barium Swallow completed.  Full report located under Chart Review in the Imaging Section.  Brief recommendations include the following:  Clinical Impression  Pt presents with a moderate oropharyngeal dysphagia of neurogenic etiology and still exhibits some oral apraxia though greatly improving since admission. Orally pt with reduced labial seal with intermittent right anterior spillage, oral residuals (right greater than left), and reduced bolus cohesion. Pt was unable to propel initial trial of nectar thick by tsp to pharynx, necessitating larger volume for adequate oral transit. Cup sip of nectar thick liquid resulted in premature spillage with mistimed epiglottic inversion, poor airway protection and sensed but not cleared laryngeal penetration and tracheal aspiration. Pt with oral residuals across all POs. Honey thick liquids resulted in transient laryngeal penetration during the swallow, clearing reflexively. Intact airway protection noted with puree. Moderate oral residuals however appreciated. Trace vallecular and pyriform sinus residuals noted with POs. Recommend dysphagia 1 (puree) and honey thick liquids withs meds crushed in puree. Full supervision and full feeding asssitance. Pt requires oral care prior to and following PO consumption as likelihood for aspiration from residuals and pt saliva is increased. SLP to closely follow.   Swallow Evaluation Recommendations       SLP Diet Recommendations: Honey thick liquids;Dysphagia 1 (Puree) solids   Liquid Administration via: Cup   Medication Administration: Crushed with puree   Supervision: Full assist for feeding;Full supervision/cueing for compensatory strategies   Compensations: Slow rate;Small sips/bites;Multiple dry swallows after each bite/sip;Follow solids with liquid    Postural Changes: Remain semi-upright after after feeds/meals (Comment);Seated upright at 90 degrees   Oral Care Recommendations: Oral care before and after PO;Staff/trained caregiver to provide oral care;Oral care BID   Other Recommendations: Order thickener from pharmacy   Ardyth Gal MA, CCC-SLP Acute Rehabilitation Services   12/22/2020,2:15 PM

## 2020-12-22 NOTE — Progress Notes (Signed)
PROGRESS NOTE    Brandon Daniel  ZOX:[id number] DOB: May 14, 1951 DOA: 12/03/2020 PCP: Pcp, No    Brief Narrative:  70 year old male with a history of coronary artery disease, hypertension, hyperlipidemia, admitted to the hospital with shortness of breath.  He was found to be influenza positive, as well as having an elevated troponin.  He was admitted for further observation.  The day after admission, he was noted to have acute change in neuro status with acute right-sided weakness.  He was found to have left MCA/ACA territory infarct.  Seen by neurology and received tPA.  On 6/10, he had worsening respiratory status requiring intubation.  He was noted to have bilateral lower lobe pneumonia. He was treated with antibiotics and lasix. Subsequently extubated on 6/17. [facility] He had recurrence of fever and was found to have Klebsiella ESBL UTI and staph pneumonia. He is currently on Meropenem. He has significant dysphagia and currently receiving nutrition through cortrak. He was also noted to have RLE DVT and started on DOAC. Disposition would be to CIR vs. SNF vs. LTACH   Assessment & Plan:   Principal Problem:   Influenza A Active Problems:   NSTEMI (non-ST elevated myocardial infarction) (HCC)   CAD (coronary artery disease)   Asthma   Essential hypertension   Hyperlipidemia   Dyspnea   Pleural effusion on left   Stroke (cerebrum) (HCC)   Acute respiratory failure with hypoxia (HCC)   Left MCA/ACA territory stroke -he has dysphagia, aphasia and right hemiplegia -Received tPA on 6/9 secondary to symptomatic high-grade proximal left carotid stenosis -Neurology evaluated patient  -Currently on antiplatelet therapy with aspirin -he is also on DOAC for 3 months for DVT -once DOAC course is complete, he will need to be on DAPT with asa and plavix -LDL 55, A1c 6.2 -etiology of stroke likely large vessel atherosclerosis  Left internal carotid artery stenosis -Seen by  neuro interventional radiology who recommended outpatient follow-up for ICA stenting if patient makes meaningful neurologic improvement  Non-STEMI -Patient noted to have new ST depressions in V6 and worsening T wave inversions in inferior leads -Seen by cardiology and underwent left heart cath on 6/14 that showed multivessel disease -Recommendations were for medical management for now -He will need to follow-up with cardiology to be considered for further therapy, provided he makes further neurologic recovery  Acute respiratory failure with hypoxia -Secondary to pneumonia/left pleural effusion/increased respiratory secretions -He was previously treated with antivirals for influenza as well as completed a course of antibiotics -Repeat findings indicate possible new staph pneumonia -Continue chest physical therapy -oxygen requirements are currently at 2L and continues to have increased secretions requiring suctioning  -he is at high risk for recurrent aspiration  ESBL Klebsiella UTI -Currently on meropenem -We will continue with current treatments, overall fevers appear to be improving  MSSA pneumonia -discontinue vancomycin since he is already on meropenem -continue pulmonary hygiene, still requiring suctioning by RT -clinically improving.   RLE peroneal DVT -venous dopplers show DVT in RLE, below the knee that is age indeterminate -patient does not have any known prior history of DVT  -Discussed with Dr. Vassie LollAlva and it was felt reasonable to hold anticoagulation, monitor for now and repeat venous dopplers in 3-5 days -venous dopplers repeated on 6/28 that showed persistent DVT -since patient is immobile, he would be considered higher risk extension of DVT -will start on DOAC for 3 months -discussed with neurology Dr. Roda ShuttersXu, and it was recommended to discontinue plavix while  on anticoagulation. He should continue on aspirin -once his anticoagulation course is complete, he should return to  DAPT   AKI -BUN/creatinine had been trending up -suspect that he may have increased fluid loss -does not appear to be volume overload -improved with hydration  Type 2 diabetes -A1c of 6.2 -Continue sliding scale insulin and Lantus -blood sugars stable  Dysphagia -Related to stroke -Currently has cortrak for tube feeding -may need to consider PEG tube if patient will be going to SNF, since SNF would not accept him with cortrak -patient had MBSS today and was started on puree diet with honey thick liquids. He is taking a few bites at this time. -will continue tube feeding for now, until his po intake has improved   Urinary retention -Has failed voiding trial twice, but per staff was not on any medications -Has been started on Cardura on 6/21 (cannot take tamsulosin through cortrak) -Reattempted voiding trial on 6/25 and patient appears to be passing urine -Currently has condom catheter in place  Hypertension -stable on metoprolol  Goals of care -seen by palliative care. Family has elected to continue with full scope of treatment   DVT prophylaxis: eliquis  Code Status: full code Family Communication: discussed with family at the bedside Disposition Plan: Status is: Inpatient  Remains inpatient appropriate because:Ongoing diagnostic testing needed not appropriate for outpatient work up and Inpatient level of care appropriate due to severity of illness  Dispo: The patient is from: Home              Anticipated d/c is to:  CIR  vs. SNF vs. LTACH              Patient currently is not medically stable to d/c.   Difficult to place patient No    Consultants:  Neurology (signed off) PCCM (signed off) Cardiology (signed off)  Procedures:  ETT 6/10>6/17 LHC 6/14  Antimicrobials:  Tamiflu completed 6/13 Azithromycin 6/11 > 6/14 Ceftriaxone 6/11 > 6/15  Vancomycin 6/24> 6/26 Cefepime 6/24 > 6/26 Meropenem 6/26>   Subjective: Denies any pain or shortness of  breath. Staff reports increased loose stools today. He had MBSS today and started on puree diet  Objective: Vitals:   12/22/20 1144 12/22/20 1200 12/22/20 1627 12/22/20 1638  BP:    129/75  Pulse:  93  94  Resp:  (!) 28  18  Temp:    99.8 F (37.7 C)  TempSrc:    Oral  SpO2: 99% 97% 99% 97%  Weight:      Height:        Intake/Output Summary (Last 24 hours) at 12/22/2020 1737 Last data filed at 12/22/2020 0800 Gross per 24 hour  Intake --  Output 650 ml  Net -650 ml   Filed Weights   12/17/20 0500 12/18/20 0500 12/19/20 0500  Weight: 94.2 kg 94.1 kg 94.3 kg    Examination:  General exam: awake, no distress Respiratory system: rhonchi are improving. Respiratory effort normal. Cardiovascular system:RRR. No murmurs, rubs, gallops. Gastrointestinal system: Abdomen is nondistended, soft and nontender. No organomegaly or masses felt. Normal bowel sounds heard. Central nervous system: dense right hemiplegia. Aphasic Extremities: No C/C/E, +pedal pulses Skin: No rashes, lesions or ulcers Psychiatry: unable to assess    Data Reviewed: I have personally reviewed following labs and imaging studies  CBC: Recent Labs  Lab 12/17/20 0341 12/17/20 1833 12/18/20 0216 12/19/20 0416 12/20/20 0249 12/21/20 0350  WBC 7.8  --  11.9* 8.4 6.9 4.9  HGB  11.0* 11.9* 11.9* 9.5* 9.3* 9.5*  HCT 37.3* 35.0* 40.4 33.7* 31.2* 32.5*  MCV 93.5  --  94.2 95.7 94.3 93.9  PLT 489*  --  446* 440* 428* 382   Basic Metabolic Panel: Recent Labs  Lab 12/17/20 0341 12/17/20 1833 12/18/20 0216 12/19/20 0416 12/20/20 0249 12/21/20 0350 12/22/20 0416  NA 144   < > 145 145 148* 144 142  K 4.6   < > 4.5 3.7 4.5 3.8 4.0  CL 107  --  103 106 113* 110 106  CO2 30  --  33* GLUCOSE 193*  --  163* 220* 198* 199* 144*  BUN 79*  --  61* 49* 46* 33* 30*  CREATININE 1.26*  --  1.13 0.89 0.84 0.68 0.64  CALCIUM 8.8*  --  8.8* 8.4* 8.2* 8.1* 8.5*  PHOS 5.9*  --   --  3.6  --   --   --    <  > = values in this interval not displayed.   GFR: Estimated Creatinine Clearance: 101.5 mL/min (by C-G formula based on SCr of 0.64 mg/dL). Liver Function Tests: Recent Labs  Lab 12/17/20 0341 12/19/20 0416 12/21/20 0350 12/22/20 0416  AST  --   --  93* 101*  ALT  --   --  100* 123*  ALKPHOS  --   --  71 68  BILITOT  --   --  0.5 0.3  PROT  --   --  5.9* 6.3*  ALBUMIN 2.2* 1.9* 1.7* 1.9*   No results for input(s): LIPASE, AMYLASE in the last 168 hours. No results for input(s): AMMONIA in the last 168 hours. Coagulation Profile: No results for input(s): INR, PROTIME in the last 168 hours. Cardiac Enzymes: No results for input(s): CKTOTAL, CKMB, CKMBINDEX, TROPONINI in the last 168 hours. BNP (last 3 results) No results for input(s): PROBNP in the last 8760 hours. HbA1C: No results for input(s): HGBA1C in the last 72 hours. CBG: Recent Labs  Lab 12/22/20 0015 12/22/20 0405 12/22/20 0739 12/22/20 1228 12/22/20 1655  GLUCAP 208* 147* 166* 166* 186*   Lipid Profile: No results for input(s): CHOL, HDL, LDLCALC, TRIG, CHOLHDL, LDLDIRECT in the last 72 hours. Thyroid Function Tests: No results for input(s): TSH, T4TOTAL, FREET4, T3FREE, THYROIDAB in the last 72 hours. Anemia Panel: No results for input(s): VITAMINB12, FOLATE, FERRITIN, TIBC, IRON, RETICCTPCT in the last 72 hours. Sepsis Labs: No results for input(s): PROCALCITON, LATICACIDVEN in the last 168 hours.  Recent Results (from the past 240 hour(s))  Culture, Respiratory w Gram Stain     Status: None   Collection Time: 12/17/20  5:58 PM   Specimen: Tracheal Aspirate; Respiratory  Result Value Ref Range Status   Specimen Description TRACHEAL ASPIRATE  Final   Special Requests NONE  Final   Gram Stain   Final    ABUNDANT WBC PRESENT,BOTH PMN AND MONONUCLEAR ABUNDANT GRAM POSITIVE COCCI IN PAIRS RARE GRAM POSITIVE RODS Performed at [facility], [address] [place], [place] [address]    Culture  ABUNDANT STAPHYLOCOCCUS AUREUS  Final   Report Status 12/20/2020 FINAL  Final   Organism ID, Bacteria STAPHYLOCOCCUS AUREUS  Final      Susceptibility   Staphylococcus aureus - MIC*    CIPROFLOXACIN <=0.5 SENSITIVE Sensitive     ERYTHROMYCIN >=8 RESISTANT Resistant     GENTAMICIN <=0.5 SENSITIVE Sensitive     OXACILLIN 0.5 SENSITIVE Sensitive     TETRACYCLINE <=1  SENSITIVE Sensitive     VANCOMYCIN <=0.5 SENSITIVE Sensitive     TRIMETH/SULFA <=10 SENSITIVE Sensitive     CLINDAMYCIN RESISTANT Resistant     RIFAMPIN <=0.5 SENSITIVE Sensitive     Inducible Clindamycin POSITIVE Resistant     * ABUNDANT STAPHYLOCOCCUS AUREUS  Culture, Urine     Status: Abnormal   Collection Time: 12/18/20  8:29 AM   Specimen: Urine, Random  Result Value Ref Range Status   Specimen Description URINE, RANDOM  Final   Special Requests   Final    NONE Performed at [facility], [address] [place], [place] [address]    Culture (A)  Final    >=100,000 COLONIES/mL KLEBSIELLA PNEUMONIAE Confirmed Extended Spectrum Beta-Lactamase Producer (ESBL).  In bloodstream infections from ESBL organisms, carbapenems are preferred over piperacillin/tazobactam. They are shown to have a lower risk of mortality.    Report Status 12/20/2020 FINAL  Final   Organism ID, Bacteria KLEBSIELLA PNEUMONIAE (A)  Final      Susceptibility   Klebsiella pneumoniae - MIC*    AMPICILLIN >=32 RESISTANT Resistant     CEFAZOLIN >=64 RESISTANT Resistant     CEFEPIME 2 SENSITIVE Sensitive     CEFTRIAXONE >=64 RESISTANT Resistant     CIPROFLOXACIN 0.5 SENSITIVE Sensitive     GENTAMICIN <=1 SENSITIVE Sensitive     IMIPENEM <=0.25 SENSITIVE Sensitive     NITROFURANTOIN 64 INTERMEDIATE Intermediate     TRIMETH/SULFA >=320 RESISTANT Resistant     AMPICILLIN/SULBACTAM 8 SENSITIVE Sensitive     PIP/TAZO <=4 SENSITIVE Sensitive     * >=100,000 COLONIES/mL KLEBSIELLA PNEUMONIAE  MRSA Next Gen by PCR, Nasal     Status: None    Collection Time: 12/18/20 10:38 AM   Specimen: Nasal Mucosa; Nasal Swab  Result Value Ref Range Status   MRSA by PCR Next Gen NOT DETECTED NOT DETECTED Final    Comment: (NOTE) The GeneXpert MRSA Assay (FDA approved for NASAL specimens only), is one component of a comprehensive MRSA colonization surveillance program. It is not intended to diagnose MRSA infection nor to guide or monitor treatment for MRSA infections. Test performance is not FDA approved in patients less than 7 years old. Performed at [facility], [address] [place], [place] [address]   Culture, blood (Routine X 2) w Reflex to ID Panel     Status: None (Preliminary result)   Collection Time: 12/18/20 11:27 AM   Specimen: BLOOD RIGHT ARM  Result Value Ref Range Status   Specimen Description BLOOD RIGHT ARM  Final   Special Requests   Final    BOTTLES DRAWN AEROBIC ONLY Blood Culture adequate volume   Culture   Final    NO GROWTH 4 DAYS Performed at [facility], [address], [place], [place] [address]    Report Status PENDING  Incomplete  Culture, blood (Routine X 2) w Reflex to ID Panel     Status: None (Preliminary result)   Collection Time: 12/18/20 11:30 AM   Specimen: BLOOD RIGHT HAND  Result Value Ref Range Status   Specimen Description BLOOD RIGHT HAND  Final   Special Requests   Final    BOTTLES DRAWN AEROBIC ONLY Blood Culture results may not be optimal due to an inadequate volume of blood received in culture bottles   Culture   Final    NO GROWTH 4 DAYS Performed at [facility], [address], [place], [place] [address]  Report Status PENDING  Incomplete         Radiology Studies: DG Swallowing Func-Speech Pathology  Result Date: 12/22/2020 Formatting of this result is different from the original. Objective Swallowing Evaluation: Type of Study: MBS-Modified Barium Swallow Study  Patient Details Name: Brandon Daniel MRN: [id number] Date of Birth: [birth year] Today's  Date: 12/22/2020 Time: SLP Start Time (ACUTE ONLY): 1310 -SLP Stop Time (ACUTE ONLY): 1335 SLP Time Calculation (min) (ACUTE ONLY): 25 min Past Medical History: Past Medical History: Diagnosis Date  Asthma-COPD overlap syndrome (HCC)   CAD (coronary artery disease)   HLD (hyperlipidemia)   HTN (hypertension)   Lumbar disc herniation with radiculopathy   Lumbar stenosis   Osteoporosis  Past Surgical History: Past Surgical History: Procedure Laterality Date  CATARACT EXTRACTION, BILATERAL  2021  CORONARY ARTERY BYPASS GRAFT  2005  INGUINAL HERNIA REPAIR  1998  LEFT HEART CATH AND CORS/GRAFTS ANGIOGRAPHY N/A 12/08/2020  Procedure: LEFT HEART CATH AND CORS/GRAFTS ANGIOGRAPHY;  Surgeon: Orpah Cobb, MD;  Location: [facility] INVASIVE CV LAB;  Service: Cardiovascular;  Laterality: N/A;  LUMBAR DISC SURGERY  2019 HPI: Mr. Kainon Varady is a 70 y.o. male admitted 6/9 that  presented with SOB, was found to be Influenza A + and later on day of admission developed mild R facial droop, R arm drift and hand grip weakness along with R leg weakness. Left MCA and ACA CVA. Pt received tPa. VDRF 6/11-6/15. PMH: CAD, HTN, lumbar stenosis, HLD  Subjective: upright in chair for procedure Assessment / Plan / Recommendation CHL IP CLINICAL IMPRESSIONS 12/22/2020 Clinical Impression Pt presents with a moderate oropharyngeal dysphagia of neurogenic etiology and still exhibits some oral apraxia though greatly improving since admission. Orally pt with reduced labial seal with intermittent right anterior spillage, oral residuals (right greater than left), and reduced bolus cohesion. Pt was unable to propel initial trial of nectar thick by tsp to pharynx, necessitating larger volume for adequate oral transit. Cup sip of nectar thick liquid resulted in premature spillage with mistimed epiglottic inversion, poor airway protection and sensed but not cleared laryngeal penetration and tracheal aspiration. Pt with oral residuals across all POs. Honey thick  liquids resulted in transient laryngeal penetration during the swallow, clearing reflexively. Intact airway protection noted with puree. Moderate oral residuals however appreciated. Trace vallecular and pyriform sinu residuals noted with POs. Recommend dysphagia 1 (puree) and honey thick liquids withs meds crushed in puree. Full supervision and full feeding asssitance. Pt requires oral care prior to and following PO consumption as likelihood for aspiration from residuals and pt saliva is increased. SLP to closely follow. SLP Visit Diagnosis Dysphagia, oropharyngeal phase (R13.12) Attention and concentration deficit following -- Frontal lobe and executive function deficit following -- Impact on safety and function Moderate aspiration risk   CHL IP TREATMENT RECOMMENDATION 12/22/2020 Treatment Recommendations Therapy as outlined in treatment plan below   Prognosis 12/22/2020 Prognosis for Safe Diet Advancement Good Barriers to Reach Goals Severity of deficits;Time post onset Barriers/Prognosis Comment -- CHL IP DIET RECOMMENDATION 12/22/2020 SLP Diet Recommendations Honey thick liquids;Dysphagia 1 (Puree) solids Liquid Administration via Cup Medication Administration Crushed with puree Compensations Slow rate;Small sips/bites;Multiple dry swallows after each bite/sip;Follow solids with liquid Postural Changes Remain semi-upright after after feeds/meals (Comment);Seated upright at 90 degrees   CHL IP OTHER RECOMMENDATIONS 12/22/2020 Recommended Consults -- Oral Care Recommendations Oral care before and after PO;Staff/trained caregiver to provide oral care;Oral care BID Other Recommendations Order thickener from pharmacy   CHL IP FOLLOW UP RECOMMENDATIONS 12/22/2020 Follow  up Recommendations Skilled Nursing facility;24 hour supervision/assistance   CHL IP FREQUENCY AND DURATION 12/22/2020 Speech Therapy Frequency (ACUTE ONLY) min 2x/week Treatment Duration 2 weeks      CHL IP ORAL PHASE 12/22/2020 Oral Phase Impaired Oral -  Pudding Teaspoon -- Oral - Pudding Cup -- Oral - Honey Teaspoon -- Oral - Honey Cup Incomplete tongue to palate contact;Reduced posterior propulsion;Holding of bolus;Pocketing in anterior sulcus;Lingual/palatal residue;Delayed oral transit;Decreased bolus cohesion;Premature spillage;Right anterior bolus loss;Weak lingual manipulation Oral - Nectar Teaspoon Lingual/palatal residue;Decreased bolus cohesion;Holding of bolus;Reduced posterior propulsion;Weak lingual manipulation Oral - Nectar Cup Delayed oral transit;Decreased bolus cohesion;Lingual/palatal residue;Reduced posterior propulsion;Incomplete tongue to palate contact;Weak lingual manipulation;Right anterior bolus loss Oral - Nectar Straw -- Oral - Thin Teaspoon -- Oral - Thin Cup -- Oral - Thin Straw -- Oral - Puree Delayed oral transit;Lingual/palatal residue;Piecemeal swallowing;Reduced posterior propulsion;Incomplete tongue to palate contact;Weak lingual manipulation Oral - Mech Soft -- Oral - Regular -- Oral - Multi-Consistency -- Oral - Pill -- Oral Phase - Comment --  CHL IP PHARYNGEAL PHASE 12/22/2020 Pharyngeal Phase Impaired Pharyngeal- Pudding Teaspoon -- Pharyngeal -- Pharyngeal- Pudding Cup -- Pharyngeal -- Pharyngeal- Honey Teaspoon -- Pharyngeal -- Pharyngeal- Honey Cup Delayed swallow initiation-pyriform sinuses;Reduced epiglottic inversion;Penetration/Aspiration during swallow;Pharyngeal residue - valleculae;Pharyngeal residue - pyriform;Reduced tongue base retraction;Reduced pharyngeal peristalsis Pharyngeal Material enters airway, remains ABOVE vocal cords then ejected out Pharyngeal- Nectar Teaspoon Other (Comment) Pharyngeal -- Pharyngeal- Nectar Cup Delayed swallow initiation-pyriform sinuses;Reduced epiglottic inversion;Reduced anterior laryngeal mobility;Reduced airway/laryngeal closure;Penetration/Aspiration before swallow;Penetration/Aspiration during swallow;Moderate aspiration;Pharyngeal residue - valleculae;Pharyngeal residue -  pyriform Pharyngeal Material enters airway, passes BELOW cords and not ejected out despite cough attempt by patient Pharyngeal- Nectar Straw -- Pharyngeal -- Pharyngeal- Thin Teaspoon -- Pharyngeal -- Pharyngeal- Thin Cup -- Pharyngeal -- Pharyngeal- Thin Straw -- Pharyngeal -- Pharyngeal- Puree Delayed swallow initiation-vallecula;Reduced pharyngeal peristalsis;Reduced tongue base retraction;Pharyngeal residue - valleculae Pharyngeal -- Pharyngeal- Mechanical Soft -- Pharyngeal -- Pharyngeal- Regular -- Pharyngeal -- Pharyngeal- Multi-consistency -- Pharyngeal -- Pharyngeal- Pill -- Pharyngeal -- Pharyngeal Comment --  CHL IP CERVICAL ESOPHAGEAL PHASE 12/22/2020 Cervical Esophageal Phase WFL Pudding Teaspoon -- Pudding Cup -- Honey Teaspoon -- Honey Cup -- Nectar Teaspoon -- Nectar Cup -- Nectar Straw -- Thin Teaspoon -- Thin Cup -- Thin Straw -- Puree -- Mechanical Soft -- Regular -- Multi-consistency -- Pill -- Cervical Esophageal Comment -- Chelsea E Hartness MA, CCC-SLP 12/22/2020, 2:14 PM              VAS [place] LOWER EXTREMITY VENOUS (DVT)  Result Date: 12/22/2020  Lower Venous DVT Study Patient Name:  Brandon Daniel  Date of Exam:   12/22/2020 Medical Rec #: [id number]      Accession #:    [id number] Date of Birth: [birth year]     Patient Gender: M Patient Age:   69Y Exam Location:  [facility] Procedure:      VAS [place] LOWER EXTREMITY VENOUS (DVT) Referring Phys: [id number] Eileen Kangas --------------------------------------------------------------------------------  Indications: Follow up peroneal dvt.  Comparison Study: 12/18/20 prior Performing Technologist: [place] Ponder RVS  Examination Guidelines: A complete evaluation includes B-mode imaging, spectral Doppler, color Doppler, and power Doppler as needed of all accessible portions of each vessel. Bilateral testing is considered an integral part of a complete examination. Limited examinations for reoccurring indications may be performed as noted. The  reflux portion of the exam is performed with the patient in reverse Trendelenburg.  +---------+---------------+---------+-----------+----------+-------------------+ RIGHT    CompressibilityPhasicitySpontaneityPropertiesThrombus Aging      +---------+---------------+---------+-----------+----------+-------------------+ CFV  Full           Yes      Yes                                      +---------+---------------+---------+-----------+----------+-------------------+ SFJ      Full                                                             +---------+---------------+---------+-----------+----------+-------------------+ FV Prox  Full                                                             +---------+---------------+---------+-----------+----------+-------------------+ FV Mid   Full                                                             +---------+---------------+---------+-----------+----------+-------------------+ FV DistalFull                                                             +---------+---------------+---------+-----------+----------+-------------------+ PFV      Full                                                             +---------+---------------+---------+-----------+----------+-------------------+ POP      Full           Yes      Yes                                      +---------+---------------+---------+-----------+----------+-------------------+ PTV      Full                                                             +---------+---------------+---------+-----------+----------+-------------------+ PERO     Partial                                      Not well visualized +---------+---------------+---------+-----------+----------+-------------------+   +----+---------------+---------+-----------+----------+--------------+ LEFTCompressibilityPhasicitySpontaneityPropertiesThrombus Aging  +----+---------------+---------+-----------+----------+--------------+ CFV Full           Yes      Yes                                 +----+---------------+---------+-----------+----------+--------------+  Summary: RIGHT: - 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 - No cystic structure found in the popliteal fossa.  LEFT: - No evidence of common femoral vein obstruction.  *See table(s) above for measurements and observations. Electronically signed by Waverly Ferrari MD on 12/22/2020 at 2:54:55 PM.    Final         Scheduled Meds:  arformoterol  15 mcg Nebulization BID   aspirin  81 mg Per Tube Daily   atorvastatin  80 mg Per Tube Daily   budesonide (PULMICORT) nebulizer solution  0.5 mg Nebulization BID   chlorhexidine  15 mL Mouth Rinse BID   doxazosin  2 mg Per Tube Daily   ezetimibe  10 mg Per Tube Daily   feeding supplement (PROSource TF)  45 mL Per Tube TID   free water  200 mL Per Tube Q4H   gabapentin  300 mg Per Tube BID   insulin aspart  0-15 Units Subcutaneous Q4H   insulin aspart  8 Units Subcutaneous Q4H   insulin glargine  15 Units Subcutaneous BID   ipratropium-albuterol  3 mL Nebulization QID   mouth rinse  15 mL Mouth Rinse q12n4p   metoprolol tartrate  25 mg Per Tube BID   montelukast  10 mg Per Tube QHS   pantoprazole sodium  40 mg Per Tube QHS   sodium chloride flush  3 mL Intravenous Q12H   Continuous Infusions:  sodium chloride     feeding supplement (VITAL 1.5 CAL) 1,000 mL (12/22/20 0322)   meropenem (MERREM) IV 1 g (12/22/20 1426)     LOS: 19 days    Time spent:    Erick Blinks, MD [facility]   If 7PM-7AM, please contact night-coverage [email or web address]  12/22/2020, 5:37 PM

## 2020-12-23 ENCOUNTER — Other Ambulatory Visit (HOSPITAL_COMMUNITY): Payer: Self-pay

## 2020-12-23 DIAGNOSIS — J690 Pneumonitis due to inhalation of food and vomit: Secondary | ICD-10-CM | POA: Diagnosis not present

## 2020-12-23 DIAGNOSIS — I63232 Cerebral infarction due to unspecified occlusion or stenosis of left carotid arteries: Secondary | ICD-10-CM | POA: Diagnosis not present

## 2020-12-23 LAB — COMPREHENSIVE METABOLIC PANEL
ALT: 130 U/L — ABNORMAL HIGH (ref 0–44)
AST: 99 U/L — ABNORMAL HIGH (ref 15–41)
Albumin: 2 g/dL — ABNORMAL LOW (ref 3.5–5.0)
Alkaline Phosphatase: 69 U/L (ref 38–126)
Anion gap: 7 (ref 5–15)
BUN: 28 mg/dL — ABNORMAL HIGH (ref 8–23)
CO2: 28 mmol/L (ref 22–32)
Calcium: 8.4 mg/dL — ABNORMAL LOW (ref 8.9–10.3)
Chloride: 106 mmol/L (ref 98–111)
Creatinine, Ser: 0.59 mg/dL — ABNORMAL LOW (ref 0.61–1.24)
GFR, Estimated: >60 mL/min (ref >60)
Glucose, Bld: 220 mg/dL — ABNORMAL HIGH (ref 70–99)
Potassium: 4.2 mmol/L (ref 3.5–5.1)
Sodium: 141 mmol/L (ref 135–145)
Total Bilirubin: 0.3 mg/dL (ref 0.3–1.2)
Total Protein: 6.1 g/dL — ABNORMAL LOW (ref 6.5–8.1)

## 2020-12-23 LAB — TSH: TSH: 0.833 u[IU]/mL (ref 0.350–4.500)

## 2020-12-23 LAB — CULTURE, BLOOD (ROUTINE X 2)
Culture: NO GROWTH
Culture: NO GROWTH
Special Requests: ADEQUATE

## 2020-12-23 LAB — GLUCOSE, CAPILLARY
Glucose-Capillary: 109 mg/dL — ABNORMAL HIGH (ref 70–99)
Glucose-Capillary: 152 mg/dL — ABNORMAL HIGH (ref 70–99)
Glucose-Capillary: 154 mg/dL — ABNORMAL HIGH (ref 70–99)
Glucose-Capillary: 162 mg/dL — ABNORMAL HIGH (ref 70–99)
Glucose-Capillary: 164 mg/dL — ABNORMAL HIGH (ref 70–99)
Glucose-Capillary: 229 mg/dL — ABNORMAL HIGH (ref 70–99)
Glucose-Capillary: 95 mg/dL (ref 70–99)

## 2020-12-23 LAB — CBC
HCT: 33.5 % — ABNORMAL LOW (ref 39.0–52.0)
Hemoglobin: 10.2 g/dL — ABNORMAL LOW (ref 13.0–17.0)
MCH: 27.7 pg (ref 26.0–34.0)
MCHC: 30.4 g/dL (ref 30.0–36.0)
MCV: 91 fL (ref 80.0–100.0)
Platelets: 345 10*3/uL (ref 150–400)
RBC: 3.68 MIL/uL — ABNORMAL LOW (ref 4.22–5.81)
RDW: 14.6 % (ref 11.5–15.5)
WBC: 5.8 10*3/uL (ref 4.0–10.5)
nRBC: 0 % (ref 0.0–0.2)

## 2020-12-23 LAB — MAGNESIUM: Magnesium: 2.1 mg/dL (ref 1.7–2.4)

## 2020-12-23 MED ORDER — FOSFOMYCIN TROMETHAMINE 3 G PO PACK
3.0000 g | PACK | Freq: Once | ORAL | Status: AC
  Administered 2020-12-23: 3 g via ORAL
  Filled 2020-12-23: qty 3

## 2020-12-23 NOTE — Plan of Care (Signed)
Not progressing at this time due to altered mental status.   Problem: Education: Goal: Knowledge of General Education information will improve Description: Including pain rating scale, medication(s)/side effects and non-pharmacologic comfort measures Outcome: Not Progressing   Problem: Health Behavior/Discharge Planning: Goal: Ability to manage health-related needs will improve Outcome: Not Progressing   Problem: Clinical Measurements: Goal: Ability to maintain clinical measurements within normal limits will improve Outcome: Not Progressing Goal: Will remain free from infection Outcome: Not Progressing Goal: Diagnostic test results will improve Outcome: Not Progressing Goal: Respiratory complications will improve Outcome: Not Progressing Goal: Cardiovascular complication will be avoided Outcome: Not Progressing   Problem: Activity: Goal: Risk for activity intolerance will decrease Outcome: Not Progressing   Problem: Nutrition: Goal: Adequate nutrition will be maintained Outcome: Not Progressing   Problem: Coping: Goal: Level of anxiety will decrease Outcome: Not Progressing   Problem: Elimination: Goal: Will not experience complications related to bowel motility Outcome: Not Progressing Goal: Will not experience complications related to urinary retention Outcome: Not Progressing   Problem: Pain Managment: Goal: General experience of comfort will improve Outcome: Not Progressing   Problem: Safety: Goal: Ability to remain free from injury will improve Outcome: Not Progressing

## 2020-12-23 NOTE — Progress Notes (Signed)
Inpatient Rehabilitation Admissions Coordinator   Patient continues to not be at a level for intensive CIR rehab.  Ottie Glazier, RN, MSN Rehab Admissions Coordinator [phone] 12/23/2020 8:35 AM

## 2020-12-23 NOTE — Progress Notes (Addendum)
PROGRESS NOTE    Deanna Boehlke  ZOX:[id number] DOB: Jul 06, 1950 DOA: 12/03/2020 PCP: Pcp, No    Brief Narrative:  70 year old male with a history of coronary artery disease, hypertension, hyperlipidemia, admitted to the hospital with shortness of breath.  He was found to be influenza positive, as well as having an elevated troponin.  The day after admission, he was noted to have acute change in neuro status with acute right-sided weakness.  He was found to have left MCA/ACA territory infarct.  Seen by neurology and received tPA.  On 6/10, he had worsening respiratory status requiring intubation.  He was noted to have bilateral lower lobe pneumonia. He was treated with antibiotics and lasix. Subsequently extubated on 6/17. Transferred to [facility] on 6/22. He had recurrence of fever and was found to have Klebsiella ESBL UTI and staph pneumonia. He is currently on Meropenem. He has significant dysphagia and currently receiving nutrition through cortrak. He was also noted to have RLE DVT and started on DOAC. Disposition would be to CIR vs. SNF vs. LTACH.  SLP cleared him for dysphagia 1 diet and honey thickened liquids on 6/28, will continue tube feeds until oral intake satisfactory and if not may need to consider PEG tube placement.   Assessment & Plan:   Principal Problem:   Influenza A Active Problems:   NSTEMI (non-ST elevated myocardial infarction) (HCC)   CAD (coronary artery disease)   Asthma   Essential hypertension   Hyperlipidemia   Dyspnea   Pleural effusion on left   Stroke (cerebrum) (HCC)   Acute respiratory failure with hypoxia (HCC)   Left MCA/ACA territory stroke -he has dysphagia, aphasia and right hemiplegia -Received tPA on 6/9 secondary to symptomatic high-grade proximal left carotid stenosis -Neurology evaluated patient and appear to have signed off. -Currently on antiplatelet therapy with aspirin -he is also on DOAC for 3 months for DVT -once DOAC course is complete, he  will need to be on DAPT with asa and plavix -LDL 55, A1c 6.2 -etiology of stroke likely large vessel atherosclerosis -Outpatient follow-up with neurology  Symptomatic severe left internal carotid artery stenosis -Seen by neuro interventional radiology who recommended outpatient follow-up for ICA stenting if patient makes meaningful neurologic improvement  Non-STEMI -Patient noted to have new ST depressions in V6 and worsening T wave inversions in inferior leads -Seen by cardiology and underwent left heart cath on 6/14 that showed multivessel disease. -Recommendations were for medical management for now -He will need to follow-up with cardiology to be considered for further therapy, provided he makes further neurologic recovery  Acute respiratory failure with hypoxia -Secondary to pneumonia/left pleural effusion/increased respiratory secretions -He was previously treated with antivirals for influenza as well as completed a course of antibiotics -Repeat findings indicate possible new staph pneumonia -Continue chest physical therapy -oxygen requirements are currently at 3L and continues to have increased secretions requiring suctioning.  Extubated 6/17. -he is at high risk for recurrent aspiration  ESBL Klebsiella UTI -Currently on meropenem-day 4 today. -We will continue with current treatments, has defervesced.  No leukocytosis.  MSSA pneumonia -discontinue vancomycin since he is already on meropenem -continue pulmonary hygiene, still requiring suctioning by RT -clinically improving.   RLE peroneal DVT -venous dopplers show DVT in RLE, below the knee that is age indeterminate -patient does not have any known prior history of DVT  -Previous hospitalist MD discussed with Dr. Vassie Loll, PCCM and it was felt reasonable to hold anticoagulation, monitor for now and repeat venous dopplers in 3-5  days -venous dopplers repeated on 6/28 that showed persistent DVT -since patient is immobile, he  would be considered higher risk extension of DVT -will start on DOAC for 3 months -Previous hospitalist MD discussed with neurology Dr. Roda Shutters, and it was recommended to discontinue plavix while on anticoagulation. He should continue on aspirin -once his anticoagulation course is complete, he should return to DAPT   AKI Resolved.  Type 2 diabetes -A1c of 6.2 -Continue sliding scale insulin and Lantus -blood sugars mildly uncontrolled and fluctuating.  No change in regimen.  Dysphagia -Related to stroke -Currently has cortrak for tube feeding -may need to consider PEG tube if patient will be going to SNF, since SNF would not accept him with cortrak -patient had MBSS today and was started on dysphagia 1/puree diet with honey thick liquids. He is taking a few bites at this time. -will continue tube feeding for now, until his po intake has improved   Urinary retention -Has failed voiding trial twice, but per staff was not on any medications -Has been started on Cardura on 6/21 (cannot take tamsulosin through cortrak) -Reattempted voiding trial on 6/25 and patient appears to be passing urine -Currently has condom catheter in place  Hypertension -stable on metoprolol  Goals of care -seen by palliative care. Family has elected to continue with full scope of treatment  Diarrhea Has a rectal tube.  Continue to monitor output.  May be related to tube feeds.  Nonsustained SVT/VT: 2D echo 12/04/2020: LVEF 55-60%.  Check TSH.  Potassium is greater than 4.  Check can replace magnesium to keep >2.  Continue telemetry.  If has very frequent episodes then may need to uptitrate metoprolol.  Abnormal LFTs: Mildly elevated AST and ALT, stable for the last 3 days.  Follow periodically.  Unclear etiology.  No prior numbers to compare.   DVT prophylaxis: eliquis apixaban (ELIQUIS) tablet 10 mg  apixaban (ELIQUIS) tablet 5 mg  Code Status: full code Family Communication: None at bedside  today. Disposition Plan: Status is: Inpatient  Remains inpatient appropriate because:Ongoing diagnostic testing needed not appropriate for outpatient work up and Inpatient level of care appropriate due to severity of illness.  As per discussion with TOC team this morning, LTAC have started insurance authorization process.  Dispo: The patient is from: Home              Anticipated d/c is to:  CIR  vs. SNF vs. LTACH              Patient currently is not medically stable to d/c.   Difficult to place patient No    Consultants:  Neurology (signed off) PCCM (signed off) Cardiology (signed off) Palliative care medicine.  Procedures:  ETT 6/10>6/17 LHC 6/14  Antimicrobials:  Tamiflu completed 6/13 Azithromycin 6/11 > 6/14 Ceftriaxone 6/11 > 6/15  Vancomycin 6/24> 6/26 Cefepime 6/24 > 6/26 Meropenem 6/26>   Subjective: Patient is nonverbal and does not follow commands either.  Tracks activity with eyes.  Objective: Vitals:   12/23/20 1019 12/23/20 1031 12/23/20 1118 12/23/20 1159  BP: 135/73 124/77 117/70   Pulse: (!) 105 (!) 107 94   Resp:  18 (!) 21   Temp:  98.8 F (37.1 C) 98.8 F (37.1 C)   TempSrc:  Oral Oral   SpO2:  97% 97% 100%  Weight:      Height:        Intake/Output Summary (Last 24 hours) at 12/23/2020 1205 Last data filed at 12/23/2020 0600 Gross  per 24 hour  Intake 3759.16 ml  Output 600 ml  Net 3159.16 ml   Filed Weights   12/18/20 0500 12/19/20 0500 12/23/20 0445  Weight: 94.1 kg 94.3 kg 98.7 kg    Examination:  General exam: Middle-age male, moderately built and nourished lying comfortably propped up in bed without distress. Respiratory system: Slightly diminished breath sounds in the bases but otherwise clear to auscultation.  No increased work of breathing. Cardiovascular system: S1 and S2 heard, RRR.  No JVD, murmurs or pedal edema.  Telemetry personally reviewed: SR in the 90s-ST in the 100s.  Occasional episodes of nonsustained SVT and  even rare episodes of nonsustained VT. Gastrointestinal system: Abdomen is nondistended, soft and nontender. No organomegaly or masses felt. Normal bowel sounds heard. Central nervous system: Alert, tracks activity with eyes but nonverbal.  Attempts to mouth words but unable to.  Dense right hemiplegia i.e. 0/5 power.  Did not see him moving much on his left side either. Extremities: No C/C/E, +pedal pulses.  Has a right heel floater. Skin: No rashes, lesions or ulcers Psychiatry: unable to assess.  Flat affect. ENT: Right nasal core track.    Data Reviewed: I have personally reviewed following labs and imaging studies  CBC: Recent Labs  Lab 12/18/20 0216 12/19/20 0416 12/20/20 0249 12/21/20 0350 12/23/20 0259  WBC 11.9* 8.4 6.9 4.9 5.8  HGB 11.9* 9.5* 9.3* 9.5* 10.2*  HCT 40.4 33.7* 31.2* 32.5* 33.5*  MCV 94.2 95.7 94.3 93.9 91.0  PLT 446* 440* 428* 382 345   Basic Metabolic Panel: Recent Labs  Lab 12/17/20 0341 12/17/20 1833 12/19/20 0416 12/20/20 0249 12/21/20 0350 12/22/20 0416 12/23/20 0259  NA 144   < > 145 148* 144 142 141  K 4.6   < > 3.7 4.5 3.8 4.0 4.2  CL 107   < > 106 113* 110 106 106  CO2 30   < > GLUCOSE 193*   < > 220* 198* 199* 144* 220*  BUN 79*   < > 49* 46* 33* 30* 28*  CREATININE 1.26*   < > 0.89 0.84 0.68 0.64 0.59*  CALCIUM 8.8*   < > 8.4* 8.2* 8.1* 8.5* 8.4*  PHOS 5.9*  --  3.6  --   --   --   --    < > = values in this interval not displayed.   GFR: Estimated Creatinine Clearance: 103.8 mL/min (A) (by C-G formula based on SCr of 0.59 mg/dL (L)). Liver Function Tests: Recent Labs  Lab 12/17/20 0341 12/19/20 0416 12/21/20 0350 12/22/20 0416 12/23/20 0259  AST  --   --  93* 101* 99*  ALT  --   --  100* 123* 130*  ALKPHOS  --   --  71 68 69  BILITOT  --   --  0.5 0.3 0.3  PROT  --   --  5.9* 6.3* 6.1*  ALBUMIN 2.2* 1.9* 1.7* 1.9* 2.0*    CBG: Recent Labs  Lab 12/22/20 1655 12/22/20 2002 12/23/20 0013  12/23/20 0430 12/23/20 0730  GLUCAP 186* 175* 109* 229* 164*    Recent Results (from the past 240 hour(s))  Culture, Respiratory w Gram Stain     Status: None   Collection Time: 12/17/20  5:58 PM   Specimen: Tracheal Aspirate; Respiratory  Result Value Ref Range Status   Specimen Description TRACHEAL ASPIRATE  Final   Special Requests NONE  Final   Gram Stain   Final  ABUNDANT WBC PRESENT,BOTH PMN AND MONONUCLEAR ABUNDANT GRAM POSITIVE COCCI IN PAIRS RARE GRAM POSITIVE RODS Performed at [facility], [address], [place], [place] [address]    Culture ABUNDANT STAPHYLOCOCCUS AUREUS  Final   Report Status 12/20/2020 FINAL  Final   Organism ID, Bacteria STAPHYLOCOCCUS AUREUS  Final      Susceptibility   Staphylococcus aureus - MIC*    CIPROFLOXACIN <=0.5 SENSITIVE Sensitive     ERYTHROMYCIN >=8 RESISTANT Resistant     GENTAMICIN <=0.5 SENSITIVE Sensitive     OXACILLIN 0.5 SENSITIVE Sensitive     TETRACYCLINE <=1 SENSITIVE Sensitive     VANCOMYCIN <=0.5 SENSITIVE Sensitive     TRIMETH/SULFA <=10 SENSITIVE Sensitive     CLINDAMYCIN RESISTANT Resistant     RIFAMPIN <=0.5 SENSITIVE Sensitive     Inducible Clindamycin POSITIVE Resistant     * ABUNDANT STAPHYLOCOCCUS AUREUS  Culture, Urine     Status: Abnormal   Collection Time: 12/18/20  8:29 AM   Specimen: Urine, Random  Result Value Ref Range Status   Specimen Description URINE, RANDOM  Final   Special Requests   Final    NONE Performed at [facility], [address], [place], [place] [address]    Culture (A)  Final    >=100,000 COLONIES/mL KLEBSIELLA PNEUMONIAE Confirmed Extended Spectrum Beta-Lactamase Producer (ESBL).  In bloodstream infections from ESBL organisms, carbapenems are preferred over piperacillin/tazobactam. They are shown to have a lower risk of mortality.    Report Status 12/20/2020 FINAL  Final   Organism ID, Bacteria KLEBSIELLA PNEUMONIAE (A)  Final      Susceptibility   Klebsiella  pneumoniae - MIC*    AMPICILLIN >=32 RESISTANT Resistant     CEFAZOLIN >=64 RESISTANT Resistant     CEFEPIME 2 SENSITIVE Sensitive     CEFTRIAXONE >=64 RESISTANT Resistant     CIPROFLOXACIN 0.5 SENSITIVE Sensitive     GENTAMICIN <=1 SENSITIVE Sensitive     IMIPENEM <=0.25 SENSITIVE Sensitive     NITROFURANTOIN 64 INTERMEDIATE Intermediate     TRIMETH/SULFA >=320 RESISTANT Resistant     AMPICILLIN/SULBACTAM 8 SENSITIVE Sensitive     PIP/TAZO <=4 SENSITIVE Sensitive     * >=100,000 COLONIES/mL KLEBSIELLA PNEUMONIAE  MRSA Next Gen by PCR, Nasal     Status: None   Collection Time: 12/18/20 10:38 AM   Specimen: Nasal Mucosa; Nasal Swab  Result Value Ref Range Status   MRSA by PCR Next Gen NOT DETECTED NOT DETECTED Final    Comment: (NOTE) The GeneXpert MRSA Assay (FDA approved for NASAL specimens only), is one component of a comprehensive MRSA colonization surveillance program. It is not intended to diagnose MRSA infection nor to guide or monitor treatment for MRSA infections. Test performance is not FDA approved in patients less than 28 years old. Performed at [facility], [address], [place], [place] [address]   Culture, blood (Routine X 2) w Reflex to ID Panel     Status: None   Collection Time: 12/18/20 11:27 AM   Specimen: BLOOD RIGHT ARM  Result Value Ref Range Status   Specimen Description BLOOD RIGHT ARM  Final   Special Requests   Final    BOTTLES DRAWN AEROBIC ONLY Blood Culture adequate volume   Culture   Final    NO GROWTH 5 DAYS Performed at [facility], [address] [place], [place] [address]    Report Status 12/23/2020 FINAL  Final  Culture, blood (Routine X  2) w Reflex to ID Panel     Status: None   Collection Time: 12/18/20 11:30 AM   Specimen: BLOOD RIGHT HAND  Result Value Ref Range Status   Specimen Description BLOOD RIGHT HAND  Final   Special Requests   Final    BOTTLES DRAWN AEROBIC ONLY Blood Culture results may not be optimal due  to an inadequate volume of blood received in culture bottles   Culture   Final    NO GROWTH 5 DAYS Performed at [facility], [address] [place], [place] [address]    Report Status 12/23/2020 FINAL  Final         Radiology Studies: DG Swallowing Func-Speech Pathology  Result Date: 12/22/2020 Formatting of this result is different from the original. Objective Swallowing Evaluation: Type of Study: MBS-Modified Barium Swallow Study  Patient Details Name: Christene LyeSalem Blais MRN: [id number] Date of Birth: 12/27/1950 Today's Date: 12/22/2020 Time: SLP Start Time (ACUTE ONLY): 1310 -SLP Stop Time (ACUTE ONLY): 1335 SLP Time Calculation (min) (ACUTE ONLY): 25 min Past Medical History: Past Medical History: Diagnosis Date  Asthma-COPD overlap syndrome (HCC)   CAD (coronary artery disease)   HLD (hyperlipidemia)   HTN (hypertension)   Lumbar disc herniation with radiculopathy   Lumbar stenosis   Osteoporosis  Past Surgical History: Past Surgical History: Procedure Laterality Date  CATARACT EXTRACTION, BILATERAL  2021  CORONARY ARTERY BYPASS GRAFT  2005  INGUINAL HERNIA REPAIR  1998  LEFT HEART CATH AND CORS/GRAFTS ANGIOGRAPHY N/A 12/08/2020  Procedure: LEFT HEART CATH AND CORS/GRAFTS ANGIOGRAPHY;  Surgeon: Orpah CobbKadakia, Ajay, MD;  Location: [facility] INVASIVE CV LAB;  Service: Cardiovascular;  Laterality: N/A;  LUMBAR DISC SURGERY  2019 HPI: Mr. Christene LyeSalem Leikam is a 70 y.o. male admitted 6/9 that  presented with SOB, was found to be Influenza A + and later on day of admission developed mild R facial droop, R arm drift and hand grip weakness along with R leg weakness. Left MCA and ACA CVA. Pt received tPa. VDRF 6/11-6/15. PMH: CAD, HTN, lumbar stenosis, HLD  Subjective: upright in chair for procedure Assessment / Plan / Recommendation CHL IP CLINICAL IMPRESSIONS 12/22/2020 Clinical Impression Pt presents with a moderate oropharyngeal dysphagia of neurogenic etiology and still exhibits some oral apraxia though greatly  improving since admission. Orally pt with reduced labial seal with intermittent right anterior spillage, oral residuals (right greater than left), and reduced bolus cohesion. Pt was unable to propel initial trial of nectar thick by tsp to pharynx, necessitating larger volume for adequate oral transit. Cup sip of nectar thick liquid resulted in premature spillage with mistimed epiglottic inversion, poor airway protection and sensed but not cleared laryngeal penetration and tracheal aspiration. Pt with oral residuals across all POs. Honey thick liquids resulted in transient laryngeal penetration during the swallow, clearing reflexively. Intact airway protection noted with puree. Moderate oral residuals however appreciated. Trace vallecular and pyriform sinu residuals noted with POs. Recommend dysphagia 1 (puree) and honey thick liquids withs meds crushed in puree. Full supervision and full feeding asssitance. Pt requires oral care prior to and following PO consumption as likelihood for aspiration from residuals and pt saliva is increased. SLP to closely follow. SLP Visit Diagnosis Dysphagia, oropharyngeal phase (R13.12) Attention and concentration deficit following -- Frontal lobe and executive function deficit following -- Impact on safety and function Moderate aspiration risk   CHL IP TREATMENT RECOMMENDATION 12/22/2020 Treatment Recommendations Therapy as outlined in treatment plan below   Prognosis 12/22/2020 Prognosis for Safe Diet  Advancement Good Barriers to Reach Goals Severity of deficits;Time post onset Barriers/Prognosis Comment -- CHL IP DIET RECOMMENDATION 12/22/2020 SLP Diet Recommendations Honey thick liquids;Dysphagia 1 (Puree) solids Liquid Administration via Cup Medication Administration Crushed with puree Compensations Slow rate;Small sips/bites;Multiple dry swallows after each bite/sip;Follow solids with liquid Postural Changes Remain semi-upright after after feeds/meals (Comment);Seated upright at 90  degrees   CHL IP OTHER RECOMMENDATIONS 12/22/2020 Recommended Consults -- Oral Care Recommendations Oral care before and after PO;Staff/trained caregiver to provide oral care;Oral care BID Other Recommendations Order thickener from pharmacy   CHL IP FOLLOW UP RECOMMENDATIONS 12/22/2020 Follow up Recommendations Skilled Nursing facility;24 hour supervision/assistance   CHL IP FREQUENCY AND DURATION 12/22/2020 Speech Therapy Frequency (ACUTE ONLY) min 2x/week Treatment Duration 2 weeks      CHL IP ORAL PHASE 12/22/2020 Oral Phase Impaired Oral - Pudding Teaspoon -- Oral - Pudding Cup -- Oral - Honey Teaspoon -- Oral - Honey Cup Incomplete tongue to palate contact;Reduced posterior propulsion;Holding of bolus;Pocketing in anterior sulcus;Lingual/palatal residue;Delayed oral transit;Decreased bolus cohesion;Premature spillage;Right anterior bolus loss;Weak lingual manipulation Oral - Nectar Teaspoon Lingual/palatal residue;Decreased bolus cohesion;Holding of bolus;Reduced posterior propulsion;Weak lingual manipulation Oral - Nectar Cup Delayed oral transit;Decreased bolus cohesion;Lingual/palatal residue;Reduced posterior propulsion;Incomplete tongue to palate contact;Weak lingual manipulation;Right anterior bolus loss Oral - Nectar Straw -- Oral - Thin Teaspoon -- Oral - Thin Cup -- Oral - Thin Straw -- Oral - Puree Delayed oral transit;Lingual/palatal residue;Piecemeal swallowing;Reduced posterior propulsion;Incomplete tongue to palate contact;Weak lingual manipulation Oral - Mech Soft -- Oral - Regular -- Oral - Multi-Consistency -- Oral - Pill -- Oral Phase - Comment --  CHL IP PHARYNGEAL PHASE 12/22/2020 Pharyngeal Phase Impaired Pharyngeal- Pudding Teaspoon -- Pharyngeal -- Pharyngeal- Pudding Cup -- Pharyngeal -- Pharyngeal- Honey Teaspoon -- Pharyngeal -- Pharyngeal- Honey Cup Delayed swallow initiation-pyriform sinuses;Reduced epiglottic inversion;Penetration/Aspiration during swallow;Pharyngeal residue -  valleculae;Pharyngeal residue - pyriform;Reduced tongue base retraction;Reduced pharyngeal peristalsis Pharyngeal Material enters airway, remains ABOVE vocal cords then ejected out Pharyngeal- Nectar Teaspoon Other (Comment) Pharyngeal -- Pharyngeal- Nectar Cup Delayed swallow initiation-pyriform sinuses;Reduced epiglottic inversion;Reduced anterior laryngeal mobility;Reduced airway/laryngeal closure;Penetration/Aspiration before swallow;Penetration/Aspiration during swallow;Moderate aspiration;Pharyngeal residue - valleculae;Pharyngeal residue - pyriform Pharyngeal Material enters airway, passes BELOW cords and not ejected out despite cough attempt by patient Pharyngeal- Nectar Straw -- Pharyngeal -- Pharyngeal- Thin Teaspoon -- Pharyngeal -- Pharyngeal- Thin Cup -- Pharyngeal -- Pharyngeal- Thin Straw -- Pharyngeal -- Pharyngeal- Puree Delayed swallow initiation-vallecula;Reduced pharyngeal peristalsis;Reduced tongue base retraction;Pharyngeal residue - valleculae Pharyngeal -- Pharyngeal- Mechanical Soft -- Pharyngeal -- Pharyngeal- Regular -- Pharyngeal -- Pharyngeal- Multi-consistency -- Pharyngeal -- Pharyngeal- Pill -- Pharyngeal -- Pharyngeal Comment --  CHL IP CERVICAL ESOPHAGEAL PHASE 12/22/2020 Cervical Esophageal Phase WFL Pudding Teaspoon -- Pudding Cup -- Honey Teaspoon -- Honey Cup -- Nectar Teaspoon -- Nectar Cup -- Nectar Straw -- Thin Teaspoon -- Thin Cup -- Thin Straw -- Puree -- Mechanical Soft -- Regular -- Multi-consistency -- Pill -- Cervical Esophageal Comment -- Chelsea E Hartness MA, CCC-SLP 12/22/2020, 2:14 PM              VAS [place] LOWER EXTREMITY VENOUS (DVT)  Result Date: 12/22/2020  Lower Venous DVT Study Patient Name:  WAYLYN TENBRINK  Date of Exam:   12/22/2020 Medical Rec #: [id number]      Accession #:    [id number] Date of Birth: 09-28-1950     Patient Gender: M Patient Age:   69Y Exam Location:  [facility] Procedure:      VAS [place] LOWER EXTREMITY  VENOUS (DVT) Referring Phys:  [id number] JEHANZEB MEMON --------------------------------------------------------------------------------  Indications: Follow up peroneal dvt.  Comparison Study: 12/18/20 prior Performing Technologist: [place] Ponder RVS  Examination Guidelines: A complete evaluation includes B-mode imaging, spectral Doppler, color Doppler, and power Doppler as needed of all accessible portions of each vessel. Bilateral testing is considered an integral part of a complete examination. Limited examinations for reoccurring indications may be performed as noted. The reflux portion of the exam is performed with the patient in reverse Trendelenburg.  +---------+---------------+---------+-----------+----------+-------------------+ RIGHT    CompressibilityPhasicitySpontaneityPropertiesThrombus Aging      +---------+---------------+---------+-----------+----------+-------------------+ CFV      Full           Yes      Yes                                      +---------+---------------+---------+-----------+----------+-------------------+ SFJ      Full                                                             +---------+---------------+---------+-----------+----------+-------------------+ FV Prox  Full                                                             +---------+---------------+---------+-----------+----------+-------------------+ FV Mid   Full                                                             +---------+---------------+---------+-----------+----------+-------------------+ FV DistalFull                                                             +---------+---------------+---------+-----------+----------+-------------------+ PFV      Full                                                             +---------+---------------+---------+-----------+----------+-------------------+ POP      Full           Yes      Yes                                       +---------+---------------+---------+-----------+----------+-------------------+ PTV      Full                                                             +---------+---------------+---------+-----------+----------+-------------------+  PERO     Partial                                      Not well visualized +---------+---------------+---------+-----------+----------+-------------------+   +----+---------------+---------+-----------+----------+--------------+ LEFTCompressibilityPhasicitySpontaneityPropertiesThrombus Aging +----+---------------+---------+-----------+----------+--------------+ CFV Full           Yes      Yes                                 +----+---------------+---------+-----------+----------+--------------+     Summary: RIGHT: - 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 - No cystic structure found in the popliteal fossa.  LEFT: - No evidence of common femoral vein obstruction.  *See table(s) above for measurements and observations. Electronically signed by Waverly Ferrari MD on 12/22/2020 at 2:54:55 PM.    Final         Scheduled Meds:  apixaban  10 mg Per Tube BID   Followed by   Melene Muller ON 12/29/2020] apixaban  5 mg Per Tube BID   arformoterol  15 mcg Nebulization BID   aspirin  81 mg Per Tube Daily   atorvastatin  80 mg Per Tube Daily   budesonide (PULMICORT) nebulizer solution  0.5 mg Nebulization BID   chlorhexidine  15 mL Mouth Rinse BID   doxazosin  2 mg Per Tube Daily   ezetimibe  10 mg Per Tube Daily   feeding supplement (PROSource TF)  45 mL Per Tube TID   free water  200 mL Per Tube Q4H   gabapentin  300 mg Per Tube BID   insulin aspart  0-15 Units Subcutaneous Q4H   insulin aspart  8 Units Subcutaneous Q4H   insulin glargine  15 Units Subcutaneous BID   ipratropium-albuterol  3 mL Nebulization QID   mouth rinse  15 mL Mouth Rinse q12n4p   metoprolol tartrate  25 mg Per  Tube BID   montelukast  10 mg Per Tube QHS   pantoprazole sodium  40 mg Per Tube QHS   sodium chloride flush  3 mL Intravenous Q12H   Continuous Infusions:  sodium chloride     feeding supplement (VITAL 1.5 CAL) 65 mL/hr at 12/23/20 0600   meropenem (MERREM) IV 1 g (12/23/20 0453)     LOS: 20 days    Time spent:   Marcellus Scott, MD, [place], [facility]. [facility]  To contact the attending provider between 7A-7P or the covering provider during after hours 7P-7A, please log into the web site [email or web address] and access using universal [place] password for that web site. If you do not have the password, please call the hospital operator.

## 2020-12-23 NOTE — Plan of Care (Signed)
  Problem: Clinical Measurements: Goal: Cardiovascular complication will be avoided Outcome: Progressing   Problem: Clinical Measurements: Goal: Respiratory complications will improve Outcome: Progressing   Problem: Clinical Measurements: Goal: Diagnostic test results will improve Outcome: Progressing   Problem: Nutrition: Goal: Adequate nutrition will be maintained Outcome: Progressing   Problem: Elimination: Goal: Will not experience complications related to bowel motility Outcome: Progressing Goal: Will not experience complications related to urinary retention Outcome: Progressing   Problem: Pain Managment: Goal: General experience of comfort will improve Outcome: Progressing   Problem: Safety: Goal: Ability to remain free from injury will improve Outcome: Progressing   Problem: Skin Integrity: Goal: Risk for impaired skin integrity will decrease Outcome: Progressing   Problem: Ischemic Stroke/TIA Tissue Perfusion: Goal: Complications of ischemic stroke/TIA will be minimized Outcome: Progressing

## 2020-12-23 NOTE — TOC Benefit Eligibility Note (Signed)
Transition of Care [facility]) Benefit Eligibility Note    Patient Details  Name: Brandon Daniel MRN: [id number] Date of Birth: 11/15/1950   Medication/Dose: Everlene Balls  10 MG DAILY   and  ELIQUIS   5 MG BID        Prescription Coverage Preferred Pharmacy: [facility]  and   [facility]     Co-Pay: $ 4.00  FOR EACH  PRESCRIPTION        Additional Notes: PT'S HAS REG MEDICAID OF [place]   : EFF-DATE : 08-26-2019   CO-PAY- $4.00 FOR EACH PRESCRIPTION    Mardene Sayer Phone Number: 12/23/2020, 3:55 PM

## 2020-12-23 NOTE — Progress Notes (Signed)
Physical Therapy Treatment Patient Details Name: Brandon Daniel MRN: [id number] DOB: [birth year] Today's Date: 12/23/2020    History of Present Illness Brandon Daniel is a 70 y.o. male admitted 6/9 that  presented with SOB, was found to be Influenza A + and later on day of admission developed mild R facial droop, R arm drift and hand grip weakness along with R leg weakness. Left MCA and ACA CVA. Pt received tPa. VDRF 6/11-6/15. PMH: CAD, HTN, lumbar stenosis, HLD    PT Comments    Patient continues to slowly progress towards physical therapy goals. Patient required maxA+2 for all aspects of bed mobility. Patient required minA-totalA to maintain sitting balance EOB, continues to push with L UE when given opportunity. Sitting balance improves with L hand in lap. Educated family on sitting to R of patient for stimulation and bringing attention to R side of environment. Continue to recommend SNF for ongoing Physical Therapy.       Follow Up Recommendations  SNF     Equipment Recommendations  Other (comment) (TBD)    Recommendations for Other Services       Precautions / Restrictions Precautions Precautions: Fall;Other (comment) Precaution Comments: cortrak, R hemi Restrictions Weight Bearing Restrictions: No    Mobility  Bed Mobility Overal bed mobility: Needs Assistance Bed Mobility: Rolling;Supine to Sit;Sit to Supine Rolling: Max assist;+2 for physical assistance   Supine to sit: Max assist;+2 for physical assistance Sit to supine: Max assist;+2 for physical assistance   General bed mobility comments: assist for all aspects of bed mobility. Increased time required for participation    Transfers                 General transfer comment: hoyer lift  Ambulation/Gait                 Stairs             Wheelchair Mobility    Modified Rankin (Stroke Patients Only) Modified Rankin (Stroke Patients Only) Pre-Morbid Rankin Score: No  symptoms Modified Rankin: Severe disability     Balance Overall balance assessment: Needs assistance Sitting-balance support: No upper extremity supported;Single extremity supported;Feet supported Sitting balance-Leahy Scale: Poor Sitting balance - Comments: required minA-totalA to sit EOB. Improved sitting balance with L UE in lap Postural control: Posterior lean;Right lateral lean;Left lateral lean                                  Cognition Arousal/Alertness: Awake/alert Behavior During Therapy: Flat affect Overall Cognitive Status: Impaired/Different from baseline Area of Impairment: Attention;Following commands;Problem solving                   Current Attention Level: Focused   Following Commands: Follows one step commands with increased time     Problem Solving: Decreased initiation;Requires verbal cues;Requires tactile cues General Comments: shaking head yes/no but no verbal responses      Exercises Other Exercises Other Exercises: Lateral lean L/R to promote WBing through R UE x 6 each side Other Exercises: LAQ - AROM on L x 10; PROM on R x 10    General Comments        Pertinent Vitals/Pain Pain Assessment: Faces Faces Pain Scale: Hurts a little bit Pain Location: low back with sit to supine; per family he had prior L spine surgery Pain Descriptors / Indicators: Grimacing Pain Intervention(s): Monitored during session;Repositioned;Limited activity within patient's tolerance  Home Living                      Prior Function            PT Goals (current goals can now be found in the care plan section) Acute Rehab PT Goals Patient Stated Goal: not stated PT Goal Formulation: With family Time For Goal Achievement: 01/06/21 Potential to Achieve Goals: Good Progress towards PT goals: Progressing toward goals    Frequency    Min 3X/week      PT Plan Current plan remains appropriate    Co-evaluation PT/OT/SLP  Co-Evaluation/Treatment: Yes Reason for Co-Treatment: Complexity of the patient's impairments (multi-system involvement);For patient/therapist safety;To address functional/ADL transfers PT goals addressed during session: Mobility/safety with mobility;Balance OT goals addressed during session: Other (comment) (mobility and balance)      AM-PAC PT "6 Clicks" Mobility   Outcome Measure  Help needed turning from your back to your side while in a flat bed without using bedrails?: A Lot Help needed moving from lying on your back to sitting on the side of a flat bed without using bedrails?: Total Help needed moving to and from a bed to a chair (including a wheelchair)?: Total Help needed standing up from a chair using your arms (e.g., wheelchair or bedside chair)?: Total Help needed to walk in hospital room?: Total Help needed climbing 3-5 steps with a railing? : Total 6 Click Score: 7    End of Session Equipment Utilized During Treatment: Oxygen;Gait belt Activity Tolerance: Patient tolerated treatment well Patient left: in bed;with call bell/phone within reach;with bed alarm set;with family/visitor present Nurse Communication: Mobility status;Need for lift equipment PT Visit Diagnosis: Muscle weakness (generalized) (M62.81);Hemiplegia and hemiparesis Hemiplegia - Right/Left: Right Hemiplegia - dominant/non-dominant: Dominant Hemiplegia - caused by: Cerebral infarction     Time: 1351-1420 PT Time Calculation (min) (ACUTE ONLY): 29 min  Charges:  $Therapeutic Activity: 8-22 mins                     Justin Meisenheimer A. Dan Humphreys PT, DPT Acute Rehabilitation Services Pager [phone] Office [phone]    Viviann Spare 12/23/2020, 4:49 PM

## 2020-12-23 NOTE — Progress Notes (Signed)
New pressure injury on left lower nare found on assessment of Cortrak tubing this evening by the nurse. Injury was cleansed with normal saline and off loaded with gauze. Charge nurse called to room to assist with assessment. MD made aware, new orders to cleanse and off load by taping the tubing off the injury, MD will contact the Cortrak team tomorrow for reassessment.   MD also made aware of RN concerns regarding aspiration risks with the patient's diet as pt is showing signs of aspiration and coughing with eating/drinking. Orders to make pt NPO immediately, orders placed.

## 2020-12-23 NOTE — Progress Notes (Signed)
Occupational Therapy Treatment Patient Details Name: Brandon Daniel MRN: [id number] DOB: 1951/06/06 Today's Date: 12/23/2020    History of present illness Brandon Daniel is a 70 y.o. male admitted 6/9 that  presented with SOB, was found to be Influenza A + and later on day of admission developed mild R facial droop, R arm drift and hand grip weakness along with R leg weakness. Left MCA and ACA CVA. Pt received tPa. VDRF 6/11-6/15. PMH: CAD, HTN, lumbar stenosis, HLD   OT comments  Patient making incremental gains to patient focused OT goals.  Patient continues to try hard, and is following commands better.  Noted 2 finger sublux to L shoulder.  Patient continues to require a lot of cueing to attend to his R visual field and body.  Max A of two for basic mobility, and near total assist for sit balance.  Family educated to stay on patient's right side for stimulation, also discussed therapeutic input to R arm and leg.  Family verbalized understanding.  CIR continues to be recommended for now, but OT will follow in the acute setting to maximize her functional status.    Follow Up Recommendations  CIR    Equipment Recommendations  Wheelchair cushion (measurements OT);Wheelchair (measurements OT);Hospital bed    Recommendations for Other Services      Precautions / Restrictions Precautions Precautions: Fall;Other (comment) Precaution Comments: cortrak, R hemi Restrictions Weight Bearing Restrictions: No       Mobility Bed Mobility   Bed Mobility: Rolling;Supine to Sit;Sit to Supine Rolling: Max assist;+2 for physical assistance   Supine to sit: Max assist;+2 for physical assistance Sit to supine: Max assist;+2 for physical assistance        Transfers                 General transfer comment: hoyer lift    Balance Overall balance assessment: Needs assistance Sitting-balance support: No upper extremity supported;Single extremity supported;Feet supported Sitting  balance-Leahy Scale: Poor Sitting balance - Comments: sat EOB for reaching and leaning activities Postural control: Posterior lean;Right lateral lean;Left lateral lean                                 ADL either performed or assessed with clinical judgement   ADL       Grooming: Wash/dry face;Moderate assistance;Bed level;Minimal assistance                                       Vision   Alignment/Gaze Preference: Head turned;Gaze left Tracking/Visual Pursuits: Decreased smoothness of eye movement to RIGHT superior field;Requires cues, head turns, or add eye shifts to track Visual Fields: Right visual field deficit              Cognition Arousal/Alertness: Awake/alert Behavior During Therapy: Flat affect                           Following Commands: Follows one step commands with increased time     Problem Solving: Decreased initiation;Requires verbal cues;Requires tactile cues          Exercises     Shoulder Instructions       General Comments      Pertinent Vitals/ Pain       Pain Assessment: Faces Faces Pain Scale: Hurts a little bit  Pain Location: low back with sit to supine; per family he had prior L spine surgery Pain Intervention(s): Monitored during session                                                          Frequency  Min 2X/week        Progress Toward Goals  OT Goals(current goals can now be found in the care plan section)     Acute Rehab OT Goals Patient Stated Goal: not stated OT Goal Formulation: Patient unable to participate in goal setting Time For Goal Achievement: 12/25/20 Potential to Achieve Goals: Good  Plan Discharge plan remains appropriate;Frequency remains appropriate    Co-evaluation    PT/OT/SLP Co-Evaluation/Treatment: Yes Reason for Co-Treatment: Complexity of the patient's impairments (multi-system involvement);For patient/therapist safety    OT goals addressed during session: Other (comment) (mobility and balance)      AM-PAC OT "6 Clicks" Daily Activity     Outcome Measure   Help from another person eating meals?: Total Help from another person taking care of personal grooming?: A Lot Help from another person toileting, which includes using toliet, bedpan, or urinal?: Total Help from another person bathing (including washing, rinsing, drying)?: Total Help from another person to put on and taking off regular upper body clothing?: A Lot Help from another person to put on and taking off regular lower body clothing?: Total 6 Click Score: 8    End of Session    OT Visit Diagnosis: Unsteadiness on feet (R26.81);Hemiplegia and hemiparesis;Cognitive communication deficit (R41.841) Symptoms and signs involving cognitive functions: Cerebral infarction Hemiplegia - Right/Left: Right Hemiplegia - dominant/non-dominant: Dominant Hemiplegia - caused by: Cerebral infarction   Activity Tolerance Patient tolerated treatment well   Patient Left in bed;with bed alarm set;with call bell/phone within reach;with family/visitor present   Nurse Communication          Time: 1478-2956 OT Time Calculation (min): 34 min  Charges: OT General Charges $OT Visit: 1 Visit OT Treatments $Therapeutic Activity: 8-22 mins  12/23/2020  Rich, OTR/L  Acute Rehabilitation Services  Office:  [phone]    [name] 12/23/2020, 2:31 PM

## 2020-12-23 NOTE — TOC Benefit Eligibility Note (Signed)
Transition of Care [facility]) Benefit Eligibility Note    Patient Details  Name: Brandon Daniel MRN: [id number] Date of Birth: April 15, 1951   Medication/Dose: Everlene Balls  10 MG DAILY   and  ELIQUIS   5 MG BID        Prescription Coverage Preferred Pharmacy: [facility]  and   [facility]     Co-Pay: $ 4.00  FOR EACH  PRESCRIPTION        Additional Notes: PT'S HAS REG MEDICAID OF [place]   : EFF-DATE : 08-26-2019   CO-PAY- $4.00 FOR EACH PRESCRIPTION    Mardene Sayer Phone Number: 12/23/2020, 3:50 PM

## 2020-12-24 ENCOUNTER — Inpatient Hospital Stay (HOSPITAL_COMMUNITY): Payer: Medicare (Managed Care)

## 2020-12-24 DIAGNOSIS — I63232 Cerebral infarction due to unspecified occlusion or stenosis of left carotid arteries: Secondary | ICD-10-CM | POA: Diagnosis not present

## 2020-12-24 DIAGNOSIS — L8990 Pressure ulcer of unspecified site, unspecified stage: Secondary | ICD-10-CM | POA: Insufficient documentation

## 2020-12-24 DIAGNOSIS — J690 Pneumonitis due to inhalation of food and vomit: Secondary | ICD-10-CM | POA: Diagnosis not present

## 2020-12-24 LAB — GLUCOSE, CAPILLARY
Glucose-Capillary: 172 mg/dL — ABNORMAL HIGH (ref 70–99)
Glucose-Capillary: 155 mg/dL — ABNORMAL HIGH (ref 70–99)
Glucose-Capillary: 151 mg/dL — ABNORMAL HIGH (ref 70–99)
Glucose-Capillary: 144 mg/dL — ABNORMAL HIGH (ref 70–99)
Glucose-Capillary: 171 mg/dL — ABNORMAL HIGH (ref 70–99)

## 2020-12-24 MED ORDER — IPRATROPIUM-ALBUTEROL 0.5-2.5 (3) MG/3ML IN SOLN
3.0000 mL | Freq: Three times a day (TID) | RESPIRATORY_TRACT | Status: DC
  Administered 2020-12-24 – 2020-12-26 (×4): 3 mL via RESPIRATORY_TRACT
  Filled 2020-12-24 (×4): qty 3

## 2020-12-24 MED ORDER — JEVITY 1.5 CAL/FIBER PO LIQD
1000.0000 mL | ORAL | Status: DC
  Administered 2020-12-24 – 2020-12-25 (×3): 1000 mL
  Filled 2020-12-24 (×5): qty 1000

## 2020-12-24 NOTE — Progress Notes (Signed)
  Speech Language Pathology Treatment: Dysphagia;Cognitive-Linquistic  Patient Details Name: Brandon Daniel MRN: [id number] DOB: Feb 10, 1951 Today's Date: 12/24/2020 Time: 5277-8242 SLP Time Calculation (min) (ACUTE ONLY): 45 min  Assessment / Plan / Recommendation Clinical Impression  Note prior swallowing difficulty yesterday as reviewed in chart per nursing and pt changed to NPO.   SLP continued therapeutic PO trials of puree and honey thick liquids via tsp following diligent oral care. 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 Single episode of cough x1 noted only with honey thick via cup sip. No overt coughing or s/sx of aspiration exhibited with strategies listed above. SLP to continue to follow.   Cognitive linguistic tx: Pt was alert, able to follow some simple commands verbally during oral care and PO trials with SLP. Pt nodding head to appropriate questions, though continues to exhibit left gaze preference. He was able to redirect attention to right visual field with cues. No verbal language output exhibited despite cueing.    HPI HPI: [name] is a 70 y.o. male admitted 6/9 that  presented with SOB, was found to be Influenza A + and later on day of admission developed mild R facial droop, R arm drift and hand grip weakness along with R leg weakness. Left MCA and ACA CVA. Pt received tPa. VDRF 6/11-6/15. PMH: CAD, HTN, lumbar stenosis, HLD      SLP Plan  Continue with current plan of  care       Recommendations  Diet recommendations: Dysphagia 1 (puree);Honey-thick liquid (honey thick via tsp) Liquids provided via: Teaspoon Medication Administration: Crushed with puree Supervision: Full supervision/cueing for compensatory strategies;Trained caregiver to feed patient Compensations: Slow rate;Small sips/bites;Minimize environmental distractions;Follow solids with liquid;Monitor for anterior loss;Other (Comment) (use tactile assist for jaw closing for any oral apraxia exhibited by pt) Postural Changes and/or Swallow Maneuvers: Seated upright 90 degrees;Upright 30-60 min after meal                Oral Care Recommendations: Oral care BID;Oral care before and after PO;Staff/trained caregiver to provide oral care Follow up Recommendations: Skilled Nursing facility SLP Visit Diagnosis: Dysphagia, oropharyngeal phase (R13.12);Aphasia (R47.01);Cognitive communication deficit (R41.841) Plan: Continue with current plan of care       GO                Ardyth Gal MA, CCC-SLP Acute Rehabilitation Services   12/24/2020, 9:38 AM

## 2020-12-24 NOTE — Progress Notes (Signed)
Cortrak Tube Team Note:  Unit RD was following up on pt and RN informed RD that pt had developed a pressure injury at the site of the bridle. Cortrak team made aware by unit RD. Unsure what caused pressure injury as the bridle was not noted to be too tight or too close to the pt's nostrils. Replaced bridle and provided additional slack so that tube could be repositioned so that the nasal cannula can rest under the bridle and so that the tube can be placed in a manner that isn't pulling down and applying pressure to the site of the injury. Pt nodded yes when asked if this alleviated the discomfort. Cortrak was bridled at the same centimeter marking (75cm) and was not displaced during the bridle replacement process. Staff should ensure that the nasal cannula continues to rest under the bridle and should also ensure the tube isn't pulling down on the bridle.   If the tube becomes dislodged please keep the tube and contact the Cortrak team at [email or web address] (password [email or web address]) for replacement.  If after hours and replacement cannot be delayed, place a NG tube and confirm placement with an abdominal x-ray.    Eugene Gavia, MS, RD, LDN (she/her/hers) RD pager number and weekend/on-call pager number located in Amion.

## 2020-12-24 NOTE — Plan of Care (Signed)
  Problem: Education: Goal: Knowledge of General Education information will improve Description: Including pain rating scale, medication(s)/side effects and non-pharmacologic comfort measures Outcome: Progressing   Problem: Health Behavior/Discharge Planning: Goal: Ability to manage health-related needs will improve Outcome: Progressing   Problem: Clinical Measurements: Goal: Ability to maintain clinical measurements within normal limits will improve Outcome: Progressing Goal: Will remain free from infection Outcome: Progressing Goal: Diagnostic test results will improve Outcome: Progressing Goal: Respiratory complications will improve Outcome: Progressing Goal: Cardiovascular complication will be avoided Outcome: Progressing   Problem: Activity: Goal: Risk for activity intolerance will decrease Outcome: Progressing   Problem: Nutrition: Goal: Adequate nutrition will be maintained Outcome: Progressing   Problem: Coping: Goal: Level of anxiety will decrease Outcome: Progressing   Problem: Safety: Goal: Ability to remain free from injury will improve Outcome: Progressing   Problem: Pain Managment: Goal: General experience of comfort will improve Outcome: Progressing   Problem: Health Behavior/Discharge Planning: Goal: Ability to manage health-related needs will improve Outcome: Progressing   Problem: Coping: Goal: Will verbalize positive feelings about self Outcome: Progressing Goal: Will identify appropriate support needs Outcome: Progressing   Problem: Education: Goal: Knowledge of disease or condition will improve Outcome: Progressing Goal: Knowledge of secondary prevention will improve Outcome: Progressing Goal: Knowledge of patient specific risk factors addressed and post discharge goals established will improve Outcome: Progressing   Problem: Self-Care: Goal: Ability to participate in self-care as condition permits will improve Outcome:  Progressing Goal: Verbalization of feelings and concerns over difficulty with self-care will improve Outcome: Progressing Goal: Ability to communicate needs accurately will improve Outcome: Progressing   Problem: Ischemic Stroke/TIA Tissue Perfusion: Goal: Complications of ischemic stroke/TIA will be minimized Outcome: Progressing

## 2020-12-24 NOTE — Progress Notes (Addendum)
PROGRESS NOTE    Brandon LyeSalem Daniel  ZOX:[id number] DOB: 28-Jul-1950 DOA: 12/03/2020 PCP: Pcp, No    Brief Narrative:  70 year old male with a history of coronary artery disease, hypertension, hyperlipidemia, admitted to the hospital with shortness of breath.  He was found to be influenza positive, as well as having an elevated troponin.  The day after admission, he was noted to have acute change in neuro status with acute right-sided weakness.  He was found to have left MCA/ACA territory infarct.  Seen by neurology and received tPA.  On 6/10, he had worsening respiratory status requiring intubation.  He was noted to have bilateral lower lobe pneumonia. He was treated with antibiotics and lasix. Subsequently extubated on 6/17. [facility]. He had recurrence of fever and was found to have Klebsiella ESBL UTI and staph pneumonia. He is currently on Meropenem. He has significant dysphagia and currently receiving nutrition through cortrak. He was also noted to have RLE DVT and started on DOAC. Disposition would be to CIR vs. SNF vs. LTACH.  SLP cleared him for dysphagia 1 diet and honey thickened liquids on 6/28, will continue tube feeds until oral intake satisfactory and if not may need to consider PEG tube placement.   Assessment & Plan:   Principal Problem:   Influenza A Active Problems:   NSTEMI (non-ST elevated myocardial infarction) (HCC)   CAD (coronary artery disease)   Asthma   Essential hypertension   Hyperlipidemia   Dyspnea   Pleural effusion on left   Stroke (cerebrum) (HCC)   Acute respiratory failure with hypoxia (HCC)   Pressure injury of skin   Left MCA/ACA territory stroke -he has dysphagia, aphasia and right hemiplegia -Received tPA on 6/9 secondary to symptomatic high-grade proximal left carotid stenosis -Neurology evaluated patient and appear to have signed off. 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 -Recommendations were for medical management for now -He will need to follow-up with cardiology to be considered for further therapy, provided he makes further neurologic recovery  Acute respiratory failure with hypoxia -Secondary to pneumonia/left pleural effusion/increased respiratory secretions -He was previously treated with antivirals for influenza as well as completed a course of antibiotics -Repeat findings indicate possible new staph pneumonia -Continue chest physical therapy -oxygen requirements are currently at 3L and continues to have increased secretions requiring suctioning.  Extubated 6/17. -he is at high risk for recurrent aspiration -Repeat chest x-ray 6/30 without acute findings.  ESBL Klebsiella UTI -Completed course of antibiotics including 2 days of cefepime followed by 3 days of meropenem and finally a dose of fosfomycin on 6/29.  MSSA pneumonia Completed course of antibiotics including 2 days of cefepime, 3 days of vancomycin, 3 days of meropenem.  RLE peroneal DVT -venous dopplers show DVT in RLE, below the knee that is age indeterminate -patient does not have any known prior history of DVT  -Previous hospitalist MD discussed with Dr. Vassie LollAlva,  PCCM and it was felt reasonable to hold anticoagulation, monitor for now and repeat venous dopplers in 3-5 days -venous dopplers repeated on 6/28  that showed persistent DVT -since patient is immobile, he would be considered higher risk extension of DVT -will start on DOAC for 3 months -Previous hospitalist MD discussed with neurology Dr. Roda Shutters, and it was recommended to discontinue plavix while on anticoagulation. He should continue on aspirin -once his anticoagulation course is complete, he should return to DAPT   AKI Resolved.  Type 2 diabetes -A1c of 6.2 -Continue sliding scale insulin and Lantus -blood sugars mildly uncontrolled and fluctuating.  No change in regimen.  Dysphagia -Related to stroke -Currently has cortrak for tube feeding -may need to consider PEG tube if patient will be going to SNF, since SNF would not accept him with cortrak -Evaluated patient along with speech therapy at bedside.  Remains on dysphagia 1 diet and honey thickened liquids.  ST discussed extensively with RN regarding feeding techniques.  Urinary retention -Has failed voiding trial twice, but per staff was not on any medications -Has been started on Cardura on 6/21 (cannot take tamsulosin through cortrak) -Resolved.  Hypertension -stable on metoprolol  Goals of care -seen by palliative care. Family has elected to continue with full scope of treatment  Diarrhea Has a rectal tube.  Continue to monitor output.  May be related to tube feeds.  Nonsustained SVT/VT: 2D echo 12/04/2020: LVEF 55-60%.  Check TSH.  Potassium is greater than 4.  Magnesium 2.1.  Continue telemetry.  If has very frequent episodes then may need to uptitrate metoprolol.  Abnormal LFTs: Mildly elevated AST and ALT, stable for the last 3 days.  Follow periodically.  Unclear etiology.  No prior numbers to compare.   DVT prophylaxis: eliquis apixaban (ELIQUIS) tablet 10 mg  apixaban (ELIQUIS) tablet 5 mg  Code Status: full code Family Communication: None at bedside today. Disposition Plan: Status is: Inpatient  Remains inpatient appropriate because:Ongoing diagnostic  testing needed not appropriate for outpatient work up and Inpatient level of care appropriate due to severity of illness.  As per discussion with TOC team this morning, LTAC was declined by insurance.  Dispo: The patient is from: Home              Anticipated d/c is to:  CIR  vs. SNF vs. LTACH              Patient currently is not medically stable to d/c.   Difficult to place patient No    Consultants:  Neurology (signed off) PCCM (signed off) Cardiology (signed off) Palliative care medicine.  Procedures:  ETT 6/10>6/17 LHC 6/14  Antimicrobials:  Tamiflu completed 6/13 Azithromycin 6/11 > 6/14 Ceftriaxone 6/11 > 6/15  Vancomycin 6/24> 6/26 Cefepime 6/24 > 6/26 Meropenem 6/26>   Subjective: Patient seen this morning with ST.  Looks much better.  Sitting up.  Nods to questions.  Nonverbal.  Follows simple instructions.  Objective: Vitals:   12/24/20 0757 12/24/20 1130 12/24/20 1150 12/24/20 1600  BP: (!) 144/73 117/66  125/63  Pulse: 99 95  96  Resp: (!) 22 (!) 21  (!) 23  Temp: 99.8 F (37.7 C) 99.4 F (37.4 C)  99.2 F (37.3 C)  TempSrc:      SpO2: 97% 100% 96% 97%  Weight:      Height:        Intake/Output Summary (Last 24 hours) at 12/24/2020 1703 Last data filed at 12/24/2020 0600 Gross per 24  hour  Intake --  Output 1000 ml  Net -1000 ml   Filed Weights   12/19/20 0500 12/23/20 0445 12/24/20 0438  Weight: 94.3 kg 98.7 kg 97.1 kg    Examination:  General exam: Middle-age male, moderately built and nourished sitting propped up in bed working with ST. Respiratory system: Minimally diminished breath sounds in the bases but otherwise clear to auscultation.  No increased work of breathing. Cardiovascular system: S1 and S2 heard, RRR.  No JVD, murmurs or pedal edema.  Telemetry personally reviewed: SR in the 90s-ST in the 100s.  Single episode of nonsustained SVT at 6:18 PM last night. Gastrointestinal system: Abdomen is nondistended, soft and nontender. No  organomegaly or masses felt. Normal bowel sounds heard. Central nervous system: Mental status as noted above.  Dense right hemiplegia i.e. 0/5 power.  Normal power left limbs.  Waving with his left upper extremity and able to lift left lower extremity off the bed. Extremities: No C/C/E, +pedal pulses.  Has a right heel floater. Skin: No rashes, lesions or ulcers Psychiatry: unable to assess.  Flat affect. ENT: Right nasal core track.  Mild excoriation of inferior and medial aspect of right nostril but no bleeding.  Some crusting.    Data Reviewed: I have personally reviewed following labs and imaging studies  CBC: Recent Labs  Lab 12/18/20 0216 12/19/20 0416 12/20/20 0249 12/21/20 0350 12/23/20 0259  WBC 11.9* 8.4 6.9 4.9 5.8  HGB 11.9* 9.5* 9.3* 9.5* 10.2*  HCT 40.4 33.7* 31.2* 32.5* 33.5*  MCV 94.2 95.7 94.3 93.9 91.0  PLT 446* 440* 428* 382 345   Basic Metabolic Panel: Recent Labs  Lab 12/19/20 0416 12/20/20 0249 12/21/20 0350 12/22/20 0416 12/23/20 0259 12/23/20 1245  NA 145 148* 144 142 141  --   K 3.7 4.5 3.8 4.0 4.2  --   CL 106 113* 110 106 106  --   CO2 31 29 29 30 28   --   GLUCOSE 220* 198* 199* 144* 220*  --   BUN 49* 46* 33* 30* 28*  --   CREATININE 0.89 0.84 0.68 0.64 0.59*  --   CALCIUM 8.4* 8.2* 8.1* 8.5* 8.4*  --   MG  --   --   --   --   --  2.1  PHOS 3.6  --   --   --   --   --    GFR: Estimated Creatinine Clearance: 103 mL/min (A) (by C-G formula based on SCr of 0.59 mg/dL (L)). Liver Function Tests: Recent Labs  Lab 12/19/20 0416 12/21/20 0350 12/22/20 0416 12/23/20 0259  AST  --  93* 101* 99*  ALT  --  100* 123* 130*  ALKPHOS  --  71 68 69  BILITOT  --  0.5 0.3 0.3  PROT  --  5.9* 6.3* 6.1*  ALBUMIN 1.9* 1.7* 1.9* 2.0*    CBG: Recent Labs  Lab 12/23/20 2340 12/24/20 0434 12/24/20 0818 12/24/20 1134 12/24/20 1617  GLUCAP 95 172* 155* 151* 144*    Recent Results (from the past 240 hour(s))  Culture, Respiratory w Gram Stain      Status: None   Collection Time: 12/17/20  5:58 PM   Specimen: Tracheal Aspirate; Respiratory  Result Value Ref Range Status   Specimen Description TRACHEAL ASPIRATE  Final   Special Requests NONE  Final   Gram Stain   Final    ABUNDANT WBC PRESENT,BOTH PMN AND MONONUCLEAR ABUNDANT GRAM POSITIVE COCCI IN PAIRS RARE GRAM POSITIVE  RODS Performed at [facility], [address] [place], [place] [address]    Culture ABUNDANT STAPHYLOCOCCUS AUREUS  Final   Report Status 12/20/2020 FINAL  Final   Organism ID, Bacteria STAPHYLOCOCCUS AUREUS  Final      Susceptibility   Staphylococcus aureus - MIC*    CIPROFLOXACIN <=0.5 SENSITIVE Sensitive     ERYTHROMYCIN >=8 RESISTANT Resistant     GENTAMICIN <=0.5 SENSITIVE Sensitive     OXACILLIN 0.5 SENSITIVE Sensitive     TETRACYCLINE <=1 SENSITIVE Sensitive     VANCOMYCIN <=0.5 SENSITIVE Sensitive     TRIMETH/SULFA <=10 SENSITIVE Sensitive     CLINDAMYCIN RESISTANT Resistant     RIFAMPIN <=0.5 SENSITIVE Sensitive     Inducible Clindamycin POSITIVE Resistant     * ABUNDANT STAPHYLOCOCCUS AUREUS  Culture, Urine     Status: Abnormal   Collection Time: 12/18/20  8:29 AM   Specimen: Urine, Random  Result Value Ref Range Status   Specimen Description URINE, RANDOM  Final   Special Requests   Final    NONE Performed at [facility], [address], [place], [place] [address]    Culture (A)  Final    >=100,000 COLONIES/mL KLEBSIELLA PNEUMONIAE Confirmed Extended Spectrum Beta-Lactamase Producer (ESBL).  In bloodstream infections from ESBL organisms, carbapenems are preferred over piperacillin/tazobactam. They are shown to have a lower risk of mortality.    Report Status 12/20/2020 FINAL  Final   Organism ID, Bacteria KLEBSIELLA PNEUMONIAE (A)  Final      Susceptibility   Klebsiella pneumoniae - MIC*    AMPICILLIN >=32 RESISTANT Resistant     CEFAZOLIN >=64 RESISTANT Resistant     CEFEPIME 2 SENSITIVE Sensitive     CEFTRIAXONE  >=64 RESISTANT Resistant     CIPROFLOXACIN 0.5 SENSITIVE Sensitive     GENTAMICIN <=1 SENSITIVE Sensitive     IMIPENEM <=0.25 SENSITIVE Sensitive     NITROFURANTOIN 64 INTERMEDIATE Intermediate     TRIMETH/SULFA >=320 RESISTANT Resistant     AMPICILLIN/SULBACTAM 8 SENSITIVE Sensitive     PIP/TAZO <=4 SENSITIVE Sensitive     * >=100,000 COLONIES/mL KLEBSIELLA PNEUMONIAE  MRSA Next Gen by PCR, Nasal     Status: None   Collection Time: 12/18/20 10:38 AM   Specimen: Nasal Mucosa; Nasal Swab  Result Value Ref Range Status   MRSA by PCR Next Gen NOT DETECTED NOT DETECTED Final    Comment: (NOTE) The GeneXpert MRSA Assay (FDA approved for NASAL specimens only), is one component of a comprehensive MRSA colonization surveillance program. It is not intended to diagnose MRSA infection nor to guide or monitor treatment for MRSA infections. Test performance is not FDA approved in patients less than 73 years old. Performed at [facility], [address], [place], [place] [address]   Culture, blood (Routine X 2) w Reflex to ID Panel     Status: None   Collection Time: 12/18/20 11:27 AM   Specimen: BLOOD RIGHT ARM  Result Value Ref Range Status   Specimen Description BLOOD RIGHT ARM  Final   Special Requests   Final    BOTTLES DRAWN AEROBIC ONLY Blood Culture adequate volume   Culture   Final    NO GROWTH 5 DAYS Performed at [facility], [address] [place], [place] [address]    Report Status 12/23/2020 FINAL  Final  Culture, blood (Routine X 2) w Reflex to ID Panel     Status: None   Collection  Time: 12/18/20 11:30 AM   Specimen: BLOOD RIGHT HAND  Result Value Ref Range Status   Specimen Description BLOOD RIGHT HAND  Final   Special Requests   Final    BOTTLES DRAWN AEROBIC ONLY Blood Culture results may not be optimal due to an inadequate volume of blood received in culture bottles   Culture   Final    NO GROWTH 5 DAYS Performed at [facility], [address] [place], [place] [address]    Report Status 12/23/2020 FINAL  Final         Radiology Studies: DG CHEST PORT 1 VIEW  Result Date: 12/24/2020 CLINICAL DATA:  70 year old male with shortness of breath. EXAM: PORTABLE CHEST 1 VIEW COMPARISON:  Portable chest 12/17/2020 and earlier. FINDINGS: Improved lung volumes and left lung base ventilation with mild residual streaky opacity there, which is increased compared to April this year. Enteric feeding tube in place, courses to the abdomen with tip not included. Mediastinal contours remain normal. Prior sternotomy. No pneumothorax or pulmonary edema. Continued blunting of the left costophrenic angle suggestive of pleural effusion. The contralateral visible right angle appears to remain clear. Paucity of bowel gas in the upper abdomen. IMPRESSION: 1. Enteric feeding tube in place courses to the abdomen, tip not included. 2. Suspect continued small left pleural effusion. Streaky opacity at the left lung base has improved but not resolved, favor atelectasis. 3. No new cardiopulmonary abnormality. Electronically Signed   By: Odessa Fleming M.D.   On: 12/24/2020 09:37        Scheduled Meds:  apixaban  10 mg Per Tube BID   Followed by   Melene Muller ON 12/29/2020] apixaban  5 mg Per Tube BID   arformoterol  15 mcg Nebulization BID   aspirin  81 mg Per Tube Daily   atorvastatin  80 mg Per Tube Daily   budesonide (PULMICORT) nebulizer solution  0.5 mg Nebulization BID   chlorhexidine  15 mL Mouth Rinse BID   doxazosin  2 mg Per Tube Daily   ezetimibe  10 mg Per Tube Daily   feeding supplement (PROSource TF)  45 mL Per Tube TID   free water  200 mL Per Tube Q4H   gabapentin  300 mg Per Tube BID   insulin aspart  0-15 Units Subcutaneous Q4H   insulin aspart  8 Units Subcutaneous Q4H   insulin glargine  15 Units Subcutaneous BID   ipratropium-albuterol  3 mL Nebulization TID   mouth rinse  15 mL Mouth Rinse q12n4p   metoprolol tartrate  25 mg Per Tube BID    montelukast  10 mg Per Tube QHS   pantoprazole sodium  40 mg Per Tube QHS   sodium chloride flush  3 mL Intravenous Q12H   Continuous Infusions:  sodium chloride     feeding supplement (JEVITY 1.5 CAL/FIBER) 1,000 mL (12/24/20 1100)     LOS: 21 days    Time spent:   Marcellus Scott, MD, [place], [facility]. [facility]  To contact the attending provider between 7A-7P or the covering provider during after hours 7P-7A, please log into the web site [email or web address] and access using universal [place] password for that web site. If you do not have the password, please call the hospital operator.

## 2020-12-24 NOTE — TOC Progression Note (Signed)
Transition of Care [facility]) - Progression Note    Patient Details  Name: Brandon Daniel MRN: [id number] Date of Birth: 1950-07-10  Transition of Care [facility]) CM/SW Contact  Kermit Balo, RN Phone Number: 12/24/2020, 2:30 PM  Clinical Narrative:    CM spoke to utilization person through [facility], [place] that informed CM that they do not cover LTACH outside of [place]. They would cover a CIR/ SNF stay but no HH services either.  CM has updated the MD, [employer], Kandis Mannan (nephew). Ellis Savage is suggesting that if the patient is to remain in [place] that his medicare be changed to a different managed care. Omar updated.  TOC following.   Expected Discharge Plan: Long Term Acute Care (LTAC) Barriers to Discharge: Continued Medical Work up  Expected Discharge Plan and Services Expected Discharge Plan: Long Term Acute Care (LTAC) In-house Referral: Clinical Social Work   Post Acute Care Choice: Long Term Acute Care (LTAC) Living arrangements for the past 2 months: Single Family Home                                       Social Determinants of Health (SDOH) Interventions    Readmission Risk Interventions No flowsheet data found.

## 2020-12-24 NOTE — Progress Notes (Signed)
Nutrition Follow-up  DOCUMENTATION CODES:   Obesity unspecified  INTERVENTION:  -Continue feeding assistance/full supervision with meals -Magic cup TID with meals, each supplement provides 290 kcal and 9 grams of protein -Switch to inappropriate for room service given pt is nonverbal -Continue TF via Cortrak:  Jevity 1.5 @ 71m/hr (15618m 4547mrosource TF TID 200m73mee water Q4H  Provides 2460kcals, 132g protein, 1185ml57me water (2385ml 73ml free water with flushes)   NUTRITION DIAGNOSIS:   Inadequate oral intake related to inability to eat as evidenced by NPO status.  ongoing  GOAL:   Patient will meet greater than or equal to 90% of their needs  Met with TF  MONITOR:   Diet advancement, Labs, Weight trends, TF tolerance, I & O's  REASON FOR ASSESSMENT:   Ventilator, Consult Enteral/tube feeding initiation and management  ASSESSMENT:   69 yea78old male who presented to the ED on 6/09 with SOB. 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 Pt found to have NSTEMI and influenza A. Pt developed R facial droop, R arm drift and hand grip weakness along with R leg weakness and pt likely with CVA s/p tPA.  6/11 - intubated 6/14 - s/p LHC 6/17 - extubated, Cortrak placed (tip gastric)  Per TOC team, LTAC have started insurance authorization process.   Pt is nonverbal and does not follow commands, but does track activity with eyes.   Pt was cleared for a dysphagia 1 diet with honey thickened liquids on 6/28; however, tube feeding should be continued until pt can consistently meet his nutrition needs po. If pt is unable to do so, recommend placement of PEG tube. So far, only one meal has been documented since diet advancement, 25% meal completion noted. Current TF orders: vital 1.5 @ 65ml/h90m5ml pr32mrce TF TID, 200m free42mer Q4H. Discussed pt with RN. Pt tolerating tube feeding well, but does have a new pressure injury at the site of the bridle. Please  see Cortrak team note for further details on that matter.   Medications: SSI, lantus, protonix Labs: Cr 0.59 (L, trending down) CBGs 95-172-155  Admission weight: 99.2 kg Current weight: 97.1 kg  UOP: 3x unmeasured occurrences x24 hours Stool: 1L + 4x unmeasured occurrences x24 hours I/O: +1656ml sinc61mmit  Diet Order:   Diet Order             Diet NPO time specified  Diet effective now                   EDUCATION NEEDS:   No education needs have been identified at this time  Skin:  Skin Assessment: Reviewed RN Assessment  Last BM:  12/13/20  Height:   Ht Readings from Last 1 Encounters:  12/05/20 5' 9.02" (1.753 m)    Weight:   Wt Readings from Last 1 Encounters:  12/24/20 97.1 kg    BMI:  Body mass index is 31.6 kg/m.  Estimated Nutritional Needs:   Kcal:  2300-2500 [phone]  120-140 grams  Fluid:  >/= 2.0 L    Jeter Tomey AveLarkin InaLDN (she/her/hers) RD pager number and weekend/on-call pager number located in [email or web address]

## 2020-12-25 DIAGNOSIS — R197 Diarrhea, unspecified: Secondary | ICD-10-CM

## 2020-12-25 LAB — GLUCOSE, CAPILLARY
Glucose-Capillary: 137 mg/dL — ABNORMAL HIGH (ref 70–99)
Glucose-Capillary: 165 mg/dL — ABNORMAL HIGH (ref 70–99)
Glucose-Capillary: 165 mg/dL — ABNORMAL HIGH (ref 70–99)
Glucose-Capillary: 180 mg/dL — ABNORMAL HIGH (ref 70–99)
Glucose-Capillary: 197 mg/dL — ABNORMAL HIGH (ref 70–99)
Glucose-Capillary: 243 mg/dL — ABNORMAL HIGH (ref 70–99)
Glucose-Capillary: 93 mg/dL (ref 70–99)

## 2020-12-25 MED ORDER — TRAMADOL HCL 50 MG PO TABS
50.0000 mg | ORAL_TABLET | Freq: Once | ORAL | Status: AC
  Administered 2020-12-25: 50 mg via ORAL
  Filled 2020-12-25: qty 1

## 2020-12-25 MED ORDER — LOPERAMIDE HCL 2 MG PO CAPS
2.0000 mg | ORAL_CAPSULE | Freq: Once | ORAL | Status: AC
  Administered 2020-12-25: 2 mg via ORAL
  Filled 2020-12-25: qty 1

## 2020-12-25 NOTE — Progress Notes (Signed)
Physical Therapy Treatment Patient Details Name: Brandon Daniel MRN: [id number] DOB: [birth year] Today's Date: 12/25/2020    History of Present Illness [name] is a 70 y.o. male admitted 6/9 that  presented with SOB, was found to be Influenza A + and later on day of admission developed mild R facial droop, R arm drift and hand grip weakness along with R leg weakness. Left MCA and ACA CVA. Pt received tPa. VDRF 6/11-6/15. PMH: CAD, HTN, lumbar stenosis, HLD    PT Comments    Patient continues to be limited by poor activity tolerance. Patient requires maxA+2 for bed mobility. Able to follow 75% of simple one step commands but unable to follow 2 step commands. Performed lateral propping on elbow to promote weightbearing through UE. Patient required up to totalA to maintain sitting balance at times. Continues to push with L UE when given opportunity. Continue to recommend SNF for ongoing Physical Therapy as patient would be unable to tolerate CIR level therapies at this time.    Follow Up Recommendations  SNF     Equipment Recommendations  Other (comment) (TBD)    Recommendations for Other Services       Precautions / Restrictions Precautions Precautions: Fall;Other (comment) Precaution Comments: cortrak, R hemi Restrictions Weight Bearing Restrictions: No    Mobility  Bed Mobility Overal bed mobility: Needs Assistance Bed Mobility: Supine to Sit;Sit to Supine     Supine to sit: +2 for physical assistance;+2 for safety/equipment;Max assist Sit to supine: Max assist;+2 for physical assistance;+2 for safety/equipment   General bed mobility comments: patient able to initiate bringing LEs towards EOB but overall required maxA+2 for bed mobility    Transfers                 General transfer comment: deferred due to increased fatigue after seated exercises  Ambulation/Gait                 Stairs             Wheelchair Mobility    Modified Rankin  (Stroke Patients Only) Modified Rankin (Stroke Patients Only) Pre-Morbid Rankin Score: No symptoms Modified Rankin: Severe disability     Balance Overall balance assessment: Needs assistance Sitting-balance support: No upper extremity supported;Single extremity supported;Feet supported Sitting balance-Leahy Scale: Poor Sitting balance - Comments: required minA-totalA to sit EOB. Improved sitting balance with L UE in lap Postural control: Posterior lean;Right lateral lean;Left lateral lean                                  Cognition Arousal/Alertness: Awake/alert Behavior During Therapy: Flat affect Overall Cognitive Status: Impaired/Different from baseline Area of Impairment: Attention;Following commands;Problem solving                   Current Attention Level: Focused   Following Commands: Follows one step commands with increased time     Problem Solving: Decreased initiation;Requires verbal cues;Requires tactile cues General Comments: following 75% of simple one step commands but unable to follow any 2 step commands this session. Shaking head yes/no to questions and attempting to make needs known with gestures.      Exercises Other Exercises Other Exercises: Lateral lean L/R to promote WBing through R UE x 6 each side Other Exercises: reaching tasks outside of BOS with L UE    General Comments        Pertinent Vitals/Pain Pain Assessment: Faces  Faces Pain Scale: No hurt Pain Intervention(s): Monitored during session    Home Living                      Prior Function            PT Goals (current goals can now be found in the care plan section) Acute Rehab PT Goals Patient Stated Goal: not stated PT Goal Formulation: With family Time For Goal Achievement: 01/06/21 Potential to Achieve Goals: Fair Progress towards PT goals: Progressing toward goals    Frequency    Min 3X/week      PT Plan Current plan remains appropriate     Co-evaluation              AM-PAC PT "6 Clicks" Mobility   Outcome Measure  Help needed turning from your back to your side while in a flat bed without using bedrails?: A Lot Help needed moving from lying on your back to sitting on the side of a flat bed without using bedrails?: Total Help needed moving to and from a bed to a chair (including a wheelchair)?: Total Help needed standing up from a chair using your arms (e.g., wheelchair or bedside chair)?: Total Help needed to walk in hospital room?: Total Help needed climbing 3-5 steps with a railing? : Total 6 Click Score: 7    End of Session Equipment Utilized During Treatment: Oxygen Activity Tolerance: Patient limited by fatigue Patient left: in bed;with call bell/phone within reach;with bed alarm set Nurse Communication: Mobility status;Need for lift equipment PT Visit Diagnosis: Muscle weakness (generalized) (M62.81);Hemiplegia and hemiparesis Hemiplegia - Right/Left: Right Hemiplegia - dominant/non-dominant: Dominant Hemiplegia - caused by: Cerebral infarction     Time: 6812-7517 PT Time Calculation (min) (ACUTE ONLY): 23 min  Charges:  $Therapeutic Activity: 23-37 mins                     Brandon Daniel A. Dan Humphreys PT, DPT Acute Rehabilitation Services Pager [phone] Office [phone]    Brandon Daniel 12/25/2020, 5:14 PM

## 2020-12-25 NOTE — Progress Notes (Addendum)
PROGRESS NOTE    Brandon Daniel  GMW:[id number] DOB: 12-Dec-1950 DOA: 12/03/2020 PCP: Pcp, No    Brief Narrative:  70 year old male with a history of coronary artery disease, hypertension, hyperlipidemia, admitted to the hospital with shortness of breath.  He was found to be influenza positive, as well as having an elevated troponin.  The day after admission, he was noted to have acute change in neuro status with acute right-sided weakness.  He was found to have left MCA/ACA territory infarct.  Seen by neurology and received tPA.  On 6/10, he had worsening respiratory status requiring intubation.  He was noted to have bilateral lower lobe pneumonia. He was treated with antibiotics and lasix. Subsequently extubated on 6/17. Transferred to [facility] on 6/22. He had recurrence of fever and was found to have Klebsiella ESBL UTI and staph pneumonia. He is currently on Meropenem. He has significant dysphagia and currently receiving nutrition through cortrak. He was also noted to have RLE DVT and started on DOAC. Disposition would be to CIR vs. SNF vs. LTACH (his current insurance reportedly does not cover).  SLP cleared him for dysphagia 1 diet and honey thickened liquids on 6/28, will continue tube feeds until oral intake satisfactory and if not may need to consider PEG tube placement.   Assessment & Plan:   Principal Problem:   Influenza A Active Problems:   NSTEMI (non-ST elevated myocardial infarction) (HCC)   CAD (coronary artery disease)   Asthma   Essential hypertension   Hyperlipidemia   Dyspnea   Pleural effusion on left   Stroke (cerebrum) (HCC)   Acute respiratory failure with hypoxia (HCC)   Pressure injury of skin   Left MCA/ACA territory stroke -he has dysphagia, aphasia and right hemiplegia -Received tPA on 6/9 secondary to symptomatic high-grade proximal left carotid stenosis -Neurology evaluated patient and appear to have signed off. -Currently on antiplatelet therapy with  aspirin -he is also on DOAC for 3 months for DVT -once DOAC course is complete, he will need to be on DAPT with asa and plavix -LDL 55, A1c 6.2 -etiology of stroke likely large vessel atherosclerosis -Outpatient follow-up with neurology  Symptomatic severe left internal carotid artery stenosis -Seen by neuro interventional radiology who recommended outpatient follow-up for ICA stenting if patient makes meaningful neurologic improvement  Non-STEMI -Patient noted to have new ST depressions in V6 and worsening T wave inversions in inferior leads -Seen by cardiology and underwent left heart cath on 6/14 that showed multivessel disease. -Recommendations were for medical management for now -He will need to follow-up with cardiology to be considered for further therapy, provided he makes further neurologic recovery  Acute respiratory failure with hypoxia -Secondary to pneumonia/left pleural effusion/increased respiratory secretions -He was previously treated with antivirals for influenza as well as completed a course of antibiotics -Repeat findings indicate possible new staph pneumonia -Continue chest physical therapy -Extubated 6/17.  Discussed with RT regarding weaning off oxygen.  Now down to 1 L/min and saturating in the mid 90s and should be able to wean to room air.  Does not sound wet with secretions. -he is at high risk for recurrent aspiration -Repeat chest x-ray 6/30 without acute findings.  ESBL Klebsiella UTI -Completed course of antibiotics including 2 days of cefepime followed by 3 days of meropenem and finally a dose of fosfomycin on 6/29.  MSSA pneumonia Completed course of antibiotics including 2 days of cefepime, 3 days of vancomycin, 3 days of meropenem.  RLE peroneal DVT -venous dopplers show  DVT in RLE, below the knee that is age indeterminate -patient does not have any known prior history of DVT  -Previous hospitalist MD discussed with Dr. Vassie Loll, PCCM and it was felt  reasonable to hold anticoagulation, monitor for now and repeat venous dopplers in 3-5 days -venous dopplers repeated on 6/28 that showed persistent DVT -since patient is immobile, he would be considered higher risk extension of DVT -will start on DOAC for 3 months -Previous hospitalist MD discussed with neurology Dr. Roda Shutters, and it was recommended to discontinue plavix while on anticoagulation. He should continue on aspirin -once his anticoagulation course is complete, he should return to DAPT   AKI Resolved.  Type 2 diabetes -A1c of 6.2 -Continue sliding scale insulin and Lantus -blood sugars mildly uncontrolled and fluctuating.  No change in regimen.  Dysphagia -Related to stroke -Currently has cortrak for tube feeding.  This was repositioned on 6/30 due to nasal excoriation and has now improved. -may need to consider PEG tube if patient will be going to SNF, since SNF would not accept him with cortrak - Remains on dysphagia 1 diet and honey thickened liquids.  ST discussed extensively with RN regarding feeding techniques.  As per RN, oral intake improving.  Urinary retention Initiated on Cardura.  Resolved.  Hypertension -stable on metoprolol  Goals of care -seen by palliative care. Family has elected to continue with full scope of treatment  Diarrhea Has a rectal tube.  May be related to tube feeds versus other etiologies.  Low index of suspicion for C. difficile.  Per nursing has put out 200 mL since this morning.  Trial of a dose of Imodium.  Nonsustained SVT/VT: 2D echo 12/04/2020: LVEF 55-60%.  Check TSH: 0.833.  Potassium is greater than 4.  Magnesium 2.1.  Continue telemetry.  No further episodes of VT.  Occasional SVTs.  Abnormal LFTs: Mildly elevated AST and ALT, stable for the last 3 days.  Follow periodically.  Unclear etiology.  No prior numbers to compare.   DVT prophylaxis: eliquis apixaban (ELIQUIS) tablet 10 mg  apixaban (ELIQUIS) tablet 5 mg  Code Status:  full code Family Communication: I discussed in detail with 1 of patient's nephews, updated care and answered all questions. Disposition Plan: Status is: Inpatient  Remains inpatient appropriate because:Ongoing diagnostic testing needed not appropriate for outpatient work up and Inpatient level of care appropriate due to severity of illness.  As per discussion with TOC team this morning, LTAC was declined by insurance.  Dispo: The patient is from: Home              Anticipated d/c is to:  CIR  vs. SNF vs. LTACH              Patient currently is not medically stable to d/c.   Difficult to place patient No    Consultants:  Neurology (signed off) PCCM (signed off) Cardiology (signed off) Palliative care medicine.  Procedures:  ETT 6/10>6/17 LHC 6/14  Antimicrobials:  Tamiflu completed 6/13 Azithromycin 6/11 > 6/14 Ceftriaxone 6/11 > 6/15  Vancomycin 6/24> 6/26 Cefepime 6/24 > 6/26 Meropenem 6/26>   Subjective: Patient having chest vest therapy and nebs this morning per RT.  Waving left hand and indicated no complaints.  Nods to questions.  Objective: Vitals:   12/25/20 0901 12/25/20 1146 12/25/20 1428 12/25/20 1623  BP:  (!) 119/57  128/63  Pulse:  92  90  Resp:  (!) 21  20  Temp:  98 F (36.7 C)  98.5 F (36.9 C)  TempSrc:  Axillary  Axillary  SpO2: 96% 96% 96% 95%  Weight:      Height:        Intake/Output Summary (Last 24 hours) at 12/25/2020 1651 Last data filed at 12/25/2020 0349 Gross per 24 hour  Intake 3 ml  Output 1200 ml  Net -1197 ml   Filed Weights   12/23/20 0445 12/24/20 0438 12/25/20 0500  Weight: 98.7 kg 97.1 kg 95.4 kg    Examination:  General exam: Middle-age male, moderately built and nourished lying comfortably in bed Respiratory system: Minimally diminished breath sounds in the bases but otherwise clear to auscultation.  No increased work of breathing.  Stable without change Cardiovascular system: S1 and S2 heard, RRR.  No JVD, murmurs or  pedal edema.  Telemetry personally reviewed: Sinus rhythm.  An episode of nonsustained SVT at 5:43 PM on 6/30. Gastrointestinal system: Abdomen is nondistended, soft and nontender. No organomegaly or masses felt. Normal bowel sounds heard. Central nervous system: Alert and seems oriented.  Nods head to question.  Waves with left hand.  Follows instructions.  Dense right hemiplegia i.e. 0/5 power.  Normal power left limbs.   Extremities: No C/C/E, +pedal pulses.  Has a right heel floater. Skin: No rashes, lesions or ulcers Psychiatry: unable to assess.  Flat affect. ENT: Right nasal core track.  Mild excoriation of inferior and medial aspect of right nostril but no bleeding, better.  Some crusting.    Data Reviewed: I have personally reviewed following labs and imaging studies  CBC: Recent Labs  Lab 12/19/20 0416 12/20/20 0249 12/21/20 0350 12/23/20 0259  WBC 8.4 6.9 4.9 5.8  HGB 9.5* 9.3* 9.5* 10.2*  HCT 33.7* 31.2* 32.5* 33.5*  MCV 95.7 94.3 93.9 91.0  PLT 440* 428* 382 345   Basic Metabolic Panel: Recent Labs  Lab 12/19/20 0416 12/20/20 0249 12/21/20 0350 12/22/20 0416 12/23/20 0259 12/23/20 1245  NA 145 148* 144 142 141  --   K 3.7 4.5 3.8 4.0 4.2  --   CL 106 113* 110 106 106  --   CO2 31 29 29 30 28   --   GLUCOSE 220* 198* 199* 144* 220*  --   BUN 49* 46* 33* 30* 28*  --   CREATININE 0.89 0.84 0.68 0.64 0.59*  --   CALCIUM 8.4* 8.2* 8.1* 8.5* 8.4*  --   MG  --   --   --   --   --  2.1  PHOS 3.6  --   --   --   --   --    GFR: Estimated Creatinine Clearance: 102.1 mL/min (A) (by C-G formula based on SCr of 0.59 mg/dL (L)). Liver Function Tests: Recent Labs  Lab 12/19/20 0416 12/21/20 0350 12/22/20 0416 12/23/20 0259  AST  --  93* 101* 99*  ALT  --  100* 123* 130*  ALKPHOS  --  71 68 69  BILITOT  --  0.5 0.3 0.3  PROT  --  5.9* 6.3* 6.1*  ALBUMIN 1.9* 1.7* 1.9* 2.0*    CBG: Recent Labs  Lab 12/25/20 0207 12/25/20 0509 12/25/20 0843 12/25/20 1231  12/25/20 1626  GLUCAP 180* 137* 165* 243* 93    Recent Results (from the past 240 hour(s))  Culture, Respiratory w Gram Stain     Status: None   Collection Time: 12/17/20  5:58 PM   Specimen: Tracheal Aspirate; Respiratory  Result Value Ref Range Status   Specimen Description TRACHEAL  ASPIRATE  Final   Special Requests NONE  Final   Gram Stain   Final    ABUNDANT WBC PRESENT,BOTH PMN AND MONONUCLEAR ABUNDANT GRAM POSITIVE COCCI IN PAIRS RARE GRAM POSITIVE RODS Performed at [facility], [address] [place], [place] [address]    Culture ABUNDANT STAPHYLOCOCCUS AUREUS  Final   Report Status 12/20/2020 FINAL  Final   Organism ID, Bacteria STAPHYLOCOCCUS AUREUS  Final      Susceptibility   Staphylococcus aureus - MIC*    CIPROFLOXACIN <=0.5 SENSITIVE Sensitive     ERYTHROMYCIN >=8 RESISTANT Resistant     GENTAMICIN <=0.5 SENSITIVE Sensitive     OXACILLIN 0.5 SENSITIVE Sensitive     TETRACYCLINE <=1 SENSITIVE Sensitive     VANCOMYCIN <=0.5 SENSITIVE Sensitive     TRIMETH/SULFA <=10 SENSITIVE Sensitive     CLINDAMYCIN RESISTANT Resistant     RIFAMPIN <=0.5 SENSITIVE Sensitive     Inducible Clindamycin POSITIVE Resistant     * ABUNDANT STAPHYLOCOCCUS AUREUS  Culture, Urine     Status: Abnormal   Collection Time: 12/18/20  8:29 AM   Specimen: Urine, Random  Result Value Ref Range Status   Specimen Description URINE, RANDOM  Final   Special Requests   Final    NONE Performed at [facility], [address], [place], [place] [address]    Culture (A)  Final    >=100,000 COLONIES/mL KLEBSIELLA PNEUMONIAE Confirmed Extended Spectrum Beta-Lactamase Producer (ESBL).  In bloodstream infections from ESBL organisms, carbapenems are preferred over piperacillin/tazobactam. They are shown to have a lower risk of mortality.    Report Status 12/20/2020 FINAL  Final   Organism ID, Bacteria KLEBSIELLA PNEUMONIAE (A)  Final      Susceptibility   Klebsiella pneumoniae - MIC*     AMPICILLIN >=32 RESISTANT Resistant     CEFAZOLIN >=64 RESISTANT Resistant     CEFEPIME 2 SENSITIVE Sensitive     CEFTRIAXONE >=64 RESISTANT Resistant     CIPROFLOXACIN 0.5 SENSITIVE Sensitive     GENTAMICIN <=1 SENSITIVE Sensitive     IMIPENEM <=0.25 SENSITIVE Sensitive     NITROFURANTOIN 64 INTERMEDIATE Intermediate     TRIMETH/SULFA >=320 RESISTANT Resistant     AMPICILLIN/SULBACTAM 8 SENSITIVE Sensitive     PIP/TAZO <=4 SENSITIVE Sensitive     * >=100,000 COLONIES/mL KLEBSIELLA PNEUMONIAE  MRSA Next Gen by PCR, Nasal     Status: None   Collection Time: 12/18/20 10:38 AM   Specimen: Nasal Mucosa; Nasal Swab  Result Value Ref Range Status   MRSA by PCR Next Gen NOT DETECTED NOT DETECTED Final    Comment: (NOTE) The GeneXpert MRSA Assay (FDA approved for NASAL specimens only), is one component of a comprehensive MRSA colonization surveillance program. It is not intended to diagnose MRSA infection nor to guide or monitor treatment for MRSA infections. Test performance is not FDA approved in patients less than 9 years old. Performed at [facility], [address] [place], [place] [address]   Culture, blood (Routine X 2) w Reflex to ID Panel     Status: None   Collection Time: 12/18/20 11:27 AM   Specimen: BLOOD RIGHT ARM  Result Value Ref Range Status   Specimen Description BLOOD RIGHT ARM  Final   Special Requests   Final    BOTTLES DRAWN AEROBIC ONLY Blood Culture adequate volume   Culture   Final    NO GROWTH 5 DAYS Performed at [facility], [address] [place], [place] [address]    Report Status 12/23/2020 FINAL  Final  Culture, blood (Routine X 2) w Reflex to ID Panel     Status: None   Collection Time: 12/18/20 11:30 AM   Specimen: BLOOD RIGHT HAND  Result Value Ref Range Status   Specimen Description BLOOD RIGHT HAND  Final   Special Requests   Final    BOTTLES DRAWN AEROBIC ONLY Blood Culture results may not be optimal due to an inadequate  volume of blood received in culture bottles   Culture   Final    NO GROWTH 5 DAYS Performed at [facility], [address] [place], [place] [address]    Report Status 12/23/2020 FINAL  Final         Radiology Studies: DG CHEST PORT 1 VIEW  Result Date: 12/24/2020 CLINICAL DATA:  70 year old male with shortness of breath. EXAM: PORTABLE CHEST 1 VIEW COMPARISON:  Portable chest 12/17/2020 and earlier. FINDINGS: Improved lung volumes and left lung base ventilation with mild residual streaky opacity there, which is increased compared to April this year. Enteric feeding tube in place, courses to the abdomen with tip not included. Mediastinal contours remain normal. Prior sternotomy. No pneumothorax or pulmonary edema. Continued blunting of the left costophrenic angle suggestive of pleural effusion. The contralateral visible right angle appears to remain clear. Paucity of bowel gas in the upper abdomen. IMPRESSION: 1. Enteric feeding tube in place courses to the abdomen, tip not included. 2. Suspect continued small left pleural effusion. Streaky opacity at the left lung base has improved but not resolved, favor atelectasis. 3. No new cardiopulmonary abnormality. Electronically Signed   By: Odessa FlemingH  Hall M.D.   On: 12/24/2020 09:37        Scheduled Meds:  apixaban  10 mg Per Tube BID   Followed by   Melene Muller[START ON 12/29/2020] apixaban  5 mg Per Tube BID   arformoterol  15 mcg Nebulization BID   aspirin  81 mg Per Tube Daily   atorvastatin  80 mg Per Tube Daily   budesonide (PULMICORT) nebulizer solution  0.5 mg Nebulization BID   chlorhexidine  15 mL Mouth Rinse BID   doxazosin  2 mg Per Tube Daily   ezetimibe  10 mg Per Tube Daily   feeding supplement (PROSource TF)  45 mL Per Tube TID   free water  200 mL Per Tube Q4H   gabapentin  300 mg Per Tube BID   insulin aspart  0-15 Units Subcutaneous Q4H   insulin aspart  8 Units Subcutaneous Q4H   insulin glargine  15 Units Subcutaneous BID    ipratropium-albuterol  3 mL Nebulization TID   mouth rinse  15 mL Mouth Rinse q12n4p   metoprolol tartrate  25 mg Per Tube BID   montelukast  10 mg Per Tube QHS   pantoprazole sodium  40 mg Per Tube QHS   sodium chloride flush  3 mL Intravenous Q12H   Continuous Infusions:  sodium chloride     feeding supplement (JEVITY 1.5 CAL/FIBER) 1,000 mL (12/25/20 1536)     LOS: 22 days    Time spent: 35mins   Marcellus ScottAnand Romina Divirgilio, MD, [place]ACP, [facility]HM. [facility]  To contact the attending provider between 7A-7P or the covering provider during after hours 7P-7A, please log into the web site [email or web address] and access using universal [place] password for that web site. If you do not have the password, please call the hospital operator.

## 2020-12-26 DIAGNOSIS — E876 Hypokalemia: Secondary | ICD-10-CM

## 2020-12-26 DIAGNOSIS — R1310 Dysphagia, unspecified: Secondary | ICD-10-CM | POA: Diagnosis not present

## 2020-12-26 LAB — COMPREHENSIVE METABOLIC PANEL
ALT: 101 U/L — ABNORMAL HIGH (ref 0–44)
AST: 62 U/L — ABNORMAL HIGH (ref 15–41)
Albumin: 2.1 g/dL — ABNORMAL LOW (ref 3.5–5.0)
Alkaline Phosphatase: 75 U/L (ref 38–126)
Anion gap: 8 (ref 5–15)
BUN: 21 mg/dL (ref 8–23)
CO2: 28 mmol/L (ref 22–32)
Calcium: 8.2 mg/dL — ABNORMAL LOW (ref 8.9–10.3)
Chloride: 100 mmol/L (ref 98–111)
Creatinine, Ser: 0.5 mg/dL — ABNORMAL LOW (ref 0.61–1.24)
GFR, Estimated: >60 mL/min (ref >60)
Glucose, Bld: 116 mg/dL — ABNORMAL HIGH (ref 70–99)
Potassium: 3.4 mmol/L — ABNORMAL LOW (ref 3.5–5.1)
Sodium: 136 mmol/L (ref 135–145)
Total Bilirubin: 0.5 mg/dL (ref 0.3–1.2)
Total Protein: 6.1 g/dL — ABNORMAL LOW (ref 6.5–8.1)

## 2020-12-26 LAB — CBC
HCT: 32.2 % — ABNORMAL LOW (ref 39.0–52.0)
Hemoglobin: 10 g/dL — ABNORMAL LOW (ref 13.0–17.0)
MCH: 27.6 pg (ref 26.0–34.0)
MCHC: 31.1 g/dL (ref 30.0–36.0)
MCV: 89 fL (ref 80.0–100.0)
Platelets: 250 10*3/uL (ref 150–400)
RBC: 3.62 MIL/uL — ABNORMAL LOW (ref 4.22–5.81)
RDW: 15.2 % (ref 11.5–15.5)
WBC: 6.5 10*3/uL (ref 4.0–10.5)
nRBC: 0 % (ref 0.0–0.2)

## 2020-12-26 LAB — GLUCOSE, CAPILLARY
Glucose-Capillary: 187 mg/dL — ABNORMAL HIGH (ref 70–99)
Glucose-Capillary: 130 mg/dL — ABNORMAL HIGH (ref 70–99)
Glucose-Capillary: 175 mg/dL — ABNORMAL HIGH (ref 70–99)
Glucose-Capillary: 140 mg/dL — ABNORMAL HIGH (ref 70–99)
Glucose-Capillary: 167 mg/dL — ABNORMAL HIGH (ref 70–99)

## 2020-12-26 LAB — MAGNESIUM: Magnesium: 1.9 mg/dL (ref 1.7–2.4)

## 2020-12-26 MED ORDER — PANTOPRAZOLE SODIUM 40 MG PO PACK
40.0000 mg | PACK | Freq: Every day | ORAL | Status: DC
  Administered 2020-12-26 – 2020-12-31 (×6): 40 mg via ORAL
  Filled 2020-12-26 (×6): qty 20

## 2020-12-26 MED ORDER — POTASSIUM CHLORIDE 20 MEQ PO PACK
40.0000 meq | PACK | Freq: Once | ORAL | Status: AC
  Administered 2020-12-26: 40 meq via ORAL
  Filled 2020-12-26: qty 2

## 2020-12-26 MED ORDER — ATORVASTATIN CALCIUM 80 MG PO TABS
80.0000 mg | ORAL_TABLET | Freq: Every day | ORAL | Status: DC
  Administered 2020-12-26 – 2021-01-12 (×18): 80 mg via ORAL
  Filled 2020-12-26 (×18): qty 1

## 2020-12-26 MED ORDER — SENNOSIDES-DOCUSATE SODIUM 8.6-50 MG PO TABS: 1.0000 | ORAL_TABLET | Freq: Every evening | ORAL | Status: DC | PRN

## 2020-12-26 MED ORDER — INSULIN ASPART 100 UNIT/ML IJ SOLN: 0.0000 [IU] | Freq: Every day | INTRAMUSCULAR | Status: DC

## 2020-12-26 MED ORDER — INSULIN ASPART 100 UNIT/ML IJ SOLN
0.0000 [IU] | Freq: Three times a day (TID) | INTRAMUSCULAR | Status: DC
Start: 2020-12-26 — End: 2021-01-13
  Administered 2020-12-26: 1 [IU] via SUBCUTANEOUS
  Administered 2020-12-26 – 2020-12-28 (×5): 2 [IU] via SUBCUTANEOUS
  Administered 2020-12-28: 1 [IU] via SUBCUTANEOUS
  Administered 2020-12-28 – 2020-12-29 (×3): 2 [IU] via SUBCUTANEOUS
  Administered 2020-12-29: 1 [IU] via SUBCUTANEOUS
  Administered 2020-12-30: 2 [IU] via SUBCUTANEOUS
  Administered 2020-12-30: 1 [IU] via SUBCUTANEOUS
  Administered 2020-12-30: 2 [IU] via SUBCUTANEOUS
  Administered 2020-12-31 (×2): 1 [IU] via SUBCUTANEOUS
  Administered 2020-12-31: 2 [IU] via SUBCUTANEOUS
  Administered 2021-01-01: 1 [IU] via SUBCUTANEOUS
  Administered 2021-01-01 – 2021-01-02 (×3): 2 [IU] via SUBCUTANEOUS
  Administered 2021-01-02 – 2021-01-03 (×4): 1 [IU] via SUBCUTANEOUS
  Administered 2021-01-04 (×2): 2 [IU] via SUBCUTANEOUS
  Administered 2021-01-04 – 2021-01-05 (×2): 1 [IU] via SUBCUTANEOUS
  Administered 2021-01-05: 2 [IU] via SUBCUTANEOUS
  Administered 2021-01-05: 1 [IU] via SUBCUTANEOUS
  Administered 2021-01-06: 2 [IU] via SUBCUTANEOUS
  Administered 2021-01-06: 3 [IU] via SUBCUTANEOUS
  Administered 2021-01-07: 2 [IU] via SUBCUTANEOUS
  Administered 2021-01-07: 1 [IU] via SUBCUTANEOUS
  Administered 2021-01-07 – 2021-01-08 (×2): 2 [IU] via SUBCUTANEOUS
  Administered 2021-01-08 – 2021-01-09 (×3): 1 [IU] via SUBCUTANEOUS
  Administered 2021-01-09 – 2021-01-10 (×2): 2 [IU] via SUBCUTANEOUS
  Administered 2021-01-10 (×2): 1 [IU] via SUBCUTANEOUS
  Administered 2021-01-11: 2 [IU] via SUBCUTANEOUS
  Administered 2021-01-11: 1 [IU] via SUBCUTANEOUS
  Administered 2021-01-11 – 2021-01-12 (×4): 2 [IU] via SUBCUTANEOUS

## 2020-12-26 MED ORDER — APIXABAN 5 MG PO TABS
10.0000 mg | ORAL_TABLET | Freq: Two times a day (BID) | ORAL | Status: AC
  Administered 2020-12-26 – 2020-12-29 (×7): 10 mg via ORAL
  Filled 2020-12-26 (×7): qty 2

## 2020-12-26 MED ORDER — ACETAMINOPHEN 325 MG PO TABS
650.0000 mg | ORAL_TABLET | ORAL | Status: DC | PRN
Start: 2020-12-26 — End: 2021-01-13
  Administered 2020-12-28 – 2021-01-11 (×13): 650 mg via ORAL
  Filled 2020-12-26 (×13): qty 2

## 2020-12-26 MED ORDER — DOXAZOSIN MESYLATE 2 MG PO TABS
2.0000 mg | ORAL_TABLET | Freq: Every day | ORAL | Status: DC
  Administered 2020-12-26: 2 mg via ORAL
  Filled 2020-12-26: qty 1

## 2020-12-26 MED ORDER — APIXABAN 5 MG PO TABS
5.0000 mg | ORAL_TABLET | Freq: Two times a day (BID) | ORAL | Status: DC
  Administered 2020-12-29 – 2021-01-11 (×27): 5 mg via ORAL
  Filled 2020-12-26 (×28): qty 1

## 2020-12-26 MED ORDER — TAMSULOSIN HCL 0.4 MG PO CAPS
0.4000 mg | ORAL_CAPSULE | Freq: Every day | ORAL | Status: DC
  Administered 2020-12-26 – 2021-01-12 (×16): 0.4 mg via ORAL
  Filled 2020-12-26 (×18): qty 1

## 2020-12-26 MED ORDER — METOPROLOL TARTRATE 25 MG/10 ML ORAL SUSPENSION
25.0000 mg | Freq: Two times a day (BID) | ORAL | Status: DC
  Administered 2020-12-26 – 2021-01-01 (×13): 25 mg via ORAL
  Filled 2020-12-26 (×14): qty 10

## 2020-12-26 MED ORDER — GUAIFENESIN-DM 100-10 MG/5ML PO SYRP
5.0000 mL | ORAL_SOLUTION | ORAL | Status: DC | PRN
  Administered 2020-12-28: 5 mL via ORAL
  Filled 2020-12-26: qty 5

## 2020-12-26 MED ORDER — INSULIN GLARGINE 100 UNIT/ML ~~LOC~~ SOLN
5.0000 [IU] | Freq: Two times a day (BID) | SUBCUTANEOUS | Status: DC
  Administered 2020-12-26 – 2021-01-12 (×34): 5 [IU] via SUBCUTANEOUS
  Filled 2020-12-26 (×36): qty 0.05

## 2020-12-26 MED ORDER — ADULT MULTIVITAMIN W/MINERALS CH
1.0000 | ORAL_TABLET | Freq: Every day | ORAL | Status: DC
  Administered 2020-12-27 – 2021-01-12 (×17): 1 via ORAL
  Filled 2020-12-26 (×18): qty 1

## 2020-12-26 MED ORDER — ASPIRIN 81 MG PO CHEW
81.0000 mg | CHEWABLE_TABLET | Freq: Every day | ORAL | Status: DC
  Administered 2020-12-26 – 2021-01-11 (×17): 81 mg via ORAL
  Filled 2020-12-26 (×18): qty 1

## 2020-12-26 MED ORDER — MONTELUKAST SODIUM 10 MG PO TABS
10.0000 mg | ORAL_TABLET | Freq: Every day | ORAL | Status: DC
  Administered 2020-12-26 – 2021-01-11 (×17): 10 mg via ORAL
  Filled 2020-12-26 (×17): qty 1

## 2020-12-26 MED ORDER — GABAPENTIN 250 MG/5ML PO SOLN
300.0000 mg | Freq: Two times a day (BID) | ORAL | Status: DC
  Administered 2020-12-26 – 2021-01-01 (×13): 300 mg via ORAL
  Filled 2020-12-26 (×15): qty 6

## 2020-12-26 MED ORDER — SODIUM CHLORIDE 0.9 % IV SOLN: INTRAVENOUS | Status: DC

## 2020-12-26 MED ORDER — EZETIMIBE 10 MG PO TABS
10.0000 mg | ORAL_TABLET | Freq: Every day | ORAL | Status: DC
  Administered 2020-12-26 – 2021-01-12 (×18): 10 mg via ORAL
  Filled 2020-12-26 (×18): qty 1

## 2020-12-26 NOTE — Progress Notes (Signed)
PROGRESS NOTE    Brandon Daniel Console  ZOX:[id number] DOB: 1951/05/28 DOA: 12/03/2020 PCP: Pcp, No    Brief Narrative:  70 year old male with a history of coronary artery disease, hypertension, hyperlipidemia, admitted to the hospital with shortness of breath.  He was found to be influenza positive, as well as having an elevated troponin.  The day after admission, he was noted to have acute change in neuro status with acute right-sided weakness.  He was found to have left MCA/ACA territory infarct.  Seen by neurology and received tPA.  On 6/10, he had worsening respiratory status requiring intubation.  He was noted to have bilateral lower lobe pneumonia. He was treated with antibiotics and lasix. Subsequently extubated on 6/17. [facility] He had recurrence of fever and was found to have Klebsiella ESBL UTI and staph pneumonia. He is currently on Meropenem. He has significant dysphagia and currently receiving nutrition through cortrak. He was also noted to have RLE DVT and started on DOAC. Disposition would be to CIR vs. SNF vs. LTACH (his current insurance reportedly does not cover).  SLP cleared him for dysphagia 1 diet and honey thickened liquids on 6/28.  Core track fell out 7/2.  Trial of oral intake, calorie count to see if this meets his nutritional requirements, if not will need to discuss with patient/family regarding PEG tube.  Will monitor over the next 48 hours.   Assessment & Plan:   Principal Problem:   Influenza A Active Problems:   NSTEMI (non-ST elevated myocardial infarction) (HCC)   CAD (coronary artery disease)   Asthma   Essential hypertension   Hyperlipidemia   Dyspnea   Pleural effusion on left   Stroke (cerebrum) (HCC)   Acute respiratory failure with hypoxia (HCC)   Pressure injury of skin   Left MCA/ACA territory stroke -he has dysphagia, aphasia and right hemiplegia -Received tPA on 6/9 secondary to symptomatic high-grade proximal left carotid  stenosis -Neurology evaluated patient and appear to have signed off. 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 -Recommendations were for medical management for now -He will need to follow-up with cardiology to be considered for further therapy, provided he makes further neurologic recovery  Acute respiratory failure with hypoxia -Secondary to pneumonia/left pleural effusion/increased respiratory secretions -He was previously treated with antivirals for influenza as well as completed a course of antibiotics -Repeat findings indicate possible new staph pneumonia -Continue chest physical therapy -Extubated 6/17.  Can likely wean off oxygen, discussed with RN. -he is at high risk for recurrent aspiration -Repeat chest x-ray 6/30 without acute findings.  ESBL Klebsiella UTI -Completed course of antibiotics including 2 days of cefepime followed by 3 days of meropenem and finally a dose of fosfomycin on 6/29.  MSSA pneumonia Completed course of antibiotics including 2 days of cefepime, 3 days of vancomycin, 3 days of meropenem.  RLE peroneal DVT -venous dopplers show DVT in RLE, below the  knee that is age indeterminate -patient does not have any known prior history of DVT  -Previous hospitalist MD discussed with Dr. Vassie Loll, PCCM and it was felt reasonable to hold  anticoagulation, monitor for now and repeat venous dopplers in 3-5 days -venous dopplers repeated on 6/28 that showed persistent DVT -since patient is immobile, he would be considered higher risk extension of DVT -will start on DOAC for 3 months -Previous hospitalist MD discussed with neurology Dr. Roda Shutters, and it was recommended to discontinue plavix while on anticoagulation. He should continue on aspirin -once his anticoagulation course is complete, he should return to DAPT   AKI Resolved.  Type 2 diabetes -A1c of 6.2 -Continue sliding scale insulin and Lantus -blood sugars mildly uncontrolled and fluctuating.  No change in regimen.  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  ST discussed extensively with RN regarding feeding techniques.  As per RN, oral intake improving. - Core track fell out 7/2.  Trial of oral intake, calorie count to see if this meets his nutritional requirements, if not will need to discuss with patient/family regarding PEG tube.  Will monitor over the next 48 hours.  Urinary retention Recurrent.  Over the last 24 hours, has required in and out catheterization x2 (>1200 mL during first occurrence).  If has another in and out catheterization, will have to place Foley catheter and leave it for approximately 2 weeks until follow-up with urology.  Discontinued Cardura and started Flomax.  Hypertension -stable on metoprolol  Goals of care -seen by palliative care. Family has elected to continue with full scope of treatment  Diarrhea Has a rectal tube.  May be related to tube feeds versus other etiologies.  Low index of suspicion for C. difficile.  Give a dose of Imodium last evening.  Had approximately 200 mL stool output overnight.  Monitor over the course of today.  If improves now that tube feeds are off, may DC rectal tube.  Nonsustained SVT/VT: 2D echo 12/04/2020: LVEF 55-60%.  Check TSH: 0.833.  Potassium is greater than 4.   Magnesium 2.1.  Continue telemetry.  No further episodes of VT.  Occasional SVTs.  Abnormal LFTs: Mildly elevated AST and ALT, stable for the last 3 days.  Follow periodically.  Unclear etiology.  No prior numbers to compare.  Prediabetes: A1c 6.2.  Patient was on high doses of Lantus, mealtime NovoLog and SSI while on tube feeds.  Now that tube feeds are off, reduced all insulins due to risk of hypoglycemia.  Monitor closely  Hypokalemia: Replace and follow.   DVT prophylaxis: eliquis apixaban (ELIQUIS) tablet 10 mg  apixaban (ELIQUIS) tablet 5 mg  Code Status: full code Family Communication: I discussed in detail with 1 of patient's nephews on 7/1, updated care and answered all questions. Disposition Plan: Status is: Inpatient  Remains inpatient appropriate because:Ongoing diagnostic testing needed not appropriate for outpatient work up and Inpatient level of care appropriate due to severity of illness.  As per discussion with TOC team this morning, LTAC was declined by insurance.  Dispo: The patient is from: Home              Anticipated d/c is to:  CIR  vs. SNF vs. LTACH              Patient currently is not medically stable to d/c.   Difficult to place patient No    Consultants:  Neurology (signed off) PCCM (signed off) Cardiology (signed  off) Palliative care medicine.  Procedures:  ETT 6/10>6/17 LHC 6/14  Antimicrobials:  Tamiflu completed 6/13 Azithromycin 6/11 > 6/14 Ceftriaxone 6/11 > 6/15  Vancomycin 6/24> 6/26 Cefepime 6/24 > 6/26 Meropenem 6/26>   Subjective: Patient sitting up in bed, being fed breakfast by NT.  Patient also self-feeding with his left hand, drinking out of a cup.  Per NT, prefers to drink then eat but did do okay with oral intake.  Objective: Vitals:   12/26/20 0753 12/26/20 0917 12/26/20 1156 12/26/20 1227  BP: 110/72 128/68 (!) 93/57 114/60  Pulse: 90 (!) 103 80 86  Resp: 19  (!) 21 19  Temp:      TempSrc:      SpO2: 100%  99%  99%  Weight:      Height:        Intake/Output Summary (Last 24 hours) at 12/26/2020 1437 Last data filed at 12/26/2020 0120 Gross per 24 hour  Intake --  Output 425 ml  Net -425 ml   Filed Weights   12/23/20 0445 12/24/20 0438 12/25/20 0500  Weight: 98.7 kg 97.1 kg 95.4 kg    Examination:  General exam: Middle-age male, moderately built and nourished sitting up in bed having breakfast. Respiratory system: Clear to auscultation.  No increased work of breathing. Cardiovascular system: S1 and S2 heard, RRR.  No JVD, murmurs or pedal edema.  Telemetry personally reviewed: SR. Gastrointestinal system: Abdomen is nondistended, soft and nontender. No organomegaly or masses felt. Normal bowel sounds heard. Central nervous system: Alert and oriented-communicates with head nods and waving with left upper extremity.  Nods appropriately indicating yes or no.  Waves with left hand.  Follows instructions.  Dense right hemiplegia i.e. 0/5 power.  Normal power left limbs.   Extremities: No C/C/E, +pedal pulses.  Has a right heel floater. Skin: No rashes, lesions or ulcers Psychiatry: unable to assess.  Flat affect. ENT: No NG tube any longer.    Data Reviewed: I have personally reviewed following labs and imaging studies  CBC: Recent Labs  Lab 12/20/20 0249 12/21/20 0350 12/23/20 0259 12/26/20 0647  WBC 6.9 4.9 5.8 6.5  HGB 9.3* 9.5* 10.2* 10.0*  HCT 31.2* 32.5* 33.5* 32.2*  MCV 94.3 93.9 91.0 89.0  PLT 428* 382 345 250   Basic Metabolic Panel: Recent Labs  Lab 12/20/20 0249 12/21/20 0350 12/22/20 0416 12/23/20 0259 12/23/20 1245 12/26/20 0647  NA 148* 144 142 141  --  136  K 4.5 3.8 4.0 4.2  --  3.4*  CL 113* 110 106 106  --  100  CO2 --  28  GLUCOSE 198* 199* 144* 220*  --  116*  BUN 46* 33* 30* 28*  --  21  CREATININE 0.84 0.68 0.64 0.59*  --  0.50*  CALCIUM 8.2* 8.1* 8.5* 8.4*  --  8.2*  MG  --   --   --   --  2.1 1.9   GFR: Estimated Creatinine  Clearance: 102.1 mL/min (A) (by C-G formula based on SCr of 0.5 mg/dL (L)). Liver Function Tests: Recent Labs  Lab 12/21/20 0350 12/22/20 0416 12/23/20 0259 12/26/20 0647  AST 93* 101* 99* 62*  ALT 100* 123* 130* 101*  ALKPHOS 71 68 69 75  BILITOT 0.5 0.3 0.3 0.5  PROT 5.9* 6.3* 6.1* 6.1*  ALBUMIN 1.7* 1.9* 2.0* 2.1*    CBG: Recent Labs  Lab 12/25/20 2017 12/25/20 2346 12/26/20 0425 12/26/20 0757 12/26/20 1154  GLUCAP 197*  165* 187* 130* 175*    Recent Results (from the past 240 hour(s))  Culture, Respiratory w Gram Stain     Status: None   Collection Time: 12/17/20  5:58 PM   Specimen: Tracheal Aspirate; Respiratory  Result Value Ref Range Status   Specimen Description TRACHEAL ASPIRATE  Final   Special Requests NONE  Final   Gram Stain   Final    ABUNDANT WBC PRESENT,BOTH PMN AND MONONUCLEAR ABUNDANT GRAM POSITIVE COCCI IN PAIRS RARE GRAM POSITIVE RODS Performed at [facility], [address], [place], [place] [address]    Culture ABUNDANT STAPHYLOCOCCUS AUREUS  Final   Report Status 12/20/2020 FINAL  Final   Organism ID, Bacteria STAPHYLOCOCCUS AUREUS  Final      Susceptibility   Staphylococcus aureus - MIC*    CIPROFLOXACIN <=0.5 SENSITIVE Sensitive     ERYTHROMYCIN >=8 RESISTANT Resistant     GENTAMICIN <=0.5 SENSITIVE Sensitive     OXACILLIN 0.5 SENSITIVE Sensitive     TETRACYCLINE <=1 SENSITIVE Sensitive     VANCOMYCIN <=0.5 SENSITIVE Sensitive     TRIMETH/SULFA <=10 SENSITIVE Sensitive     CLINDAMYCIN RESISTANT Resistant     RIFAMPIN <=0.5 SENSITIVE Sensitive     Inducible Clindamycin POSITIVE Resistant     * ABUNDANT STAPHYLOCOCCUS AUREUS  Culture, Urine     Status: Abnormal   Collection Time: 12/18/20  8:29 AM   Specimen: Urine, Random  Result Value Ref Range Status   Specimen Description URINE, RANDOM  Final   Special Requests   Final    NONE Performed at [facility], [address], [place], [place] [address]    Culture (A)   Final    >=100,000 COLONIES/mL KLEBSIELLA PNEUMONIAE Confirmed Extended Spectrum Beta-Lactamase Producer (ESBL).  In bloodstream infections from ESBL organisms, carbapenems are preferred over piperacillin/tazobactam. They are shown to have a lower risk of mortality.    Report Status 12/20/2020 FINAL  Final   Organism ID, Bacteria KLEBSIELLA PNEUMONIAE (A)  Final      Susceptibility   Klebsiella pneumoniae - MIC*    AMPICILLIN >=32 RESISTANT Resistant     CEFAZOLIN >=64 RESISTANT Resistant     CEFEPIME 2 SENSITIVE Sensitive     CEFTRIAXONE >=64 RESISTANT Resistant     CIPROFLOXACIN 0.5 SENSITIVE Sensitive     GENTAMICIN <=1 SENSITIVE Sensitive     IMIPENEM <=0.25 SENSITIVE Sensitive     NITROFURANTOIN 64 INTERMEDIATE Intermediate     TRIMETH/SULFA >=320 RESISTANT Resistant     AMPICILLIN/SULBACTAM 8 SENSITIVE Sensitive     PIP/TAZO <=4 SENSITIVE Sensitive     * >=100,000 COLONIES/mL KLEBSIELLA PNEUMONIAE  MRSA Next Gen by PCR, Nasal     Status: None   Collection Time: 12/18/20 10:38 AM   Specimen: Nasal Mucosa; Nasal Swab  Result Value Ref Range Status   MRSA by PCR Next Gen NOT DETECTED NOT DETECTED Final    Comment: (NOTE) The GeneXpert MRSA Assay (FDA approved for NASAL specimens only), is one component of a comprehensive MRSA colonization surveillance program. It is not intended to diagnose MRSA infection nor to guide or monitor treatment for MRSA infections. Test performance is not FDA approved in patients less than 63 years old. Performed at [facility], [address], [place], [place] [address]   Culture, blood (Routine X 2) w Reflex to ID Panel     Status: None   Collection Time: 12/18/20 11:27 AM   Specimen: BLOOD RIGHT ARM  Result  Value Ref Range Status   Specimen Description BLOOD RIGHT ARM  Final   Special Requests   Final    BOTTLES DRAWN AEROBIC ONLY Blood Culture adequate volume   Culture   Final    NO GROWTH 5 DAYS Performed at [facility]  Lab, [address] [place], [place] [address]    Report Status 12/23/2020 FINAL  Final  Culture, blood (Routine X 2) w Reflex to ID Panel     Status: None   Collection Time: 12/18/20 11:30 AM   Specimen: BLOOD RIGHT HAND  Result Value Ref Range Status   Specimen Description BLOOD RIGHT HAND  Final   Special Requests   Final    BOTTLES DRAWN AEROBIC ONLY Blood Culture results may not be optimal due to an inadequate volume of blood received in culture bottles   Culture   Final    NO GROWTH 5 DAYS Performed at [facility], [address] [place], [place] [address]    Report Status 12/23/2020 FINAL  Final         Radiology Studies: No results found.      Scheduled Meds:  apixaban  10 mg Oral BID   Followed by   Melene Muller ON 12/29/2020] apixaban  5 mg Oral BID   arformoterol  15 mcg Nebulization BID   aspirin  81 mg Oral Daily   atorvastatin  80 mg Oral Daily   budesonide (PULMICORT) nebulizer solution  0.5 mg Nebulization BID   chlorhexidine  15 mL Mouth Rinse BID   doxazosin  2 mg Oral Daily   ezetimibe  10 mg Oral Daily   gabapentin  300 mg Oral BID   insulin aspart  0-5 Units Subcutaneous QHS   insulin aspart  0-9 Units Subcutaneous TID WC   insulin glargine  5 Units Subcutaneous BID   mouth rinse  15 mL Mouth Rinse q12n4p   metoprolol tartrate  25 mg Oral BID   montelukast  10 mg Oral QHS   multivitamin with minerals  1 tablet Oral Daily   pantoprazole sodium  40 mg Oral QHS   sodium chloride flush  3 mL Intravenous Q12H   Continuous Infusions:  sodium chloride     sodium chloride 50 mL/hr at 12/26/20 0916     LOS: 23 days    Time spent:   Marcellus Scott, MD, [place], [facility]. [facility]  To contact the attending provider between 7A-7P or the covering provider during after hours 7P-7A, please log into the web site [email or web address] and access using universal Cook password for that web site. If you do not have the password, please call the  hospital operator.

## 2020-12-26 NOTE — Progress Notes (Signed)
Acute urinary retention reported by bedside RN.  In-N-Out cath performed, greater than 400 cc urine output obtained.  Patient is on Cardura.

## 2020-12-26 NOTE — Progress Notes (Signed)
Pt called out at 0520, cortrak pulled completely out from right nare and was laying across pt's chest. Cortrak tubing intact, tube feed immediately turned off. Bridle removed as pt was pulling at it. On call provider notified, TF to remain off for now. Pt shows no signs of aspiration at this time, HOB remains >30 degrees.

## 2020-12-26 NOTE — Progress Notes (Signed)
Nutrition Follow-up  DOCUMENTATION CODES:   Obesity unspecified  INTERVENTION:   -Initiate 48 hour calorie count; RD will follow-up on Tuesday, 12/29/20 for results -MVI with minerals daily -Magic cup TID with meals, each supplement provides 290 kcal and 9 grams of protein  -Feeding assistance with meals -If meal intake remains inadequate, may need to consider permanent feeding access (ex PEG) if this is within pt's goals of care  NUTRITION DIAGNOSIS:   Inadequate oral intake related to inability to eat as evidenced by NPO status.  Progressing; advanced to dysphagia 1 diet with honey thick liquids on 12/22/20  GOAL:   Patient will meet greater than or equal to 90% of their needs  Unmet  MONITOR:   Diet advancement, Labs, Weight trends, TF tolerance, I & O's  REASON FOR ASSESSMENT:   Ventilator, Consult Enteral/tube feeding initiation and management  ASSESSMENT:   70 year old male who presented to the ED on 6/09 with SOB. 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 Pt found to have NSTEMI and influenza A. Pt developed R facial droop, R arm drift and hand grip weakness along with R leg weakness and pt likely with CVA s/p tPA.  6/11 - intubated 6/14 - s/p LHC 6/17 - extubated, Cortrak placed (tip gastric) 6/28- s/p MBSS- advanced to dysphagia 1 diet with honey thick liquids 7/2- pt pulled out cortrak and bridle, TF on hold, MD requesting calorie count  Reviewed I/O's: -425 m x 24 hours and +518 ml since 12/12/20  UOP: 425 ml x 24 hours   Pt unavailable at time of visit.   Noted pt pulled out cortrak and bridle this morning. TF currently on hold per MD and requesting calorie count.   Noted meal completions have been poor (0-25%). If intake remains inadequate, may need to consider permanent feeding access (ex PEG) if this is within pt's goals of care.   Medications reviewed and include 0.9% sodium chloride infusion @ 50 ml/hr.   Labs reviewed: K: 3.4,  CBGS: 130-197 (inpatient orders for glycemic control are 0-5 units insulin aspart daily at bedtime, 0-9 units insulin aspart TID with meals and 5 units insulin glargine BID).    Diet Order:   Diet Order             DIET - DYS 1 Room service appropriate? No; Fluid consistency: Honey Thick  Diet effective now                   EDUCATION NEEDS:   No education needs have been identified at this time  Skin:  Skin Assessment: Reviewed RN Assessment  Last BM:  6/29  Height:   Ht Readings from Last 1 Encounters:  12/05/20 5' 9.02" (1.753 m)    Weight:   Wt Readings from Last 1 Encounters:  12/25/20 95.4 kg   BMI:  Body mass index is 31.04 kg/m.  Estimated Nutritional Needs:   Kcal:  2300-2500  Protein:  120-140 grams  Fluid:  >/= 2.0 L    Levada Schilling, RD, LDN, CDCES Registered Dietitian II Certified Diabetes Care and Education Specialist Please refer to [facility] for RD and/or RD on-call/weekend/after hours pager

## 2020-12-26 NOTE — Progress Notes (Addendum)
  Speech Language Pathology Treatment: Dysphagia;Cognitive-Linquistic  Patient Details Name: Brandon Daniel MRN: [id number] DOB: 09-02-1950 Today's Date: 12/26/2020 Time: 8315-1761 SLP Time Calculation (min) (ACUTE ONLY): 34 min  Assessment / Plan / Recommendation Clinical Impression  Continued dysphagia and cognitive linguistic therapy. No family present this date. Provided education regarding skilled swallowing techniques to 1st shift nurse tech to assist with improved oral intake, decrease aspiration risk; including oral care prior to and following PO, tsp use for puree and honey thick with bolus placement of left side of oral cavity, palpating for swallowing, and alternating HTL with puree to reduced oral residuals. Pt continues to exhibit some right sided oral pocketing with puree, right sided anterior spillage, and some oral apraxia with decreased oral transit coordination. Instance of overt cough noted x1 with honey thick liquids, mixed with oral residuals and saliva (suspect pts saliva is thinning down textures at times). Cough noted NOT to be wet and was strong. Calorie count lunch intake form updated.   Continued intervention tasks for stimulating expressive and receptive language during functional tasks (repositioning and self feeding). Pt appeared to spontaneously verbalize "alright" x1 and was able to follow simple commands with repetition in 3/4 trials. Significant expressive aphasia persists overall and suspected milder form of receptive aphasia. Will continue to follow.    HPI HPI: [name] is a 70 y.o. male admitted 6/9 that  presented with SOB, was found to be Influenza A + and later on day of admission developed mild R facial droop, R arm drift and hand grip weakness along with R leg weakness. Left MCA and ACA CVA. Pt received tPa. VDRF 6/11-6/15. PMH: CAD, HTN, lumbar stenosis, HLD      SLP Plan  Continue with current plan of care       Recommendations  Diet  recommendations: Honey-thick liquid;Dysphagia 1 (puree) Liquids provided via: Teaspoon Medication Administration: Crushed with puree Supervision: Full supervision/cueing for compensatory strategies;Staff to assist with self feeding;Trained caregiver to feed patient Compensations: Small sips/bites;Slow rate;Minimize environmental distractions;Monitor for anterior loss;Follow solids with liquid Postural Changes and/or Swallow Maneuvers: Seated upright 90 degrees;Upright 30-60 min after meal                Oral Care Recommendations: Oral care BID;Oral care before and after PO;Staff/trained caregiver to provide oral care Follow up Recommendations: Skilled Nursing facility SLP Visit Diagnosis: Dysphagia, oropharyngeal phase (R13.12);Aphasia (R47.01);Cognitive communication deficit (R41.841) Plan: Continue with current plan of care       GO                Brandon Daniel H. MA, CCC-SLP Acute Rehabilitation Services   12/26/2020, 1:16 PM

## 2020-12-26 NOTE — Progress Notes (Signed)
Foley catheter placed at 2040 post bladder scan over 657 mL urine. MD indicated foley was to be inserted after In and Out X 2 in the past 24 hours. Clear yellow urine drained into bag. Pt tolerated procedure well. Post void bladder scan showed 0 mL.

## 2020-12-27 ENCOUNTER — Inpatient Hospital Stay (HOSPITAL_COMMUNITY): Payer: Medicare (Managed Care)

## 2020-12-27 DIAGNOSIS — E876 Hypokalemia: Secondary | ICD-10-CM | POA: Diagnosis not present

## 2020-12-27 DIAGNOSIS — R1310 Dysphagia, unspecified: Secondary | ICD-10-CM | POA: Diagnosis not present

## 2020-12-27 LAB — GLUCOSE, CAPILLARY
Glucose-Capillary: 190 mg/dL — ABNORMAL HIGH (ref 70–99)
Glucose-Capillary: 194 mg/dL — ABNORMAL HIGH (ref 70–99)
Glucose-Capillary: 189 mg/dL — ABNORMAL HIGH (ref 70–99)
Glucose-Capillary: 131 mg/dL — ABNORMAL HIGH (ref 70–99)

## 2020-12-27 MED ORDER — LOPERAMIDE HCL 2 MG PO CAPS
2.0000 mg | ORAL_CAPSULE | Freq: Three times a day (TID) | ORAL | Status: AC | PRN
  Administered 2020-12-27: 2 mg via ORAL
  Filled 2020-12-27: qty 1

## 2020-12-27 MED ORDER — CHLORHEXIDINE GLUCONATE CLOTH 2 % EX PADS
6.0000 | MEDICATED_PAD | Freq: Every day | CUTANEOUS | Status: DC
  Administered 2020-12-27 – 2021-01-12 (×18): 6 via TOPICAL

## 2020-12-27 MED ORDER — SODIUM CHLORIDE 0.9 % IV SOLN
3.0000 g | Freq: Four times a day (QID) | INTRAVENOUS | Status: DC
  Administered 2020-12-27 – 2021-01-01 (×18): 3 g via INTRAVENOUS
  Filled 2020-12-27 (×4): qty 8
  Filled 2020-12-27 (×2): qty 3
  Filled 2020-12-27 (×9): qty 8
  Filled 2020-12-27: qty 3
  Filled 2020-12-27 (×3): qty 8
  Filled 2020-12-27: qty 3
  Filled 2020-12-27: qty 8

## 2020-12-27 NOTE — Progress Notes (Addendum)
Received a call from bedside RN regarding the patient having large emesis, unwitnessed, with concern for aspiration.  Came to bedside.  Patient does not appear in distress.  He is non verbal but alert and follows commands.  Mild rales noted at left base.  O2 saturation 98% on 2L.  Ordered IV Unasyn to cover for possible aspiration.  We will continue to closely monitor.  Aspiration precautions are in place.

## 2020-12-27 NOTE — Progress Notes (Signed)
Attempted to wean patient off O2. Started at 3L, turned flow down to 2.5L. Patient's O2Sats dropped down to 89-90. Returned O2 flow to 3L.

## 2020-12-27 NOTE — Progress Notes (Signed)
Pharmacy Antibiotic Note  Kalon Erhardt is a 70 y.o. male admitted on 12/03/2020 with pneumonia.  Pharmacy has been consulted for unasyn dosing.  WBC 6.5 yesterday, afebrile. CXR showing streaky opacities at L lung base. Was recently treated for MSSA pneumonia and Klebsiella UTI.    Plan: Unasyn 3g IV every 6 hours Monitor renal fx, cx results, clinical pic  Height: 5' 9.02" (175.3 cm) Weight: 99.1 kg (218 lb 7.6 oz) IBW/kg (Calculated) : 70.74  Temp (24hrs), Avg:98.7 F (37.1 C), Min:98.5 F (36.9 C), Max:98.8 F (37.1 C)  Recent Labs  Lab 12/21/20 0350 12/22/20 0416 12/23/20 0259 12/26/20 0647  WBC 4.9  --  5.8 6.5  CREATININE 0.68 0.64 0.59* 0.50*    Estimated Creatinine Clearance: 104.1 mL/min (A) (by C-G formula based on SCr of 0.5 mg/dL (L)).    No Known Allergies  Antimicrobials this admission: Tamiflu 6/11 >> 6/15 CTX 6/11 >> 6/16 Azith 6/11 >> 6/14 Cefepime 6/24 >> 6/26 Vanc 6/24 >> 6/26 Meropenem 6/26>>6/29 Fosfomycin 6/29 x1 Unasyn 7/3 >>  Dose adjustments this admission: N/A  Microbiology results: 6/9 Influenza A+ 6/10 MRSA PCR - negative 6/12 TA - negative 6/23 TA - Staph aureus: pan sens except R to clindamycin, erythromycin 6/24 Urine - >100,000 ESBL Kleb Pneumoniae:  Sens to meropenem, unasyn, cefepime, cipro, zosyn;  Resistant to amp, cefazolin, ceftriaxone, septra and I to nitrofurantoin 6/24 Bld - negF 6/24 MRSA PCR: neg  Thank you for allowing pharmacy to be a part of this patient's care.  Sherron Monday, PharmD, BCCCP Clinical Pharmacist  Phone: [phone] 12/27/2020 9:22 PM  Please check AMION for all [facility] Pharmacy phone numbers After 10:00 PM, call Main Pharmacy [phone]

## 2020-12-27 NOTE — Progress Notes (Signed)
PROGRESS NOTE    Brandon Daniel  KNL:[id number] DOB: [birth year] DOA: 12/03/2020 PCP: Pcp, No    Brief Narrative:  70 year old male with a history of coronary artery disease, hypertension, hyperlipidemia, admitted to the hospital with shortness of breath.  He was found to be influenza positive, as well as having an elevated troponin.  The day after admission, he was noted to have acute change in neuro status with acute right-sided weakness.  He was found to have left MCA/ACA territory infarct.  Seen by neurology and received tPA.  On 6/10, he had worsening respiratory status requiring intubation.  He was noted to have bilateral lower lobe pneumonia. He was treated with antibiotics and lasix. Subsequently extubated on 6/17. Transferred to [facility] on 6/22. He had recurrence of fever and was found to have Klebsiella ESBL UTI and staph pneumonia. He is currently on Meropenem. He has significant dysphagia and currently receiving nutrition through [facility]rak. He was also noted to have RLE DVT and started on DOAC. Disposition would be to CIR vs. SNF vs. LTACH (his current insurance reportedly does not cover).  SLP cleared him for dysphagia 1 diet and honey thickened liquids on 6/28.  Core track fell out 7/2.  Trial of oral intake, calorie count to see if this meets his nutritional requirements, if not will need to discuss with patient/family regarding PEG tube.  Will monitor over the next 24 hours.   Assessment & Plan:   Principal Problem:   Influenza A Active Problems:   NSTEMI (non-ST elevated myocardial infarction) (HCC)   CAD (coronary artery disease)   Asthma   Essential hypertension   Hyperlipidemia   Dyspnea   Pleural effusion on left   Stroke (cerebrum) (HCC)   Acute respiratory failure with hypoxia (HCC)   Pressure injury of skin   Left MCA/ACA territory stroke -he has dysphagia, aphasia and right hemiplegia -Received tPA on 6/9 secondary to symptomatic high-grade proximal left carotid  stenosis -Neurology evaluated patient and appear to have signed off. 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 -Recommendations were for medical management for now -He will need to follow-up with cardiology to be considered for further therapy, provided he makes further neurologic recovery  Acute respiratory failure with hypoxia -Secondary to pneumonia/left pleural effusion/increased respiratory secretions -He was previously treated with antivirals for influenza as well as completed a course of antibiotics -Repeat findings indicate possible new staph pneumonia -Continue chest physical therapy -Extubated 6/17.  As per RN report, overnight desaturated to 89-90% on 0.5 L/min [place] oxygen and hence placed back on oxygen.  Incentive spirometry. -he is at high risk for recurrent aspiration -Repeat chest x-ray 6/30 without acute findings.  ESBL Klebsiella UTI -Completed course of antibiotics including 2 days of cefepime followed by 3 days of meropenem and finally a dose of fosfomycin on 6/29.  MSSA pneumonia Completed course of antibiotics including 2 days of cefepime, 3 days of vancomycin, 3 days  of meropenem.  RLE peroneal DVT -venous dopplers show DVT in RLE, below the knee that is age indeterminate -patient does not have any known prior history of DVT   -Previous hospitalist MD discussed with Dr. Vassie Loll, PCCM and it was felt reasonable to hold anticoagulation, monitor for now and repeat venous dopplers in 3-5 days -venous dopplers repeated on 6/28 that showed persistent DVT -since patient is immobile, he would be considered higher risk extension of DVT -will start on DOAC for 3 months -Previous hospitalist MD discussed with neurology Dr. Roda Shutters, and it was recommended to discontinue plavix while on anticoagulation. He should continue on aspirin -once his anticoagulation course is complete, he should return to DAPT   AKI Resolved.  Type 2 diabetes -A1c of 6.2 -Continue sliding scale insulin and Lantus -blood sugars mildly uncontrolled and fluctuating.  No change in regimen.  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  ST discussed extensively with RN regarding feeding techniques.  As per RN, oral intake improving. - Core track fell out 7/2.  Trial of oral intake, calorie count to see if this meets his nutritional requirements, if not will need to discuss with patient/family regarding PEG tube.  Will monitor over the next 24 hours. -As per discussion with RN and bedside NT, oral intake improving.  Urinary retention, recurrent Foley catheter had been removed, patient developed recurrent urinary retention requiring multiple in and out catheterizations.  Foley catheter placed back again on 7/2.  Cardura switched to Flomax.  Catheter to remain until follow-up with urology in approximately 2 weeks.  Hypertension -stable on metoprolol  Goals of care -seen by palliative care. Family has elected to continue with full scope of treatment  Diarrhea Has a rectal tube.  May be related to tube feeds versus other etiologies.  Low index of suspicion for C. difficile.  Ongoing loose stools.  Trial of Imodium 2 mg every 8 hourly as needed x1 day.  Nonsustained SVT/VT: 2D echo 12/04/2020: LVEF 55-60%.  Check TSH: 0.833.   Try to keep potassium greater than 4 and magnesium greater than 2.  Continue telemetry.    Abnormal LFTs: Mildly elevated AST and ALT, stable for the last 3 days.  Follow periodically.  Unclear etiology.  No prior numbers to compare.  Prediabetes: A1c 6.2.  Patient was on high doses of Lantus, mealtime NovoLog and SSI while on tube feeds.  Now that tube feeds are off, reduced all insulins due to risk of hypoglycemia.  Monitor closely.  Good control.  Hypokalemia: Replace and follow.   DVT prophylaxis: eliquis apixaban (ELIQUIS) tablet 10 mg  apixaban (ELIQUIS) tablet 5 mg  Code Status: full code Family Communication: I discussed in detail with 1 of patient's nephews on 7/1, updated care and answered all questions. Disposition Plan: Status is: Inpatient  Remains inpatient appropriate because:Ongoing diagnostic testing needed not appropriate for outpatient work up and Inpatient level of care appropriate due to severity of illness.  As per discussion with TOC team this morning, LTAC was declined by insurance.  Dispo: The patient is from: Home              Anticipated d/c is to:  CIR  vs. SNF vs. LTACH              Patient currently is not medically stable to d/c.   Difficult to place patient No    Consultants:  Neurology (signed off) PCCM (signed off) Cardiology (signed off) Palliative  care medicine.  Procedures:  ETT 6/10>6/17 LHC 6/14  Antimicrobials:  Tamiflu completed 6/13 Azithromycin 6/11 > 6/14 Ceftriaxone 6/11 > 6/15  Vancomycin 6/24> 6/26 Cefepime 6/24 > 6/26 Meropenem 6/26>   Subjective: Working with NT.  Per ENT, tolerated 75% of breakfast this morning.  Nursing also indicated that he ate more than 50% of lunch yesterday.  Patient nods and denies complaints including no pain or dyspnea.  Objective: Vitals:   12/27/20 0616 12/27/20 0827 12/27/20 0836 12/27/20 1012  BP:   132/72 132/72  Pulse:  100 96 99  Resp:  (!) 22 19   Temp:    98.8 F (37.1 C)   TempSrc:    Oral  SpO2:  97% 98% 98%  Weight: 99.1 kg     Height:        Intake/Output Summary (Last 24 hours) at 12/27/2020 1216 Last data filed at 12/27/2020 1000 Gross per 24 hour  Intake 125 ml  Output 1500 ml  Net -1375 ml   Filed Weights   12/25/20 0500 12/27/20 0500 12/27/20 0616  Weight: 95.4 kg 98.8 kg 99.1 kg    Examination:  General exam: Middle-age male, moderately built and nourished lying comfortably propped up in bed. Respiratory system: Clear to auscultation.  No increased work of breathing. Cardiovascular system: S1 and S2 heard, RRR.  No JVD, murmurs or pedal edema.  Telemetry personally reviewed: Sinus rhythm.  An episode of 7 beat NSVT. Gastrointestinal system: Abdomen is nondistended, soft and nontender. No organomegaly or masses felt. Normal bowel sounds heard. Central nervous system: Alert and oriented-communicates with head nods and waving with left upper extremity.  Nods appropriately indicating yes or no.  Waves with left hand.  Follows instructions.  Dense right hemiplegia i.e. 0/5 power.  Normal power left limbs.   Extremities: No C/C/E, +pedal pulses.  Has a right heel floater. Skin: No rashes, lesions or ulcers Psychiatry: unable to assess.  Flat affect. ENT: No NG tube any longer.    Data Reviewed: I have personally reviewed following labs and imaging studies  CBC: Recent Labs  Lab 12/21/20 0350 12/23/20 0259 12/26/20 0647  WBC 4.9 5.8 6.5  HGB 9.5* 10.2* 10.0*  HCT 32.5* 33.5* 32.2*  MCV 93.9 91.0 89.0  PLT 382 345 250   Basic Metabolic Panel: Recent Labs  Lab 12/21/20 0350 12/22/20 0416 12/23/20 0259 12/23/20 1245 12/26/20 0647  NA 144 142 141  --  136  K 3.8 4.0 4.2  --  3.4*  CL 110 106 106  --  100  CO2 29 30 28   --  28  GLUCOSE 199* 144* 220*  --  116*  BUN 33* 30* 28*  --  21  CREATININE 0.68 0.64 0.59*  --  0.50*  CALCIUM 8.1* 8.5* 8.4*  --  8.2*  MG  --   --   --  2.1 1.9   GFR: Estimated Creatinine Clearance:  104.1 mL/min (A) (by C-G formula based on SCr of 0.5 mg/dL (L)). Liver Function Tests: Recent Labs  Lab 12/21/20 0350 12/22/20 0416 12/23/20 0259 12/26/20 0647  AST 93* 101* 99* 62*  ALT 100* 123* 130* 101*  ALKPHOS 71 68 69 75  BILITOT 0.5 0.3 0.3 0.5  PROT 5.9* 6.3* 6.1* 6.1*  ALBUMIN 1.7* 1.9* 2.0* 2.1*    CBG: Recent Labs  Lab 12/26/20 1154 12/26/20 1610 12/26/20 2116 12/27/20 0608 12/27/20 1148  GLUCAP 175* 140* 167* 190* 194*    Recent Results (from the past 240  hour(s))  Culture, Respiratory w Gram Stain     Status: None   Collection Time: 12/17/20  5:58 PM   Specimen: Tracheal Aspirate; Respiratory  Result Value Ref Range Status   Specimen Description TRACHEAL ASPIRATE  Final   Special Requests NONE  Final   Gram Stain   Final    ABUNDANT WBC PRESENT,BOTH PMN AND MONONUCLEAR ABUNDANT GRAM POSITIVE COCCI IN PAIRS RARE GRAM POSITIVE RODS Performed at [facility], [address], [place], [place] [address]    Culture ABUNDANT STAPHYLOCOCCUS AUREUS  Final   Report Status 12/20/2020 FINAL  Final   Organism ID, Bacteria STAPHYLOCOCCUS AUREUS  Final      Susceptibility   Staphylococcus aureus - MIC*    CIPROFLOXACIN <=0.5 SENSITIVE Sensitive     ERYTHROMYCIN >=8 RESISTANT Resistant     GENTAMICIN <=0.5 SENSITIVE Sensitive     OXACILLIN 0.5 SENSITIVE Sensitive     TETRACYCLINE <=1 SENSITIVE Sensitive     VANCOMYCIN <=0.5 SENSITIVE Sensitive     TRIMETH/SULFA <=10 SENSITIVE Sensitive     CLINDAMYCIN RESISTANT Resistant     RIFAMPIN <=0.5 SENSITIVE Sensitive     Inducible Clindamycin POSITIVE Resistant     * ABUNDANT STAPHYLOCOCCUS AUREUS  Culture, Urine     Status: Abnormal   Collection Time: 12/18/20  8:29 AM   Specimen: Urine, Random  Result Value Ref Range Status   Specimen Description URINE, RANDOM  Final   Special Requests   Final    NONE Performed at [facility], [address] [place], [place] [address]    Culture (A)  Final     >=100,000 COLONIES/mL KLEBSIELLA PNEUMONIAE Confirmed Extended Spectrum Beta-Lactamase Producer (ESBL).  In bloodstream infections from ESBL organisms, carbapenems are preferred over piperacillin/tazobactam. They are shown to have a lower risk of mortality.    Report Status 12/20/2020 FINAL  Final   Organism ID, Bacteria KLEBSIELLA PNEUMONIAE (A)  Final      Susceptibility   Klebsiella pneumoniae - MIC*    AMPICILLIN >=32 RESISTANT Resistant     CEFAZOLIN >=64 RESISTANT Resistant     CEFEPIME 2 SENSITIVE Sensitive     CEFTRIAXONE >=64 RESISTANT Resistant     CIPROFLOXACIN 0.5 SENSITIVE Sensitive     GENTAMICIN <=1 SENSITIVE Sensitive     IMIPENEM <=0.25 SENSITIVE Sensitive     NITROFURANTOIN 64 INTERMEDIATE Intermediate     TRIMETH/SULFA >=320 RESISTANT Resistant     AMPICILLIN/SULBACTAM 8 SENSITIVE Sensitive     PIP/TAZO <=4 SENSITIVE Sensitive     * >=100,000 COLONIES/mL KLEBSIELLA PNEUMONIAE  MRSA Next Gen by PCR, Nasal     Status: None   Collection Time: 12/18/20 10:38 AM   Specimen: Nasal Mucosa; Nasal Swab  Result Value Ref Range Status   MRSA by PCR Next Gen NOT DETECTED NOT DETECTED Final    Comment: (NOTE) The GeneXpert MRSA Assay (FDA approved for NASAL specimens only), is one component of a comprehensive MRSA colonization surveillance program. It is not intended to diagnose MRSA infection nor to guide or monitor treatment for MRSA infections. Test performance is not FDA approved in patients less than 63 years old. Performed at [facility], [address], [place], [place] [address]   Culture, blood (Routine X 2) w Reflex to ID Panel     Status: None   Collection Time: 12/18/20 11:27 AM   Specimen: BLOOD RIGHT ARM  Result Value Ref Range Status   Specimen Description BLOOD RIGHT ARM  Final  Special Requests   Final    BOTTLES DRAWN AEROBIC ONLY Blood Culture adequate volume   Culture   Final    NO GROWTH 5 DAYS Performed at [facility], [address] [place], [place] [address]    Report Status 12/23/2020 FINAL  Final  Culture, blood (Routine X 2) w Reflex to ID Panel     Status: None   Collection Time: 12/18/20 11:30 AM   Specimen: BLOOD RIGHT HAND  Result Value Ref Range Status   Specimen Description BLOOD RIGHT HAND  Final   Special Requests   Final    BOTTLES DRAWN AEROBIC ONLY Blood Culture results may not be optimal due to an inadequate volume of blood received in culture bottles   Culture   Final    NO GROWTH 5 DAYS Performed at [facility], [address] [place], [place] [address]    Report Status 12/23/2020 FINAL  Final         Radiology Studies: No results found.      Scheduled Meds:  apixaban  10 mg Oral BID   Followed by   Melene Muller ON 12/29/2020] apixaban  5 mg Oral BID   arformoterol  15 mcg Nebulization BID   aspirin  81 mg Oral Daily   atorvastatin  80 mg Oral Daily   budesonide (PULMICORT) nebulizer solution  0.5 mg Nebulization BID   chlorhexidine  15 mL Mouth Rinse BID   Chlorhexidine Gluconate Cloth  6 each Topical Daily   ezetimibe  10 mg Oral Daily   gabapentin  300 mg Oral BID   insulin aspart  0-5 Units Subcutaneous QHS   insulin aspart  0-9 Units Subcutaneous TID WC   insulin glargine  5 Units Subcutaneous BID   mouth rinse  15 mL Mouth Rinse q12n4p   metoprolol tartrate  25 mg Oral BID   montelukast  10 mg Oral QHS   multivitamin with minerals  1 tablet Oral Daily   pantoprazole sodium  40 mg Oral QHS   sodium chloride flush  3 mL Intravenous Q12H   tamsulosin  0.4 mg Oral QPC supper   Continuous Infusions:  sodium chloride     sodium chloride 50 mL/hr at 12/26/20 0916     LOS: 24 days    Time spent:   Marcellus Scott, MD, [place], [facility]. [facility]  To contact the attending provider between 7A-7P or the covering provider during after hours 7P-7A, please log into the web site [email or web address] and access using universal [place] password for that web site.  If you do not have the password, please call the hospital operator.

## 2020-12-28 DIAGNOSIS — R1310 Dysphagia, unspecified: Secondary | ICD-10-CM | POA: Diagnosis not present

## 2020-12-28 DIAGNOSIS — E876 Hypokalemia: Secondary | ICD-10-CM | POA: Diagnosis not present

## 2020-12-28 LAB — GLUCOSE, CAPILLARY
Glucose-Capillary: 133 mg/dL — ABNORMAL HIGH (ref 70–99)
Glucose-Capillary: 200 mg/dL — ABNORMAL HIGH (ref 70–99)
Glucose-Capillary: 162 mg/dL — ABNORMAL HIGH (ref 70–99)
Glucose-Capillary: 108 mg/dL — ABNORMAL HIGH (ref 70–99)

## 2020-12-28 LAB — COMPREHENSIVE METABOLIC PANEL
ALT: 83 U/L — ABNORMAL HIGH (ref 0–44)
AST: 52 U/L — ABNORMAL HIGH (ref 15–41)
Albumin: 2.1 g/dL — ABNORMAL LOW (ref 3.5–5.0)
Alkaline Phosphatase: 74 U/L (ref 38–126)
Anion gap: 6 (ref 5–15)
BUN: 15 mg/dL (ref 8–23)
CO2: 28 mmol/L (ref 22–32)
Calcium: 8.2 mg/dL — ABNORMAL LOW (ref 8.9–10.3)
Chloride: 101 mmol/L (ref 98–111)
Creatinine, Ser: 0.48 mg/dL — ABNORMAL LOW (ref 0.61–1.24)
GFR, Estimated: >60 mL/min (ref >60)
Glucose, Bld: 134 mg/dL — ABNORMAL HIGH (ref 70–99)
Potassium: 3.6 mmol/L (ref 3.5–5.1)
Sodium: 135 mmol/L (ref 135–145)
Total Bilirubin: 0.6 mg/dL (ref 0.3–1.2)
Total Protein: 5.9 g/dL — ABNORMAL LOW (ref 6.5–8.1)

## 2020-12-28 LAB — MAGNESIUM: Magnesium: 1.9 mg/dL (ref 1.7–2.4)

## 2020-12-28 LAB — PROCALCITONIN: Procalcitonin: 0.13 ng/mL

## 2020-12-28 MED ORDER — LOPERAMIDE HCL 2 MG PO CAPS
2.0000 mg | ORAL_CAPSULE | Freq: Three times a day (TID) | ORAL | Status: AC
  Administered 2020-12-28 (×2): 2 mg via ORAL
  Filled 2020-12-28 (×2): qty 1

## 2020-12-28 MED ORDER — MAGNESIUM SULFATE 2 GM/50ML IV SOLN
2.0000 g | Freq: Once | INTRAVENOUS | Status: AC
  Administered 2020-12-28: 2 g via INTRAVENOUS
  Filled 2020-12-28: qty 50

## 2020-12-28 MED ORDER — POTASSIUM CHLORIDE CRYS ER 20 MEQ PO TBCR
40.0000 meq | EXTENDED_RELEASE_TABLET | Freq: Once | ORAL | Status: AC
  Administered 2020-12-28: 40 meq via ORAL
  Filled 2020-12-28: qty 2

## 2020-12-28 NOTE — [place] FL2 (Signed)
[place] MEDICAID FL2 LEVEL OF CARE SCREENING TOOL     IDENTIFICATION  Patient Name: Brandon Daniel Birthdate: 1950/11/25 Sex: male Admission Date (Current Location): 12/03/2020  [facility] and [place] Number:  Producer, television/film/video and Address:  [facility] [place], [address] [place], [place] [address]      Provider Number: [id number]  Attending Physician Name and Address:  Elease Etienne, MD  Relative Name and Phone Number:       Current Level of Care: Hospital Recommended Level of Care: Skilled Nursing Facility Prior Approval Number:    Date Approved/Denied:   PASRR Number: [id number]  Discharge Plan: SNF    Current Diagnoses: Patient Active Problem List   Diagnosis Date Noted   Pressure injury of skin 12/24/2020   Acute respiratory failure with hypoxia [facility])    NSTEMI (non-ST elevated myocardial infarction) (HCC) 12/03/2020   Influenza A 12/03/2020   CAD (coronary artery disease) 12/03/2020   Asthma 12/03/2020   Essential hypertension 12/03/2020   Hyperlipidemia 12/03/2020   Dyspnea 12/03/2020   Pleural effusion on left 12/03/2020   Stroke (cerebrum) (HCC) 12/03/2020   Lumbosacral disc herniation    Chest pain 10/20/2020    Orientation RESPIRATION BLADDER Height & Weight     Self  O2 ([place] 2L) Incontinent Weight: 206 lb 9.1 oz (93.7 kg) Height:  5' 9.02" (175.3 cm)  BEHAVIORAL SYMPTOMS/MOOD NEUROLOGICAL BOWEL NUTRITION STATUS      Incontinent Diet (see DC summary)  AMBULATORY STATUS COMMUNICATION OF NEEDS Skin   Extensive Assist Non-Verbally Skin abrasions (left nare, open to air)                       Personal Care Assistance Level of Assistance  Bathing, Feeding, Dressing Bathing Assistance: Maximum assistance Feeding assistance: Maximum assistance Dressing Assistance: Maximum assistance     Functional Limitations Info  Sight, Speech Sight Info: Impaired   Speech Info: Impaired (dysarthria)    SPECIAL CARE FACTORS  FREQUENCY  PT (By licensed PT), OT (By licensed OT), Speech therapy     PT Frequency: 5x/wk OT Frequency: 5x/wk     Speech Therapy Frequency: 5x/wk      Contractures Contractures Info: Not present    Additional Factors Info  Code Status, Allergies, Insulin Sliding Scale, Isolation Precautions Code Status Info: Full Allergies Info: NKA   Insulin Sliding Scale Info: see DC summary Isolation Precautions Info: Contact precautions: ESBL     Current Medications (12/28/2020):  This is the current hospital active medication list Current Facility-Administered Medications  Medication Dose Route Frequency Provider Last Rate Last Admin   0.9 %  sodium chloride infusion  250 mL Intravenous PRN Orpah Cobb, MD       0.9 %  sodium chloride infusion   Intravenous Continuous Elease Etienne, MD 50 mL/hr at 12/28/20 0230 New Bag at 12/28/20 0230   acetaminophen (TYLENOL) tablet 650 mg  650 mg Oral Q4H PRN Hongalgi, Theadora Rama D, MD       albuterol (PROVENTIL) (2.5 MG/3ML) 0.083% nebulizer solution 3 mL  3 mL Inhalation Q4H PRN Chotiner, Claudean Severance, MD   3 mL at 12/25/20 2034   Ampicillin-Sulbactam (UNASYN) 3 g in sodium chloride 0.9 % 100 mL IVPB  3 g Intravenous Q6H Hongalgi, Anand D, MD 200 mL/hr at 12/28/20 0543 3 g at 12/28/20 0543   apixaban (ELIQUIS) tablet 10 mg  10 mg Oral BID Elease Etienne, MD   10 mg at 12/28/20  0930   Followed by   Melene Muller ON 12/29/2020] apixaban (ELIQUIS) tablet 5 mg  5 mg Oral BID Hongalgi, Maximino Greenland, MD       arformoterol (BROVANA) nebulizer solution 15 mcg  15 mcg Nebulization BID Chotiner, Claudean Severance, MD   15 mcg at 12/28/20 0901   aspirin chewable tablet 81 mg  81 mg Oral Daily Elease Etienne, MD   81 mg at 12/28/20 0930   atorvastatin (LIPITOR) tablet 80 mg  80 mg Oral Daily Marcellus Scott D, MD   80 mg at 12/28/20 0930   budesonide (PULMICORT) nebulizer solution 0.5 mg  0.5 mg Nebulization BID Hunsucker, Lesia Sago, MD   0.5 mg at 12/28/20 0901   chlorhexidine  (PERIDEX) 0.12 % solution 15 mL  15 mL Mouth Rinse BID Oretha Milch, MD   15 mL at 12/28/20 0930   Chlorhexidine Gluconate Cloth 2 % PADS 6 each  6 each Topical Daily Elease Etienne, MD   6 each at 12/28/20 0932   ezetimibe (ZETIA) tablet 10 mg  10 mg Oral Daily Elease Etienne, MD   10 mg at 12/28/20 0930   food thickener (SIMPLYTHICK (HONEY/LEVEL 3/MODERATELY THICK)) 10 packet  10 packet Oral PRN Erick Blinks, MD       gabapentin (NEURONTIN) 250 MG/5ML solution 300 mg  300 mg Oral BID Elease Etienne, MD   300 mg at 12/27/20 2236   guaiFENesin-dextromethorphan (ROBITUSSIN DM) 100-10 MG/5ML syrup 5 mL  5 mL Oral Q4H PRN Hongalgi, Maximino Greenland, MD       insulin aspart (novoLOG) injection 0-5 Units  0-5 Units Subcutaneous QHS Hongalgi, Anand D, MD       insulin aspart (novoLOG) injection 0-9 Units  0-9 Units Subcutaneous TID WC Elease Etienne, MD   1 Units at 12/28/20 0655   insulin glargine (LANTUS) injection 5 Units  5 Units Subcutaneous BID Elease Etienne, MD   5 Units at 12/28/20 0932   MEDLINE mouth rinse  15 mL Mouth Rinse q12n4p Cyril Mourning V, MD   15 mL at 12/27/20 1653   metoprolol tartrate (LOPRESSOR) 25 mg/10 mL oral suspension 25 mg  25 mg Oral BID Marcellus Scott D, MD   25 mg at 12/28/20 0931   montelukast (SINGULAIR) tablet 10 mg  10 mg Oral QHS Elease Etienne, MD   10 mg at 12/27/20 2236   multivitamin with minerals tablet 1 tablet  1 tablet Oral Daily Elease Etienne, MD   1 tablet at 12/28/20 0930   nitroGLYCERIN (NITROSTAT) SL tablet 0.4 mg  0.4 mg Sublingual Q5 min PRN Chotiner, Claudean Severance, MD       ondansetron [facility]) injection 4 mg  4 mg Intravenous Q6H PRN Chotiner, Claudean Severance, MD       pantoprazole sodium (PROTONIX) 40 mg/20 mL oral suspension 40 mg  40 mg Oral QHS Hongalgi, Anand D, MD   40 mg at 12/27/20 2236   senna-docusate (Senokot-S) tablet 1 tablet  1 tablet Oral QHS PRN Hongalgi, Theadora Rama D, MD       sodium chloride flush (NS) 0.9 % injection 3 mL  3 mL  Intravenous Q12H Orpah Cobb, MD   3 mL at 12/27/20 2249   sodium chloride flush (NS) 0.9 % injection 3 mL  3 mL Intravenous PRN Orpah Cobb, MD   3 mL at 12/17/20 1029   tamsulosin (FLOMAX) capsule 0.4 mg  0.4 mg Oral QPC supper Elease Etienne, MD  0.4 mg at 12/26/20 1710     Discharge Medications: Please see discharge summary for a list of discharge medications.  Relevant Imaging Results:  Relevant Lab Results:   Additional Information SS#: [id number]  Baldemar Lenis, LCSW

## 2020-12-28 NOTE — Progress Notes (Signed)
PROGRESS NOTE    Brandon Daniel  ZOX:[id number] DOB: Apr 14, 1951 DOA: 12/03/2020 PCP: Pcp, No    Brief Narrative:  70 year old male with a history of coronary artery disease, hypertension, hyperlipidemia, admitted to the hospital with shortness of breath.  He was found to be influenza positive, as well as having an elevated troponin.  The day after admission, he was noted to have acute change in neuro status with acute right-sided weakness.  He was found to have left MCA/ACA territory infarct.  Seen by neurology and received tPA.  On 6/10, he had worsening respiratory status requiring intubation.  He was noted to have bilateral lower lobe pneumonia. He was treated with antibiotics and lasix. Subsequently extubated on 6/17. Transferred to [facility] on 6/22. He had recurrence of fever and was found to have Klebsiella ESBL UTI and staph pneumonia. He is currently on Meropenem. He has significant dysphagia and currently receiving nutrition through cortrak. He was also noted to have RLE DVT and started on DOAC. Disposition would be to CIR vs. SNF vs. LTACH (his current insurance reportedly does not cover).  SLP cleared him for dysphagia 1 diet and honey thickened liquids on 6/28.  Core track fell out 7/2.  Trial of oral intake, calorie count to see if this meets his nutritional requirements, if not will need to discuss with patient/family regarding PEG tube.  RD will reassess 7/5 with calorie count.  If tolerating adequate p.o., should be ready for DC to SNF then.   Assessment & Plan:   Principal Problem:   Influenza A Active Problems:   NSTEMI (non-ST elevated myocardial infarction) (HCC)   CAD (coronary artery disease)   Asthma   Essential hypertension   Hyperlipidemia   Dyspnea   Pleural effusion on left   Stroke (cerebrum) (HCC)   Acute respiratory failure with hypoxia (HCC)   Pressure injury of skin   Left MCA/ACA territory stroke -he has dysphagia, aphasia and right hemiplegia -Received tPA on  6/9 secondary to symptomatic high-grade proximal left carotid stenosis -Neurology evaluated patient and appear to have signed off. 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 -Recommendations were for medical management for now -He will need to follow-up with cardiology to be considered for further therapy, provided he makes further neurologic recovery  Acute respiratory failure with hypoxia -Secondary to pneumonia/left pleural effusion/increased respiratory secretions -He was previously treated with antivirals for influenza as well as completed a course of antibiotics -Repeat findings indicate possible new staph pneumonia -Continue chest physical therapy -Extubated 6/17.  As per RN report, overnight desaturated to 89-90% on 0.5 L/min [place] oxygen and hence placed back on oxygen.  Incentive spirometry.  Currently on 2 L/min [place] O2. -he is at high risk for recurrent aspiration -Repeat chest x-ray 6/30 without acute findings. -Had an episode of emesis night of 7/3, concern for recurrent aspiration although chest x-ray without new or acute findings.  Procalcitonin 0.13.  Back  on IV Unasyn.  ESBL Klebsiella UTI -Completed course of antibiotics including 2 days of cefepime followed by 3 days of meropenem and finally a dose of fosfomycin on 6/29.  MSSA pneumonia/concern  for aspiration pneumonia Completed course of antibiotics including 2 days of cefepime, 3 days of vancomycin, 3 days of meropenem. -Had an episode of emesis night of 7/3, concern for recurrent aspiration although chest x-ray without new or acute findings.  Procalcitonin 0.13.  Back on IV Unasyn.  RLE peroneal DVT -venous dopplers show DVT in RLE, below the knee that is age indeterminate -patient does not have any known prior history of DVT  -Previous hospitalist MD discussed with Dr. Vassie Loll, PCCM and it was felt reasonable to hold anticoagulation, monitor for now and repeat venous dopplers in 3-5 days -venous dopplers repeated on 6/28 that showed persistent DVT -since patient is immobile, he would be considered higher risk extension of DVT -will start on DOAC for 3 months -Previous hospitalist MD discussed with neurology Dr. Roda Shutters, and it was recommended to discontinue plavix while on anticoagulation. He should continue on aspirin -once his anticoagulation course is complete, he should return to DAPT  AKI Resolved.  Prediabetes Vs Type 2 diabetes A1c 6.2.  Patient was on high doses of Lantus, mealtime NovoLog and SSI while on tube feeds.  Now that tube feeds are off, reduced all insulins due to risk of hypoglycemia.  Monitor closely.  Good control.  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  ST discussed extensively with RN regarding feeding techniques.  As per RN, oral intake improving. - Core track fell out 7/2.  Trial of oral intake, calorie count to see if this meets his nutritional requirements, if not will need to discuss with patient/family regarding PEG tube.  Will monitor over the next 24 hours. -As per discussion with RN and bedside NT, oral intake improving. RD plans to reassess 7/5 including calorie count.  Urinary retention, recurrent Foley catheter had been removed, patient developed recurrent urinary retention requiring multiple in and out  catheterizations.  Foley catheter placed back again on 7/2.  Cardura switched to Flomax.  Catheter to remain until follow-up with urology in approximately 2 weeks.  Hypertension -stable on metoprolol  Goals of care -seen by palliative care. Family has elected to continue with full scope of treatment  Diarrhea Has a rectal tube.  May be related to tube feeds versus other etiologies.  Low index of suspicion for C. difficile.  Got a dose of Imodium yesterday morning but none continued.  We will repeat Imodium.  Nonsustained SVT/VT: 2D echo 12/04/2020: LVEF 55-60%.  Check TSH: 0.833.  Try to keep potassium greater than 4 and magnesium greater than 2.  Continue telemetry.    Abnormal LFTs: Improving.  Could have been due to tube feeds.  Periodically follow LFTs.  Hypokalemia: Replace and follow.   DVT prophylaxis: eliquis apixaban (ELIQUIS) tablet 10 mg  apixaban (ELIQUIS) tablet 5 mg  Code Status: full code Family Communication: I discussed in detail with 1 of patient's nephews on 7/1, updated care and answered all questions. Disposition Plan: Status is: Inpatient  Remains inpatient appropriate because:Ongoing diagnostic testing needed not appropriate for outpatient work up and Inpatient level of care appropriate due to severity of illness.  As per discussion with TOC team this morning, LTAC was declined by insurance.  Dispo: The patient is from: Home  Anticipated d/c is to:  CIR  vs. SNF vs. LTACH              Patient currently is not medically stable to d/c.   Difficult to place patient No    Consultants:  Neurology (signed off) PCCM (signed off) Cardiology (signed off) Palliative care medicine.  Procedures:  ETT 6/10>6/17 LHC 6/14  Antimicrobials:  Tamiflu completed 6/13 Azithromycin 6/11 > 6/14 Ceftriaxone 6/11 > 6/15  Vancomycin 6/24> 6/26 Cefepime 6/24 > 6/26 Meropenem 6/26>   Subjective: Patient attempts to say something but unable to,  nonverbal/expressive aphasia.  Does follow simple instructions with head nods and actions with his left upper extremity.  Denies pain or difficulty breathing.  Objective: Vitals:   12/28/20 0500 12/28/20 0903 12/28/20 0910 12/28/20 1214  BP:   133/67 (!) 141/76  Pulse:   85 85  Resp:   (!) 22 18  Temp:   97.8 F (36.6 C) 98.5 F (36.9 C)  TempSrc:   Oral Oral  SpO2:  97%    Weight: 93.7 kg     Height:        Intake/Output Summary (Last 24 hours) at 12/28/2020 1307 Last data filed at 12/28/2020 1110 Gross per 24 hour  Intake 295 ml  Output 1500 ml  Net -1205 ml   Filed Weights   12/27/20 0500 12/27/20 0616 12/28/20 0500  Weight: 98.8 kg 99.1 kg 93.7 kg    Examination:  General exam: Middle-age male, moderately built and nourished lying comfortably propped up in bed. Respiratory system: Slightly diminished breath sounds in the bases but otherwise clear to auscultation.  No increased work of breathing. Cardiovascular system: S1 and S2 heard, RRR.  No JVD, murmurs or pedal edema.  Telemetry personally reviewed: Sinus rhythm. Gastrointestinal system: Abdomen is nondistended, soft and nontender. No organomegaly or masses felt. Normal bowel sounds heard. Central nervous system: Alert and oriented-communicates with head nods and waving with left upper extremity.  Nods appropriately indicating yes or no.  Waves with left hand.  Follows instructions.  Dense right hemiplegia i.e. 0/5 power.  Normal power left limbs.   Extremities: No C/C/E, +pedal pulses.  Has a right heel floater. Skin: No rashes, lesions or ulcers Psychiatry: unable to assess.  Flat affect. ENT: No NG tube any longer.    Data Reviewed: I have personally reviewed following labs and imaging studies  CBC: Recent Labs  Lab 12/23/20 0259 12/26/20 0647  WBC 5.8 6.5  HGB 10.2* 10.0*  HCT 33.5* 32.2*  MCV 91.0 89.0  PLT 345 250   Basic Metabolic Panel: Recent Labs  Lab 12/22/20 0416 12/23/20 0259 12/23/20 1245  12/26/20 0647 12/28/20 0155  NA 142 141  --  136 135  K 4.0 4.2  --  3.4* 3.6  CL 106 106  --  100 101  CO2 30 28  --  28 28  GLUCOSE 144* 220*  --  116* 134*  BUN 30* 28*  --  21 15  CREATININE 0.64 0.59*  --  0.50* 0.48*  CALCIUM 8.5* 8.4*  --  8.2* 8.2*  MG  --   --  2.1 1.9 1.9   GFR: Estimated Creatinine Clearance: 101.3 mL/min (A) (by C-G formula based on SCr of 0.48 mg/dL (L)). Liver Function Tests: Recent Labs  Lab 12/22/20 0416 12/23/20 0259 12/26/20 0647 12/28/20 0155  AST 101* 99* 62* 52*  ALT 123* 130* 101* 83*  ALKPHOS 68 69 75 74  BILITOT 0.3 0.3 0.5 0.6  PROT 6.3* 6.1* 6.1* 5.9*  ALBUMIN 1.9* 2.0* 2.1* 2.1*    CBG: Recent Labs  Lab 12/27/20 1148 12/27/20 1616 12/27/20 2123 12/28/20 0610 12/28/20 1153  GLUCAP 194* 189* 131* 133* 200*    No results found for this or any previous visit (from the past 240 hour(s)).        Radiology Studies: DG CHEST PORT 1 VIEW  Result Date: 12/27/2020 CLINICAL DATA:  Cough. EXAM: PORTABLE CHEST 1 VIEW COMPARISON:  Radiograph 3 days ago 12/24/2020, additional priors. FINDINGS: Post median sternotomy. Borderline cardiomegaly with stable mediastinal contours. Coronary stent is seen. Blunting of the left costophrenic angle similar to prior exam with residual streaky basilar opacity. No new consolidation. No pulmonary edema or pneumothorax no acute osseous abnormalities are seen. IMPRESSION: 1. Similar small left pleural effusion. 2. Persistent streaky opacities at the left lung base, favoring atelectasis or scarring. 3. Otherwise stable exam. Electronically Signed   By: Narda Rutherford M.D.   On: 12/27/2020 21:10        Scheduled Meds:  apixaban  10 mg Oral BID   Followed by   Melene Muller ON 12/29/2020] apixaban  5 mg Oral BID   arformoterol  15 mcg Nebulization BID   aspirin  81 mg Oral Daily   atorvastatin  80 mg Oral Daily   budesonide (PULMICORT) nebulizer solution  0.5 mg Nebulization BID   chlorhexidine  15 mL  Mouth Rinse BID   Chlorhexidine Gluconate Cloth  6 each Topical Daily   ezetimibe  10 mg Oral Daily   gabapentin  300 mg Oral BID   insulin aspart  0-5 Units Subcutaneous QHS   insulin aspart  0-9 Units Subcutaneous TID WC   insulin glargine  5 Units Subcutaneous BID   mouth rinse  15 mL Mouth Rinse q12n4p   metoprolol tartrate  25 mg Oral BID   montelukast  10 mg Oral QHS   multivitamin with minerals  1 tablet Oral Daily   pantoprazole sodium  40 mg Oral QHS   sodium chloride flush  3 mL Intravenous Q12H   tamsulosin  0.4 mg Oral QPC supper   Continuous Infusions:  sodium chloride     sodium chloride 50 mL/hr at 12/28/20 0230   ampicillin-sulbactam (UNASYN) IV 3 g (12/28/20 1243)     LOS: 25 days    Time spent:   Marcellus Scott, MD, [place], [facility]. [facility]  To contact the attending provider between 7A-7P or the covering provider during after hours 7P-7A, please log into the web site [email or web address] and access using universal [place] password for that web site. If you do not have the password, please call the hospital operator.

## 2020-12-28 NOTE — Progress Notes (Signed)
  Speech Language Pathology Treatment: Dysphagia;Cognitive-Linquistic  Patient Details Name: Brandon Daniel MRN: [id number] DOB: March 28, 1951 Today's Date: 12/28/2020 Time: 0737-1062 SLP Time Calculation (min) (ACUTE ONLY): 26 min  Assessment / Plan / Recommendation Clinical Impression  Pt seen for swallowing tx incorporating speech/language/cog goals within functional communication/directives during swallow tx.  Pt consumed puree/honey-thickened liquids and trialed soft solid and nectar-thickened liquids via tsp.  Nectar-thickened liquids elicited a delayed throat clearing and.repetitive swallows.  Soft solids yielded anterior loss on R with mild R buccal pocketing cleared with lingual sweep with mod verbal cues provided.  Spence consumed honey-thickened liquids via spoon and puree consistencies without overt s/s of aspiration present, but oral holding/prolonged oral transit noted.   Speech/language tasks incorporated into PO intake consisted of 2-step directives during meal consumption such as lip closure, lingual sweep, and opening mouth/protruding tongue with 80% accuracy noted given min-mod visual/verbal cues.  Pt continues to be nonverbal with automatic speech tasks such as counting, MIT being unsuccessful this session.  Yes/No responses improved in accuracy as session progressed with an overall 85% achieved.  Continue current diet of D1/HTL with swallowing precautions and speech/language/cog tx to improve communicative competence.  HPI HPI: [name] is a 70 y.o. male admitted 6/9 that  presented with SOB, was found to be Influenza A + and later on day of admission developed mild R facial droop, R arm drift and hand grip weakness along with R leg weakness. Left MCA and ACA CVA. Pt received tPa. VDRF 6/11-6/15. PMH: CAD, HTN, lumbar stenosis, HLD; Diet initiated on 12/26/20 of D1/HTL; ST continues to f/u for speech/lang/swallowing tx.      SLP Plan  Continue with current plan of care        Recommendations  Diet recommendations: Honey-thick liquid;Dysphagia 1 (puree) Liquids provided via: Teaspoon Medication Administration: Crushed with puree Supervision: Full supervision/cueing for compensatory strategies;Staff to assist with self feeding Compensations: Slow rate;Small sips/bites;Clear throat intermittently;Monitor for anterior loss;Minimize environmental distractions Postural Changes and/or Swallow Maneuvers: Seated upright 90 degrees                Oral Care Recommendations: Oral care BID;Oral care before and after PO Follow up Recommendations: Skilled Nursing facility SLP Visit Diagnosis: Dysphagia, oropharyngeal phase (R13.12);Aphasia (R47.01);Cognitive communication deficit (I94.854) Plan: Continue with current plan of care                       Brandon Daniel, M.S., CCC-SLP 12/28/2020, 10:56 AM

## 2020-12-29 DIAGNOSIS — R1310 Dysphagia, unspecified: Secondary | ICD-10-CM | POA: Diagnosis not present

## 2020-12-29 DIAGNOSIS — E876 Hypokalemia: Secondary | ICD-10-CM | POA: Diagnosis not present

## 2020-12-29 LAB — GLUCOSE, CAPILLARY
Glucose-Capillary: 131 mg/dL — ABNORMAL HIGH (ref 70–99)
Glucose-Capillary: 199 mg/dL — ABNORMAL HIGH (ref 70–99)
Glucose-Capillary: 160 mg/dL — ABNORMAL HIGH (ref 70–99)
Glucose-Capillary: 159 mg/dL — ABNORMAL HIGH (ref 70–99)

## 2020-12-29 LAB — PROCALCITONIN: Procalcitonin: <0.1 ng/mL

## 2020-12-29 MED ORDER — QUETIAPINE FUMARATE 25 MG PO TABS: 25.0000 mg | ORAL_TABLET | Freq: Every day | ORAL | Status: DC

## 2020-12-29 MED ORDER — QUETIAPINE FUMARATE 25 MG PO TABS
12.5000 mg | ORAL_TABLET | Freq: Every day | ORAL | Status: DC
  Administered 2020-12-29 – 2021-01-11 (×15): 12.5 mg via ORAL
  Filled 2020-12-29 (×15): qty 1

## 2020-12-29 NOTE — Progress Notes (Signed)
Restless and hitting hand rail repeatedly w/ left hand. RT hand and leg remain flaccid and severe aphasia . He is only making grunting and moaning noises and will answer yes or no questions by nodding at times. Follows commands . Contacted  on call Dr. Margo Aye . See new orders.

## 2020-12-29 NOTE — Progress Notes (Signed)
Inpatient Rehabilitation Admissions Coordinator   I spoke with pt's nephew, Kandis Mannan, by phone to follow up on our previous discussions of rehab venue options. He is the spokesperson for the care of his Uncle. I updated him on current therapy progress. Kandis Mannan feels patient will need longer rehab recovery before coming home with his Dad and Mom in [place]. He will need to be ambulatory short distances prior to d/c home in their care and Kandis Mannan wishes for 2 to 3 month rehab stay which CIR can not offer. I have updated TOC, Kelly RN CM and we will sign off . SNF is requested.  Ottie Glazier, RN, MSN Rehab Admissions Coordinator [phone] 12/29/2020 4:11 PM

## 2020-12-29 NOTE — TOC Benefit Eligibility Note (Signed)
Transition of Care [facility]) Benefit Eligibility Note    Patient Details  Name: Brandon Daniel MRN: [id number] Date of Birth: 26-Jun-1951   Medication/Dose: Everlene Balls  10MG  BID   and  ELIQUIS  5 MG BID        Prescription Coverage Preferred Pharmacy: [facility]  and   [facility]     Co-Pay: $ 4.00        Additional Notes: PT'S HAS REG MEDICAID OF [place] : EFF-DATE 08-26-2019  CO-PAY- $4.00 FOR EACH  PRESCRIPTION    10-26-2019 Phone Number: 12/29/2020, 10:22 AM

## 2020-12-29 NOTE — Progress Notes (Signed)
Occupational Therapy Treatment Patient Details Name: Brandon Daniel MRN: [id number] DOB: 08/07/1950 Today's Date: 12/29/2020    History of present illness [name] is a 70 y.o. male admitted 6/9 that  presented with SOB, was found to be Influenza A + and later on day of admission developed mild R facial droop, R arm drift and hand grip weakness along with R leg weakness. Left MCA and ACA CVA. Pt received tPa. VDRF 6/11-6/15. PMH: CAD, HTN, lumbar stenosis, HLD   OT comments   Pt more alert this session using L hand over R hand techniques for AAROM. PROM towel slides on table attempting to wipe table. pt using wet wash cloth on table and trunk movement x3 times forward, backward and side to side sitting EOB facilitiating dynamic sitting balance. Pt continues to have posterior and L sided lean. Pt maxA for bed mobility and for sit to stands with R knee block. Pt appears more appropriate at this time with increased engagement and eye opening/command following for CIR. Pt would greatly benefit from continued OT skilled services. OT following acutely.   Follow Up Recommendations  CIR    Equipment Recommendations  Wheelchair cushion (measurements OT);Wheelchair (measurements OT);Hospital bed    Recommendations for Other Services Rehab consult    Precautions / Restrictions Precautions Precautions: Fall;Other (comment) Precaution Comments: R hemi Restrictions Weight Bearing Restrictions: No       Mobility Bed Mobility Overal bed mobility: Needs Assistance Bed Mobility: Supine to Sit;Sit to Supine Rolling: Min assist Sidelying to sit: Mod assist   Sit to supine: +2 for physical assistance;+2 for safety/equipment;Mod assist   General bed mobility comments: pt able to roll to R with min A and hold self in SL. Facilitation of L foot under R to assist with LE's off bed, mod A to complete. Mod HHA for elevation of trunk into sitting with pt having strong R lean initially. Pt able to  lift LLE back into bed. Mod A +2 for mgmt of LLE and trunk. Pt able to pull self towards HOB once able to grab headboard with L hand.    Transfers Overall transfer level: Needs assistance   Transfers: Sit to/from Stand Sit to Stand: +2 physical assistance;Max assist         General transfer comment: Pt stood 2x EOB with max A +2 for power up, PT/OT on each side of pt with pt's arms over therapists' shoulders. Pt winced and indicated foot pain with WB'ing, less the second time.    Balance Overall balance assessment: Needs assistance Sitting-balance support: No upper extremity supported;Single extremity supported;Feet supported Sitting balance-Leahy Scale: Poor Sitting balance - Comments: initially needed max A due to R lean, progressed to min at times when working on wt shifting but wuold return to needing max for posterior lean when fatigued. Postural control: Posterior lean;Right lateral lean Standing balance support: Bilateral upper extremity supported;During functional activity Standing balance-Leahy Scale: Zero Standing balance comment: max A +2 to maintain standing               High Level Balance Comments: worked on hand over hand reaching on tray table.           ADL either performed or assessed with clinical judgement   ADL Overall ADL's : Needs assistance/impaired   Eating/Feeding Details (indicate cue type and reason): encouraged PO intake                     Toilet Transfer: Maximal assistance;+2  for physical assistance;+2 for safety/equipment;Cueing for safety Toilet Transfer Details (indicate cue type and reason): sit to stand Toileting- Clothing Manipulation and Hygiene: Total assistance;Bed level       Functional mobility during ADLs: Maximal assistance;+2 for physical assistance;Caregiver able to provide necessary level of assistance General ADL Comments: Pt falling asleep throughout session, decreased ability to care for self and continues to  have R sided weakness/hemiplegia. Pt more alert this session using hand over hand techniques for PROM  towel slides on table attempting to wipe table. pt using wet wash cloth on table and trunk movement x3 times forward, backward and side to side sitting EOB facilitiating dynamic sitting balance. Pt continues to have posterior and L sided lean.     Vision   Vision Assessment?: Vision impaired- to be further tested in functional context Alignment/Gaze Preference: Head turned;Gaze left Tracking/Visual Pursuits: Decreased smoothness of eye movement to RIGHT superior field Visual Fields: Right visual field deficit Additional Comments: pt unable to scan environment during tasks, but mostly kept eyes closed.   Perception     Praxis      Cognition Arousal/Alertness: Awake/alert Behavior During Therapy: Flat affect Overall Cognitive Status: Impaired/Different from baseline Area of Impairment: Attention;Following commands;Problem solving                   Current Attention Level: Focused   Following Commands: Follows one step commands with increased time     Problem Solving: Decreased initiation;Requires verbal cues;Requires tactile cues General Comments: Pt gesturing at times; not mouthing words or trying to speak at this time. Pt nodding or shaking head.        Exercises Exercises: General Lower Extremity General Exercises - Upper Extremity Shoulder Flexion: PROM;Right;5 reps;Seated;Supine Shoulder Extension: PROM;Right;5 reps Elbow Flexion: PROM;Right;5 reps Elbow Extension: PROM;Right;5 reps Wrist Flexion: PROM;Right;5 reps Wrist Extension: PROM;Right;5 reps General Exercises - Lower Extremity Long Arc Quad: Left;10 reps;Seated;AAROM;Right;AROM Other Exercises Other Exercises: reaching tasks and truncal stability at EOB   Shoulder Instructions       General Comments 3L o2, VSS    Pertinent Vitals/ Pain       Pain Assessment: Faces Faces Pain Scale: Hurts little  more Pain Location: feet in standing Pain Descriptors / Indicators: Grimacing Pain Intervention(s): Monitored during session  Home Living                                          Prior Functioning/Environment              Frequency  Min 2X/week        Progress Toward Goals  OT Goals(current goals can now be found in the care plan section)  Progress towards OT goals: Progressing toward goals  Acute Rehab OT Goals Patient Stated Goal: pt's family wants pt to go to CIR OT Goal Formulation: With patient/family Time For Goal Achievement: 01/08/21 Potential to Achieve Goals: Good ADL Goals Pt Will Perform Grooming: with min assist;sitting Pt Will Transfer to Toilet: with max assist;squat pivot transfer;bedside commode Additional ADL Goal #1: Pt will maintain EOB sitting with minA while performing BADL task x10 mins Additional ADL Goal #2: Pt will locate ADL items on R side with minimal cues Additional ADL Goal #3: Pt will sustain attention to simple ADL task x5 mins with min cues.  Plan Discharge plan needs to be updated    Co-evaluation  PT/OT/SLP Co-Evaluation/Treatment: Yes Reason for Co-Treatment: Complexity of the patient's impairments (multi-system involvement);To address functional/ADL transfers PT goals addressed during session: Mobility/safety with mobility;Balance;Strengthening/ROM OT goals addressed during session: ADL's and self-care      AM-PAC OT "6 Clicks" Daily Activity     Outcome Measure   Help from another person eating meals?: A Lot Help from another person taking care of personal grooming?: A Lot Help from another person toileting, which includes using toliet, bedpan, or urinal?: Total Help from another person bathing (including washing, rinsing, drying)?: A Lot Help from another person to put on and taking off regular upper body clothing?: A Lot Help from another person to put on and taking off regular lower body clothing?:  Total 6 Click Score: 10    End of Session    OT Visit Diagnosis: Unsteadiness on feet (R26.81);Hemiplegia and hemiparesis;Cognitive communication deficit (R41.841) Symptoms and signs involving cognitive functions: Cerebral infarction Hemiplegia - Right/Left: Right Hemiplegia - dominant/non-dominant: Dominant Hemiplegia - caused by: Cerebral infarction   Activity Tolerance Patient tolerated treatment well   Patient Left in bed;with call bell/phone within reach;with bed alarm set;with family/visitor present   Nurse Communication Mobility status        Time: 1610-9604 OT Time Calculation (min): 45 min  Charges: OT General Charges $OT Visit: 1 Visit OT Treatments $Neuromuscular Re-education: 8-22 mins  Flora Lipps, OTR/L Acute Rehabilitation Services Pager: [phone] Office: [phone]    Larisa Lanius C 12/29/2020, 2:12 PM

## 2020-12-29 NOTE — Progress Notes (Addendum)
Calorie Count Note  48-hour calorie count ordered.  Diet: Dysphagia 1/Honey-thickened liquids Supplements: Magic Cup TID   Breakfast (7/2 and 7/4): 265 kcal, 8 grams protein (2 of 2 meals) Lunch (7/2): 303 kcal, 8 grams protein (1 of 2 meals) Dinner (7/2): 409 kcal, 10 grams protein (1 of 2 meals)  Only 2 breakfast tickets, 1 lunch, and 1 dinner were in the calorie count envelope.   Total intake: 977 kcal (42% of minimum estimated needs)  26 grams of protein (22% of minimum estimated needs)  Nutrition Dx: Inadequate oral intake related to inability to eat as evidenced by NPO status.  Goal: Patient will meet greater than or equal to 90% of their needs - Unmet  Intervention:  Family to feed pt meals by mouth. Placing PEG tube for long term nutrition may still be in pt's best interest. Once PEG is placed, start: Jevity 1.5 @ 30 ml/hr and increase by 10 ml every 6 hours to goal rate of 10ml/hr ( ) 68ml Prosource TF TID free water Q4H Provides 2460kcals, 132g protein, free water ( total free water with flushes)  Addendum: Spoke with pt's brother and sister at bedside. They report that he is eating much better if they are more "forceful" with him when he is being fed by them. They wish to try to feed him this way before considering nutrition support. RD is fine with this short term, but nutrition support will need to be considered again once the family goes back home to [place] if pt is unable to feed himself to meet his needs.   Spoke with RN. RN to document meal intakes in Epic to determine if pt is eating >75% of meals. Not continuing calorie count at this time. May need to consider this in the future, however.  RD still recommends Cortrak (or more likely PEG) for long-term nutrition without the need to "force" the patient to eat by mouth.  RD will continue to monitor.  Vertell Limber, RD, LDN (she/her/hers) Registered Dietitian I After-Hours/Weekend  Pager # in [place]

## 2020-12-29 NOTE — Progress Notes (Signed)
PROGRESS NOTE    Brandon Daniel  ZOX:[id number] DOB: 10/01/1950 DOA: 12/03/2020 PCP: Pcp, No    Brief Narrative:  70 year old male with a history of coronary artery disease, hypertension, hyperlipidemia, admitted to the hospital with shortness of breath.  He was found to be influenza positive, as well as having an elevated troponin.  The day after admission, he was noted to have acute change in neuro status with acute right-sided weakness.  He was found to have left MCA/ACA territory infarct.  Seen by neurology and received tPA.  On 6/10, he had worsening respiratory status requiring intubation.  He was noted to have bilateral lower lobe pneumonia. He was treated with antibiotics and lasix. Subsequently extubated on 6/17. Transferred to [facility] on 6/22. He had recurrence of fever and was found to have Klebsiella ESBL UTI and staph pneumonia. He is currently on Meropenem. He has significant dysphagia and currently receiving nutrition through cortrak. He was also noted to have RLE DVT and started on DOAC. Disposition would be to CIR vs. SNF vs. LTACH (his current insurance reportedly does not cover).  SLP cleared him for dysphagia 1 diet and honey thickened liquids on 6/28.  Core track fell out 7/2.  Trial of oral intake on the weekend.  Per RD, not adequate to sustain nutrition and recommends PEG.  Long discussion with patient's brother and sister, they feel that he is eating better/well since yesterday and wish to encourage oral intake, hold off on tube placement for now.  RD will follow.   Assessment & Plan:   Principal Problem:   Influenza A Active Problems:   NSTEMI (non-ST elevated myocardial infarction) (HCC)   CAD (coronary artery disease)   Asthma   Essential hypertension   Hyperlipidemia   Dyspnea   Pleural effusion on left   Stroke (cerebrum) (HCC)   Acute respiratory failure with hypoxia (HCC)   Pressure injury of skin   Left MCA/ACA territory stroke -he has dysphagia, aphasia and  right hemiplegia -Received tPA on 6/9 secondary to symptomatic high-grade proximal left carotid stenosis -Neurology evaluated patient and appear to have signed off. 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 -Recommendations were for medical management for now -He will need to follow-up with cardiology to be considered for further therapy, provided he makes further neurologic recovery  Acute respiratory failure with hypoxia -Secondary to pneumonia/left pleural effusion/increased respiratory secretions -He was previously treated with antivirals for influenza as well as completed a course of antibiotics -Repeat findings indicate possible new staph pneumonia -Continue chest physical therapy -Extubated 6/17.  As per RN report, overnight desaturated to 89-90% on 0.5 L/min [place] oxygen and hence placed back on oxygen.  Incentive spirometry.  Currently on 2 L/min [place] O2. -he is at high risk for recurrent aspiration -Repeat chest x-ray 6/30 without acute findings. -Had an episode of emesis night of 7/3, concern for recurrent aspiration although chest x-ray without new or acute  findings.  Procalcitonin 0.13.  Back on IV Unasyn, could consider switching to Augmentin in a.m. and finishing a 5-day course in total.  ESBL Klebsiella UTI -Completed course of antibiotics including  2 days of cefepime followed by 3 days of meropenem and finally a dose of fosfomycin on 6/29.  MSSA pneumonia/concern for aspiration pneumonia Completed course of antibiotics including 2 days of cefepime, 3 days of vancomycin, 3 days of meropenem. -Had an episode of emesis night of 7/3, concern for recurrent aspiration although chest x-ray without new or acute findings.  Procalcitonin 0.13.  Back on IV Unasyn.  RLE peroneal DVT -venous dopplers show DVT in RLE, below the knee that is age indeterminate -patient does not have any known prior history of DVT  -Previous hospitalist MD discussed with Dr. Vassie LollAlva, PCCM and it was felt reasonable to hold anticoagulation, monitor for now and repeat venous dopplers in 3-5 days -venous dopplers repeated on 6/28 that showed persistent DVT -since patient is immobile, he would be considered higher risk extension of DVT -will start on DOAC for 3 months -Previous hospitalist MD discussed with neurology Dr. Roda ShuttersXu, and it was recommended to discontinue plavix while on anticoagulation. He should continue on aspirin -once his anticoagulation course is complete, he should return to DAPT  AKI Resolved.  Prediabetes Vs Type 2 diabetes A1c 6.2.  Patient was on high doses of Lantus, mealtime NovoLog and SSI while on tube feeds.  Now that tube feeds are off, reduced all insulins due to risk of hypoglycemia.  Monitor closely.  Good control.  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  ST discussed extensively with RN regarding feeding techniques.  As per RN, oral intake improving. - Core track fell out 7/2.  Tried oral intake alone on the long July 4th weekend.  Per RD evaluation today, not adequate to sustain nutrition and recommends PEG.  Long discussion with patient's brother and sister, they feel that he is eating better/well since yesterday and wish to see how he does with their encouragement/supervision of increasing oral intake and  would like to hold off on tube placement for now.  RD will follow.  Urinary retention, recurrent Foley catheter had been removed, patient developed recurrent urinary retention requiring multiple in and out catheterizations.  Foley catheter placed back again on 7/2.  Cardura switched to Flomax.  Catheter to remain until follow-up with urology in approximately 2 weeks.  Hypertension -stable on metoprolol  Goals of care -seen by palliative care. Family has elected to continue with full scope of treatment  Diarrhea Much improved.  DC rectal tube.  Nonsustained SVT/VT: 2D echo 12/04/2020: LVEF 55-60%.  Check TSH: 0.833.  Try to keep potassium greater than 4 and magnesium greater than 2.    Abnormal LFTs: Improving.  Could have been due to tube feeds.  Periodically follow LFTs.  Hypokalemia: Replace and follow periodically..   DVT prophylaxis: eliquis apixaban (ELIQUIS) tablet 5 mg  Code Status: full code Family Communication: I discussed in detail with patient's brother and sister who have come from [place], at bedside along with the patient.  Updated care in detail and answered all questions.  Patient nodded understanding and agreement. Disposition Plan: Status is: Inpatient  Remains inpatient appropriate because:Ongoing diagnostic testing needed not appropriate for outpatient work up and Inpatient level of care appropriate due to severity of illness.  LTAC was denied by insurance.  Therapies now have changed recommendation to CIR-consulted.  Dispo: The patient  is from: Home              Anticipated d/c is to: CIR              Patient currently is not medically stable to d/c.   Difficult to place patient No    Consultants:  Neurology (signed off) PCCM (signed off) Cardiology (signed off) Palliative care medicine.  Procedures:  ETT 6/10>6/17 LHC 6/14  Antimicrobials:  Tamiflu completed 6/13 Azithromycin 6/11 > 6/14 Ceftriaxone 6/11 > 6/15  Vancomycin 6/24>  6/26 Cefepime 6/24 > 6/26 Meropenem 6/26>   Subjective: No new complaints.  As per siblings at bedside, patient has been eating much better since yesterday.  Advised them to get a iPad or similar device with he can communicate by typing with his left hand.  They liked this idea.  Objective: Vitals:   12/29/20 0500 12/29/20 0804 12/29/20 0833 12/29/20 1244  BP:   135/64 124/68  Pulse:  98 79 81  Resp:  16 18 18   Temp:   98.6 F (37 C) 97.9 F (36.6 C)  TempSrc:   Axillary Oral  SpO2:  98% 97% 97%  Weight: 95 kg     Height:        Intake/Output Summary (Last 24 hours) at 12/29/2020 1542 Last data filed at 12/29/2020 1502 Gross per 24 hour  Intake 2027.62 ml  Output 1000 ml  Net 1027.62 ml   Filed Weights   12/27/20 0616 12/28/20 0500 12/29/20 0500  Weight: 99.1 kg 93.7 kg 95 kg    Examination:  General exam: Middle-age male, moderately built and nourished lying comfortably propped up in bed. Respiratory system: Clear to auscultation.  No increased work of breathing. Cardiovascular system: S1 and S2 heard, RRR.  No JVD, murmurs or pedal edema. Gastrointestinal system: Abdomen is nondistended, soft and nontender. No organomegaly or masses felt. Normal bowel sounds heard. Central nervous system: Alert and oriented-communicates with head nods and waving with left upper extremity.  Nods appropriately indicating yes or no.  Waves with left hand.  Follows instructions.  Dense right hemiplegia i.e. 0/5 power.  Normal power left limbs.   Extremities: No C/C/E, +pedal pulses.  Has a right heel floater. Skin: No rashes, lesions or ulcers Psychiatry: unable to assess.  Flat affect. ENT: No NG tube any longer.    Data Reviewed: I have personally reviewed following labs and imaging studies  CBC: Recent Labs  Lab 12/23/20 0259 12/26/20 0647  WBC 5.8 6.5  HGB 10.2* 10.0*  HCT 33.5* 32.2*  MCV 91.0 89.0  PLT 345 250   Basic Metabolic Panel: Recent Labs  Lab 12/23/20 0259  12/23/20 1245 12/26/20 0647 12/28/20 0155  NA 141  --  136 135  K 4.2  --  3.4* 3.6  CL 106  --  100 101  CO2 28  --  28 28  GLUCOSE 220*  --  116* 134*  BUN 28*  --  21 15  CREATININE 0.59*  --  0.50* 0.48*  CALCIUM 8.4*  --  8.2* 8.2*  MG  --  2.1 1.9 1.9   GFR: Estimated Creatinine Clearance: 101.9 mL/min (A) (by C-G formula based on SCr of 0.48 mg/dL (L)). Liver Function Tests: Recent Labs  Lab 12/23/20 0259 12/26/20 0647 12/28/20 0155  AST 99* 62* 52*  ALT 130* 101* 83*  ALKPHOS 69 75 74  BILITOT 0.3 0.5 0.6  PROT 6.1* 6.1* 5.9*  ALBUMIN 2.0* 2.1* 2.1*    CBG: Recent  Labs  Lab 12/28/20 1153 12/28/20 1652 12/28/20 2103 12/29/20 0653 12/29/20 1240  GLUCAP 200* 162* 108* 131* 199*    No results found for this or any previous visit (from the past 240 hour(s)).        Radiology Studies: DG CHEST PORT 1 VIEW  Result Date: 12/27/2020 CLINICAL DATA:  Cough. EXAM: PORTABLE CHEST 1 VIEW COMPARISON:  Radiograph 3 days ago 12/24/2020, additional priors. FINDINGS: Post median sternotomy. Borderline cardiomegaly with stable mediastinal contours. Coronary stent is seen. Blunting of the left costophrenic angle similar to prior exam with residual streaky basilar opacity. No new consolidation. No pulmonary edema or pneumothorax no acute osseous abnormalities are seen. IMPRESSION: 1. Similar small left pleural effusion. 2. Persistent streaky opacities at the left lung base, favoring atelectasis or scarring. 3. Otherwise stable exam. Electronically Signed   By: Narda Rutherford M.D.   On: 12/27/2020 21:10        Scheduled Meds:  apixaban  5 mg Oral BID   arformoterol  15 mcg Nebulization BID   aspirin  81 mg Oral Daily   atorvastatin  80 mg Oral Daily   budesonide (PULMICORT) nebulizer solution  0.5 mg Nebulization BID   chlorhexidine  15 mL Mouth Rinse BID   Chlorhexidine Gluconate Cloth  6 each Topical Daily   ezetimibe  10 mg Oral Daily   gabapentin  300 mg Oral  BID   insulin aspart  0-5 Units Subcutaneous QHS   insulin aspart  0-9 Units Subcutaneous TID WC   insulin glargine  5 Units Subcutaneous BID   mouth rinse  15 mL Mouth Rinse q12n4p   metoprolol tartrate  25 mg Oral BID   montelukast  10 mg Oral QHS   multivitamin with minerals  1 tablet Oral Daily   pantoprazole sodium  40 mg Oral QHS   QUEtiapine  12.5 mg Oral QHS   sodium chloride flush  3 mL Intravenous Q12H   tamsulosin  0.4 mg Oral QPC supper   Continuous Infusions:  sodium chloride     sodium chloride 50 mL/hr at 12/28/20 2240   ampicillin-sulbactam (UNASYN) IV 3 g (12/29/20 1114)     LOS: 26 days    Time spent:   Marcellus Scott, MD, [place], [facility]. [facility]  To contact the attending provider between 7A-7P or the covering provider during after hours 7P-7A, please log into the web site [email or web address] and access using universal [place] password for that web site. If you do not have the password, please call the hospital operator.

## 2020-12-29 NOTE — Progress Notes (Signed)
Physical Therapy Treatment Patient Details Name: Brandon Daniel MRN: [id number] DOB: January 27, 1951 Today's Date: 12/29/2020    History of Present Illness Mr. Addiel Mccardle is a 70 y.o. male admitted 6/9 that  presented with SOB, was found to be Influenza A + and later on day of admission developed mild R facial droop, R arm drift and hand grip weakness along with R leg weakness. Left MCA and ACA CVA. Pt received tPa. VDRF 6/11-6/15. PMH: CAD, HTN, lumbar stenosis, HLD    PT Comments    Pt participating well with PT/OT today. Sat EOB >30 mins working on sitting balance, wt shifting, and hand over hand reaching activities. Pt required max A at time to maintain sitting due to posterior and/or R lean but was able to maintain short bouts of min A. Pt stood 2x with max A +2. Updating pt rec to CIR consult due to pt's increased engagement and family goals. PT will continue to follow.    Follow Up Recommendations  CIR     Equipment Recommendations  Other (comment) (TBD)    Recommendations for Other Services Rehab consult     Precautions / Restrictions Precautions Precautions: Fall;Other (comment) Precaution Comments: R hemi Restrictions Weight Bearing Restrictions: No    Mobility  Bed Mobility Overal bed mobility: Needs Assistance Bed Mobility: Supine to Sit;Sit to Supine Rolling: Min assist Sidelying to sit: Mod assist   Sit to supine: +2 for physical assistance;+2 for safety/equipment;Mod assist   General bed mobility comments: pt able to roll to R with min A and hold self in SL. Facilitation of L foot under R to assist with LE's off bed, mod A to complete. Mod HHA for elevation of trunk into sitting with pt having strong R lean initially. Pt able to lift LLE back into bed. Mod A +2 for mgmt of LLE and trunk. Pt able to pull self towards HOB once able to grab headboard with L hand.    Transfers Overall transfer level: Needs assistance   Transfers: Sit to/from Stand Sit to Stand:  +2 physical assistance;Max assist         General transfer comment: Pt stood 2x EOB with max A +2 for power up, PT/OT on each side of pt with pt's arms over therapists' shoulders. Pt winced and indicated foot pain with WB'ing, less the second time.  Ambulation/Gait             General Gait Details: unable   Stairs             Wheelchair Mobility    Modified Rankin (Stroke Patients Only) Modified Rankin (Stroke Patients Only) Pre-Morbid Rankin Score: No symptoms Modified Rankin: Severe disability     Balance Overall balance assessment: Needs assistance Sitting-balance support: No upper extremity supported;Single extremity supported;Feet supported Sitting balance-Leahy Scale: Poor Sitting balance - Comments: initially needed max A due to R lean, progressed to min at times when working on wt shifting but wuold return to needing max for posterior lean when fatigued. Postural control: Posterior lean;Right lateral lean Standing balance support: Bilateral upper extremity supported;During functional activity Standing balance-Leahy Scale: Zero Standing balance comment: max A +2 to maintain standing               High Level Balance Comments: worked on hand over hand reaching on tray table.            Cognition Arousal/Alertness: Awake/alert Behavior During Therapy: Flat affect Overall Cognitive Status: Impaired/Different from baseline Area of Impairment: Attention;Following commands;Problem  solving                   Current Attention Level: Focused   Following Commands: Follows one step commands with increased time     Problem Solving: Decreased initiation;Requires verbal cues;Requires tactile cues General Comments: Following one step commands, Shaking head yes/no to questions and attempting to make needs known with gestures.      Exercises General Exercises - Lower Extremity Long Arc Quad: Left;10 reps;Seated;AAROM;Right;AROM Other  Exercises Other Exercises: Lateral lean L to prevent pushing with L. After performing x1 min pt did not continue to push with L    General Comments General comments (skin integrity, edema, etc.): 3L O2, VSS      Pertinent Vitals/Pain Pain Assessment: Faces Faces Pain Scale: Hurts little more Pain Location: feet in standing Pain Descriptors / Indicators: Grimacing Pain Intervention(s): Limited activity within patient's tolerance;Monitored during session    Home Living                      Prior Function            PT Goals (current goals can now be found in the care plan section) Acute Rehab PT Goals Patient Stated Goal: not stated PT Goal Formulation: With family Time For Goal Achievement: 01/06/21 Potential to Achieve Goals: Fair Progress towards PT goals: Progressing toward goals    Frequency    Min 3X/week      PT Plan Discharge plan needs to be updated    Co-evaluation PT/OT/SLP Co-Evaluation/Treatment: Yes Reason for Co-Treatment: Complexity of the patient's impairments (multi-system involvement);Necessary to address cognition/behavior during functional activity;For patient/therapist safety PT goals addressed during session: Mobility/safety with mobility;Balance;Strengthening/ROM        AM-PAC PT "6 Clicks" Mobility   Outcome Measure  Help needed turning from your back to your side while in a flat bed without using bedrails?: A Lot Help needed moving from lying on your back to sitting on the side of a flat bed without using bedrails?: Total Help needed moving to and from a bed to a chair (including a wheelchair)?: Total Help needed standing up from a chair using your arms (e.g., wheelchair or bedside chair)?: Total Help needed to walk in hospital room?: Total Help needed climbing 3-5 steps with a railing? : Total 6 Click Score: 7    End of Session Equipment Utilized During Treatment: Oxygen Activity Tolerance: Patient limited by fatigue;Patient  tolerated treatment well Patient left: in bed;with call bell/phone within reach;with bed alarm set;with family/visitor present Nurse Communication: Mobility status PT Visit Diagnosis: Muscle weakness (generalized) (M62.81);Hemiplegia and hemiparesis Hemiplegia - Right/Left: Right Hemiplegia - dominant/non-dominant: Dominant Hemiplegia - caused by: Cerebral infarction     Time: 1020-1058 PT Time Calculation (min) (ACUTE ONLY): 38 min  Charges:  $Therapeutic Activity: 8-22 mins                     Lyanne Co, PT  Acute Rehab Services  Pager [phone] Office [phone]    Lawana Chambers Ansar Skoda 12/29/2020, 1:24 PM

## 2020-12-30 DIAGNOSIS — I472 Ventricular tachycardia: Secondary | ICD-10-CM

## 2020-12-30 DIAGNOSIS — I214 Non-ST elevation (NSTEMI) myocardial infarction: Secondary | ICD-10-CM | POA: Diagnosis not present

## 2020-12-30 DIAGNOSIS — J9601 Acute respiratory failure with hypoxia: Secondary | ICD-10-CM | POA: Diagnosis not present

## 2020-12-30 DIAGNOSIS — I25119 Atherosclerotic heart disease of native coronary artery with unspecified angina pectoris: Secondary | ICD-10-CM | POA: Diagnosis not present

## 2020-12-30 DIAGNOSIS — R1311 Dysphagia, oral phase: Secondary | ICD-10-CM

## 2020-12-30 DIAGNOSIS — G8191 Hemiplegia, unspecified affecting right dominant side: Secondary | ICD-10-CM

## 2020-12-30 DIAGNOSIS — J690 Pneumonitis due to inhalation of food and vomit: Secondary | ICD-10-CM

## 2020-12-30 DIAGNOSIS — I10 Essential (primary) hypertension: Secondary | ICD-10-CM | POA: Diagnosis not present

## 2020-12-30 LAB — COMPREHENSIVE METABOLIC PANEL
ALT: 68 U/L — ABNORMAL HIGH (ref 0–44)
AST: 49 U/L — ABNORMAL HIGH (ref 15–41)
Albumin: 1.9 g/dL — ABNORMAL LOW (ref 3.5–5.0)
Alkaline Phosphatase: 65 U/L (ref 38–126)
Anion gap: 4 — ABNORMAL LOW (ref 5–15)
BUN: 13 mg/dL (ref 8–23)
CO2: 31 mmol/L (ref 22–32)
Calcium: 8 mg/dL — ABNORMAL LOW (ref 8.9–10.3)
Chloride: 105 mmol/L (ref 98–111)
Creatinine, Ser: 0.6 mg/dL — ABNORMAL LOW (ref 0.61–1.24)
GFR, Estimated: >60 mL/min (ref >60)
Glucose, Bld: 138 mg/dL — ABNORMAL HIGH (ref 70–99)
Potassium: 3.9 mmol/L (ref 3.5–5.1)
Sodium: 140 mmol/L (ref 135–145)
Total Bilirubin: 0.5 mg/dL (ref 0.3–1.2)
Total Protein: 5.4 g/dL — ABNORMAL LOW (ref 6.5–8.1)

## 2020-12-30 LAB — CBC
HCT: 31.5 % — ABNORMAL LOW (ref 39.0–52.0)
Hemoglobin: 9.6 g/dL — ABNORMAL LOW (ref 13.0–17.0)
MCH: 27.7 pg (ref 26.0–34.0)
MCHC: 30.5 g/dL (ref 30.0–36.0)
MCV: 91 fL (ref 80.0–100.0)
Platelets: 199 10*3/uL (ref 150–400)
RBC: 3.46 MIL/uL — ABNORMAL LOW (ref 4.22–5.81)
RDW: 16.4 % — ABNORMAL HIGH (ref 11.5–15.5)
WBC: 3.5 10*3/uL — ABNORMAL LOW (ref 4.0–10.5)
nRBC: 0 % (ref 0.0–0.2)

## 2020-12-30 LAB — GLUCOSE, CAPILLARY
Glucose-Capillary: 127 mg/dL — ABNORMAL HIGH (ref 70–99)
Glucose-Capillary: 186 mg/dL — ABNORMAL HIGH (ref 70–99)
Glucose-Capillary: 156 mg/dL — ABNORMAL HIGH (ref 70–99)
Glucose-Capillary: 152 mg/dL — ABNORMAL HIGH (ref 70–99)

## 2020-12-30 LAB — PROCALCITONIN: Procalcitonin: <0.1 ng/mL

## 2020-12-30 LAB — MAGNESIUM: Magnesium: 2.1 mg/dL (ref 1.7–2.4)

## 2020-12-30 NOTE — Progress Notes (Signed)
PROGRESS NOTE  Brandon Daniel QIO:[id number] DOB: 1951/02/20   PCP: Pcp, No  Patient is from: Home  DOA: 12/03/2020 LOS: 27  Chief complaints:  Chief Complaint  Patient presents with   Shortness of Breath     Brief Narrative / Interim history: 70 year old M with PMH of CAD, HTN and HLD presenting with shortness of breath and admitted for influenza A infection and elevated troponin.  The next day, noted to have acute change in neuro status with acute right-sided weakness.  Work-up indicated left MCA and ACA territory infarct.  Received tPA.  On 6/10, developed respiratory failure requiring intubation, and found to have bilateral lower lobe pneumonia.  Treated with antibiotics and diuretics.  Subsequently extubated on 6/17 and transferred to [facility] service on 6/22.  Has had recurrence of fever and found to have Klebsiella ESBL UTI and staph pneumonia.  Started on meropenem.  Also found to have RLE DVT for which he was started on DOAC.  Has dysphagia and started on TF via cortrak.  However, cortrak fell out on 7/2.  Currently on trial of oral intake.  Calorie count started on 7/5.  Family thinks he is eating better and would like to hold off PEG tube.  Therapy recommended CIR but CIR suggesting SNF.  Subjective: Seen and examined earlier this morning.  No major events overnight of this morning.  Seems to have expressive aphasia.  He does not verbalize but responds to questions by nodding.  He follows commands.  Denies pain, shortness of breath, GI or UTI symptoms.  Objective: Vitals:   12/30/20 0819 12/30/20 0820 12/30/20 0856 12/30/20 1223  BP:   131/85 132/72  Pulse:   73 82  Resp:   18 18  Temp:   98.2 F (36.8 C) 98.3 F (36.8 C)  TempSrc:   Oral Axillary  SpO2: 98% 98% 99% 97%  Weight:      Height:        Intake/Output Summary (Last 24 hours) at 12/30/2020 1344 Last data filed at 12/30/2020 1000 Gross per 24 hour  Intake 819.85 ml  Output 1350 ml  Net -530.15 ml   Filed Weights    12/27/20 0616 12/28/20 0500 12/29/20 0500  Weight: 99.1 kg 93.7 kg 95 kg    Examination:  GENERAL: No apparent distress.  Nontoxic. HEENT: MMM.  Vision and hearing grossly intact.  NECK: Supple.  No apparent JVD.  RESP: 97% on 3 L.  No IWOB.  Fair aeration bilaterally. CVS:  RRR. Heart sounds normal.  ABD/GI/GU: BS+. Abd soft, NTND.  Indwelling Foley in place. MSK/EXT:  Moves extremities. No apparent deformity. No edema.  SKIN: no apparent skin lesion or wound NEURO: Awake, alert and oriented appropriately.  Expressive aphasia.  Right-sided hemiparesis. PSYCH: Calm. Normal affect.   Procedures:  Intubation and extubation  Microbiology summarized: 6/9-influenza A PCR positive. 6/9-COVID-19 PCR and MRSA PCR negative. 6/24-urine culture with ESBL Klebsiella pneumonia 6/23-respiratory culture with staph aureus 6/24-blood cultures NGTD   Assessment & Plan: Left MCA/ACA territory stroke-symptoms include dysphagia, aphasia and right hemiparesis. Symptomatic severe left internal carotid artery stenosis -TTE with LVEF of 55 to 60%.  LDL 55.  A1c 6.2%. -s/p tPA on 6/9 -Received tPA on 6/9 secondary to symptomatic high-grade proximal left carotid stenosis -On Eliquis and aspirin.  Neurology recommended DAPT once he completes Eliquis course for DVT -Outpatient follow-up with neurology -Neuro interventional radiology recommended outpatient follow-up for ICA stenting   Non-STEMI: Noted to have new ST depressions in V6 and  worsening T wave inversions in inferior leads.  Seen by cardiology and underwent left heart cath on 6/14 that showed multivessel disease. -Medical management per cardiology -Outpatient follow-up with cardiology   Acute respiratory failure with hypoxia-multifactorial-influenza A pneumonia (resolved), staph pneumonia and aspiration in the setting of dysphagia -Treat treatable causes -Wean oxygen as able. -Aspiration precaution -Complete course for aspiration  pneumonia with Augmentin.  Dysphagia due to CVA-core track fell off on 7/2.  Currently on dysphagia 1 diet. -Calorie count underway on 7/5. -Family would like to hold off PEG tube   ESBL Klebsiella UTI -Completed course with 2 days of cefepime, 3 days of meropenem and fosfomycin on 6/29    RLE peroneal DVT-age-indeterminate.   -Decision is made to treat given high risk for extension due to immobility -once his anticoagulation course is complete, he should return to DAPT   Prediabetes: A1c 6.2%.  Does not seem to be on medication at home. Recent Labs  Lab 12/29/20 1240 12/29/20 1616 12/29/20 2139 12/30/20 0619 12/30/20 1227  GLUCAP 199* 160* 159* 127* 186*  -Continue current insulin regimen and a statin.  Urinary retention, recurrent: Failed multiple voiding trials.  Foley catheter placed back again on 7/2.   -Continue Flomax -Outpatient follow-up with urology for voiding trial   Hypertension/NSVT: TTE with LVEF 55-60%.  Check TSH: 0.833. : Normotensive. -Continue metoprolol -Optimize electrolytes   AKI: Resolved  Goals of care -seen by palliative care. Family has elected to continue with full scope of treatment   Diarrhea: Resolved  Abnormal LFTs: Improved -Check LFT periodically   Hypokalemia: Resolved  Nutrition: Body mass index is 30.91 kg/m. Nutrition Problem: Inadequate oral intake Etiology: inability to eat Signs/Symptoms: NPO status Interventions: Tube feeding, Prostat  Pressure skin injury Pressure Injury 12/23/20 Nare Left;Lower Stage 2 -  Partial thickness loss of dermis presenting as a shallow open injury with a red, pink wound bed without slough. Injury to the left nare where cortrak tie made contact with the skin. (Active)  12/23/20 1800  Location: Nare  Location Orientation: Left;Lower  Staging: Stage 2 -  Partial thickness loss of dermis presenting as a shallow open injury with a red, pink wound bed without slough.  Wound Description  (Comments): Injury to the left nare where cortrak tie made contact with the skin.  Present on Admission: No   DVT prophylaxis:   apixaban (ELIQUIS) tablet 5 mg  Code Status: Full code Family Communication: Patient and/or RN. Available if any question.  Level of care: Med-Surg Status is: Inpatient  Remains inpatient appropriate because:Unsafe d/c plan, Inpatient level of care appropriate due to severity of illness, and reliable means of feeding if he does not maintain adequate caloric intake orally  Dispo: The patient is from: Home              Anticipated d/c is to: SNF              Patient currently is not medically stable to d/c.   Difficult to place patient No       Consultants:  Neurology   Sch Meds:  Scheduled Meds:  apixaban  5 mg Oral BID   arformoterol  15 mcg Nebulization BID   aspirin  81 mg Oral Daily   atorvastatin  80 mg Oral Daily   budesonide (PULMICORT) nebulizer solution  0.5 mg Nebulization BID   chlorhexidine  15 mL Mouth Rinse BID   Chlorhexidine Gluconate Cloth  6 each Topical Daily   ezetimibe  10 mg  Oral Daily   gabapentin  300 mg Oral BID   insulin aspart  0-5 Units Subcutaneous QHS   insulin aspart  0-9 Units Subcutaneous TID WC   insulin glargine  5 Units Subcutaneous BID   mouth rinse  15 mL Mouth Rinse q12n4p   metoprolol tartrate  25 mg Oral BID   montelukast  10 mg Oral QHS   multivitamin with minerals  1 tablet Oral Daily   pantoprazole sodium  40 mg Oral QHS   QUEtiapine  12.5 mg Oral QHS   sodium chloride flush  3 mL Intravenous Q12H   tamsulosin  0.4 mg Oral QPC supper   Continuous Infusions:  sodium chloride     sodium chloride 50 mL/hr at 12/28/20 2240   ampicillin-sulbactam (UNASYN) IV 3 g (12/30/20 1237)   PRN Meds:.sodium chloride, acetaminophen, albuterol, food thickener, guaiFENesin-dextromethorphan, nitroGLYCERIN, ondansetron (ZOFRAN) IV, senna-docusate, sodium chloride flush  Antimicrobials: Anti-infectives (From  admission, onward)    Start     Dose/Rate Route Frequency Ordered Stop   12/27/20 2300  Ampicillin-Sulbactam (UNASYN) 3 g in sodium chloride 0.9 % 100 mL IVPB        3 g 200 mL/hr over 30 Minutes Intravenous Every 6 hours 12/27/20 2121     12/23/20 1500  fosfomycin (MONUROL) packet 3 g        3 g Oral  Once 12/23/20 1314 12/23/20 1755   12/20/20 1400  meropenem (MERREM) 1 g in sodium chloride 0.9 % 100 mL IVPB  Status:  Discontinued        1 g 200 mL/hr over 30 Minutes Intravenous Every 8 hours 12/20/20 1123 12/23/20 1314   12/18/20 1100  vancomycin (VANCOREADY) IVPB 1750 mg/350 mL  Status:  Discontinued        1,750 mg 175 mL/hr over 120 Minutes Intravenous Every 24 hours 12/18/20 1002 12/20/20 1626   12/18/20 1045  ceFEPIme (MAXIPIME) 2 g in sodium chloride 0.9 % 100 mL IVPB  Status:  Discontinued        2 g 200 mL/hr over 30 Minutes Intravenous Every 8 hours 12/18/20 0958 12/20/20 1051   12/09/20 0000  cefTRIAXone (ROCEPHIN) 2 g in sodium chloride 0.9 % 100 mL IVPB        2 g 200 mL/hr over 30 Minutes Intravenous Every 24 hours 12/08/20 1044 12/10/20 0003   12/06/20 0000  cefTRIAXone (ROCEPHIN) 2 g in sodium chloride 0.9 % 100 mL IVPB  Status:  Discontinued        2 g 200 mL/hr over 30 Minutes Intravenous Every 24 hours 12/05/20 0033 12/08/20 1044   12/05/20 1000  oseltamivir (TAMIFLU) 6 MG/ML suspension 75 mg        75 mg Per Tube 2 times daily 12/05/20 0755 12/09/20 2157   12/05/20 0200  azithromycin (ZITHROMAX) 500 mg in sodium chloride 0.9 % 250 mL IVPB  Status:  Discontinued        500 mg 250 mL/hr over 60 Minutes Intravenous Every 24 hours 12/05/20 0033 12/08/20 1004   12/05/20 0130  cefTRIAXone (ROCEPHIN) 2 g in sodium chloride 0.9 % 100 mL IVPB        2 g 200 mL/hr over 30 Minutes Intravenous  Once 12/05/20 0035 12/05/20 0145   12/03/20 1030  oseltamivir (TAMIFLU) capsule 75 mg  Status:  Discontinued        75 mg Oral 2 times daily 12/03/20 0929 12/05/20 0755        I  have personally reviewed  the following labs and images: CBC: Recent Labs  Lab 12/26/20 0647 12/30/20 0217  WBC 6.5 3.5*  HGB 10.0* 9.6*  HCT 32.2* 31.5*  MCV 89.0 91.0  PLT 250 199   BMP &GFR Recent Labs  Lab 12/26/20 0647 12/28/20 0155 12/30/20 0217  NA 136 135 140  K 3.4* 3.6 3.9  CL 100 101 105  CO2 28 28 31   GLUCOSE 116* 134* 138*  BUN 21 15 13   CREATININE 0.50* 0.48* 0.60*  CALCIUM 8.2* 8.2* 8.0*  MG 1.9 1.9 2.1   Estimated Creatinine Clearance: 101.9 mL/min (A) (by C-G formula based on SCr of 0.6 mg/dL (L)). Liver & Pancreas: Recent Labs  Lab 12/26/20 0647 12/28/20 0155 12/30/20 0217  AST 62* 52* 49*  ALT 101* 83* 68*  ALKPHOS 75 74 65  BILITOT 0.5 0.6 0.5  PROT 6.1* 5.9* 5.4*  ALBUMIN 2.1* 2.1* 1.9*   No results for input(s): LIPASE, AMYLASE in the last 168 hours. No results for input(s): AMMONIA in the last 168 hours. Diabetic: No results for input(s): HGBA1C in the last 72 hours. Recent Labs  Lab 12/29/20 1240 12/29/20 1616 12/29/20 2139 12/30/20 0619 12/30/20 1227  GLUCAP 199* 160* 159* 127* 186*   Cardiac Enzymes: No results for input(s): CKTOTAL, CKMB, CKMBINDEX, TROPONINI in the last 168 hours. No results for input(s): PROBNP in the last 8760 hours. Coagulation Profile: No results for input(s): INR, PROTIME in the last 168 hours. Thyroid Function Tests: No results for input(s): TSH, T4TOTAL, FREET4, T3FREE, THYROIDAB in the last 72 hours. Lipid Profile: No results for input(s): CHOL, HDL, LDLCALC, TRIG, CHOLHDL, LDLDIRECT in the last 72 hours. Anemia Panel: No results for input(s): VITAMINB12, FOLATE, FERRITIN, TIBC, IRON, RETICCTPCT in the last 72 hours. Urine analysis:    Component Value Date/Time   COLORURINE YELLOW 12/18/2020 0835   APPEARANCEUR CLEAR 12/18/2020 0835   LABSPEC 1.020 12/18/2020 0835   PHURINE 6.0 12/18/2020 0835   GLUCOSEU NEGATIVE 12/18/2020 0835   HGBUR LARGE (A) 12/18/2020 0835   BILIRUBINUR NEGATIVE  12/18/2020 0835   KETONESUR NEGATIVE 12/18/2020 0835   PROTEINUR 100 (A) 12/18/2020 0835   NITRITE POSITIVE (A) 12/18/2020 0835   LEUKOCYTESUR SMALL (A) 12/18/2020 0835   Sepsis Labs: Invalid input(s): PROCALCITONIN, LACTICIDVEN  Microbiology: No results found for this or any previous visit (from the past 240 hour(s)).  Radiology Studies: No results found.    Dany Harten T. Nicolet Griffy [facility]  If 7PM-7AM, please contact night-coverage [email or web address] 12/30/2020, 1:44 PM

## 2020-12-31 DIAGNOSIS — J9601 Acute respiratory failure with hypoxia: Secondary | ICD-10-CM | POA: Diagnosis not present

## 2020-12-31 DIAGNOSIS — I10 Essential (primary) hypertension: Secondary | ICD-10-CM | POA: Diagnosis not present

## 2020-12-31 DIAGNOSIS — J101 Influenza due to other identified influenza virus with other respiratory manifestations: Secondary | ICD-10-CM | POA: Diagnosis not present

## 2020-12-31 DIAGNOSIS — I25119 Atherosclerotic heart disease of native coronary artery with unspecified angina pectoris: Secondary | ICD-10-CM | POA: Diagnosis not present

## 2020-12-31 LAB — MAGNESIUM: Magnesium: 2 mg/dL (ref 1.7–2.4)

## 2020-12-31 LAB — COMPREHENSIVE METABOLIC PANEL
ALT: 67 U/L — ABNORMAL HIGH (ref 0–44)
AST: 48 U/L — ABNORMAL HIGH (ref 15–41)
Albumin: 2 g/dL — ABNORMAL LOW (ref 3.5–5.0)
Alkaline Phosphatase: 68 U/L (ref 38–126)
Anion gap: 6 (ref 5–15)
BUN: 13 mg/dL (ref 8–23)
CO2: 29 mmol/L (ref 22–32)
Calcium: 8 mg/dL — ABNORMAL LOW (ref 8.9–10.3)
Chloride: 103 mmol/L (ref 98–111)
Creatinine, Ser: 0.5 mg/dL — ABNORMAL LOW (ref 0.61–1.24)
GFR, Estimated: >60 mL/min (ref >60)
Glucose, Bld: 138 mg/dL — ABNORMAL HIGH (ref 70–99)
Potassium: 3.9 mmol/L (ref 3.5–5.1)
Sodium: 138 mmol/L (ref 135–145)
Total Bilirubin: 0.6 mg/dL (ref 0.3–1.2)
Total Protein: 5.3 g/dL — ABNORMAL LOW (ref 6.5–8.1)

## 2020-12-31 LAB — CBC
HCT: 31.4 % — ABNORMAL LOW (ref 39.0–52.0)
Hemoglobin: 9.7 g/dL — ABNORMAL LOW (ref 13.0–17.0)
MCH: 27.7 pg (ref 26.0–34.0)
MCHC: 30.9 g/dL (ref 30.0–36.0)
MCV: 89.7 fL (ref 80.0–100.0)
Platelets: 201 10*3/uL (ref 150–400)
RBC: 3.5 MIL/uL — ABNORMAL LOW (ref 4.22–5.81)
RDW: 16.2 % — ABNORMAL HIGH (ref 11.5–15.5)
WBC: 3.8 10*3/uL — ABNORMAL LOW (ref 4.0–10.5)
nRBC: 0 % (ref 0.0–0.2)

## 2020-12-31 LAB — GLUCOSE, CAPILLARY
Glucose-Capillary: 131 mg/dL — ABNORMAL HIGH (ref 70–99)
Glucose-Capillary: 148 mg/dL — ABNORMAL HIGH (ref 70–99)
Glucose-Capillary: 126 mg/dL — ABNORMAL HIGH (ref 70–99)
Glucose-Capillary: 159 mg/dL — ABNORMAL HIGH (ref 70–99)
Glucose-Capillary: 173 mg/dL — ABNORMAL HIGH (ref 70–99)

## 2020-12-31 LAB — PHOSPHORUS: Phosphorus: 3.1 mg/dL (ref 2.5–4.6)

## 2020-12-31 NOTE — Progress Notes (Signed)
PROGRESS NOTE  Brandon Daniel GYF:[id number] DOB: 01/06/1951   PCP: Pcp, No  Patient is from: Home  DOA: 12/03/2020 LOS: 28  Chief complaints:  Chief Complaint  Patient presents with   Shortness of Breath     Brief Narrative / Interim history: 70 year old M with PMH of CAD, HTN and HLD presenting with shortness of breath and admitted for influenza A infection and elevated troponin.  The next day, noted to have acute change in neuro status with acute right-sided weakness.  Work-up indicated left MCA and ACA territory infarct.  Received tPA.  On 6/10, developed respiratory failure requiring intubation, and found to have bilateral lower lobe pneumonia.  Treated with antibiotics and diuretics.  Subsequently extubated on 6/17 and transferred to [facility] service on 6/22.  Has had recurrence of fever and found to have Klebsiella ESBL UTI and staph pneumonia.  Started on meropenem.  Also found to have RLE DVT for which he was started on DOAC.  Has dysphagia and started on TF via cortrak.  However, cortrak fell out on 7/2.  Currently on trial of oral intake.  Calorie count started on 7/5.  Family thinks he is eating better and would like to hold off PEG tube.  Therapy recommended CIR but CIR suggesting SNF.  Subjective: Seen and examined earlier this morning.  No major events overnight of this morning.  Awake and alert.  Seems to be fairly oriented but not verbal due to expressive aphasia.  Follows commands.  He notes no to pain.   Objective: Vitals:   12/30/20 2333 12/31/20 0354 12/31/20 0900 12/31/20 0941  BP: 130/69 (!) 146/86 (!) 141/71   Pulse: 78 91 82   Resp: 18 20 18    Temp: 98.2 F (36.8 C) 98.2 F (36.8 C) 97.9 F (36.6 C)   TempSrc: Oral Oral Oral   SpO2: 100% 94% 100% 97%  Weight:      Height:        Intake/Output Summary (Last 24 hours) at 12/31/2020 1152 Last data filed at 12/31/2020 1000 Gross per 24 hour  Intake 480 ml  Output 1700 ml  Net -1220 ml   Filed Weights   12/27/20  0616 12/28/20 0500 12/29/20 0500  Weight: 99.1 kg 93.7 kg 95 kg    Examination:  GENERAL: No apparent distress.  Nontoxic. HEENT: MMM.  Vision and hearing grossly intact.  NECK: Supple.  No apparent JVD.  RESP: 94% on 3 L.  No IWOB.  Fair aeration bilaterally. CVS:  RRR. Heart sounds normal.  ABD/GI/GU: BS+. Abd soft, NTND.  Indwelling Foley in place. MSK/EXT:  Moves extremities. No apparent deformity. No edema.  SKIN: no apparent skin lesion or wound NEURO: Awake and alert.  Follows commands.  Expressive aphasia.  Right hemiparesis. PSYCH: Calm. Normal affect.   Procedures:  Intubation and extubation  Microbiology summarized: 6/9-influenza A PCR positive. 6/9-COVID-19 PCR and MRSA PCR negative. 6/24-urine culture with ESBL Klebsiella pneumonia 6/23-respiratory culture with staph aureus 6/24-blood cultures NGTD   Assessment & Plan: Left MCA/ACA territory stroke-symptoms include dysphagia, aphasia and right hemiparesis. 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  LDL 55.  A1c 6.2%. -On Eliquis and aspirin.  Neurology recommended DAPT once he completes Eliquis course for DVT -Outpatient follow-up with neurology -Neuro interventional radiology recommended outpatient follow-up for ICA stenting   Non-STEMI: Noted to have new ST depressions in V6 and worsening T wave inversions in inferior leads.  Seen by  cardiology and underwent left heart cath on 6/14 that showed multivessel disease. -Cardiology recommended medical management and outpatient follow-up   Acute respiratory failure with hypoxia-multifactorial-influenza A pneumonia (resolved), staph pneumonia and aspiration in the setting of dysphagia -Treat treatable causes -Wean oxygen as able. -Aspiration precaution -Complete course for aspiration pneumonia with Augmentin.  Dysphagia due to CVA-cortrack fell off on 7/2.  Currently on dysphagia 1 diet. -Calorie count  stopped.  Dietitian recommending charting percent of meal eaten but PO intake remians poor -Family would like to hold off PEG tube   ESBL Klebsiella UTI -Completed course with 2 days of cefepime, 3 days of meropenem and fosfomycin on 6/29    RLE peroneal DVT-age-indeterminate.   -Decision is made to treat given high risk for extension due to immobility -once his anticoagulation course is complete, he should return to DAPT   Prediabetes: A1c 6.2%.  Does not seem to be on medication at home. Recent Labs  Lab 12/30/20 1227 12/30/20 1641 12/30/20 2115 12/31/20 0620 12/31/20 0628  GLUCAP 186* 156* 152* 131* 148*  -Continue current insulin regimen and a statin.  Urinary retention, recurrent: Failed multiple voiding trials.  Foley catheter placed back again on 7/2.   -Continue Flomax -Outpatient follow-up with urology for voiding trial   Hypertension/NSVT: TTE with LVEF 55-60%.  Check TSH: 0.833. : Normotensive. -Continue metoprolol -Optimize electrolytes   AKI: Resolved  Goals of care -seen by palliative care. Family has elected to continue with full scope of treatment   Diarrhea: Resolved  Abnormal LFTs: Improved -Check LFT periodically   Hypokalemia: Resolved  Nutrition: Body mass index is 30.91 kg/m. Nutrition Problem: Inadequate oral intake Etiology: inability to eat Signs/Symptoms: NPO status Interventions: Tube feeding, Prostat  Pressure skin injury Pressure Injury 12/23/20 Nare Left;Lower Stage 2 -  Partial thickness loss of dermis presenting as a shallow open injury with a red, pink wound bed without slough. Injury to the left nare where cortrak tie made contact with the skin. (Active)  12/23/20 1800  Location: Nare  Location Orientation: Left;Lower  Staging: Stage 2 -  Partial thickness loss of dermis presenting as a shallow open injury with a red, pink wound bed without slough.  Wound Description (Comments): Injury to the left nare where cortrak tie made  contact with the skin.  Present on Admission: No   DVT prophylaxis:   apixaban (ELIQUIS) tablet 5 mg  Code Status: Full code Family Communication: Patient and/or RN.  Updated patient's nephew, Jeralyn Ruths over the phone Level of care: Med-Surg Status is: Inpatient  Remains inpatient appropriate because:Unsafe d/c plan, Inpatient level of care appropriate due to severity of illness, and reliable means of feeding if he does not maintain adequate caloric intake orally  Dispo: The patient is from: Home              Anticipated d/c is to: SNF              Patient currently is not medically stable to d/c.   Difficult to place patient No       Consultants:  Neurology   Sch Meds:  Scheduled Meds:  apixaban  5 mg Oral BID   arformoterol  15 mcg Nebulization BID   aspirin  81 mg Oral Daily   atorvastatin  80 mg Oral Daily   budesonide (PULMICORT) nebulizer solution  0.5 mg Nebulization BID   chlorhexidine  15 mL Mouth Rinse BID   Chlorhexidine Gluconate Cloth  6 each Topical Daily   ezetimibe  10 mg Oral Daily   gabapentin  300 mg Oral BID   insulin aspart  0-5 Units Subcutaneous QHS   insulin aspart  0-9 Units Subcutaneous TID WC   insulin glargine  5 Units Subcutaneous BID   mouth rinse  15 mL Mouth Rinse q12n4p   metoprolol tartrate  25 mg Oral BID   montelukast  10 mg Oral QHS   multivitamin with minerals  1 tablet Oral Daily   pantoprazole sodium  40 mg Oral QHS   QUEtiapine  12.5 mg Oral QHS   sodium chloride flush  3 mL Intravenous Q12H   tamsulosin  0.4 mg Oral QPC supper   Continuous Infusions:  sodium chloride     sodium chloride 50 mL/hr at 12/28/20 2240   ampicillin-sulbactam (UNASYN) IV 3 g (12/31/20 1152)   PRN Meds:.sodium chloride, acetaminophen, albuterol, food thickener, guaiFENesin-dextromethorphan, nitroGLYCERIN, ondansetron (ZOFRAN) IV, senna-docusate, sodium chloride flush  Antimicrobials: Anti-infectives (From admission, onward)    Start      Dose/Rate Route Frequency Ordered Stop   12/27/20 2300  Ampicillin-Sulbactam (UNASYN) 3 g in sodium chloride 0.9 % 100 mL IVPB        3 g 200 mL/hr over 30 Minutes Intravenous Every 6 hours 12/27/20 2121     12/23/20 1500  fosfomycin (MONUROL) packet 3 g        3 g Oral  Once 12/23/20 1314 12/23/20 1755   12/20/20 1400  meropenem (MERREM) 1 g in sodium chloride 0.9 % 100 mL IVPB  Status:  Discontinued        1 g 200 mL/hr over 30 Minutes Intravenous Every 8 hours 12/20/20 1123 12/23/20 1314   12/18/20 1100  vancomycin (VANCOREADY) IVPB 1750 mg/350 mL  Status:  Discontinued        1,750 mg 175 mL/hr over 120 Minutes Intravenous Every 24 hours 12/18/20 1002 12/20/20 1626   12/18/20 1045  ceFEPIme (MAXIPIME) 2 g in sodium chloride 0.9 % 100 mL IVPB  Status:  Discontinued        2 g 200 mL/hr over 30 Minutes Intravenous Every 8 hours 12/18/20 0958 12/20/20 1051   12/09/20 0000  cefTRIAXone (ROCEPHIN) 2 g in sodium chloride 0.9 % 100 mL IVPB        2 g 200 mL/hr over 30 Minutes Intravenous Every 24 hours 12/08/20 1044 12/10/20 0003   12/06/20 0000  cefTRIAXone (ROCEPHIN) 2 g in sodium chloride 0.9 % 100 mL IVPB  Status:  Discontinued        2 g 200 mL/hr over 30 Minutes Intravenous Every 24 hours 12/05/20 0033 12/08/20 1044   12/05/20 1000  oseltamivir (TAMIFLU) 6 MG/ML suspension 75 mg        75 mg Per Tube 2 times daily 12/05/20 0755 12/09/20 2157   12/05/20 0200  azithromycin (ZITHROMAX) 500 mg in sodium chloride 0.9 % 250 mL IVPB  Status:  Discontinued        500 mg 250 mL/hr over 60 Minutes Intravenous Every 24 hours 12/05/20 0033 12/08/20 1004   12/05/20 0130  cefTRIAXone (ROCEPHIN) 2 g in sodium chloride 0.9 % 100 mL IVPB        2 g 200 mL/hr over 30 Minutes Intravenous  Once 12/05/20 0035 12/05/20 0145   12/03/20 1030  oseltamivir (TAMIFLU) capsule 75 mg  Status:  Discontinued        75 mg Oral 2 times daily 12/03/20 0929 12/05/20 0755        I have personally  reviewed the  following labs and images: CBC: Recent Labs  Lab 12/26/20 0647 12/30/20 0217 12/31/20 0057  WBC 6.5 3.5* 3.8*  HGB 10.0* 9.6* 9.7*  HCT 32.2* 31.5* 31.4*  MCV 89.0 91.0 89.7  PLT 250 199 201   BMP &GFR Recent Labs  Lab 12/26/20 0647 12/28/20 0155 12/30/20 0217 12/31/20 0057  NA 136 135 140 138  K 3.4* 3.6 3.9 3.9  CL 100 101 105 103  CO2 28 28 31 29   GLUCOSE 116* 134* 138* 138*  BUN 21 15 13 13   CREATININE 0.50* 0.48* 0.60* 0.50*  CALCIUM 8.2* 8.2* 8.0* 8.0*  MG 1.9 1.9 2.1 2.0  PHOS  --   --   --  3.1   Estimated Creatinine Clearance: 101.9 mL/min (A) (by C-G formula based on SCr of 0.5 mg/dL (L)). Liver & Pancreas: Recent Labs  Lab 12/26/20 0647 12/28/20 0155 12/30/20 0217 12/31/20 0057  AST 62* 52* 49* 48*  ALT 101* 83* 68* 67*  ALKPHOS 75 74 65 68  BILITOT 0.5 0.6 0.5 0.6  PROT 6.1* 5.9* 5.4* 5.3*  ALBUMIN 2.1* 2.1* 1.9* 2.0*   No results for input(s): LIPASE, AMYLASE in the last 168 hours. No results for input(s): AMMONIA in the last 168 hours. Diabetic: No results for input(s): HGBA1C in the last 72 hours. Recent Labs  Lab 12/30/20 1227 12/30/20 1641 12/30/20 2115 12/31/20 0620 12/31/20 0628  GLUCAP 186* 156* 152* 131* 148*   Cardiac Enzymes: No results for input(s): CKTOTAL, CKMB, CKMBINDEX, TROPONINI in the last 168 hours. No results for input(s): PROBNP in the last 8760 hours. Coagulation Profile: No results for input(s): INR, PROTIME in the last 168 hours. Thyroid Function Tests: No results for input(s): TSH, T4TOTAL, FREET4, T3FREE, THYROIDAB in the last 72 hours. Lipid Profile: No results for input(s): CHOL, HDL, LDLCALC, TRIG, CHOLHDL, LDLDIRECT in the last 72 hours. Anemia Panel: No results for input(s): VITAMINB12, FOLATE, FERRITIN, TIBC, IRON, RETICCTPCT in the last 72 hours. Urine analysis:    Component Value Date/Time   COLORURINE YELLOW 12/18/2020 0835   APPEARANCEUR CLEAR 12/18/2020 0835   LABSPEC 1.020 12/18/2020 0835    PHURINE 6.0 12/18/2020 0835   GLUCOSEU NEGATIVE 12/18/2020 0835   HGBUR LARGE (A) 12/18/2020 0835   BILIRUBINUR NEGATIVE 12/18/2020 0835   KETONESUR NEGATIVE 12/18/2020 0835   PROTEINUR 100 (A) 12/18/2020 0835   NITRITE POSITIVE (A) 12/18/2020 0835   LEUKOCYTESUR SMALL (A) 12/18/2020 0835   Sepsis Labs: Invalid input(s): PROCALCITONIN, LACTICIDVEN  Microbiology: No results found for this or any previous visit (from the past 240 hour(s)).  Radiology Studies: No results found.    Brandon Kidd T. Brandon Daniel [facility]  If 7PM-7AM, please contact night-coverage [email or web address] 12/31/2020, 11:52 AM

## 2020-12-31 NOTE — TOC Progression Note (Signed)
Transition of Care [facility]) - Progression Note    Patient Details  Name: [name] MRN: [id number] Date of Birth: 1951-01-07  [facility] of [facility] CM/SW Contact  Kermit Balo, RN Phone Number: 12/31/2020, 2:41 PM  Clinical Narrative:    Patients brother and sister in law have toured [facility] and [address] They are wanting [facility] but the facilities has no open beds. Brother updated. He plans on touring [facility]/ [facility] and [facility] tomorrow.  TOC following.   Expected Discharge Plan: Long Term Acute Care (LTAC) Barriers to Discharge: Continued Medical Work up  Expected Discharge Plan and Services Expected Discharge Plan: Long Term Acute Care (LTAC) In-house Referral: Clinical Social Work   Post Acute Care Choice: Long Term Acute Care (LTAC) Living arrangements for the past 2 months: Single Family Home                                       Social Determinants of Health (SDOH) Interventions    Readmission Risk Interventions No flowsheet data found.
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
## 2020-12-31 NOTE — Progress Notes (Signed)
Physical Therapy Treatment Patient Details Name: Nason Conradt MRN: [id number] DOB: 09-24-1950 Today's Date: 12/31/2020    History of Present Illness Mr. Enrico Eaddy is a 70 y.o. male admitted 6/9 that  presented with SOB, was found to be Influenza A + and later on day of admission developed mild R facial droop, R arm drift and hand grip weakness along with R leg weakness. Left MCA and ACA CVA. Pt received tPa. VDRF 6/11-6/15. PMH: CAD, HTN, lumbar stenosis, HLD    PT Comments    Pt continues to require heavy physical assistance of two people for functional mobility secondary to significant R sided weakness. Per CIR AC note from 7/5, family wishes to pursue SNF placement at this time. Pt would continue to benefit from skilled physical therapy services at this time while admitted and after d/c to address the below listed limitations in order to improve overall safety and independence with functional mobility.    Follow Up Recommendations  SNF;Other (comment) (per family request)     Equipment Recommendations  Other (comment) (defer to next venue of care)    Recommendations for Other Services       Precautions / Restrictions Precautions Precautions: Fall;Other (comment) Precaution Comments: R hemi Restrictions Weight Bearing Restrictions: No    Mobility  Bed Mobility Overal bed mobility: Needs Assistance Bed Mobility: Supine to Sit;Sit to Supine     Supine to sit: Max assist;+2 for physical assistance Sit to supine: Max assist;+2 for physical assistance   General bed mobility comments: increased time and effort, multimodal cueing to participate, assistance needed for movement of LEs off of and back onto bed, assistance needed for trunk elevation to achieve an upright sitting position at EOB    Transfers                 General transfer comment: deferred, focus of session was on sitting balance  Ambulation/Gait                 Stairs              Wheelchair Mobility    Modified Rankin (Stroke Patients Only) Modified Rankin (Stroke Patients Only) Pre-Morbid Rankin Score: No symptoms Modified Rankin: Severe disability     Balance Overall balance assessment: Needs assistance Sitting-balance support: Feet supported;No upper extremity supported;Single extremity supported Sitting balance-Leahy Scale: Poor Sitting balance - Comments: fluctuating between requiring max A to min A, and very brief periods of min guard (~2-3 seconds)                                    Cognition Arousal/Alertness: Awake/alert Behavior During Therapy: Flat affect Overall Cognitive Status: Difficult to assess Area of Impairment: Orientation;Attention;Memory;Following commands;Awareness;Safety/judgement;Problem solving                 Orientation Level: Disoriented to;Situation Current Attention Level: Focused   Following Commands: Follows one step commands with increased time Safety/Judgement: Decreased awareness of safety Awareness: Intellectual Problem Solving: Decreased initiation;Requires verbal cues;Requires tactile cues        Exercises General Exercises - Lower Extremity Ankle Circles/Pumps: AROM;Left;10 reps;Seated;Other (comment) (attempted on R, unable to perform) Long Arc Quad: AROM;Left;10 reps;Seated;Other (comment) (attempted on R, unable to perform) Hip Flexion/Marching: AROM;Left;10 reps;Seated;Other (comment) (attempted on R, unable to perform) Other Exercises Other Exercises: pt worked on a seated balance and lateral weight shifting activity of L lateral lean with WB'ing through forearm  and then returning to upright sitting at midline with mod A. pt had a tendency to push towards his R with L UE supported on bed or using bed rail. pt worked on a midline sitting balance activity with bilateral UEs resting in his lap with fluctuating levels of assist needed (min-max A mostly with very brief bouts of min guard  for a few seconds)    General Comments        Pertinent Vitals/Pain Pain Assessment: Faces Faces Pain Scale: No hurt    Home Living                      Prior Function            PT Goals (current goals can now be found in the care plan section) Acute Rehab PT Goals PT Goal Formulation: With family Time For Goal Achievement: 01/06/21 Potential to Achieve Goals: Fair Progress towards PT goals: Progressing toward goals    Frequency    Min 3X/week      PT Plan Current plan remains appropriate    Co-evaluation              AM-PAC PT "6 Clicks" Mobility   Outcome Measure  Help needed turning from your back to your side while in a flat bed without using bedrails?: A Lot Help needed moving from lying on your back to sitting on the side of a flat bed without using bedrails?: Total Help needed moving to and from a bed to a chair (including a wheelchair)?: Total Help needed standing up from a chair using your arms (e.g., wheelchair or bedside chair)?: Total Help needed to walk in hospital room?: Total Help needed climbing 3-5 steps with a railing? : Total 6 Click Score: 7    End of Session Equipment Utilized During Treatment: Oxygen Activity Tolerance: Patient limited by fatigue Patient left: in bed;with call bell/phone within reach;with bed alarm set Nurse Communication: Mobility status;Other (comment) (IV leaking and pt requiring pericare for BM) PT Visit Diagnosis: Muscle weakness (generalized) (M62.81);Hemiplegia and hemiparesis Hemiplegia - Right/Left: Right Hemiplegia - dominant/non-dominant: Dominant Hemiplegia - caused by: Cerebral infarction     Time: 4235-3614 PT Time Calculation (min) (ACUTE ONLY): 17 min  Charges:  $Therapeutic Activity: 8-22 mins                     Arletta Bale, DPT  Acute Rehabilitation Services Office [phone]    Alessandra Bevels Zaryia Markel 12/31/2020, 12:31 PM

## 2021-01-01 DIAGNOSIS — I10 Essential (primary) hypertension: Secondary | ICD-10-CM | POA: Diagnosis not present

## 2021-01-01 DIAGNOSIS — J9601 Acute respiratory failure with hypoxia: Secondary | ICD-10-CM | POA: Diagnosis not present

## 2021-01-01 DIAGNOSIS — J101 Influenza due to other identified influenza virus with other respiratory manifestations: Secondary | ICD-10-CM | POA: Diagnosis not present

## 2021-01-01 DIAGNOSIS — I25119 Atherosclerotic heart disease of native coronary artery with unspecified angina pectoris: Secondary | ICD-10-CM | POA: Diagnosis not present

## 2021-01-01 LAB — GLUCOSE, CAPILLARY
Glucose-Capillary: 133 mg/dL — ABNORMAL HIGH (ref 70–99)
Glucose-Capillary: 115 mg/dL — ABNORMAL HIGH (ref 70–99)
Glucose-Capillary: 161 mg/dL — ABNORMAL HIGH (ref 70–99)
Glucose-Capillary: 170 mg/dL — ABNORMAL HIGH (ref 70–99)

## 2021-01-01 MED ORDER — PANTOPRAZOLE SODIUM 40 MG PO TBEC
40.0000 mg | DELAYED_RELEASE_TABLET | Freq: Every day | ORAL | Status: DC
  Administered 2021-01-01 – 2021-01-11 (×11): 40 mg via ORAL
  Filled 2021-01-01 (×11): qty 1

## 2021-01-01 MED ORDER — GABAPENTIN 300 MG PO CAPS
300.0000 mg | ORAL_CAPSULE | Freq: Two times a day (BID) | ORAL | Status: DC
  Administered 2021-01-01 – 2021-01-11 (×21): 300 mg via ORAL
  Filled 2021-01-01 (×22): qty 1

## 2021-01-01 MED ORDER — METOPROLOL TARTRATE 25 MG PO TABS
25.0000 mg | ORAL_TABLET | Freq: Two times a day (BID) | ORAL | Status: DC
  Administered 2021-01-01 – 2021-01-12 (×22): 25 mg via ORAL
  Filled 2021-01-01 (×22): qty 1
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
## 2021-01-01 NOTE — Progress Notes (Signed)
Occupational Therapy Treatment Patient Details Name: Brandon Daniel MRN: [id number] DOB: [birth year] Today's Date: 01/01/2021    History of present illness [name] is a 70 y.o. male admitted 6/9 that  presented with SOB, was found to be Influenza A + and later on day of admission developed mild R facial droop, R arm drift and hand grip weakness along with R leg weakness. Left MCA and ACA CVA. Pt received tPa. VDRF 6/11-6/15. PMH: CAD, HTN, lumbar stenosis, HLD   OT comments  Patient with incremental progress toward patient focused OT goals.  He is rolling better to the R, up to Min A and guidance of L hand to SR.  Cues to find needed items to his R side, but did spontaneously turn his head to the R and look for OT when calling his name.  Patient was able to sit EOB for up to one minute, but became dizzy.  Patient lied back down and BP taken 149/77.  SNF is recommended given +2 physical assist and insufficient care at home.  OT will continue to follow acutely.    Follow Up Recommendations  SNF    Equipment Recommendations  Wheelchair cushion (measurements OT);Wheelchair (measurements OT);Hospital bed    Recommendations for Other Services      Precautions / Restrictions Precautions Precautions: Fall;Other (comment) Precaution Comments: R hemi Restrictions Weight Bearing Restrictions: No       Mobility Bed Mobility Overal bed mobility: Needs Assistance Bed Mobility: Rolling;Sidelying to Sit;Sit to Sidelying Rolling: Min assist Sidelying to sit: Mod assist;Max assist     Sit to sidelying: Max assist General bed mobility comments: assist to elevate trunk and assist for feet off/on the bed. Patient Response: Flat affect;Cooperative  Transfers                      Balance Overall balance assessment: Needs assistance Sitting-balance support: Feet supported;No upper extremity supported;Single extremity supported Sitting balance-Leahy Scale: Poor   Postural  control: Right lateral lean;Posterior lean                                    Vision   Additional Comments: Patient is scanning more to the R visual field.  Patient turned his head to the R and looked for OT when entering the room, when his name was called.                         Pertinent Vitals/ Pain       Pain Assessment: Faces Faces Pain Scale: No hurt Pain Intervention(s): Monitored during session                                                          Frequency  Min 2X/week        Progress Toward Goals  OT Goals(current goals can now be found in the care plan section)     Acute Rehab OT Goals Patient Stated Goal: none stated OT Goal Formulation: Patient unable to participate in goal setting Time For Goal Achievement: 01/08/21 Potential to Achieve Goals: Good  Plan Discharge plan remains appropriate    Co-evaluation  AM-PAC OT "6 Clicks" Daily Activity     Outcome Measure   Help from another person eating meals?: A Lot Help from another person taking care of personal grooming?: A Lot Help from another person toileting, which includes using toliet, bedpan, or urinal?: Total Help from another person bathing (including washing, rinsing, drying)?: A Lot Help from another person to put on and taking off regular upper body clothing?: A Lot Help from another person to put on and taking off regular lower body clothing?: Total 6 Click Score: 10    End of Session    OT Visit Diagnosis: Unsteadiness on feet (R26.81);Hemiplegia and hemiparesis;Cognitive communication deficit (R41.841) Symptoms and signs involving cognitive functions: Cerebral infarction Hemiplegia - Right/Left: Right Hemiplegia - dominant/non-dominant: Dominant   Activity Tolerance Patient tolerated treatment well   Patient Left in bed;with call bell/phone within reach;with bed alarm set   Nurse Communication           Time: 8657-8469 OT Time Calculation (min): 20 min  Charges: OT General Charges $OT Visit: 1 Visit OT Treatments $Therapeutic Activity: 8-22 mins  01/01/2021  Rich, OTR/L  Acute Rehabilitation Services  Office:  [phone]    [name] 01/01/2021, 11:21 AM

## 2021-01-01 NOTE — Progress Notes (Signed)
PROGRESS NOTE  Brandon Daniel WUJ:[id number] DOB: Jan 18, 1951   PCP: Pcp, No  Patient is from: Home  DOA: 12/03/2020 LOS: 29  Chief complaints:  Chief Complaint  Patient presents with   Shortness of Breath     Brief Narrative / Interim history: 70 year old M with PMH of CAD, HTN and HLD presenting with shortness of breath and admitted for influenza A infection and elevated troponin.  The next day, noted to have acute change in neuro status with acute right-sided weakness.  Work-up indicated left MCA and ACA territory infarct.  Received tPA.  On 6/10, developed respiratory failure requiring intubation, and found to have bilateral lower lobe pneumonia.  Treated with antibiotics and diuretics.  Subsequently extubated on 6/17 and transferred to [facility] service on 6/22.  Has had recurrence of fever and found to have Klebsiella ESBL UTI and staph pneumonia.  Started on meropenem.  Also found to have RLE DVT for which he was started on DOAC.  Has dysphagia and started on TF via cortrak.  However, cortrak fell out on 7/2.  Currently on trial of oral intake which remains poor to maintain adequate caloric need.  However, family thinks he is eating better and would like to hold off PEG tube.  Therapy recommended CIR but CIR suggesting SNF.  Subjective: Seen and examined earlier this morning.  No major events overnight of this morning.  He cannot talk due to expressive aphasia but knows to questions.  Denies pain or shortness of breath.  Objective: Vitals:   01/01/21 0401 01/01/21 0730 01/01/21 0916 01/01/21 1142  BP: 138/76 128/74  (!) 142/79  Pulse: 87 80  79  Resp: 17 18    Temp: 98.8 F (37.1 C) 98.6 F (37 C)    TempSrc: Oral Oral    SpO2: 100% 100% 97%   Weight:      Height:        Intake/Output Summary (Last 24 hours) at 01/01/2021 1207 Last data filed at 01/01/2021 0800 Gross per 24 hour  Intake 30 ml  Output 1850 ml  Net -1820 ml   Filed Weights   12/27/20 0616 12/28/20 0500 12/29/20  0500  Weight: 99.1 kg 93.7 kg 95 kg    Examination:  GENERAL: No apparent distress.  Nontoxic. HEENT: MMM.  Vision and hearing grossly intact.  NECK: Supple.  No apparent JVD.  RESP: 100% on 3 L.  No IWOB.  Fair aeration bilaterally. CVS:  RRR. Heart sounds normal.  ABD/GI/GU: BS+. Abd soft, NTND.  Indwelling Foley in place. MSK/EXT:  Moves extremities. No apparent deformity. No edema.  SKIN: no apparent skin lesion or wound NEURO: Awake and alert.  Follows commands.  Expressive aphasia.  Right hemiparesis. PSYCH: Calm. Normal affect.   Procedures:  Intubation and extubation  Microbiology summarized: 6/9-influenza A PCR positive. 6/9-COVID-19 PCR and MRSA PCR negative. 6/24-urine culture with ESBL Klebsiella pneumonia 6/23-respiratory culture with staph aureus 6/24-blood cultures NGTD   Assessment & Plan: Left MCA/ACA territory stroke-symptoms include dysphagia, aphasia and right hemiparesis. 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  LDL 55.  A1c 6.2%. -On Eliquis and aspirin.  Neurology recommended DAPT once he completes Eliquis course for DVT -Outpatient follow-up with neurology -Neuro interventional radiology recommended outpatient follow-up for ICA stenting   Non-STEMI: Noted to have new ST depressions in V6 and worsening T wave inversions in inferior leads.  Seen by cardiology and underwent left heart cath on 6/14 that  showed multivessel disease. -Cardiology recommended medical management and outpatient follow-up   Acute respiratory failure with hypoxia-multifactorial-influenza A pneumonia (resolved), staph pneumonia and aspiration in the setting of dysphagia.  Completed 5 days of IV Unasyn on 7/8. -Treat treatable causes -Wean oxygen as able. -Aspiration precaution  Dysphagia due to CVA-cortrack fell off on 7/2.  Currently on dysphagia 1 diet. -Family would like to hold off PEG tube. PO intake remians poor.  I have discussed with his nephew, Kandis Mannan who will discuss with patient's sibling and let us know.    ESBL Klebsiella UTI -Completed course with 2 days of cefepime, 3 days of meropenem and fosfomycin on 6/29    RLE peroneal DVT-age-indeterminate.   -Decision is made to treat given high risk for extension due to immobility -once his anticoagulation course is complete, he should return to DAPT   Prediabetes: A1c 6.2%.  Does not seem to be on medication at home. Recent Labs  Lab 12/31/20 1245 12/31/20 1642 12/31/20 2103 01/01/21 0622 01/01/21 1140  GLUCAP 126* 159* 173* 133* 115*  -Continue current insulin regimen and a statin.  Urinary retention, recurrent: Failed multiple voiding trials.  Foley catheter placed back again on 7/2.   -Continue Flomax -Outpatient follow-up with urology for voiding trial   Hypertension/NSVT: TTE with LVEF 55-60%.  Check TSH: 0.833. : Normotensive. -Continue metoprolol -Optimize electrolytes  AKI: Resolved  Goals of care -seen by palliative care. Family has elected to continue with full scope of treatment   Diarrhea: Resolved  Abnormal LFTs: Improved -Check LFT periodically   Hypokalemia: Resolved  Nutrition: See dysphagia above Body mass index is 30.91 kg/m. Nutrition Problem: Inadequate oral intake Etiology: inability to eat Signs/Symptoms: NPO status Interventions: Tube feeding, Prostat  Pressure skin injury: POA Pressure Injury 12/23/20 Nare Left;Lower Stage 2 -  Partial thickness loss of dermis presenting as a shallow open injury with a red, pink wound bed without slough. Injury to the left nare where cortrak tie made contact with the skin. (Active)  12/23/20 1800  Location: Nare  Location Orientation: Left;Lower  Staging: Stage 2 -  Partial thickness loss of dermis presenting as a shallow open injury with a red, pink wound bed without slough.  Wound Description (Comments): Injury to the left nare where cortrak tie made contact with  the skin.  Present on Admission: No   DVT prophylaxis:   apixaban (ELIQUIS) tablet 5 mg   Code Status: Full code Family Communication: Patient and/or RN.  Updated patient's nephew, Link Snuffer over the phone  Level of care: Med-Surg Status is: Inpatient  Remains inpatient appropriate because:Unsafe d/c plan, Inpatient level of care appropriate due to severity of illness, and reliable means of feeding if he does not maintain adequate caloric intake orally  Dispo: The patient is from: Home              Anticipated d/c is to: SNF              Patient currently is not medically stable to d/c.   Difficult to place patient No       Consultants:  Neurology   Sch Meds:  Scheduled Meds:  apixaban  5 mg Oral BID   arformoterol  15 mcg Nebulization BID   aspirin  81 mg Oral Daily   atorvastatin  80 mg Oral Daily   budesonide (PULMICORT) nebulizer solution  0.5 mg Nebulization BID   chlorhexidine  15 mL Mouth Rinse BID   Chlorhexidine Gluconate Cloth  6 each Topical  Daily   ezetimibe  10 mg Oral Daily   gabapentin  300 mg Oral BID   insulin aspart  0-5 Units Subcutaneous QHS   insulin aspart  0-9 Units Subcutaneous TID WC   insulin glargine  5 Units Subcutaneous BID   mouth rinse  15 mL Mouth Rinse q12n4p   metoprolol tartrate  25 mg Oral BID   montelukast  10 mg Oral QHS   multivitamin with minerals  1 tablet Oral Daily   pantoprazole sodium  40 mg Oral QHS   QUEtiapine  12.5 mg Oral QHS   sodium chloride flush  3 mL Intravenous Q12H   tamsulosin  0.4 mg Oral QPC supper   Continuous Infusions:  sodium chloride     sodium chloride 50 mL/hr at 12/28/20 2240   PRN Meds:.sodium chloride, acetaminophen, albuterol, food thickener, guaiFENesin-dextromethorphan, nitroGLYCERIN, ondansetron (ZOFRAN) IV, senna-docusate, sodium chloride flush  Antimicrobials: Anti-infectives (From admission, onward)    Start     Dose/Rate Route Frequency Ordered Stop   12/27/20 2300   Ampicillin-Sulbactam (UNASYN) 3 g in sodium chloride 0.9 % 100 mL IVPB  Status:  Discontinued        3 g 200 mL/hr over 30 Minutes Intravenous Every 6 hours 12/27/20 2121 01/01/21 1207   12/23/20 1500  fosfomycin (MONUROL) packet 3 g        3 g Oral  Once 12/23/20 1314 12/23/20 1755   12/20/20 1400  meropenem (MERREM) 1 g in sodium chloride 0.9 % 100 mL IVPB  Status:  Discontinued        1 g 200 mL/hr over 30 Minutes Intravenous Every 8 hours 12/20/20 1123 12/23/20 1314   12/18/20 1100  vancomycin (VANCOREADY) IVPB 1750 mg/350 mL  Status:  Discontinued        1,750 mg 175 mL/hr over 120 Minutes Intravenous Every 24 hours 12/18/20 1002 12/20/20 1626   12/18/20 1045  ceFEPIme (MAXIPIME) 2 g in sodium chloride 0.9 % 100 mL IVPB  Status:  Discontinued        2 g 200 mL/hr over 30 Minutes Intravenous Every 8 hours 12/18/20 0958 12/20/20 1051   12/09/20 0000  cefTRIAXone (ROCEPHIN) 2 g in sodium chloride 0.9 % 100 mL IVPB        2 g 200 mL/hr over 30 Minutes Intravenous Every 24 hours 12/08/20 1044 12/10/20 0003   12/06/20 0000  cefTRIAXone (ROCEPHIN) 2 g in sodium chloride 0.9 % 100 mL IVPB  Status:  Discontinued        2 g 200 mL/hr over 30 Minutes Intravenous Every 24 hours 12/05/20 0033 12/08/20 1044   12/05/20 1000  oseltamivir (TAMIFLU) 6 MG/ML suspension 75 mg        75 mg Per Tube 2 times daily 12/05/20 0755 12/09/20 2157   12/05/20 0200  azithromycin (ZITHROMAX) 500 mg in sodium chloride 0.9 % 250 mL IVPB  Status:  Discontinued        500 mg 250 mL/hr over 60 Minutes Intravenous Every 24 hours 12/05/20 0033 12/08/20 1004   12/05/20 0130  cefTRIAXone (ROCEPHIN) 2 g in sodium chloride 0.9 % 100 mL IVPB        2 g 200 mL/hr over 30 Minutes Intravenous  Once 12/05/20 0035 12/05/20 0145   12/03/20 1030  oseltamivir (TAMIFLU) capsule 75 mg  Status:  Discontinued        75 mg Oral 2 times daily 12/03/20 0929 12/05/20 0755        I have personally  reviewed the following labs and  images: CBC: Recent Labs  Lab 12/26/20 0647 12/30/20 0217 12/31/20 0057  WBC 6.5 3.5* 3.8*  HGB 10.0* 9.6* 9.7*  HCT 32.2* 31.5* 31.4*  MCV 89.0 91.0 89.7  PLT 250 199 201   BMP &GFR Recent Labs  Lab 12/26/20 0647 12/28/20 0155 12/30/20 0217 12/31/20 0057  NA 136 135 140 138  K 3.4* 3.6 3.9 3.9  CL 100 101 105 103  CO2 28 28 31 29   GLUCOSE 116* 134* 138* 138*  BUN 21 15 13 13   CREATININE 0.50* 0.48* 0.60* 0.50*  CALCIUM 8.2* 8.2* 8.0* 8.0*  MG 1.9 1.9 2.1 2.0  PHOS  --   --   --  3.1   Estimated Creatinine Clearance: 101.9 mL/min (A) (by C-G formula based on SCr of 0.5 mg/dL (L)). Liver & Pancreas: Recent Labs  Lab 12/26/20 0647 12/28/20 0155 12/30/20 0217 12/31/20 0057  AST 62* 52* 49* 48*  ALT 101* 83* 68* 67*  ALKPHOS 75 74 65 68  BILITOT 0.5 0.6 0.5 0.6  PROT 6.1* 5.9* 5.4* 5.3*  ALBUMIN 2.1* 2.1* 1.9* 2.0*   No results for input(s): LIPASE, AMYLASE in the last 168 hours. No results for input(s): AMMONIA in the last 168 hours. Diabetic: No results for input(s): HGBA1C in the last 72 hours. Recent Labs  Lab 12/31/20 1245 12/31/20 1642 12/31/20 2103 01/01/21 0622 01/01/21 1140  GLUCAP 126* 159* 173* 133* 115*   Cardiac Enzymes: No results for input(s): CKTOTAL, CKMB, CKMBINDEX, TROPONINI in the last 168 hours. No results for input(s): PROBNP in the last 8760 hours. Coagulation Profile: No results for input(s): INR, PROTIME in the last 168 hours. Thyroid Function Tests: No results for input(s): TSH, T4TOTAL, FREET4, T3FREE, THYROIDAB in the last 72 hours. Lipid Profile: No results for input(s): CHOL, HDL, LDLCALC, TRIG, CHOLHDL, LDLDIRECT in the last 72 hours. Anemia Panel: No results for input(s): VITAMINB12, FOLATE, FERRITIN, TIBC, IRON, RETICCTPCT in the last 72 hours. Urine analysis:    Component Value Date/Time   COLORURINE YELLOW 12/18/2020 0835   APPEARANCEUR CLEAR 12/18/2020 0835   LABSPEC 1.020 12/18/2020 0835   PHURINE 6.0  12/18/2020 0835   GLUCOSEU NEGATIVE 12/18/2020 0835   HGBUR LARGE (A) 12/18/2020 0835   BILIRUBINUR NEGATIVE 12/18/2020 0835   KETONESUR NEGATIVE 12/18/2020 0835   PROTEINUR 100 (A) 12/18/2020 0835   NITRITE POSITIVE (A) 12/18/2020 0835   LEUKOCYTESUR SMALL (A) 12/18/2020 0835   Sepsis Labs: Invalid input(s): PROCALCITONIN, LACTICIDVEN  Microbiology: No results found for this or any previous visit (from the past 240 hour(s)).  Radiology Studies: No results found.    Magdalena Skilton T. Amarie Viles [facility]  If 7PM-7AM, please contact night-coverage [email or web address] 01/01/2021, 12:07 PM

## 2021-01-01 NOTE — TOC Progression Note (Signed)
Transition of Care [facility]) - Progression Note    Patient Details  Name: Brandon Daniel MRN: [id number] Date of Birth: 08-05-1950  Transition of [facility]) CM/SW Contact  Pollie Friar, RN Phone Number: 01/01/2021, 3:05 PM  Clinical Narrative:    CM met with the patient, his brother and sister at the bedside. The family's choices for rehab: #1 [facility]  #2 [facility]   #3 [facility] updated and provided the information for [facility]. They are going to reach out about covering the cost of rehab.  TOC following.   Expected Discharge Plan: Long Term Acute Care (LTAC) Barriers to Discharge: Continued Medical Work up  Expected Discharge Plan and Services Expected Discharge Plan: Long Term Acute Care (LTAC) In-house Referral: Clinical Social Work   Post Acute Care Choice: Long Term Acute Care (LTAC) Living arrangements for the past 2 months: Single Family Home                                       Social Determinants of Health (SDOH) Interventions    Readmission Risk Interventions No flowsheet data found.

## 2021-01-02 DIAGNOSIS — J9601 Acute respiratory failure with hypoxia: Secondary | ICD-10-CM | POA: Diagnosis not present

## 2021-01-02 DIAGNOSIS — I10 Essential (primary) hypertension: Secondary | ICD-10-CM | POA: Diagnosis not present

## 2021-01-02 DIAGNOSIS — J101 Influenza due to other identified influenza virus with other respiratory manifestations: Secondary | ICD-10-CM | POA: Diagnosis not present

## 2021-01-02 DIAGNOSIS — I25119 Atherosclerotic heart disease of native coronary artery with unspecified angina pectoris: Secondary | ICD-10-CM | POA: Diagnosis not present

## 2021-01-02 LAB — HEPATIC FUNCTION PANEL
ALT: 56 U/L — ABNORMAL HIGH (ref 0–44)
AST: 42 U/L — ABNORMAL HIGH (ref 15–41)
Albumin: 2.1 g/dL — ABNORMAL LOW (ref 3.5–5.0)
Alkaline Phosphatase: 65 U/L (ref 38–126)
Bilirubin, Direct: 0.1 mg/dL (ref 0.0–0.2)
Indirect Bilirubin: 0.6 mg/dL (ref 0.3–0.9)
Total Bilirubin: 0.7 mg/dL (ref 0.3–1.2)
Total Protein: 5.5 g/dL — ABNORMAL LOW (ref 6.5–8.1)

## 2021-01-02 LAB — CBC
HCT: 31.6 % — ABNORMAL LOW (ref 39.0–52.0)
Hemoglobin: 9.7 g/dL — ABNORMAL LOW (ref 13.0–17.0)
MCH: 27.7 pg (ref 26.0–34.0)
MCHC: 30.7 g/dL (ref 30.0–36.0)
MCV: 90.3 fL (ref 80.0–100.0)
Platelets: 193 10*3/uL (ref 150–400)
RBC: 3.5 MIL/uL — ABNORMAL LOW (ref 4.22–5.81)
RDW: 16.8 % — ABNORMAL HIGH (ref 11.5–15.5)
WBC: 3.3 10*3/uL — ABNORMAL LOW (ref 4.0–10.5)
nRBC: 0 % (ref 0.0–0.2)

## 2021-01-02 LAB — GLUCOSE, CAPILLARY
Glucose-Capillary: 122 mg/dL — ABNORMAL HIGH (ref 70–99)
Glucose-Capillary: 173 mg/dL — ABNORMAL HIGH (ref 70–99)
Glucose-Capillary: 153 mg/dL — ABNORMAL HIGH (ref 70–99)
Glucose-Capillary: 174 mg/dL — ABNORMAL HIGH (ref 70–99)

## 2021-01-02 LAB — MAGNESIUM: Magnesium: 1.9 mg/dL (ref 1.7–2.4)

## 2021-01-02 NOTE — Progress Notes (Signed)
PROGRESS NOTE  Brandon Daniel FUX:[id number] DOB: 02-20-1951   PCP: Pcp, No  Patient is from: Home  DOA: 12/03/2020 LOS: 30  Chief complaints:  Chief Complaint  Patient presents with   Shortness of Breath     Brief Narrative / Interim history: 70 year old M with PMH of CAD, HTN and HLD presenting with shortness of breath and admitted for influenza A infection and elevated troponin.  The next day, noted to have acute change in neuro status with acute right-sided weakness.  Work-up indicated left MCA and ACA territory infarct.  Received tPA.  On 6/10, developed respiratory failure requiring intubation, and found to have bilateral lower lobe pneumonia.  Treated with antibiotics and diuretics.  Subsequently extubated on 6/17 and transferred to [facility] service on 6/22.  Has had recurrence of fever and found to have Klebsiella ESBL UTI and staph pneumonia.  Started on meropenem.  Also found to have RLE DVT for which he was started on DOAC.  Has dysphagia and started on TF via cortrak.  However, cortrak fell out on 7/2.  Upgraded to dysphagia 1 diet by SLP.  Seems to have good oral intake with assistance.  Therapy recommended CIR but CIR suggesting SNF.  TOC looking for LTAC.  Subjective: Seen and examined earlier this morning.  No major events overnight of this morning.  He cannot verbalize due to aphasia but responds to questions by nodding yes and no.  Responds no to pain or other concern.  Objective: Vitals:   01/02/21 0331 01/02/21 0742 01/02/21 0745 01/02/21 0821  BP: 130/67   137/71  Pulse: 83   81  Resp: 18   16  Temp: 98.9 F (37.2 C)   98.1 F (36.7 C)  TempSrc:    Oral  SpO2: 100% 99% 100% 98%  Weight:      Height:        Intake/Output Summary (Last 24 hours) at 01/02/2021 1157 Last data filed at 01/02/2021 1000 Gross per 24 hour  Intake 560 ml  Output 2600 ml  Net -2040 ml   Filed Weights   12/27/20 0616 12/28/20 0500 12/29/20 0500  Weight: 99.1 kg 93.7 kg 95 kg     Examination:  GENERAL: No apparent distress.  Nontoxic. HEENT: MMM.  Vision and hearing grossly intact.  NECK: Supple.  No apparent JVD.  RESP: 100% on 3 L.  No IWOB.  Fair aeration bilaterally. CVS:  RRR. Heart sounds normal.  ABD/GI/GU: BS+. Abd soft, NTND.  MSK/EXT: Right hemiparesis.  Moves left extremities.  No edema. SKIN: no apparent skin lesion or wound NEURO: Awake and alert.  Follows commands. fairly oriented.  Expressive aphasia.  Right hemiparesis. PSYCH: Calm. Normal affect.  Procedures:  Intubation and extubation  Microbiology summarized: 6/9-influenza A PCR positive. 6/9-COVID-19 PCR and MRSA PCR negative. 6/24-urine culture with ESBL Klebsiella pneumonia 6/23-respiratory culture with staph aureus 6/24-blood cultures NGTD   Assessment & Plan: Left MCA/ACA territory stroke-symptoms include dysphagia, aphasia and right hemiparesis. 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  LDL 55.  A1c 6.2%. -On Eliquis and aspirin.  Neurology recommended DAPT once he completes Eliquis course for DVT -Outpatient follow-up with neurology -Neuro interventional radiology recommended outpatient follow-up for ICA stenting   Non-STEMI: Noted to have new ST depressions in V6 and worsening T wave inversions in inferior leads.  Seen by cardiology and underwent left heart cath on 6/14 that showed multivessel disease. -Cardiology recommended medical management and  outpatient follow-up   Acute respiratory failure with hypoxia-multifactorial-influenza A pneumonia (resolved), staph pneumonia and aspiration in the setting of dysphagia.  Completed 5 days of IV Unasyn on 7/8. 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  Currently on dysphagia 1 diet. -P.o. intake improved.  Has eaten 75% to 100% of his meals over the last 24 hours.   ESBL Klebsiella  UTI -Completed course with 2 days of cefepime, 3 days of meropenem and fosfomycin on 6/29    RLE peroneal DVT-age-indeterminate.   -Decision is made to treat given high risk for extension due to immobility -once his anticoagulation course is complete, he should return to DAPT   Prediabetes: A1c 6.2%.  Does not seem to be on medication at home. Recent Labs  Lab 01/01/21 1140 01/01/21 1641 01/01/21 2108 01/02/21 0608 01/02/21 1152  GLUCAP 115* 161* 170* 122* 173*  -Continue current insulin regimen and a statin.  Urinary retention, recurrent: Failed multiple voiding trials.  Foley catheter placed back again on 7/2.   -Continue Flomax -Outpatient follow-up with urology for voiding trial   Hypertension/NSVT: TTE with LVEF 55-60%.  Check TSH: 0.833. Normotensive. -Continue metoprolol -Optimize electrolytes  AKI: Resolved  Goals of care -seen by palliative care. Family has elected to continue with full scope of treatment   Diarrhea: Resolved  Abnormal LFTs: Improved -Check LFT periodically   Hypokalemia: Resolved  Nutrition: See dysphagia above Body mass index is 30.91 kg/m. Nutrition Problem: Inadequate oral intake Etiology: inability to eat Signs/Symptoms: NPO status Interventions: Tube feeding, Prostat  Pressure skin injury: POA Pressure Injury 12/23/20 Nare Left;Lower Stage 2 -  Partial thickness loss of dermis presenting as a shallow open injury with a red, pink wound bed without slough. Injury to the left nare where cortrak tie made contact with the skin. (Active)  12/23/20 1800  Location: Nare  Location Orientation: Left;Lower  Staging: Stage 2 -  Partial thickness loss of dermis presenting as a shallow open injury with a red, pink wound bed without slough.  Wound Description (Comments): Injury to the left nare where cortrak tie made contact with the skin.  Present on Admission: No   DVT prophylaxis:   apixaban (ELIQUIS) tablet 5 mg   Code Status: Full  code Family Communication: Patient and/or RN.  Updated patient's brother and sister at bedside on 7/8. Level of care: Med-Surg Status is: Inpatient  Remains inpatient appropriate because:Unsafe d/c plan, Inpatient level of care appropriate due to severity of illness, and reliable means of feeding if he does not maintain adequate caloric intake orally  Dispo: The patient is from: Home              Anticipated d/c is to: SNF              Patient currently is not medically stable to d/c.   Difficult to place patient No       Consultants:  Neurology   Sch Meds:  Scheduled Meds:  apixaban  5 mg Oral BID   arformoterol  15 mcg Nebulization BID   aspirin  81 mg Oral Daily   atorvastatin  80 mg Oral Daily   budesonide (PULMICORT) nebulizer solution  0.5 mg Nebulization BID   chlorhexidine  15 mL Mouth Rinse BID   Chlorhexidine Gluconate Cloth  6 each Topical Daily   ezetimibe  10 mg Oral Daily   gabapentin  300 mg Oral BID   insulin aspart  0-5 Units Subcutaneous QHS   insulin aspart  0-9 Units Subcutaneous TID WC   insulin glargine  5 Units Subcutaneous BID   mouth rinse  15 mL Mouth Rinse q12n4p   metoprolol tartrate  25 mg Oral BID   montelukast  10 mg Oral QHS   multivitamin with minerals  1 tablet Oral Daily   pantoprazole  40 mg Oral QHS   QUEtiapine  12.5 mg Oral QHS   sodium chloride flush  3 mL Intravenous Q12H   tamsulosin  0.4 mg Oral QPC supper   Continuous Infusions:  sodium chloride     sodium chloride 50 mL/hr at 12/28/20 2240   PRN Meds:.sodium chloride, acetaminophen, albuterol, food thickener, guaiFENesin-dextromethorphan, nitroGLYCERIN, ondansetron (ZOFRAN) IV, senna-docusate, sodium chloride flush  Antimicrobials: Anti-infectives (From admission, onward)    Start     Dose/Rate Route Frequency Ordered Stop   12/27/20 2300  Ampicillin-Sulbactam (UNASYN) 3 g in sodium chloride 0.9 % 100 mL IVPB  Status:  Discontinued        3 g 200 mL/hr over 30  Minutes Intravenous Every 6 hours 12/27/20 2121 01/01/21 1207   12/23/20 1500  fosfomycin (MONUROL) packet 3 g        3 g Oral  Once 12/23/20 1314 12/23/20 1755   12/20/20 1400  meropenem (MERREM) 1 g in sodium chloride 0.9 % 100 mL IVPB  Status:  Discontinued        1 g 200 mL/hr over 30 Minutes Intravenous Every 8 hours 12/20/20 1123 12/23/20 1314   12/18/20 1100  vancomycin (VANCOREADY) IVPB 1750 mg/350 mL  Status:  Discontinued        1,750 mg 175 mL/hr over 120 Minutes Intravenous Every 24 hours 12/18/20 1002 12/20/20 1626   12/18/20 1045  ceFEPIme (MAXIPIME) 2 g in sodium chloride 0.9 % 100 mL IVPB  Status:  Discontinued        2 g 200 mL/hr over 30 Minutes Intravenous Every 8 hours 12/18/20 0958 12/20/20 1051   12/09/20 0000  cefTRIAXone (ROCEPHIN) 2 g in sodium chloride 0.9 % 100 mL IVPB        2 g 200 mL/hr over 30 Minutes Intravenous Every 24 hours 12/08/20 1044 12/10/20 0003   12/06/20 0000  cefTRIAXone (ROCEPHIN) 2 g in sodium chloride 0.9 % 100 mL IVPB  Status:  Discontinued        2 g 200 mL/hr over 30 Minutes Intravenous Every 24 hours 12/05/20 0033 12/08/20 1044   12/05/20 1000  oseltamivir (TAMIFLU) 6 MG/ML suspension 75 mg        75 mg Per Tube 2 times daily 12/05/20 0755 12/09/20 2157   12/05/20 0200  azithromycin (ZITHROMAX) 500 mg in sodium chloride 0.9 % 250 mL IVPB  Status:  Discontinued        500 mg 250 mL/hr over 60 Minutes Intravenous Every 24 hours 12/05/20 0033 12/08/20 1004   12/05/20 0130  cefTRIAXone (ROCEPHIN) 2 g in sodium chloride 0.9 % 100 mL IVPB        2 g 200 mL/hr over 30 Minutes Intravenous  Once 12/05/20 0035 12/05/20 0145   12/03/20 1030  oseltamivir (TAMIFLU) capsule 75 mg  Status:  Discontinued        75 mg Oral 2 times daily 12/03/20 0929 12/05/20 0755        I have personally reviewed the following labs and images: CBC: Recent Labs  Lab 12/30/20 0217 12/31/20 0057 01/02/21 0109  WBC 3.5* 3.8* 3.3*  HGB 9.6* 9.7* 9.7*  HCT 31.5*  31.4* 31.6*  MCV 91.0 89.7 90.3  PLT 199 201 193   BMP &GFR Recent Labs  Lab 12/28/20 0155 12/30/20 0217 12/31/20 0057 01/02/21 0109  NA 135 140 138  --   K 3.6 3.9 3.9  --   CL 101 105 103  --   CO2 28 31 29   --   GLUCOSE 134* 138* 138*  --   BUN 15 13 13   --   CREATININE 0.48* 0.60* 0.50*  --   CALCIUM 8.2* 8.0* 8.0*  --   MG 1.9 2.1 2.0 1.9  PHOS  --   --  3.1  --    Estimated Creatinine Clearance: 101.9 mL/min (A) (by C-G formula based on SCr of 0.5 mg/dL (L)). Liver & Pancreas: Recent Labs  Lab 12/28/20 0155 12/30/20 0217 12/31/20 0057 01/02/21 0109  AST 52* 49* 48* 42*  ALT 83* 68* 67* 56*  ALKPHOS 74 65 68 65  BILITOT 0.6 0.5 0.6 0.7  PROT 5.9* 5.4* 5.3* 5.5*  ALBUMIN 2.1* 1.9* 2.0* 2.1*   No results for input(s): LIPASE, AMYLASE in the last 168 hours. No results for input(s): AMMONIA in the last 168 hours. Diabetic: No results for input(s): HGBA1C in the last 72 hours. Recent Labs  Lab 01/01/21 1140 01/01/21 1641 01/01/21 2108 01/02/21 0608 01/02/21 1152  GLUCAP 115* 161* 170* 122* 173*   Cardiac Enzymes: No results for input(s): CKTOTAL, CKMB, CKMBINDEX, TROPONINI in the last 168 hours. No results for input(s): PROBNP in the last 8760 hours. Coagulation Profile: No results for input(s): INR, PROTIME in the last 168 hours. Thyroid Function Tests: No results for input(s): TSH, T4TOTAL, FREET4, T3FREE, THYROIDAB in the last 72 hours. Lipid Profile: No results for input(s): CHOL, HDL, LDLCALC, TRIG, CHOLHDL, LDLDIRECT in the last 72 hours. Anemia Panel: No results for input(s): VITAMINB12, FOLATE, FERRITIN, TIBC, IRON, RETICCTPCT in the last 72 hours. Urine analysis:    Component Value Date/Time   COLORURINE YELLOW 12/18/2020 0835   APPEARANCEUR CLEAR 12/18/2020 0835   LABSPEC 1.020 12/18/2020 0835   PHURINE 6.0 12/18/2020 0835   GLUCOSEU NEGATIVE 12/18/2020 0835   HGBUR LARGE (A) 12/18/2020 0835   BILIRUBINUR NEGATIVE 12/18/2020 0835    KETONESUR NEGATIVE 12/18/2020 0835   PROTEINUR 100 (A) 12/18/2020 0835   NITRITE POSITIVE (A) 12/18/2020 0835   LEUKOCYTESUR SMALL (A) 12/18/2020 0835   Sepsis Labs: Invalid input(s): PROCALCITONIN, LACTICIDVEN  Microbiology: No results found for this or any previous visit (from the past 240 hour(s)).  Radiology Studies: No results found.    Ardyth Kelso T. Keniesha Adderly [facility]  If 7PM-7AM, please contact night-coverage [email or web address] 01/02/2021, 11:57 AM

## 2021-01-03 DIAGNOSIS — J9601 Acute respiratory failure with hypoxia: Secondary | ICD-10-CM | POA: Diagnosis not present

## 2021-01-03 DIAGNOSIS — I25119 Atherosclerotic heart disease of native coronary artery with unspecified angina pectoris: Secondary | ICD-10-CM | POA: Diagnosis not present

## 2021-01-03 DIAGNOSIS — J101 Influenza due to other identified influenza virus with other respiratory manifestations: Secondary | ICD-10-CM | POA: Diagnosis not present

## 2021-01-03 DIAGNOSIS — I10 Essential (primary) hypertension: Secondary | ICD-10-CM | POA: Diagnosis not present

## 2021-01-03 LAB — GLUCOSE, CAPILLARY
Glucose-Capillary: 138 mg/dL — ABNORMAL HIGH (ref 70–99)
Glucose-Capillary: 168 mg/dL — ABNORMAL HIGH (ref 70–99)
Glucose-Capillary: 121 mg/dL — ABNORMAL HIGH (ref 70–99)
Glucose-Capillary: 180 mg/dL — ABNORMAL HIGH (ref 70–99)

## 2021-01-03 NOTE — Progress Notes (Signed)
PROGRESS NOTE  Brandon Daniel XNA:[id number] DOB: 30-Dec-1950   PCP: Pcp, No  Patient is from: Home  DOA: 12/03/2020 LOS: 31  Chief complaints:  Chief Complaint  Patient presents with   Shortness of Breath     Brief Narrative / Interim history: 70 year old M with PMH of CAD, HTN and HLD presenting with shortness of breath and admitted for influenza A infection and elevated troponin.  The next day, noted to have acute change in neuro status with acute right-sided weakness.  Work-up indicated left MCA and ACA territory infarct.  Received tPA.  On 6/10, developed respiratory failure requiring intubation, and found to have bilateral lower lobe pneumonia.  Treated with antibiotics and diuretics.  Subsequently extubated on 6/17 and transferred to [facility] service on 6/22.  Has had recurrence of fever and found to have Klebsiella ESBL UTI and staph pneumonia.  Started on meropenem.  Also found to have RLE DVT for which he was started on DOAC.  Has dysphagia and started on TF via cortrak.  However, cortrak fell out on 7/2.  Upgraded to dysphagia 1 diet by SLP.  Seems to have good oral intake with assistance.  Therapy recommended CIR but CIR suggesting SNF.  TOC looking for LTAC.  Subjective: Seen and examined earlier this morning.  No major events overnight of this morning.  Sleepy but wakes to voice.  Has no complaints.  He denies pain.  He likes to go back to sleep.  Objective: Vitals:   01/03/21 0823 01/03/21 0838 01/03/21 0841 01/03/21 1140  BP: (!) 154/77   (!) 154/70  Pulse: 96   89  Resp: 18   14  Temp: 98.6 F (37 C)   98.3 F (36.8 C)  TempSrc: Oral   Oral  SpO2: 92% 96% 96% 97%  Weight:      Height:        Intake/Output Summary (Last 24 hours) at 01/03/2021 1353 Last data filed at 01/03/2021 1300 Gross per 24 hour  Intake 820 ml  Output 1125 ml  Net -305 ml   Filed Weights   12/27/20 0616 12/28/20 0500 12/29/20 0500  Weight: 99.1 kg 93.7 kg 95 kg    Examination:  GENERAL:  No apparent distress.  Nontoxic. HEENT: MMM.  Vision and hearing grossly intact.  NECK: Supple.  No apparent JVD.  RESP: 97% on RA.  No IWOB.  Fair aeration bilaterally. CVS:  RRR. Heart sounds normal.  ABD/GI/GU: BS+. Abd soft, NTND.  Indwelling Foley. MSK/EXT: Right hemiparesis.  Moves left extremities.  No edema. SKIN: no apparent skin lesion or wound NEURO: Sleepy but wakes to voice easily.  Follows commands.  Fairly oriented.  Expressive aphasia.  Right hemiparesis. PSYCH: Calm. Normal affect.   Procedures:  Intubation and extubation  Microbiology summarized: 6/9-influenza A PCR positive. 6/9-COVID-19 PCR and MRSA PCR negative. 6/24-urine culture with ESBL Klebsiella pneumonia 6/23-respiratory culture with staph aureus 6/24-blood cultures NGTD   Assessment & Plan: Left MCA/ACA territory stroke-symptoms include dysphagia, aphasia and right hemiparesis. 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  LDL 55.  A1c 6.2%. -On Eliquis and aspirin.  Neurology recommendede DAPT once he completes Eliquis course for DVT -Outpatient follow-up with neurology -Neuro interventional radiology recommended outpatient follow-up for ICA stenting   Non-STEMI: Noted to have new ST depressions in V6 and worsening T wave inversions in inferior leads.  Seen by cardiology and underwent left heart cath on 6/14 that showed multivessel  disease. -Cardiology recommended medical management and outpatient follow-up   Acute respiratory failure with hypoxia-multifactorial-influenza A pneumonia (resolved), staph pneumonia and aspiration in the setting of dysphagia.  Completed 5 days of IV Unasyn on 7/8.  Respiratory failure resolved. 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  Currently on dysphagia 1 diet. -P.o. intake improved.  Has eaten 90-100% of his meals   ESBL Klebsiella UTI -Completed course  with 2 days of cefepime, 3 days of meropenem and fosfomycin on 6/29    RLE peroneal DVT-age-indeterminate.   -Decision is made to treat given high risk for extension due to immobility   Prediabetes: A1c 6.2%.  Does not seem to be on medication at home. Recent Labs  Lab 01/02/21 1152 01/02/21 1615 01/02/21 2142 01/03/21 0640 01/03/21 1136  GLUCAP 173* 153* 174* 138* 168*  -Continue current insulin regimen and a statin.  Urinary retention, recurrent: Failed multiple voiding trials.  Foley catheter placed back again on 7/2.   -Continue Flomax -Outpatient follow-up with urology for voiding trial   Hypertension/NSVT: TTE with LVEF 55-60%.  Check TSH: 0.833. Normotensive. -Continue metoprolol -Optimize electrolytes  AKI: Resolved  Goals of care -seen by palliative care. Family has elected to continue with full scope of treatment   Diarrhea: Resolved  Abnormal LFTs: Improved -Check LFT periodically   Hypokalemia: Resolved  Nutrition: See dysphagia above Body mass index is 30.91 kg/m. Nutrition Problem: Inadequate oral intake Etiology: inability to eat Signs/Symptoms: NPO status Interventions: Tube feeding, Prostat  Pressure skin injury: POA Pressure Injury 12/23/20 Nare Left;Lower Stage 2 -  Partial thickness loss of dermis presenting as a shallow open injury with a red, pink wound bed without slough. Injury to the left nare where cortrak tie made contact with the skin. (Active)  12/23/20 1800  Location: Nare  Location Orientation: Left;Lower  Staging: Stage 2 -  Partial thickness loss of dermis presenting as a shallow open injury with a red, pink wound bed without slough.  Wound Description (Comments): Injury to the left nare where cortrak tie made contact with the skin.  Present on Admission: No   DVT prophylaxis:   apixaban (ELIQUIS) tablet 5 mg   Code Status: Full code Family Communication: Patient and/or RN.  Updated patient's brother and sister at bedside on  7/8.  None at bedside today Level of care: Med-Surg Status is: Inpatient  Remains inpatient appropriate because:Unsafe d/c plan  Dispo: The patient is from: Home              Anticipated d/c is to: SNF              Patient currently is medically stable to d/c.   Difficult to place patient No       Consultants:  Neurology   Sch Meds:  Scheduled Meds:  apixaban  5 mg Oral BID   arformoterol  15 mcg Nebulization BID   aspirin  81 mg Oral Daily   atorvastatin  80 mg Oral Daily   budesonide (PULMICORT) nebulizer solution  0.5 mg Nebulization BID   chlorhexidine  15 mL Mouth Rinse BID   Chlorhexidine Gluconate Cloth  6 each Topical Daily   ezetimibe  10 mg Oral Daily   gabapentin  300 mg Oral BID   insulin aspart  0-5 Units Subcutaneous QHS   insulin aspart  0-9 Units Subcutaneous TID WC   insulin glargine  5 Units Subcutaneous BID   mouth rinse  15 mL Mouth Rinse  q12n4p   metoprolol tartrate  25 mg Oral BID   montelukast  10 mg Oral QHS   multivitamin with minerals  1 tablet Oral Daily   pantoprazole  40 mg Oral QHS   QUEtiapine  12.5 mg Oral QHS   sodium chloride flush  3 mL Intravenous Q12H   tamsulosin  0.4 mg Oral QPC supper   Continuous Infusions:  sodium chloride     sodium chloride 50 mL/hr at 12/28/20 2240   PRN Meds:.sodium chloride, acetaminophen, albuterol, food thickener, guaiFENesin-dextromethorphan, nitroGLYCERIN, ondansetron (ZOFRAN) IV, senna-docusate, sodium chloride flush  Antimicrobials: Anti-infectives (From admission, onward)    Start     Dose/Rate Route Frequency Ordered Stop   12/27/20 2300  Ampicillin-Sulbactam (UNASYN) 3 g in sodium chloride 0.9 % 100 mL IVPB  Status:  Discontinued        3 g 200 mL/hr over 30 Minutes Intravenous Every 6 hours 12/27/20 2121 01/01/21 1207   12/23/20 1500  fosfomycin (MONUROL) packet 3 g        3 g Oral  Once 12/23/20 1314 12/23/20 1755   12/20/20 1400  meropenem (MERREM) 1 g in sodium chloride 0.9 % 100 mL  IVPB  Status:  Discontinued        1 g 200 mL/hr over 30 Minutes Intravenous Every 8 hours 12/20/20 1123 12/23/20 1314   12/18/20 1100  vancomycin (VANCOREADY) IVPB 1750 mg/350 mL  Status:  Discontinued        1,750 mg 175 mL/hr over 120 Minutes Intravenous Every 24 hours 12/18/20 1002 12/20/20 1626   12/18/20 1045  ceFEPIme (MAXIPIME) 2 g in sodium chloride 0.9 % 100 mL IVPB  Status:  Discontinued        2 g 200 mL/hr over 30 Minutes Intravenous Every 8 hours 12/18/20 0958 12/20/20 1051   12/09/20 0000  cefTRIAXone (ROCEPHIN) 2 g in sodium chloride 0.9 % 100 mL IVPB        2 g 200 mL/hr over 30 Minutes Intravenous Every 24 hours 12/08/20 1044 12/10/20 0003   12/06/20 0000  cefTRIAXone (ROCEPHIN) 2 g in sodium chloride 0.9 % 100 mL IVPB  Status:  Discontinued        2 g 200 mL/hr over 30 Minutes Intravenous Every 24 hours 12/05/20 0033 12/08/20 1044   12/05/20 1000  oseltamivir (TAMIFLU) 6 MG/ML suspension 75 mg        75 mg Per Tube 2 times daily 12/05/20 0755 12/09/20 2157   12/05/20 0200  azithromycin (ZITHROMAX) 500 mg in sodium chloride 0.9 % 250 mL IVPB  Status:  Discontinued        500 mg 250 mL/hr over 60 Minutes Intravenous Every 24 hours 12/05/20 0033 12/08/20 1004   12/05/20 0130  cefTRIAXone (ROCEPHIN) 2 g in sodium chloride 0.9 % 100 mL IVPB        2 g 200 mL/hr over 30 Minutes Intravenous  Once 12/05/20 0035 12/05/20 0145   12/03/20 1030  oseltamivir (TAMIFLU) capsule 75 mg  Status:  Discontinued        75 mg Oral 2 times daily 12/03/20 0929 12/05/20 0755        I have personally reviewed the following labs and images: CBC: Recent Labs  Lab 12/30/20 0217 12/31/20 0057 01/02/21 0109  WBC 3.5* 3.8* 3.3*  HGB 9.6* 9.7* 9.7*  HCT 31.5* 31.4* 31.6*  MCV 91.0 89.7 90.3  PLT 199 201 193   BMP &GFR Recent Labs  Lab 12/28/20 0155 12/30/20 0217 12/31/20  0057 01/02/21 0109  NA 135 140 138  --   K 3.6 3.9 3.9  --   CL 101 105 103  --   CO2 28 31 29   --    GLUCOSE 134* 138* 138*  --   BUN 15 13 13   --   CREATININE 0.48* 0.60* 0.50*  --   CALCIUM 8.2* 8.0* 8.0*  --   MG 1.9 2.1 2.0 1.9  PHOS  --   --  3.1  --    Estimated Creatinine Clearance: 101.9 mL/min (A) (by C-G formula based on SCr of 0.5 mg/dL (L)). Liver & Pancreas: Recent Labs  Lab 12/28/20 0155 12/30/20 0217 12/31/20 0057 01/02/21 0109  AST 52* 49* 48* 42*  ALT 83* 68* 67* 56*  ALKPHOS 74 65 68 65  BILITOT 0.6 0.5 0.6 0.7  PROT 5.9* 5.4* 5.3* 5.5*  ALBUMIN 2.1* 1.9* 2.0* 2.1*   No results for input(s): LIPASE, AMYLASE in the last 168 hours. No results for input(s): AMMONIA in the last 168 hours. Diabetic: No results for input(s): HGBA1C in the last 72 hours. Recent Labs  Lab 01/02/21 1152 01/02/21 1615 01/02/21 2142 01/03/21 0640 01/03/21 1136  GLUCAP 173* 153* 174* 138* 168*   Cardiac Enzymes: No results for input(s): CKTOTAL, CKMB, CKMBINDEX, TROPONINI in the last 168 hours. No results for input(s): PROBNP in the last 8760 hours. Coagulation Profile: No results for input(s): INR, PROTIME in the last 168 hours. Thyroid Function Tests: No results for input(s): TSH, T4TOTAL, FREET4, T3FREE, THYROIDAB in the last 72 hours. Lipid Profile: No results for input(s): CHOL, HDL, LDLCALC, TRIG, CHOLHDL, LDLDIRECT in the last 72 hours. Anemia Panel: No results for input(s): VITAMINB12, FOLATE, FERRITIN, TIBC, IRON, RETICCTPCT in the last 72 hours. Urine analysis:    Component Value Date/Time   COLORURINE YELLOW 12/18/2020 0835   APPEARANCEUR CLEAR 12/18/2020 0835   LABSPEC 1.020 12/18/2020 0835   PHURINE 6.0 12/18/2020 0835   GLUCOSEU NEGATIVE 12/18/2020 0835   HGBUR LARGE (A) 12/18/2020 0835   BILIRUBINUR NEGATIVE 12/18/2020 0835   KETONESUR NEGATIVE 12/18/2020 0835   PROTEINUR 100 (A) 12/18/2020 0835   NITRITE POSITIVE (A) 12/18/2020 0835   LEUKOCYTESUR SMALL (A) 12/18/2020 0835   Sepsis Labs: Invalid input(s): PROCALCITONIN,  LACTICIDVEN  Microbiology: No results found for this or any previous visit (from the past 240 hour(s)).  Radiology Studies: No results found.    Lakresha Stifter T. Rykar Lebleu [facility]  If 7PM-7AM, please contact night-coverage [email or web address] 01/03/2021, 1:53 PM

## 2021-01-04 DIAGNOSIS — I25119 Atherosclerotic heart disease of native coronary artery with unspecified angina pectoris: Secondary | ICD-10-CM | POA: Diagnosis not present

## 2021-01-04 DIAGNOSIS — J9601 Acute respiratory failure with hypoxia: Secondary | ICD-10-CM | POA: Diagnosis not present

## 2021-01-04 DIAGNOSIS — I10 Essential (primary) hypertension: Secondary | ICD-10-CM | POA: Diagnosis not present

## 2021-01-04 DIAGNOSIS — J101 Influenza due to other identified influenza virus with other respiratory manifestations: Secondary | ICD-10-CM | POA: Diagnosis not present

## 2021-01-04 LAB — COMPREHENSIVE METABOLIC PANEL
ALT: 49 U/L — ABNORMAL HIGH (ref 0–44)
AST: 35 U/L (ref 15–41)
Albumin: 2.4 g/dL — ABNORMAL LOW (ref 3.5–5.0)
Alkaline Phosphatase: 72 U/L (ref 38–126)
Anion gap: 6 (ref 5–15)
BUN: 12 mg/dL (ref 8–23)
CO2: 30 mmol/L (ref 22–32)
Calcium: 8.5 mg/dL — ABNORMAL LOW (ref 8.9–10.3)
Chloride: 101 mmol/L (ref 98–111)
Creatinine, Ser: 0.53 mg/dL — ABNORMAL LOW (ref 0.61–1.24)
GFR, Estimated: >60 mL/min (ref >60)
Glucose, Bld: 135 mg/dL — ABNORMAL HIGH (ref 70–99)
Potassium: 3.7 mmol/L (ref 3.5–5.1)
Sodium: 137 mmol/L (ref 135–145)
Total Bilirubin: 0.8 mg/dL (ref 0.3–1.2)
Total Protein: 6 g/dL — ABNORMAL LOW (ref 6.5–8.1)

## 2021-01-04 LAB — RETICULOCYTES
Immature Retic Fract: 21.1 % — ABNORMAL HIGH (ref 2.3–15.9)
RBC.: 4.16 MIL/uL — ABNORMAL LOW (ref 4.22–5.81)
Retic Count, Absolute: 117.7 10*3/uL (ref 19.0–186.0)
Retic Ct Pct: 2.8 % (ref 0.4–3.1)

## 2021-01-04 LAB — CBC
HCT: 36.7 % — ABNORMAL LOW (ref 39.0–52.0)
Hemoglobin: 11.5 g/dL — ABNORMAL LOW (ref 13.0–17.0)
MCH: 27.8 pg (ref 26.0–34.0)
MCHC: 31.3 g/dL (ref 30.0–36.0)
MCV: 88.6 fL (ref 80.0–100.0)
Platelets: 162 10*3/uL (ref 150–400)
RBC: 4.14 MIL/uL — ABNORMAL LOW (ref 4.22–5.81)
RDW: 17 % — ABNORMAL HIGH (ref 11.5–15.5)
WBC: 3.4 10*3/uL — ABNORMAL LOW (ref 4.0–10.5)
nRBC: 0 % (ref 0.0–0.2)

## 2021-01-04 LAB — GLUCOSE, CAPILLARY
Glucose-Capillary: 121 mg/dL — ABNORMAL HIGH (ref 70–99)
Glucose-Capillary: 158 mg/dL — ABNORMAL HIGH (ref 70–99)
Glucose-Capillary: 175 mg/dL — ABNORMAL HIGH (ref 70–99)
Glucose-Capillary: 183 mg/dL — ABNORMAL HIGH (ref 70–99)

## 2021-01-04 LAB — FERRITIN: Ferritin: 31 ng/mL (ref 24–336)

## 2021-01-04 LAB — FOLATE: Folate: 16.2 ng/mL (ref >5.9)

## 2021-01-04 LAB — IRON AND TIBC
Iron: 48 ug/dL (ref 45–182)
Saturation Ratios: 13 % — ABNORMAL LOW (ref 17.9–39.5)
TIBC: 382 ug/dL (ref 250–450)
UIBC: 334 ug/dL

## 2021-01-04 LAB — MAGNESIUM: Magnesium: 2.1 mg/dL (ref 1.7–2.4)

## 2021-01-04 LAB — VITAMIN B12: Vitamin B-12: 861 pg/mL (ref 180–914)

## 2021-01-04 LAB — PHOSPHORUS: Phosphorus: 4.1 mg/dL (ref 2.5–4.6)

## 2021-01-04 MED ORDER — POLYETHYLENE GLYCOL 3350 17 G PO PACK: 17.0000 g | PACK | Freq: Two times a day (BID) | ORAL | Status: DC | PRN

## 2021-01-04 MED ORDER — DOCUSATE SODIUM 100 MG PO CAPS
100.0000 mg | ORAL_CAPSULE | Freq: Every day | ORAL | Status: DC
  Administered 2021-01-04 – 2021-01-11 (×8): 100 mg via ORAL
  Filled 2021-01-04 (×9): qty 1

## 2021-01-04 MED ORDER — SENNOSIDES-DOCUSATE SODIUM 8.6-50 MG PO TABS: 1.0000 | ORAL_TABLET | Freq: Two times a day (BID) | ORAL | Status: DC | PRN

## 2021-01-04 MED ORDER — FERROUS SULFATE 325 (65 FE) MG PO TABS
325.0000 mg | ORAL_TABLET | Freq: Two times a day (BID) | ORAL | Status: DC
  Administered 2021-01-04 – 2021-01-12 (×17): 325 mg via ORAL
  Filled 2021-01-04 (×17): qty 1

## 2021-01-04 NOTE — Progress Notes (Signed)
PROGRESS NOTE  Brandon Daniel TJQ:[id number] DOB: 07/05/1950   PCP: Pcp, No  Patient is from: Home  DOA: 12/03/2020 LOS: 32  Chief complaints:  Chief Complaint  Patient presents with   Shortness of Breath     Brief Narrative / Interim history: 70 year old M with PMH of CAD, HTN and HLD presenting with shortness of breath and admitted for influenza A infection and elevated troponin.  The next day, noted to have acute change in neuro status with acute right-sided weakness.  Work-up indicated left MCA and ACA territory infarct.  Received tPA.  On 6/10, developed respiratory failure requiring intubation, and found to have bilateral lower lobe pneumonia.  Treated with antibiotics and diuretics.  Subsequently extubated on 6/17 and transferred to [facility] service on 6/22.  Has had recurrence of fever and found to have Klebsiella ESBL UTI and staph pneumonia.  Started on meropenem.  Also found to have RLE DVT for which he was started on DOAC.  Has dysphagia and started on TF via cortrak.  However, cortrak fell out on 7/2.  Upgraded to dysphagia 1 diet by SLP.  Seems to have good oral intake with assistance.  Therapy recommended CIR but CIR suggesting SNF.  TOC looking for LTAC.  Subjective: Seen and examined earlier this morning.  No major events overnight of this morning.  He has complete aphasia but nods yes or no to questions.  He looks unhappy this morning but notes no to pain.  He could only draw circles when I gave him pen and paper. He can't use right hand due to hemiparesis.  Objective: Vitals:   01/04/21 0003 01/04/21 0516 01/04/21 0733 01/04/21 0803  BP: (!) 127/57 (!) 144/75  (!) 150/87  Pulse: 72 90  86  Resp: 18 18  18   Temp: 98.2 F (36.8 C) 98.2 F (36.8 C)  98.2 F (36.8 C)  TempSrc: Oral Oral  Oral  SpO2: 94% 93% 96% 92%  Weight:      Height:        Intake/Output Summary (Last 24 hours) at 01/04/2021 1216 Last data filed at 01/04/2021 03/07/2021 Gross per 24 hour  Intake 480 ml   Output 2500 ml  Net -2020 ml   Filed Weights   12/27/20 0616 12/28/20 0500 12/29/20 0500  Weight: 99.1 kg 93.7 kg 95 kg    Examination:  GENERAL: No apparent distress.  Nontoxic. HEENT: MMM.  Vision and hearing grossly intact.  NECK: Supple.  No apparent JVD.  RESP: On RA.  No IWOB.  Fair aeration bilaterally. CVS:  RRR. Heart sounds normal.  ABD/GI/GU: BS+. Abd soft, NTND.  Indwelling Foley. MSK/EXT:  Moves extremities. No apparent deformity. No edema.  SKIN: no apparent skin lesion or wound NEURO: Awake and alert.  Follows commands.  Complete expressive aphasia.  Right facial droop.  Right hemiparesis. PSYCH: Calm.  Looks unhappy today but no distress or agitation.  Procedures:  Intubation and extubation  Microbiology summarized: 6/9-influenza A PCR positive. 6/9-COVID-19 PCR and MRSA PCR negative. 6/24-urine culture with ESBL Klebsiella pneumonia 6/23-respiratory culture with staph aureus 6/24-blood cultures NGTD   Assessment & Plan: Left MCA/ACA territory stroke-dysphagia, aphasia, right facial droop and right hemiparesis. 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  LDL 55.  A1c 6.2%. -On Eliquis and aspirin.  Neurology recommendede DAPT once he completes Eliquis course for DVT -Outpatient follow-up with neurology -Neuro interventional radiology recommended outpatient follow-up for ICA stenting -Continue dysphagia  1 diet   Non-STEMI: Noted to have new ST depressions in V6 and worsening T wave inversions in inferior leads.  Seen by cardiology and underwent left heart cath on 6/14 that showed multivessel disease. -Cardiology recs-management and outpatient follow-up   Acute respiratory failure with hypoxia-initially due to influenza A pneumonia followed by staph pneumonia and aspiration in the setting of dysphagia.  Completed 5 days of IV Unasyn on 7/8.  Resolved. -Treat treatable causes -Aspiration  precaution  Dysphagia due to CVA-cortrack fell off on 7/2.  Currently on dysphagia 1 diet. -P.o. intake improved.  Has eaten 90-100% of his meals   ESBL Klebsiella UTI -Completed course with 2 days of cefepime, 3 days of meropenem and fosfomycin on 6/29    RLE peroneal DVT-age-indeterminate.   -Decision is made to treat given high risk for extension due to immobility   Prediabetes: A1c 6.2%.  Does not seem to be on medication at home. Recent Labs  Lab 01/03/21 0640 01/03/21 1136 01/03/21 1630 01/03/21 2039 01/04/21 0619  GLUCAP 138* 168* 121* 180* 121*  -Continue current insulin regimen and a statin.  Urinary retention, recurrent: Failed multiple voiding trials.  Foley catheter placed back again on 7/2.   -Continue Flomax -Outpatient follow-up with urology for voiding trial   Hypertension/NSVT: TTE with LVEF 55-60%.  Check TSH: 0.833. Normotensive. -Continue metoprolol -Optimize electrolytes  AKI: Resolved  Goals of care -seen by palliative care. Family has elected to continue with full scope of treatment   Diarrhea: Resolved  Abnormal LFTs: Improved -Check LFT periodically   Hypokalemia: Resolved  Nutrition: See dysphagia above Body mass index is 30.91 kg/m. Nutrition Problem: Inadequate oral intake Etiology: inability to eat Signs/Symptoms: NPO status Interventions: Tube feeding, Prostat  Pressure skin injury: POA Pressure Injury 12/23/20 Nare Left;Lower Stage 2 -  Partial thickness loss of dermis presenting as a shallow open injury with a red, pink wound bed without slough. Injury to the left nare where cortrak tie made contact with the skin. (Active)  12/23/20 1800  Location: Nare  Location Orientation: Left;Lower  Staging: Stage 2 -  Partial thickness loss of dermis presenting as a shallow open injury with a red, pink wound bed without slough.  Wound Description (Comments): Injury to the left nare where cortrak tie made contact with the skin.  Present on  Admission: No   DVT prophylaxis:   apixaban (ELIQUIS) tablet 5 mg   Code Status: Full code Family Communication: Patient and/or RN.  None at bedside today Level of care: Med-Surg Status is: Inpatient  Remains inpatient appropriate because:Unsafe d/c plan  Dispo: The patient is from: Home              Anticipated d/c is to: SNF              Patient currently is medically stable to d/c.   Difficult to place patient No       Consultants:  Neurology-signed off Cardiology-signed off   Sch Meds:  Scheduled Meds:  apixaban  5 mg Oral BID   arformoterol  15 mcg Nebulization BID   aspirin  81 mg Oral Daily   atorvastatin  80 mg Oral Daily   budesonide (PULMICORT) nebulizer solution  0.5 mg Nebulization BID   chlorhexidine  15 mL Mouth Rinse BID   Chlorhexidine Gluconate Cloth  6 each Topical Daily   ezetimibe  10 mg Oral Daily   gabapentin  300 mg Oral BID   insulin aspart  0-5 Units Subcutaneous QHS  insulin aspart  0-9 Units Subcutaneous TID WC   insulin glargine  5 Units Subcutaneous BID   mouth rinse  15 mL Mouth Rinse q12n4p   metoprolol tartrate  25 mg Oral BID   montelukast  10 mg Oral QHS   multivitamin with minerals  1 tablet Oral Daily   pantoprazole  40 mg Oral QHS   QUEtiapine  12.5 mg Oral QHS   sodium chloride flush  3 mL Intravenous Q12H   tamsulosin  0.4 mg Oral QPC supper   Continuous Infusions:  sodium chloride     sodium chloride 50 mL/hr at 01/03/21 1859   PRN Meds:.sodium chloride, acetaminophen, albuterol, food thickener, guaiFENesin-dextromethorphan, nitroGLYCERIN, ondansetron (ZOFRAN) IV, senna-docusate, sodium chloride flush  Antimicrobials: Anti-infectives (From admission, onward)    Start     Dose/Rate Route Frequency Ordered Stop   12/27/20 2300  Ampicillin-Sulbactam (UNASYN) 3 g in sodium chloride 0.9 % 100 mL IVPB  Status:  Discontinued        3 g 200 mL/hr over 30 Minutes Intravenous Every 6 hours 12/27/20 2121 01/01/21 1207    12/23/20 1500  fosfomycin (MONUROL) packet 3 g        3 g Oral  Once 12/23/20 1314 12/23/20 1755   12/20/20 1400  meropenem (MERREM) 1 g in sodium chloride 0.9 % 100 mL IVPB  Status:  Discontinued        1 g 200 mL/hr over 30 Minutes Intravenous Every 8 hours 12/20/20 1123 12/23/20 1314   12/18/20 1100  vancomycin (VANCOREADY) IVPB 1750 mg/350 mL  Status:  Discontinued        1,750 mg 175 mL/hr over 120 Minutes Intravenous Every 24 hours 12/18/20 1002 12/20/20 1626   12/18/20 1045  ceFEPIme (MAXIPIME) 2 g in sodium chloride 0.9 % 100 mL IVPB  Status:  Discontinued        2 g 200 mL/hr over 30 Minutes Intravenous Every 8 hours 12/18/20 0958 12/20/20 1051   12/09/20 0000  cefTRIAXone (ROCEPHIN) 2 g in sodium chloride 0.9 % 100 mL IVPB        2 g 200 mL/hr over 30 Minutes Intravenous Every 24 hours 12/08/20 1044 12/10/20 0003   12/06/20 0000  cefTRIAXone (ROCEPHIN) 2 g in sodium chloride 0.9 % 100 mL IVPB  Status:  Discontinued        2 g 200 mL/hr over 30 Minutes Intravenous Every 24 hours 12/05/20 0033 12/08/20 1044   12/05/20 1000  oseltamivir (TAMIFLU) 6 MG/ML suspension 75 mg        75 mg Per Tube 2 times daily 12/05/20 0755 12/09/20 2157   12/05/20 0200  azithromycin (ZITHROMAX) 500 mg in sodium chloride 0.9 % 250 mL IVPB  Status:  Discontinued        500 mg 250 mL/hr over 60 Minutes Intravenous Every 24 hours 12/05/20 0033 12/08/20 1004   12/05/20 0130  cefTRIAXone (ROCEPHIN) 2 g in sodium chloride 0.9 % 100 mL IVPB        2 g 200 mL/hr over 30 Minutes Intravenous  Once 12/05/20 0035 12/05/20 0145   12/03/20 1030  oseltamivir (TAMIFLU) capsule 75 mg  Status:  Discontinued        75 mg Oral 2 times daily 12/03/20 0929 12/05/20 0755        I have personally reviewed the following labs and images: CBC: Recent Labs  Lab 12/30/20 0217 12/31/20 0057 01/02/21 0109 01/04/21 0419  WBC 3.5* 3.8* 3.3* 3.4*  HGB 9.6* 9.7*  9.7* 11.5*  HCT 31.5* 31.4* 31.6* 36.7*  MCV 91.0 89.7 90.3  88.6  PLT 199 201 193 162   BMP &GFR Recent Labs  Lab 12/30/20 0217 12/31/20 0057 01/02/21 0109 01/04/21 0419  NA 140 138  --  137  K 3.9 3.9  --  3.7  CL 105 103  --  101  CO2 31 29  --  30  GLUCOSE 138* 138*  --  135*  BUN 13 13  --  12  CREATININE 0.60* 0.50*  --  0.53*  CALCIUM 8.0* 8.0*  --  8.5*  MG 2.1 2.0 1.9 2.1  PHOS  --  3.1  --  4.1   Estimated Creatinine Clearance: 101.9 mL/min (A) (by C-G formula based on SCr of 0.53 mg/dL (L)). Liver & Pancreas: Recent Labs  Lab 12/30/20 0217 12/31/20 0057 01/02/21 0109 01/04/21 0419  AST 49* 48* 42* 35  ALT 68* 67* 56* 49*  ALKPHOS 65 68 65 72  BILITOT 0.5 0.6 0.7 0.8  PROT 5.4* 5.3* 5.5* 6.0*  ALBUMIN 1.9* 2.0* 2.1* 2.4*   No results for input(s): LIPASE, AMYLASE in the last 168 hours. No results for input(s): AMMONIA in the last 168 hours. Diabetic: No results for input(s): HGBA1C in the last 72 hours. Recent Labs  Lab 01/03/21 0640 01/03/21 1136 01/03/21 1630 01/03/21 2039 01/04/21 0619  GLUCAP 138* 168* 121* 180* 121*   Cardiac Enzymes: No results for input(s): CKTOTAL, CKMB, CKMBINDEX, TROPONINI in the last 168 hours. No results for input(s): PROBNP in the last 8760 hours. Coagulation Profile: No results for input(s): INR, PROTIME in the last 168 hours. Thyroid Function Tests: No results for input(s): TSH, T4TOTAL, FREET4, T3FREE, THYROIDAB in the last 72 hours. Lipid Profile: No results for input(s): CHOL, HDL, LDLCALC, TRIG, CHOLHDL, LDLDIRECT in the last 72 hours. Anemia Panel: Recent Labs    01/04/21 0419  VITAMINB12 861  FOLATE 16.2  FERRITIN 31  TIBC 382  IRON 48  RETICCTPCT 2.8   Urine analysis:    Component Value Date/Time   COLORURINE YELLOW 12/18/2020 0835   APPEARANCEUR CLEAR 12/18/2020 0835   LABSPEC 1.020 12/18/2020 0835   PHURINE 6.0 12/18/2020 0835   GLUCOSEU NEGATIVE 12/18/2020 0835   HGBUR LARGE (A) 12/18/2020 0835   BILIRUBINUR NEGATIVE 12/18/2020 0835   KETONESUR  NEGATIVE 12/18/2020 0835   PROTEINUR 100 (A) 12/18/2020 0835   NITRITE POSITIVE (A) 12/18/2020 0835   LEUKOCYTESUR SMALL (A) 12/18/2020 0835   Sepsis Labs: Invalid input(s): PROCALCITONIN, LACTICIDVEN  Microbiology: No results found for this or any previous visit (from the past 240 hour(s)).  Radiology Studies: No results found.    Cataldo Cosgriff T. Shirlie Enck [facility]  If 7PM-7AM, please contact night-coverage [email or web address] 01/04/2021, 12:16 PM
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
## 2021-01-04 NOTE — Progress Notes (Signed)
Physical Therapy Treatment Patient Details Name: Brandon Daniel MRN: [id number] DOB: 1951-06-24 Today's Date: 01/04/2021    History of Present Illness [name] is a 70 y.o. male admitted 6/9 that  presented with SOB, was found to be Influenza A + and later on day of admission developed mild R facial droop, R arm drift and hand grip weakness along with R leg weakness. Left MCA and ACA CVA. Pt received tPa. VDRF 6/11-6/15. PMH: CAD, HTN, lumbar stenosis, HLD    PT Comments    Today's skilled session continued to focus on mobility progression. Able to advance to EOB with one person assistance this session. Acute PT to continue during pt's hospital stay.   Follow Up Recommendations  SNF;Other (comment);No PT follow up (per family request)     Equipment Recommendations  Other (comment) (defer to next venue of care)    Recommendations for Other Services Rehab consult     Precautions / Restrictions Precautions Precautions: Fall;Other (comment) Precaution Comments: R hemi Restrictions Weight Bearing Restrictions: No    Mobility  Bed Mobility Overal bed mobility: Needs Assistance Bed Mobility: Rolling;Sidelying to Sit;Sit to Sidelying Rolling: Min assist Sidelying to sit: Max assist     Sit to sidelying: Mod assist;+2 for physical assistance General bed mobility comments: with HOB 30 degrees- min assist with rail/cues/faciliation to roll onto left side. mod/max assist of 1 person for sidelying to sitting edge of bed with most assistance needed for trunk elevation as pt was able to assist with bringing LE's off edge of mat. at end of session min/mod assist to return to left sidelying rolling into supine in bed.    Transfers Overall transfer level: Needs assistance   Transfers: Lateral/Scoot Transfers          Lateral/Scoot Transfers: Max assist;+2 physical assistance General transfer comment: at end of session prior to lying down worked on lateral scooting toward  left/head of bed with 2 person max assist with cues/faciliation for weight shifting with use of pads under pt to facilitate movements.  Ambulation/Gait             General Gait Details: unable       Modified Rankin (Stroke Patients Only) Modified Rankin (Stroke Patients Only) Pre-Morbid Rankin Score: No symptoms Modified Rankin: Severe disability     Balance Overall balance assessment: Needs assistance Sitting-balance support: Feet supported;No upper extremity supported;Single extremity supported Sitting balance-Leahy Scale: Poor Sitting balance - Comments: focused on working towards midline and holding position there as pt tends to lean toward the right, sometimes posterior. use of PTA seated in front of pt for visual reference to achieve midline. Once there had pt work on reaching with left UE up, out to side to PTA hand as target. Attempted left elbow propping x1 rep with pt resistant to staying in this position. assistance at EOB varied from min guard to mod assist with PT tech seated next to pt on right for safety. Postural control: Right lateral lean;Posterior lean              Cognition Arousal/Alertness: Awake/alert Behavior During Therapy: Flat affect Overall Cognitive Status: Difficult to assess Area of Impairment: Orientation;Attention;Memory;Following commands;Safety/judgement;Awareness;Problem solving                   Current Attention Level: Focused   Following Commands: Follows one step commands with increased time Safety/Judgement: Decreased awareness of safety   Problem Solving: Decreased initiation;Requires verbal cues;Requires tactile cues General Comments: Pt gesturing at times; not  mouthing words or trying to speak at this time. Pt nodding or shaking head.      Exercises General Exercises - Lower Extremity Ankle Circles/Pumps: PROM;Right;10 reps;Supine;Limitations Ankle Circles/Pumps Limitations: passive with heel cord stretch with each  rep Quad Sets: PROM;Strengthening;Right;10 reps;Supine Heel Slides: PROM;AAROM;Strengthening;Right;10 reps;Supine;Limitations Heel Slides Limitations: minimal muscle activation noted with each rep Hip ABduction/ADduction: PROM;AAROM;Strengthening;Right;10 reps;Supine;Limitations Hip Abduction/Adduction Limitations: minimal muscle activation noted with each rep Straight Leg Raises: PROM;Strengthening;Right;10 reps;Supine Straight Leg Raises Limitations: minimal muscle activation noted with each rep     Pertinent Vitals/Pain Pain Assessment: Faces Faces Pain Scale: Hurts a little bit Pain Location: generalized Pain Descriptors / Indicators: Discomfort;Grimacing (grimacing with movements, however pt shook head "no" to pain at start of session) Pain Intervention(s): Limited activity within patient's tolerance;Monitored during session;Repositioned     PT Goals (current goals can now be found in the care plan section) Acute Rehab PT Goals Patient Stated Goal: none stated PT Goal Formulation: With family Time For Goal Achievement: 01/06/21 Potential to Achieve Goals: Fair Progress towards PT goals: Progressing toward goals    Frequency    Min 3X/week      PT Plan Current plan remains appropriate    AM-PAC PT "6 Clicks" Mobility   Outcome Measure  Help needed turning from your back to your side while in a flat bed without using bedrails?: A Lot Help needed moving from lying on your back to sitting on the side of a flat bed without using bedrails?: Total Help needed moving to and from a bed to a chair (including a wheelchair)?: Total Help needed standing up from a chair using your arms (e.g., wheelchair or bedside chair)?: Total Help needed to walk in hospital room?: Total Help needed climbing 3-5 steps with a railing? : Total 6 Click Score: 7    End of Session   Activity Tolerance: Patient tolerated treatment well;Patient limited by fatigue Patient left: in bed;with call  bell/phone within reach;with bed alarm set;with nursing/sitter in room;with family/visitor present Nurse Communication: Mobility status;Other (comment) (pt with episode of emesis when returned to supine in bed, cleaned up by PTA/tech) PT Visit Diagnosis: Muscle weakness (generalized) (M62.81);Hemiplegia and hemiparesis Hemiplegia - Right/Left: Right Hemiplegia - dominant/non-dominant: Dominant Hemiplegia - caused by: Cerebral infarction     Time: 3536-1443 PT Time Calculation (min) (ACUTE ONLY): 21 min  Charges:  $Therapeutic Activity: 8-22 mins           Sallyanne Kuster, PTA, CLT Acute Rehab Services Office- [phone] 01/04/21, 1:05 PM   Sallyanne Kuster 01/04/2021, 1:04 PM

## 2021-01-04 NOTE — Plan of Care (Signed)
  Problem: Education: Goal: Knowledge of General Education information will improve Description: Including pain rating scale, medication(s)/side effects and non-pharmacologic comfort measures Outcome: Progressing   Problem: Health Behavior/Discharge Planning: Goal: Ability to manage health-related needs will improve Outcome: Progressing   Problem: Ischemic Stroke/TIA Tissue Perfusion: Goal: Complications of ischemic stroke/TIA will be minimized Outcome: Progressing

## 2021-01-04 NOTE — TOC Progression Note (Signed)
Transition of Care [facility]) - Progression Note    Patient Details  Name: Brandon Daniel MRN: [id number] Date of Birth: 1950-12-22  [facility] of [facility]) CM/SW Contact  Baldemar Lenis, [place] Phone Number: 01/04/2021, 3:23 PM  Clinical Narrative:   CSW spoke with Admissions at [facility] today to clarify patient's insurance and approval through business office. Per Admissions, they will attempt to work on getting an approval through the patient's insurance, but it will likely take some time to work out the details. CSW indicated understanding. CSW to follow.    Expected Discharge Plan: Long Term Acute Care (LTAC) Barriers to Discharge: Continued Medical Work up  Expected Discharge Plan and Services Expected Discharge Plan: Long Term Acute Care (LTAC) In-house Referral: Clinical Social Work   Post Acute Care Choice: Long Term Acute Care (LTAC) Living arrangements for the past 2 months: Single Family Home                                       Social Determinants of Health (SDOH) Interventions    Readmission Risk Interventions No flowsheet data found.

## 2021-01-05 DIAGNOSIS — I10 Essential (primary) hypertension: Secondary | ICD-10-CM | POA: Diagnosis not present

## 2021-01-05 DIAGNOSIS — J9601 Acute respiratory failure with hypoxia: Secondary | ICD-10-CM | POA: Diagnosis not present

## 2021-01-05 DIAGNOSIS — I25119 Atherosclerotic heart disease of native coronary artery with unspecified angina pectoris: Secondary | ICD-10-CM | POA: Diagnosis not present

## 2021-01-05 DIAGNOSIS — J101 Influenza due to other identified influenza virus with other respiratory manifestations: Secondary | ICD-10-CM | POA: Diagnosis not present

## 2021-01-05 LAB — GLUCOSE, CAPILLARY
Glucose-Capillary: 134 mg/dL — ABNORMAL HIGH (ref 70–99)
Glucose-Capillary: 186 mg/dL — ABNORMAL HIGH (ref 70–99)
Glucose-Capillary: 134 mg/dL — ABNORMAL HIGH (ref 70–99)
Glucose-Capillary: 130 mg/dL — ABNORMAL HIGH (ref 70–99)

## 2021-01-05 MED ORDER — FLUOXETINE HCL 20 MG PO CAPS
20.0000 mg | ORAL_CAPSULE | Freq: Every day | ORAL | Status: DC
  Administered 2021-01-05 – 2021-01-12 (×8): 20 mg via ORAL
  Filled 2021-01-05 (×8): qty 1

## 2021-01-05 NOTE — Plan of Care (Signed)
  Problem: Education: Goal: Knowledge of General Education information will improve Description: Including pain rating scale, medication(s)/side effects and non-pharmacologic comfort measures Outcome: Progressing   Problem: Clinical Measurements: Goal: Ability to maintain clinical measurements within normal limits will improve Outcome: Progressing Goal: Will remain free from infection Outcome: Progressing Goal: Diagnostic test results will improve Outcome: Progressing Goal: Respiratory complications will improve Outcome: Progressing Goal: Cardiovascular complication will be avoided Outcome: Progressing   Problem: Ischemic Stroke/TIA Tissue Perfusion: Goal: Complications of ischemic stroke/TIA will be minimized Outcome: Progressing   

## 2021-01-05 NOTE — TOC Progression Note (Signed)
Transition of Care [facility]) - Progression Note    Patient Details  Name: Brandon Daniel MRN: [id number] Date of Birth: 21-Sep-1950  Transition of Care [facility]) CM/SW Contact  Baldemar Lenis, [place] Phone Number: 01/05/2021, 4:46 PM  Clinical Narrative:   CSW contacted [facility] to check on progress with insurance authorization, and [facility] has rescinded bed offer as they are unable to meet the patient's needs. CSW attempted to meet with family to discuss other SNF options and no one at bedside. CSW to follow.    Expected Discharge Plan: Skilled Nursing Facility Barriers to Discharge: Insurance Authorization  Expected Discharge Plan and Services Expected Discharge Plan: Skilled Nursing Facility In-house Referral: Clinical Social Work   Post Acute Care Choice: Long Term Acute Care (LTAC) Living arrangements for the past 2 months: Single Family Home                                       Social Determinants of Health (SDOH) Interventions    Readmission Risk Interventions No flowsheet data found.

## 2021-01-05 NOTE — Progress Notes (Signed)
RD Sign Off Note  Pt is on hospital day 33.   Admitting Dx: NSTEMI (non-ST elevated myocardial infarction) (HCC) [I21.4] Acute respiratory failure with hypoxia (HCC) [J96.01] Stroke (cerebrum) (HCC) [I63.9] PMH:  Past Medical History:  Diagnosis Date   Asthma-COPD overlap syndrome (HCC)    CAD (coronary artery disease)    HLD (hyperlipidemia)    HTN (hypertension)    Lumbar disc herniation with radiculopathy    Lumbar stenosis    Osteoporosis    Medications:  Scheduled Meds:  apixaban  5 mg Oral BID   arformoterol  15 mcg Nebulization BID   aspirin  81 mg Oral Daily   atorvastatin  80 mg Oral Daily   budesonide (PULMICORT) nebulizer solution  0.5 mg Nebulization BID   chlorhexidine  15 mL Mouth Rinse BID   Chlorhexidine Gluconate Cloth  6 each Topical Daily   docusate sodium  100 mg Oral Daily   ezetimibe  10 mg Oral Daily   ferrous sulfate  325 mg Oral BID WC   FLUoxetine  20 mg Oral Daily   gabapentin  300 mg Oral BID   insulin aspart  0-5 Units Subcutaneous QHS   insulin aspart  0-9 Units Subcutaneous TID WC   insulin glargine  5 Units Subcutaneous BID   mouth rinse  15 mL Mouth Rinse q12n4p   metoprolol tartrate  25 mg Oral BID   montelukast  10 mg Oral QHS   multivitamin with minerals  1 tablet Oral Daily   pantoprazole  40 mg Oral QHS   QUEtiapine  12.5 mg Oral QHS   sodium chloride flush  3 mL Intravenous Q12H   tamsulosin  0.4 mg Oral QPC supper  Continuous Infusions:  sodium chloride     sodium chloride 50 mL/hr at 01/05/21 1132   Labs: Recent Labs  Lab 12/30/20 0217 12/31/20 0057 01/02/21 0109 01/04/21 0419  NA 140 138  --  137  K 3.9 3.9  --  3.7  CL 105 103  --  101  CO2 31 29  --  30  BUN 13 13  --  12  CREATININE 0.60* 0.50*  --  0.53*  CALCIUM 8.0* 8.0*  --  8.5*  MG 2.1 2.0 1.9 2.1  PHOS  --  3.1  --  4.1  GLUCOSE 138* 138*  --  135*  CBGs: 134-186-134  Wt Readings from Last 15 Encounters:  12/29/20 95 kg  10/20/20 104.3 kg   Body  mass index is 30.91 kg/m. Patient meets criteria for obese based on current BMI.   Per MD, pt pending LTAC placement.   Current diet order is dysphagia 1 with honey thick liquids, patient is consuming approximately 93% of meals at this time. Pt already receiving only honey thick supplement on formulary as part of dysphagia 1 diet order. RD will sign off as no additional nutrition interventions warranted at this time. If further nutrition issues arise, please consult RD.   Eugene Gavia, MS, RD, LDN (she/her/hers) RD pager number and weekend/on-call pager number located in Amion.

## 2021-01-05 NOTE — Progress Notes (Signed)
PROGRESS NOTE  Brandon Daniel BJY:[id number] DOB: [birth year]   PCP: Pcp, No  Patient is from: Home  DOA: 12/03/2020 LOS: 33  Chief complaints:  Chief Complaint  Patient presents with   Shortness of Breath     Brief Narrative / Interim history: 70 year old M with PMH of CAD, HTN and HLD presenting with shortness of breath and admitted for influenza A infection and elevated troponin.  The next day, noted to have acute change in neuro status with acute right-sided weakness.  Work-up indicated left MCA and ACA territory infarct.  Received tPA.  On 6/10, developed respiratory failure requiring intubation, and found to have bilateral lower lobe pneumonia.  Treated with antibiotics and diuretics.  [facility] 6[facility]17 [facility]  Has had recurrence of fever and found to have Klebsiella ESBL UTI and staph pneumonia.  Started on meropenem.  Also found to have RLE DVT for which he was started on DOAC.  Has dysphagia and started on TF via cortrak.  However, cortrak fell out on 7/2.  Upgraded to dysphagia 1 diet by SLP.  Seems to have good oral intake with assistance.  Therapy recommended CIR but CIR suggesting SNF.  TOC looking for LTAC.  Subjective: Seen and examined earlier this morning.  No major events overnight or this morning.  Very emotional about his inability to talk or move his right arm and leg.  He notes no to pain, shortness of breath or abdominal pain.  Objective: Vitals:   01/05/21 0327 01/05/21 0813 01/05/21 0814 01/05/21 1205  BP: (!) 129/102  133/73 128/74  Pulse: 84  90 86  Resp: 20  18 19   Temp: 98.6 F (37 C)  98.7 F (37.1 C) 98.2 F (36.8 C)  TempSrc: Oral  Oral Oral  SpO2: 91% 92% 91% 95%  Weight:      Height:        Intake/Output Summary (Last 24 hours) at 01/05/2021 1516 Last data filed at 01/05/2021 1221 Gross per 24 hour  Intake 468 ml  Output 1400 ml  Net -932 ml   Filed Weights   12/27/20 0616 12/28/20 0500 12/29/20  0500  Weight: 99.1 kg 93.7 kg 95 kg    Examination:  GENERAL: No apparent distress.  Nontoxic. HEENT: MMM.  Vision and hearing grossly intact.  NECK: Supple.  No apparent JVD.  RESP: On RA.  No IWOB.  Fair aeration bilaterally. CVS:  RRR. Heart sounds normal.  ABD/GI/GU: BS+. Abd soft, NTND.  Indwelling Foley. MSK/EXT: Right hemiparesis.  Trace edema bilaterally. SKIN: no apparent skin lesion or wound NEURO: Awake and alert.  Follows commands.  Expressive aphasia.  Right facial droop.  Right hemiparesis. PSYCH: Somewhat emotional and tearful  Procedures:  Intubation and extubation  Microbiology summarized: 6/9-influenza A PCR positive. 6/9-COVID-19 PCR and MRSA PCR negative. 6/24-urine culture with ESBL Klebsiella pneumonia 6/23-respiratory culture with staph aureus 6/24-blood cultures NGTD   Assessment & Plan: Left MCA/ACA territory stroke-dysphagia, aphasia, right facial droop and right hemiparesis. 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  LDL 55.  A1c 6.2%. -On Eliquis and aspirin.  Neurology recommendede DAPT once he completes Eliquis course for DVT -Outpatient follow-up with neurology -Neuro interventional radiology recommended outpatient follow-up for ICA stenting -Continue dysphagia 1 diet -Started Prozac 20 mg daily   Non-STEMI: Noted to have new ST depressions in V6 and worsening T wave inversions in inferior leads.  Seen by  cardiology and underwent left heart cath on 6/14 that showed multivessel disease. -Cardiology recs-management and outpatient follow-up   Acute respiratory failure with hypoxia-initially due to influenza A pneumonia followed by staph pneumonia and aspiration in the setting of dysphagia.  Completed 5 days of IV Unasyn on 7/8.  Resolved. 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  Currently on dysphagia 1 diet. -P.o. intake improved.   Has eaten 90-100% of his meals   ESBL Klebsiella UTI -Completed course with 2 days of cefepime, 3 days of meropenem and fosfomycin on 6/29    RLE peroneal DVT-age-indeterminate.   -Decision is made to treat given high risk for extension due to immobility   Prediabetes: A1c 6.2%.  Does not seem to be on medication at home. Recent Labs  Lab 01/04/21 1217 01/04/21 1643 01/04/21 2040 01/05/21 0638 01/05/21 1153  GLUCAP 158* 175* 183* 134* 186*  -Continue current insulin regimen and a statin.  Urinary retention, recurrent: Failed multiple voiding trials.  Foley catheter placed back again on 7/2.   -Continue Flomax -Outpatient follow-up with urology for voiding trial   Hypertension/NSVT: TTE with LVEF 55-60%.  Check TSH: 0.833. Normotensive. -Continue metoprolol -Optimize electrolytes  AKI: Resolved  Goals of care -Seen by palliative.  Full scope of care.   Diarrhea: Resolved  Abnormal LFTs: Improved -Check LFT periodically   Hypokalemia: Resolved  Nutrition: See dysphagia above Body mass index is 30.91 kg/m. Nutrition Problem: Inadequate oral intake Etiology: inability to eat Signs/Symptoms: NPO status Interventions: Tube feeding, Prostat  Pressure skin injury: POA Pressure Injury 12/23/20 Nare Left;Lower Stage 2 -  Partial thickness loss of dermis presenting as a shallow open injury with a red, pink wound bed without slough. Injury to the left nare where cortrak tie made contact with the skin. (Active)  12/23/20 1800  Location: Nare  Location Orientation: Left;Lower  Staging: Stage 2 -  Partial thickness loss of dermis presenting as a shallow open injury with a red, pink wound bed without slough.  Wound Description (Comments): Injury to the left nare where cortrak tie made contact with the skin.  Present on Admission: No   DVT prophylaxis:   apixaban (ELIQUIS) tablet 5 mg   Code Status: Full code Family Communication: Patient and/or RN.  None at bedside  today Level of care: Med-Surg Status is: Inpatient  Remains inpatient appropriate because:Unsafe d/c plan  Dispo: The patient is from: Home              Anticipated d/c is to: SNF              Patient currently is medically stable to d/c.   Difficult to place patient No       Consultants:  Neurology-signed off Cardiology-signed off   Sch Meds:  Scheduled Meds:  apixaban  5 mg Oral BID   arformoterol  15 mcg Nebulization BID   aspirin  81 mg Oral Daily   atorvastatin  80 mg Oral Daily   budesonide (PULMICORT) nebulizer solution  0.5 mg Nebulization BID   chlorhexidine  15 mL Mouth Rinse BID   Chlorhexidine Gluconate Cloth  6 each Topical Daily   docusate sodium  100 mg Oral Daily   ezetimibe  10 mg Oral Daily   ferrous sulfate  325 mg Oral BID WC   FLUoxetine  20 mg Oral Daily   gabapentin  300 mg Oral BID   insulin aspart  0-5 Units Subcutaneous QHS   insulin  aspart  0-9 Units Subcutaneous TID WC   insulin glargine  5 Units Subcutaneous BID   mouth rinse  15 mL Mouth Rinse q12n4p   metoprolol tartrate  25 mg Oral BID   montelukast  10 mg Oral QHS   multivitamin with minerals  1 tablet Oral Daily   pantoprazole  40 mg Oral QHS   QUEtiapine  12.5 mg Oral QHS   sodium chloride flush  3 mL Intravenous Q12H   tamsulosin  0.4 mg Oral QPC supper   Continuous Infusions:  sodium chloride     sodium chloride 50 mL/hr at 01/05/21 1132   PRN Meds:.sodium chloride, acetaminophen, albuterol, food thickener, guaiFENesin-dextromethorphan, nitroGLYCERIN, ondansetron (ZOFRAN) IV, polyethylene glycol, senna-docusate, sodium chloride flush  Antimicrobials: Anti-infectives (From admission, onward)    Start     Dose/Rate Route Frequency Ordered Stop   12/27/20 2300  Ampicillin-Sulbactam (UNASYN) 3 g in sodium chloride 0.9 % 100 mL IVPB  Status:  Discontinued        3 g 200 mL/hr over 30 Minutes Intravenous Every 6 hours 12/27/20 2121 01/01/21 1207   12/23/20 1500  fosfomycin  (MONUROL) packet 3 g        3 g Oral  Once 12/23/20 1314 12/23/20 1755   12/20/20 1400  meropenem (MERREM) 1 g in sodium chloride 0.9 % 100 mL IVPB  Status:  Discontinued        1 g 200 mL/hr over 30 Minutes Intravenous Every 8 hours 12/20/20 1123 12/23/20 1314   12/18/20 1100  vancomycin (VANCOREADY) IVPB 1750 mg/350 mL  Status:  Discontinued        1,750 mg 175 mL/hr over 120 Minutes Intravenous Every 24 hours 12/18/20 1002 12/20/20 1626   12/18/20 1045  ceFEPIme (MAXIPIME) 2 g in sodium chloride 0.9 % 100 mL IVPB  Status:  Discontinued        2 g 200 mL/hr over 30 Minutes Intravenous Every 8 hours 12/18/20 0958 12/20/20 1051   12/09/20 0000  cefTRIAXone (ROCEPHIN) 2 g in sodium chloride 0.9 % 100 mL IVPB        2 g 200 mL/hr over 30 Minutes Intravenous Every 24 hours 12/08/20 1044 12/10/20 0003   12/06/20 0000  cefTRIAXone (ROCEPHIN) 2 g in sodium chloride 0.9 % 100 mL IVPB  Status:  Discontinued        2 g 200 mL/hr over 30 Minutes Intravenous Every 24 hours 12/05/20 0033 12/08/20 1044   12/05/20 1000  oseltamivir (TAMIFLU) 6 MG/ML suspension 75 mg        75 mg Per Tube 2 times daily 12/05/20 0755 12/09/20 2157   12/05/20 0200  azithromycin (ZITHROMAX) 500 mg in sodium chloride 0.9 % 250 mL IVPB  Status:  Discontinued        500 mg 250 mL/hr over 60 Minutes Intravenous Every 24 hours 12/05/20 0033 12/08/20 1004   12/05/20 0130  cefTRIAXone (ROCEPHIN) 2 g in sodium chloride 0.9 % 100 mL IVPB        2 g 200 mL/hr over 30 Minutes Intravenous  Once 12/05/20 0035 12/05/20 0145   12/03/20 1030  oseltamivir (TAMIFLU) capsule 75 mg  Status:  Discontinued        75 mg Oral 2 times daily 12/03/20 0929 12/05/20 0755        I have personally reviewed the following labs and images: CBC: Recent Labs  Lab 12/30/20 0217 12/31/20 0057 01/02/21 0109 01/04/21 0419  WBC 3.5* 3.8* 3.3* 3.4*  HGB 9.6*  9.7* 9.7* 11.5*  HCT 31.5* 31.4* 31.6* 36.7*  MCV 91.0 89.7 90.3 88.6  PLT 199 201 193 162    BMP &GFR Recent Labs  Lab 12/30/20 0217 12/31/20 0057 01/02/21 0109 01/04/21 0419  NA 140 138  --  137  K 3.9 3.9  --  3.7  CL 105 103  --  101  CO2 31 29  --  30  GLUCOSE 138* 138*  --  135*  BUN 13 13  --  12  CREATININE 0.60* 0.50*  --  0.53*  CALCIUM 8.0* 8.0*  --  8.5*  MG 2.1 2.0 1.9 2.1  PHOS  --  3.1  --  4.1   Estimated Creatinine Clearance: 101.9 mL/min (A) (by C-G formula based on SCr of 0.53 mg/dL (L)). Liver & Pancreas: Recent Labs  Lab 12/30/20 0217 12/31/20 0057 01/02/21 0109 01/04/21 0419  AST 49* 48* 42* 35  ALT 68* 67* 56* 49*  ALKPHOS 65 68 65 72  BILITOT 0.5 0.6 0.7 0.8  PROT 5.4* 5.3* 5.5* 6.0*  ALBUMIN 1.9* 2.0* 2.1* 2.4*   No results for input(s): LIPASE, AMYLASE in the last 168 hours. No results for input(s): AMMONIA in the last 168 hours. Diabetic: No results for input(s): HGBA1C in the last 72 hours. Recent Labs  Lab 01/04/21 1217 01/04/21 1643 01/04/21 2040 01/05/21 0638 01/05/21 1153  GLUCAP 158* 175* 183* 134* 186*   Cardiac Enzymes: No results for input(s): CKTOTAL, CKMB, CKMBINDEX, TROPONINI in the last 168 hours. No results for input(s): PROBNP in the last 8760 hours. Coagulation Profile: No results for input(s): INR, PROTIME in the last 168 hours. Thyroid Function Tests: No results for input(s): TSH, T4TOTAL, FREET4, T3FREE, THYROIDAB in the last 72 hours. Lipid Profile: No results for input(s): CHOL, HDL, LDLCALC, TRIG, CHOLHDL, LDLDIRECT in the last 72 hours. Anemia Panel: Recent Labs    01/04/21 0419  VITAMINB12 861  FOLATE 16.2  FERRITIN 31  TIBC 382  IRON 48  RETICCTPCT 2.8   Urine analysis:    Component Value Date/Time   COLORURINE YELLOW 12/18/2020 0835   APPEARANCEUR CLEAR 12/18/2020 0835   LABSPEC 1.020 12/18/2020 0835   PHURINE 6.0 12/18/2020 0835   GLUCOSEU NEGATIVE 12/18/2020 0835   HGBUR LARGE (A) 12/18/2020 0835   BILIRUBINUR NEGATIVE 12/18/2020 0835   KETONESUR NEGATIVE 12/18/2020 0835    PROTEINUR 100 (A) 12/18/2020 0835   NITRITE POSITIVE (A) 12/18/2020 0835   LEUKOCYTESUR SMALL (A) 12/18/2020 0835   Sepsis Labs: Invalid input(s): PROCALCITONIN, LACTICIDVEN  Microbiology: No results found for this or any previous visit (from the past 240 hour(s)).  Radiology Studies: No results found.    Appolonia Ackert T. Davinia Riccardi [facility]  If 7PM-7AM, please contact night-coverage [email or web address] 01/05/2021, 3:16 PM

## 2021-01-06 DIAGNOSIS — I25119 Atherosclerotic heart disease of native coronary artery with unspecified angina pectoris: Secondary | ICD-10-CM | POA: Diagnosis not present

## 2021-01-06 DIAGNOSIS — I10 Essential (primary) hypertension: Secondary | ICD-10-CM | POA: Diagnosis not present

## 2021-01-06 DIAGNOSIS — J9601 Acute respiratory failure with hypoxia: Secondary | ICD-10-CM | POA: Diagnosis not present

## 2021-01-06 DIAGNOSIS — J101 Influenza due to other identified influenza virus with other respiratory manifestations: Secondary | ICD-10-CM | POA: Diagnosis not present

## 2021-01-06 LAB — GLUCOSE, CAPILLARY
Glucose-Capillary: 119 mg/dL — ABNORMAL HIGH (ref 70–99)
Glucose-Capillary: 223 mg/dL — ABNORMAL HIGH (ref 70–99)
Glucose-Capillary: 158 mg/dL — ABNORMAL HIGH (ref 70–99)
Glucose-Capillary: 125 mg/dL — ABNORMAL HIGH (ref 70–99)

## 2021-01-06 LAB — SARS CORONAVIRUS 2 (TAT 6-24 HRS): SARS Coronavirus 2: NEGATIVE

## 2021-01-06 NOTE — Progress Notes (Signed)
PROGRESS NOTE  Brandon Daniel IWP:[id number] DOB: 1950/10/10   PCP: Pcp, No  Patient is from: Home  DOA: 12/03/2020 LOS: 34  Chief complaints:  Chief Complaint  Patient presents with   Shortness of Breath     Brief Narrative / Interim history: 70 year old M with PMH of CAD, HTN and HLD presenting with shortness of breath and admitted for influenza A infection and elevated troponin.  The next day, noted to have acute change in neuro status with acute right-sided weakness.  Work-up indicated left MCA and ACA territory infarct.  Received tPA.  On 6/10, developed respiratory failure requiring intubation, and found to have bilateral lower lobe pneumonia.  Treated with antibiotics and diuretics.  Subsequently extubated on 6/17 and transferred to [facility] service on 6/22.  Has had recurrence of fever and found to have Klebsiella ESBL UTI and staph pneumonia.  Started on meropenem.  Also found to have RLE DVT for which he was started on DOAC.  Has dysphagia and started on TF via cortrak.  However, cortrak fell out on 7/2.  Upgraded to dysphagia 1 diet by SLP.  Seems to have good oral intake with assistance.  Therapy recommended CIR but CIR suggesting SNF.  TOC looking for LTAC.  Patient is medically stable for discharge.  Subjective: Seen and examined earlier this morning.  No major events overnight of this morning.  No complaints today.  Notes no to pain, shortness of breath or GI symptoms.  Objective: Vitals:   01/06/21 0635 01/06/21 0736 01/06/21 0737 01/06/21 0821  BP:    132/66  Pulse:   91 88  Resp:   16 18  Temp:    98.6 F (37 C)  TempSrc:    Oral  SpO2:  94% 94% 94%  Weight: 97.8 kg     Height:        Intake/Output Summary (Last 24 hours) at 01/06/2021 1252 Last data filed at 01/06/2021 0700 Gross per 24 hour  Intake 240 ml  Output 2770 ml  Net -2530 ml   Filed Weights   12/28/20 0500 12/29/20 0500 01/06/21 0635  Weight: 93.7 kg 95 kg 97.8 kg    Examination:  GENERAL: No  apparent distress.  Nontoxic. HEENT: MMM.  Vision and hearing grossly intact.  NECK: Supple.  No apparent JVD.  RESP: On RA.  No IWOB.  Fair aeration bilaterally. CVS:  RRR. Heart sounds normal.  ABD/GI/GU: BS+. Abd soft, NTND.  Indwelling Foley. MSK/EXT: Moves left extremities.  Right hemiparesis.  Trace edema bilaterally. SKIN: Stage II pressure skin injury as below NEURO: Awake and alert.  Follows commands.  Expressive aphasia.  Right facial droop.  Right hemiparesis. PSYCH: Calm. Normal affect.   Procedures:  Intubation and extubation  Microbiology summarized: 6/9-influenza A PCR positive. 6/9-COVID-19 PCR and MRSA PCR negative. 6/24-urine culture with ESBL Klebsiella pneumonia 6/23-respiratory culture with staph aureus 6/24-blood cultures NGTD   Assessment & Plan: Left MCA/ACA territory stroke-dysphagia, aphasia, right facial droop and right hemiparesis. 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  LDL 55.  A1c 6.2%. -On Eliquis and aspirin.  Neurology recommendede DAPT once he completes Eliquis course for DVT -Outpatient follow-up with neurology -Neuro interventional radiology recommended outpatient follow-up for ICA stenting -Continue dysphagia 1 diet -Started Prozac 20 mg daily on 7/12   Non-STEMI: Noted to have new ST depressions in V6 and worsening T wave inversions in inferior leads.  Seen by cardiology and underwent left  heart cath on 6/14 that showed multivessel disease. -Cardiology recs-medical management and outpatient follow-up   Acute respiratory failure with hypoxia-initially due to influenza A pneumonia followed by staph pneumonia and aspiration in the setting of dysphagia.  Completed 5 days of IV Unasyn on 7/8.  Resolved. 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  Currently on dysphagia 1 diet. -P.o. intake  improved.  Maintaining adequate caloric intake orally.   ESBL Klebsiella UTI -Completed course with 2 days of cefepime, 3 days of meropenem and fosfomycin on 6/29    RLE peroneal DVT-age-indeterminate.   -Decision is made to treat given high risk for extension due to immobility   Prediabetes: A1c 6.2%.  Does not seem to be on medication at home. Recent Labs  Lab 01/05/21 1153 01/05/21 1548 01/05/21 2119 01/06/21 0615 01/06/21 1123  GLUCAP 186* 134* 130* 119* 223*  -Continue current insulin regimen and a statin. -Adjust insulin as appropriate  Urinary retention, recurrent: Failed multiple voiding trials.  Foley catheter placed back again on 7/2.   -Continue Flomax -Outpatient follow-up with urology for voiding trial   Hypertension/NSVT: TTE with LVEF 55-60%.  Check TSH: 0.833. Normotensive. -Continue metoprolol -Optimize electrolytes  AKI: Resolved  Goals of care -Seen by palliative.  Full scope of care.   Diarrhea: Resolved  Abnormal LFTs: Improved -Check LFT periodically   Hypokalemia: Resolved  Nutrition: See dysphagia above Body mass index is 31.83 kg/m. Nutrition Problem: Inadequate oral intake Etiology: inability to eat Signs/Symptoms: NPO status Interventions: Tube feeding, Prostat  Pressure skin injury: POA Pressure Injury 12/23/20 Nare Left;Lower Stage 2 -  Partial thickness loss of dermis presenting as a shallow open injury with a red, pink wound bed without slough. Injury to the left nare where cortrak tie made contact with the skin. (Active)  12/23/20 1800  Location: Nare  Location Orientation: Left;Lower  Staging: Stage 2 -  Partial thickness loss of dermis presenting as a shallow open injury with a red, pink wound bed without slough.  Wound Description (Comments): Injury to the left nare where cortrak tie made contact with the skin.  Present on Admission: No   DVT prophylaxis:   apixaban (ELIQUIS) tablet 5 mg   Code Status: Full code Family  Communication: Patient and/or RN.  None at bedside today Level of care: Med-Surg Status is: Inpatient  Remains inpatient appropriate because:Unsafe d/c plan  Dispo: The patient is from: Home              Anticipated d/c is to: SNF pending insurance authorization              Patient currently is medically stable to d/c.   Difficult to place patient No       Consultants:  Neurology-signed off Cardiology-signed off   Sch Meds:  Scheduled Meds:  apixaban  5 mg Oral BID   arformoterol  15 mcg Nebulization BID   aspirin  81 mg Oral Daily   atorvastatin  80 mg Oral Daily   budesonide (PULMICORT) nebulizer solution  0.5 mg Nebulization BID   chlorhexidine  15 mL Mouth Rinse BID   Chlorhexidine Gluconate Cloth  6 each Topical Daily   docusate sodium  100 mg Oral Daily   ezetimibe  10 mg Oral Daily   ferrous sulfate  325 mg Oral BID WC   FLUoxetine  20 mg Oral Daily   gabapentin  300 mg Oral BID   insulin  aspart  0-5 Units Subcutaneous QHS   insulin aspart  0-9 Units Subcutaneous TID WC   insulin glargine  5 Units Subcutaneous BID   mouth rinse  15 mL Mouth Rinse q12n4p   metoprolol tartrate  25 mg Oral BID   montelukast  10 mg Oral QHS   multivitamin with minerals  1 tablet Oral Daily   pantoprazole  40 mg Oral QHS   QUEtiapine  12.5 mg Oral QHS   sodium chloride flush  3 mL Intravenous Q12H   tamsulosin  0.4 mg Oral QPC supper   Continuous Infusions:  sodium chloride     sodium chloride 50 mL/hr at 01/05/21 1132   PRN Meds:.sodium chloride, acetaminophen, albuterol, food thickener, guaiFENesin-dextromethorphan, nitroGLYCERIN, ondansetron (ZOFRAN) IV, polyethylene glycol, senna-docusate, sodium chloride flush  Antimicrobials: Anti-infectives (From admission, onward)    Start     Dose/Rate Route Frequency Ordered Stop   12/27/20 2300  Ampicillin-Sulbactam (UNASYN) 3 g in sodium chloride 0.9 % 100 mL IVPB  Status:  Discontinued        3 g 200 mL/hr over 30 Minutes  Intravenous Every 6 hours 12/27/20 2121 01/01/21 1207   12/23/20 1500  fosfomycin (MONUROL) packet 3 g        3 g Oral  Once 12/23/20 1314 12/23/20 1755   12/20/20 1400  meropenem (MERREM) 1 g in sodium chloride 0.9 % 100 mL IVPB  Status:  Discontinued        1 g 200 mL/hr over 30 Minutes Intravenous Every 8 hours 12/20/20 1123 12/23/20 1314   12/18/20 1100  vancomycin (VANCOREADY) IVPB 1750 mg/350 mL  Status:  Discontinued        1,750 mg 175 mL/hr over 120 Minutes Intravenous Every 24 hours 12/18/20 1002 12/20/20 1626   12/18/20 1045  ceFEPIme (MAXIPIME) 2 g in sodium chloride 0.9 % 100 mL IVPB  Status:  Discontinued        2 g 200 mL/hr over 30 Minutes Intravenous Every 8 hours 12/18/20 0958 12/20/20 1051   12/09/20 0000  cefTRIAXone (ROCEPHIN) 2 g in sodium chloride 0.9 % 100 mL IVPB        2 g 200 mL/hr over 30 Minutes Intravenous Every 24 hours 12/08/20 1044 12/10/20 0003   12/06/20 0000  cefTRIAXone (ROCEPHIN) 2 g in sodium chloride 0.9 % 100 mL IVPB  Status:  Discontinued        2 g 200 mL/hr over 30 Minutes Intravenous Every 24 hours 12/05/20 0033 12/08/20 1044   12/05/20 1000  oseltamivir (TAMIFLU) 6 MG/ML suspension 75 mg        75 mg Per Tube 2 times daily 12/05/20 0755 12/09/20 2157   12/05/20 0200  azithromycin (ZITHROMAX) 500 mg in sodium chloride 0.9 % 250 mL IVPB  Status:  Discontinued        500 mg 250 mL/hr over 60 Minutes Intravenous Every 24 hours 12/05/20 0033 12/08/20 1004   12/05/20 0130  cefTRIAXone (ROCEPHIN) 2 g in sodium chloride 0.9 % 100 mL IVPB        2 g 200 mL/hr over 30 Minutes Intravenous  Once 12/05/20 0035 12/05/20 0145   12/03/20 1030  oseltamivir (TAMIFLU) capsule 75 mg  Status:  Discontinued        75 mg Oral 2 times daily 12/03/20 0929 12/05/20 0755        I have personally reviewed the following labs and images: CBC: Recent Labs  Lab 12/31/20 0057 01/02/21 0109 01/04/21 0419  WBC  3.8* 3.3* 3.4*  HGB 9.7* 9.7* 11.5*  HCT 31.4* 31.6*  36.7*  MCV 89.7 90.3 88.6  PLT 201 193 162   BMP &GFR Recent Labs  Lab 12/31/20 0057 01/02/21 0109 01/04/21 0419  NA 138  --  137  K 3.9  --  3.7  CL 103  --  101  CO2 29  --  30  GLUCOSE 138*  --  135*  BUN 13  --  12  CREATININE 0.50*  --  0.53*  CALCIUM 8.0*  --  8.5*  MG 2.0 1.9 2.1  PHOS 3.1  --  4.1   Estimated Creatinine Clearance: 103.3 mL/min (A) (by C-G formula based on SCr of 0.53 mg/dL (L)). Liver & Pancreas: Recent Labs  Lab 12/31/20 0057 01/02/21 0109 01/04/21 0419  AST 48* 42* 35  ALT 67* 56* 49*  ALKPHOS 68 65 72  BILITOT 0.6 0.7 0.8  PROT 5.3* 5.5* 6.0*  ALBUMIN 2.0* 2.1* 2.4*   No results for input(s): LIPASE, AMYLASE in the last 168 hours. No results for input(s): AMMONIA in the last 168 hours. Diabetic: No results for input(s): HGBA1C in the last 72 hours. Recent Labs  Lab 01/05/21 1153 01/05/21 1548 01/05/21 2119 01/06/21 0615 01/06/21 1123  GLUCAP 186* 134* 130* 119* 223*   Cardiac Enzymes: No results for input(s): CKTOTAL, CKMB, CKMBINDEX, TROPONINI in the last 168 hours. No results for input(s): PROBNP in the last 8760 hours. Coagulation Profile: No results for input(s): INR, PROTIME in the last 168 hours. Thyroid Function Tests: No results for input(s): TSH, T4TOTAL, FREET4, T3FREE, THYROIDAB in the last 72 hours. Lipid Profile: No results for input(s): CHOL, HDL, LDLCALC, TRIG, CHOLHDL, LDLDIRECT in the last 72 hours. Anemia Panel: Recent Labs    01/04/21 0419  VITAMINB12 861  FOLATE 16.2  FERRITIN 31  TIBC 382  IRON 48  RETICCTPCT 2.8   Urine analysis:    Component Value Date/Time   COLORURINE YELLOW 12/18/2020 0835   APPEARANCEUR CLEAR 12/18/2020 0835   LABSPEC 1.020 12/18/2020 0835   PHURINE 6.0 12/18/2020 0835   GLUCOSEU NEGATIVE 12/18/2020 0835   HGBUR LARGE (A) 12/18/2020 0835   BILIRUBINUR NEGATIVE 12/18/2020 0835   KETONESUR NEGATIVE 12/18/2020 0835   PROTEINUR 100 (A) 12/18/2020 0835   NITRITE POSITIVE  (A) 12/18/2020 0835   LEUKOCYTESUR SMALL (A) 12/18/2020 0835   Sepsis Labs: Invalid input(s): PROCALCITONIN, LACTICIDVEN  Microbiology: No results found for this or any previous visit (from the past 240 hour(s)).  Radiology Studies: No results found.    Brandon Daniel T. Chetan Mehring [facility]  If 7PM-7AM, please contact night-coverage [email or web address] 01/06/2021, 12:52 PM

## 2021-01-06 NOTE — Progress Notes (Signed)
Occupational Therapy Treatment Patient Details Name: Brandon Daniel MRN: [id number] DOB: 1951/06/08 Today's Date: 01/06/2021    History of present illness Brandon Daniel is a 70 y.o. male admitted 6/9 that  presented with SOB, was found to be Influenza A + and later on day of admission developed mild R facial droop, R arm drift and hand grip weakness along with R leg weakness. Left MCA and ACA CVA. Pt received tPa. VDRF 6/11-6/15. PMH: CAD, HTN, lumbar stenosis, HLD   OT comments  Patient is making incremental progress toward patient focused OT goals.  Bulk of the session was for PROM to R upper extremity to all pivots.  Patient with increased pain to R hand and fingers with stretch.  Shoulder flexion and Abduction was to 90 degrees.  Patient needing up to Min A for rolling side to side and max A for repositioning in bed for comfort.  SNF continues to be recommended given +2 need for basic mobility.  OT will continue to follow acutely to maximize his functional status.    Follow Up Recommendations  SNF    Equipment Recommendations  Wheelchair cushion (measurements OT);Wheelchair (measurements OT);Hospital bed    Recommendations for Other Services      Precautions / Restrictions Precautions Precautions: Fall Precaution Comments: R hemi Restrictions Weight Bearing Restrictions: No       Mobility Bed Mobility Overal bed mobility: Needs Assistance Bed Mobility: Rolling Rolling: Min assist           Patient Response: Flat affect  Transfers                                                                                           Exercises General Exercises - Upper Extremity Shoulder Flexion: PROM;Right;Supine;10 reps Shoulder Extension: PROM;Right;10 reps;Supine Shoulder ABduction: PROM;Right;10 reps;Supine Shoulder ADduction: PROM;Right;10 reps;Supine Elbow Flexion: PROM;Right;10 reps Elbow Extension: PROM;Right;10 reps Wrist  Flexion: PROM;Right;15 reps Wrist Extension: PROM;Right;15 reps Digit Composite Flexion: PROM;Right;15 reps Composite Extension: PROM;Right;15 reps   Shoulder Instructions       General Comments      Pertinent Vitals/ Pain       Pain Assessment: Faces Faces Pain Scale: Hurts even more Pain Location: R hand/wrist Pain Descriptors / Indicators: Discomfort;Grimacing Pain Intervention(s): Monitored during session                                                          Frequency  Min 2X/week        Progress Toward Goals  OT Goals(current goals can now be found in the care plan section)     Acute Rehab OT Goals Patient Stated Goal: none stated OT Goal Formulation: Patient unable to participate in goal setting Time For Goal Achievement: 01/08/21 Potential to Achieve Goals: Good  Plan Discharge plan remains appropriate    Co-evaluation                 AM-PAC OT "6 Clicks" Daily Activity  Outcome Measure   Help from another person eating meals?: A Lot Help from another person taking care of personal grooming?: A Lot Help from another person toileting, which includes using toliet, bedpan, or urinal?: Total Help from another person bathing (including washing, rinsing, drying)?: A Lot Help from another person to put on and taking off regular upper body clothing?: A Lot Help from another person to put on and taking off regular lower body clothing?: Total 6 Click Score: 10    End of Session    OT Visit Diagnosis: Unsteadiness on feet (R26.81);Hemiplegia and hemiparesis;Cognitive communication deficit (R41.841) Hemiplegia - Right/Left: Right Hemiplegia - dominant/non-dominant: Dominant Hemiplegia - caused by: Cerebral infarction   Activity Tolerance Patient tolerated treatment well   Patient Left in bed;with call bell/phone within reach   Nurse Communication          Time: 1123-1140 OT Time Calculation (min): 17  min  Charges: OT General Charges $OT Visit: 1 Visit OT Treatments $Therapeutic Activity: 8-22 mins  01/06/2021  Rich, OTR/L  Acute Rehabilitation Services  Office:  [phone]    [name] 01/06/2021, 11:47 AM

## 2021-01-06 NOTE — Progress Notes (Signed)
Physical Therapy Treatment Patient Details Name: Brandon Daniel MRN: [id number] DOB: Oct 08, 1950 Today's Date: 01/06/2021    History of Present Illness Mr. Cian Costanzo is a 70 y.o. male admitted 6/9 that  presented with SOB, was found to be Influenza A + and later on day of admission developed mild R facial droop, R arm drift and hand grip weakness along with R leg weakness. Left MCA and ACA CVA. Pt received tPa. VDRF 6/11-6/15. PMH: CAD, HTN, lumbar stenosis, HLD    PT Comments    Patient seen in conjunction with OT to progress from EOB. Performed rolling L/R x3 each side with min-maxA. Patient required maxA+2 for sit to stand from EOB with blocking of R knee and HHAx2. Patient continues to demonstrate pushing with L UE if given opportunity on bed rail or on bed, improves with L UE in lap. Continue to recommend SNF for ongoing Physical Therapy.      Follow Up Recommendations  SNF;Other (comment) (per family request)     Equipment Recommendations  Other (comment) (defer to next venue of care)    Recommendations for Other Services       Precautions / Restrictions Precautions Precautions: Fall Precaution Comments: R hemi Restrictions Weight Bearing Restrictions: No    Mobility  Bed Mobility Overal bed mobility: Needs Assistance Bed Mobility: Rolling;Sidelying to Sit;Sit to Sidelying Rolling: Min assist;Max assist Sidelying to sit: Mod assist;+2 for physical assistance     Sit to sidelying: +2 for physical assistance;Max assist General bed mobility comments: Min A to roll to the Right and Max A to roll Left. ModA+2 for bringing LEs off bed and trunk elevation. MaxA+2 to return to supine due to fatigu    Transfers Overall transfer level: Needs assistance Equipment used: 2 person hand held assist Transfers: Sit to/from Stand;Lateral/Scoot Transfers Sit to Stand: Max assist;+2 physical assistance        Lateral/Scoot Transfers: Max assist;+2 physical assistance General  transfer comment: maxA+2 to stand with blocking of R knee to prevent buckling. No activation noted in standing. MaxA+2 to lateral scoot towards [facility]  Ambulation/Gait             General Gait Details: unable   Stairs             Wheelchair Mobility    Modified Rankin (Stroke Patients Only) Modified Rankin (Stroke Patients Only) Pre-Morbid Rankin Score: No symptoms Modified Rankin: Severe disability     Balance Overall balance assessment: Needs assistance Sitting-balance support: Feet supported;No upper extremity supported;Single extremity supported Sitting balance-Leahy Scale: Poor Sitting balance - Comments: requires up to maxA to maintain sitting EOB and maintain midline. At best minA with both hands in lap Postural control: Right lateral lean;Posterior lean Standing balance support: Bilateral upper extremity supported;During functional activity Standing balance-Leahy Scale: Zero Standing balance comment: max A +2 to maintain standing                            Cognition Arousal/Alertness: Awake/alert Behavior During Therapy: Flat affect Overall Cognitive Status: Difficult to assess Area of Impairment: Attention;Following commands;Safety/judgement;Awareness;Problem solving                   Current Attention Level: Sustained   Following Commands: Follows one step commands with increased time Safety/Judgement: Decreased awareness of safety Awareness: Intellectual Problem Solving: Decreased initiation;Requires verbal cues;Requires tactile cues        Exercises Other Exercises Other Exercises: Reaching across body and forward  with L UE    General Comments        Pertinent Vitals/Pain Pain Assessment: Faces Faces Pain Scale: Hurts even more Pain Location: R hand/wrist Pain Descriptors / Indicators: Discomfort;Grimacing Pain Intervention(s): Limited activity within patient's tolerance;Monitored during session;Repositioned    Home  Living                      Prior Function            PT Goals (current goals can now be found in the care plan section) Acute Rehab PT Goals Patient Stated Goal: none stated PT Goal Formulation: With family Time For Goal Achievement: 01/06/21 Potential to Achieve Goals: Fair Progress towards PT goals: Progressing toward goals    Frequency    Min 3X/week      PT Plan Current plan remains appropriate    Co-evaluation   Reason for Co-Treatment: Complexity of the patient's impairments (multi-system involvement);For patient/therapist safety   OT goals addressed during session: ADL's and self-care;Strengthening/ROM      AM-PAC PT "6 Clicks" Mobility   Outcome Measure  Help needed turning from your back to your side while in a flat bed without using bedrails?: A Lot Help needed moving from lying on your back to sitting on the side of a flat bed without using bedrails?: Total Help needed moving to and from a bed to a chair (including a wheelchair)?: Total Help needed standing up from a chair using your arms (e.g., wheelchair or bedside chair)?: Total Help needed to walk in hospital room?: Total Help needed climbing 3-5 steps with a railing? : Total 6 Click Score: 7    End of Session Equipment Utilized During Treatment: Gait belt Activity Tolerance: Patient tolerated treatment well;Patient limited by fatigue Patient left: in bed;with call bell/phone within reach;with bed alarm set;with family/visitor present Nurse Communication: Mobility status PT Visit Diagnosis: Muscle weakness (generalized) (M62.81);Hemiplegia and hemiparesis Hemiplegia - Right/Left: Right Hemiplegia - dominant/non-dominant: Dominant Hemiplegia - caused by: Cerebral infarction     Time: 3354-5625 PT Time Calculation (min) (ACUTE ONLY): 34 min  Charges:  $Therapeutic Activity: 8-22 mins                     Stepheni Cameron A. Dan Humphreys PT, DPT Acute Rehabilitation Services Pager  [phone] Office [phone]    Viviann Spare 01/06/2021, 5:33 PM

## 2021-01-06 NOTE — TOC Progression Note (Addendum)
Transition of Care [facility]) - Progression Note    Patient Details  Name: Brandon Daniel MRN: [id number] Date of Birth: March 26, 1951  Transition of Care [facility]) CM/SW Contact  Erin Sons, [place] Phone Number: 01/06/2021, 10:32 AM  Clinical Narrative:     Royce Macadamia and notified that [facility] rescinded bed offer. Discussed additional offers; he chooses [place]. He states pt is covid vaccinated with 1 booster. CSW contacted [employer]; they will review pt and contact CSW back.   1319:  [facility] liaison confirmed they could accept pt. They can accept tomorrow if Berkley Harvey is received. Malena Peer will start auth  Expected Discharge Plan: Skilled Nursing Facility Barriers to Discharge: Insurance Authorization  Expected Discharge Plan and Services Expected Discharge Plan: Skilled Nursing Facility In-house Referral: Clinical Social Work   Post Acute Care Choice: Long Term Acute Care (LTAC) Living arrangements for the past 2 months: Single Family Home                                       Social Determinants of Health (SDOH) Interventions    Readmission Risk Interventions No flowsheet data found.

## 2021-01-06 NOTE — Progress Notes (Signed)
Occupational Therapy Treatment Patient Details Name: Brandon Daniel MRN: [id number] DOB: 1950/12/13 Today's Date: 01/06/2021    History of present illness [name] is a 70 y.o. male admitted 6/9 that  presented with SOB, was found to be Influenza A + and later on day of admission developed mild R facial droop, R arm drift and hand grip weakness along with R leg weakness. Left MCA and ACA CVA. Pt received tPa. VDRF 6/11-6/15. PMH: CAD, HTN, lumbar stenosis, HLD   OT comments  Patient seen for second visit in conjunction with PT for bed mobility, edge of bed sit, and attempted sit to stand.  Patient was Max A of 2 for sit to supine and sit to stand.  Patient able to hold midline for a few moments, but needing up to Mod A to maintain balance during reaching tasks.  Barriers are listed below, but as indicated prior, OT will continue to see acutely, and recommend SNF for post acute rehab.  Patient with BM this date, and cleaned with sheets changed.    Follow Up Recommendations  SNF    Equipment Recommendations  Wheelchair cushion (measurements OT);Wheelchair (measurements OT);Hospital bed    Recommendations for Other Services      Precautions / Restrictions Precautions Precautions: None Precaution Comments: R hemi Restrictions Weight Bearing Restrictions: No       Mobility Bed Mobility Overal bed mobility: Needs Assistance Bed Mobility: Rolling;Sidelying to Sit;Sit to Sidelying Rolling: Min assist;Max assist (Min A to roll to the Right and Max A to roll Left) Sidelying to sit: Mod assist;+2 for physical assistance     Sit to sidelying: +2 for physical assistance;Max assist   Patient Response: Flat affect;Cooperative  Transfers Overall transfer level: Needs assistance Equipment used: 2 person hand held assist   Sit to Stand: +2 physical assistance;Max assist              Balance Overall balance assessment: Needs assistance Sitting-balance support: Feet  supported;No upper extremity supported;Single extremity supported Sitting balance-Leahy Scale: Fair   Postural control: Right lateral lean;Posterior lean Standing balance support: Bilateral upper extremity supported;During functional activity Standing balance-Leahy Scale: Zero                                 Exercises General Exercises - Upper Extremity Shoulder Flexion: PROM;Right;Supine;10 reps Shoulder Extension: PROM;Right;10 reps;Supine Shoulder ABduction: PROM;Right;10 reps;Supine Shoulder ADduction: PROM;Right;10 reps;Supine Elbow Flexion: PROM;Right;10 reps Elbow Extension: PROM;Right;10 reps Wrist Flexion: PROM;Right;15 reps Wrist Extension: PROM;Right;15 reps Digit Composite Flexion: PROM;Right;15 reps Composite Extension: PROM;Right;15 reps   Shoulder Instructions       General Comments      Pertinent Vitals/ Pain       Pain Assessment: Faces Faces Pain Scale: Hurts even more Pain Location: R hand/wrist Pain Descriptors / Indicators: Discomfort;Grimacing Pain Intervention(s): Monitored during session                                                          Frequency  Min 2X/week        Progress Toward Goals  OT Goals(current goals can now be found in the care plan section)  Progress towards OT goals: Progressing toward goals  Acute Rehab OT Goals Patient Stated Goal: none stated OT Goal  Formulation: Patient unable to participate in goal setting Time For Goal Achievement: 01/08/21 Potential to Achieve Goals: Good  Plan Discharge plan remains appropriate    Co-evaluation    PT/OT/SLP Co-Evaluation/Treatment: Yes Reason for Co-Treatment: Complexity of the patient's impairments (multi-system involvement);For patient/therapist safety   OT goals addressed during session: ADL's and self-care;Strengthening/ROM      AM-PAC OT "6 Clicks" Daily Activity     Outcome Measure   Help from another person eating  meals?: A Lot Help from another person taking care of personal grooming?: A Lot Help from another person toileting, which includes using toliet, bedpan, or urinal?: Total Help from another person bathing (including washing, rinsing, drying)?: A Lot Help from another person to put on and taking off regular upper body clothing?: A Lot Help from another person to put on and taking off regular lower body clothing?: Total 6 Click Score: 10    End of Session Equipment Utilized During Treatment: Gait belt  OT Visit Diagnosis: Unsteadiness on feet (R26.81);Hemiplegia and hemiparesis;Cognitive communication deficit (R41.841) Hemiplegia - Right/Left: Right Hemiplegia - dominant/non-dominant: Dominant Hemiplegia - caused by: Cerebral infarction   Activity Tolerance Patient tolerated treatment well   Patient Left in bed;with call bell/phone within reach;with bed alarm set;with family/visitor present   Nurse Communication          Time: 5035-4656 OT Time Calculation (min): 34 min  Charges: OT General Charges $OT Visit: 1 Visit OT Treatments $Therapeutic Activity: 8-22 mins  01/06/2021  Rich, OTR/L  Acute Rehabilitation Services  Office:  [phone]    [name] 01/06/2021, 3:32 PM

## 2021-01-06 NOTE — Plan of Care (Signed)
  Problem: Self-Care: Goal: Ability to communicate needs accurately will improve Outcome: Progressing   Problem: Nutrition: Goal: Risk of aspiration will decrease Outcome: Progressing Goal: Dietary intake will improve Outcome: Progressing   Problem: Ischemic Stroke/TIA Tissue Perfusion: Goal: Complications of ischemic stroke/TIA will be minimized Outcome: Progressing   

## 2021-01-07 DIAGNOSIS — J4540 Moderate persistent asthma, uncomplicated: Secondary | ICD-10-CM

## 2021-01-07 DIAGNOSIS — R7303 Prediabetes: Secondary | ICD-10-CM

## 2021-01-07 DIAGNOSIS — R1312 Dysphagia, oropharyngeal phase: Secondary | ICD-10-CM

## 2021-01-07 LAB — GLUCOSE, CAPILLARY
Glucose-Capillary: 139 mg/dL — ABNORMAL HIGH (ref 70–99)
Glucose-Capillary: 192 mg/dL — ABNORMAL HIGH (ref 70–99)
Glucose-Capillary: 157 mg/dL — ABNORMAL HIGH (ref 70–99)
Glucose-Capillary: 108 mg/dL — ABNORMAL HIGH (ref 70–99)

## 2021-01-07 MED ORDER — METFORMIN HCL 500 MG PO TABS: 500.0000 mg | ORAL_TABLET | Freq: Two times a day (BID) | ORAL | 11 refills | Status: AC

## 2021-01-07 MED ORDER — QUETIAPINE FUMARATE 25 MG PO TABS: 12.5000 mg | ORAL_TABLET | Freq: Every day | ORAL | Status: DC

## 2021-01-07 MED ORDER — GABAPENTIN 300 MG PO CAPS: 300.0000 mg | ORAL_CAPSULE | Freq: Two times a day (BID) | ORAL | Status: AC

## 2021-01-07 MED ORDER — IPRATROPIUM-ALBUTEROL 0.5-2.5 (3) MG/3ML IN SOLN
3.0000 mL | RESPIRATORY_TRACT | Status: DC | PRN
Start: 2021-01-07 — End: 2021-09-03

## 2021-01-07 MED ORDER — METOPROLOL TARTRATE 25 MG PO TABS: 25.0000 mg | ORAL_TABLET | Freq: Two times a day (BID) | ORAL | Status: DC

## 2021-01-07 MED ORDER — DOCUSATE SODIUM 100 MG PO CAPS: 100.0000 mg | ORAL_CAPSULE | Freq: Every day | ORAL | 0 refills | Status: AC

## 2021-01-07 MED ORDER — ARFORMOTEROL TARTRATE 15 MCG/2ML IN NEBU: 15.0000 ug | INHALATION_SOLUTION | Freq: Two times a day (BID) | RESPIRATORY_TRACT | Status: AC

## 2021-01-07 MED ORDER — BUDESONIDE 0.5 MG/2ML IN SUSP: 0.5000 mg | Freq: Two times a day (BID) | RESPIRATORY_TRACT | 12 refills | Status: AC

## 2021-01-07 MED ORDER — TAMSULOSIN HCL 0.4 MG PO CAPS: 0.4000 mg | ORAL_CAPSULE | Freq: Every day | ORAL | Status: AC

## 2021-01-07 MED ORDER — INSULIN ASPART 100 UNIT/ML IJ SOLN: 0.0000 [IU] | Freq: Three times a day (TID) | INTRAMUSCULAR | 11 refills | Status: DC

## 2021-01-07 MED ORDER — FLUOXETINE HCL 20 MG PO CAPS
20.0000 mg | ORAL_CAPSULE | Freq: Every day | ORAL | 1 refills | Status: DC
Start: 2021-01-08 — End: 2021-09-03

## 2021-01-07 MED ORDER — FERROUS SULFATE 325 (65 FE) MG PO TABS: 325.0000 mg | ORAL_TABLET | Freq: Two times a day (BID) | ORAL | 1 refills | Status: AC

## 2021-01-07 MED ORDER — SENNOSIDES-DOCUSATE SODIUM 8.6-50 MG PO TABS: 1.0000 | ORAL_TABLET | Freq: Two times a day (BID) | ORAL | Status: DC | PRN

## 2021-01-07 MED ORDER — PANTOPRAZOLE SODIUM 40 MG PO TBEC: 40.0000 mg | DELAYED_RELEASE_TABLET | Freq: Every day | ORAL | Status: DC

## 2021-01-07 MED ORDER — POLYETHYLENE GLYCOL 3350 17 G PO PACK: 17.0000 g | PACK | Freq: Two times a day (BID) | ORAL | 0 refills | Status: DC | PRN

## 2021-01-07 MED ORDER — INSULIN GLARGINE 100 UNIT/ML ~~LOC~~ SOLN: 5.0000 [IU] | Freq: Two times a day (BID) | SUBCUTANEOUS | 11 refills | Status: DC

## 2021-01-07 MED ORDER — APIXABAN 5 MG PO TABS: 5.0000 mg | ORAL_TABLET | Freq: Two times a day (BID) | ORAL | 0 refills | Status: DC

## 2021-01-07 NOTE — Progress Notes (Signed)
  Speech Language Pathology Treatment: Dysphagia;Cognitive-Linquistic  Patient Details Name: Brandon Daniel MRN: [id number] DOB: [birth year] Today's Date: 01/07/2021 Time: 1002-1030 SLP Time Calculation (min) (ACUTE ONLY): 28 min  Assessment / Plan / Recommendation Clinical Impression  Followed up for dysphagia and cognitive linguistic intervention. Clinically pt appears to make excellent gains in swallowing abilities and some gains with cognitive linguistics. Oral hygiene notably improved from previous encounters with this SLP. Pt alert, communicating with increased gestures and following some simple commands. Pt still with limited to no verbal output despite multimodal cues. Pt following approximately ~50 or greater simple step commands with multimodal cues for functional tasks.  Trialed nectar thick liquids via cup and straw following pt fully upright repositioning. Of note (during prior objective swallow study pts laryngeal and tracheal sensation was intact, sensing an event of aspiration just with weaker cough that was ineffective to clear at the time). Clinical trials of nectar thick liquids were completely without overt s/sx of aspiration and palpable swallows. No wetness appreciated during exhalation or with pt breath sounds. Pt able to masticate solid PO trial, though did exhibit mild pocketing; suspect pocketing would be worse across a meal. With cues including liquid alternation and lingual sweep, pt able to efficiently clear. Pts nurse tech notes pt with significant impulsivity at meals. Recommend diet advancement to nectar (mildly thick) and dysphagia 2 (finely chopped) with meds crushed in puree (nursing reports pt chews meds when offered whole in puree). Full supervision and oral care following PO remains necessary. SLP to closely monitor for diet tolerance and cognitive linguistic intervention.     HPI HPI: Illness [name] is a 70 y.o. male admitted 6/9 that  presented with SOB,  was found to be Influenza A + and later on day of admission developed mild R facial droop, R arm drift and hand grip weakness along with R leg weakness. Left MCA and ACA CVA. Pt received tPa. VDRF 6/11-6/15. PMH: CAD, HTN, lumbar stenosis, HLD      SLP Plan  Continue with current plan of care       Recommendations  Diet recommendations: Dysphagia 2 (fine chop);Nectar-thick liquid Liquids provided via: Cup;Straw Medication Administration: Crushed with puree Supervision: Patient able to self feed;Full supervision/cueing for compensatory strategies;Staff to assist with self feeding Compensations: Slow rate;Small sips/bites;Minimize environmental distractions;Lingual sweep for clearance of pocketing;Follow solids with liquid Postural Changes and/or Swallow Maneuvers: Seated upright 90 degrees;Upright 30-60 min after meal                Oral Care Recommendations: Oral care BID;Oral care before and after PO Follow up Recommendations: Skilled Nursing facility SLP Visit Diagnosis: Dysphagia, oropharyngeal phase (R13.12);Cognitive communication deficit (R41.841);Apraxia (R48.2) Plan: Continue with current plan of care       GO                Ardyth Gal MA, CCC-SLP Acute Rehabilitation Services   01/07/2021, 10:42 AM

## 2021-01-07 NOTE — Discharge Summary (Addendum)
Physician Discharge Summary  [place] JXB:[id number] DOB: [birth year] DOA: 12/03/2020  PCP: Pcp, No  Admit date: 12/03/2020 Discharge date: 01/12/2021  Admitted From: Home Disposition: SNF  Recommendations for Outpatient Follow-up:  Follow ups as below. Please obtain CBC/BMP/Mag at follow up Please follow up on the following pending results: None Please contact [facility] to schedule f/u appointment Routine foley care  Diet: Dysphagia 2  Discharge Condition: Stable CODE STATUS: Full code   Contact information for follow-up providers     de Glori Luis, MD Follow up.   Specialties: Radiology, Interventional Radiology Why: IR schedulers will call you to schedule cerebral angiogram after discharge. Please call [phone] or [phone] with any questions or concerns prior to your appointment. Contact information: [address] [place] [address] [phone]         [facility]. Schedule an appointment as soon as possible for a visit in 4 week(s).   Specialty: Neurology Contact information: [address] [place] [address] [phone]             Contact information for after-discharge care     Destination     [facility] .   Service: Skilled Nursing Contact information: [address] [place] [address] [phone]                     Hospital Course: 70 year old M with PMH of CAD, HTN and HLD presenting with shortness of breath and admitted for influenza A infection and elevated troponin.  The next day, noted to have acute change in neuro status with acute right-sided weakness.  Work-up indicated left MCA and ACA territory infarct.  Received tPA.  Patient also had severe left ICA stenosis.  Neuro interventional radiology recommended outpatient follow-up for possible stenting.  Patient was also found to  have RLE DVT for which he was started on Eliquis in addition to aspirin.  He will continue Eliquis at least for 3 months.  Neurology recommended starting Plavix once he completes course of Eliquis.    On 6/10, patient developed respiratory failure requiring intubation, and found to have bilateral lower lobe pneumonia.  Treated with antibiotics and diuretics.  Subsequently extubated on 6/17 and transferred to [facility] service on 6/22.  Has had recurrence of fever and found to have Klebsiella ESBL UTI and staph pneumonia.  Started on meropenem and completed course.  Patient had dysphagia and started on TF via cortrak.  However, cortrak fell out on 7/2.  Upgraded to dysphagia 2 diet by SLP.  Has good p.o. intake.  He needs assistant with feeding.    Patient is discharged to SNF.   Outpatient follow-ups as above.  See individual problem list below for more on hospital course. Discharge Diagnoses:  Left MCA/ACA territory stroke-dysphagia, aphasia, right facial droop and right hemiparesis. 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  LDL 55.  A1c 6.2%. -On Eliquis and aspirin.  Neurology recommended DAPT once he completes Eliquis course for DVT -Recommend outpatient follow-up with neurology -Neuro interventional radiology recommended outpatient follow-up for ICA stenting -Advanced to dysphagia 2 diet on 7/14. -Continued on Prozac 20 mg daily on 7/12   Non-STEMI: Noted to have new ST depressions in V6 and worsening T wave inversions in inferior leads.  Seen by cardiology and underwent left heart cath on 6/14 that showed multivessel disease. -Remains stable  Acute respiratory failure with hypoxia-initially due to influenza A pneumonia followed by staph pneumonia and aspiration in the setting of dysphagia.  Completed 5 days of IV Unasyn on 7/8.  Resolved. -Treat treatable causes and continue aspiration precaution -Continue dysphagia 1 diet    Dysphagia due to CVA-cortrack fell off on 7/2.  Advanced to dysphagia 2 diet on 7/14. -P.o. intake improved.  Maintaining adequate caloric intake orally.   ESBL Klebsiella UTI -Completed course with 2 days of cefepime, 3 days of meropenem and fosfomycin on 6/29    RLE peroneal DVT-age-indeterminate.   -Decision is made to treat given high risk for extension due to immobility   Prediabetes: A1c 6.2%.  Does not seem to be on medication at home.  -Continue current insulin regimen and a statin. -Continue to titrate insulin as needed   Urinary retention, recurrent: Failed multiple voiding trials.  Foley catheter placed back again on 7/2.   -Continue Flomax -Outpatient follow-up with urology for voiding trial and for f/u for hematuria below   Hypertension/NSVT: TTE with LVEF 55-60%.  Check TSH: 0.833. Normotensive. -Continue metoprolol -Optimize electrolytes   AKI: Resolved   Goals of care -Seen by palliative.  Full scope of care.   Diarrhea: Resolved   Abnormal LFTs: Improved -Check LFT periodically   Hypokalemia: Resolved   Nutrition: See dysphagia above Body mass index is 30.72 kg/m. Nutrition Problem: Inadequate oral intake Etiology: inability to eat Signs/Symptoms: NPO status Interventions: Tube feeding, Prostat   Pressure skin injury: POA Pressure Injury 12/23/20 Nare Left;Lower Stage 2 -  Partial thickness loss of dermis presenting as a shallow open injury with a red, pink wound bed without slough. Injury to the left nare where cortrak tie made contact with the skin. (Active)  12/23/20 1800  Location: Nare  Location Orientation: Left;Lower  Staging: Stage 2 -  Partial thickness loss of dermis presenting as a shallow open injury with a red, pink wound bed without slough.  Wound Description (Comments): Injury to the left nare where cortrak tie made contact with the skin.  Present on Admission: No    Hematuria -Pink colored urine noted in foley tubing while on  anticoagulation -Noted on 7/19 -Hgb trending up, up to 11.5 from 9.7 - Called and discussed case with Urology, Dr. Marlou Porch, who recommends close outpatient follow up with Urology on d/c.  -Would recommend foley wean/bladder training at SNF. Per Urology, could contact [facility] if any issues while weaning foley -OK to continue antiplatelet/anticoagulant at time of d/c per Urology   Discharge Exam: Vitals:   01/12/21 1008 01/12/21 1218  BP: (!) 161/68 (!) 143/80  Pulse:  96  Resp:  16  Temp:  97.6 F (36.4 C)  SpO2:  94%    General exam: Awake, laying in bed, in nad Respiratory system: Normal respiratory effort, no wheezing Cardiovascular system: regular rate, s1, s2 Gastrointestinal system: Soft, nondistended, positive BS Central nervous system: CN2-12 grossly intact, strength intact Extremities: Perfused, no clubbing Skin: Normal skin turgor, no notable skin lesions seen Psychiatry: Mood normal // no visual hallucinations    Discharge Instructions  Discharge Instructions     Diet general   Complete by: As directed    Dysphagia 2 diet.   Increase activity slowly   Complete by: As directed    No wound care   Complete by: As directed       Allergies as of 01/12/2021   No Known Allergies      Medication List  STOP taking these medications    albuterol 108 (90 Base) MCG/ACT inhaler Commonly known as: VENTOLIN HFA   atenolol 100 MG tablet Commonly known as: TENORMIN   clopidogrel 75 MG tablet Commonly known as: PLAVIX   Fish Oil 1000 MG Cpdr   gabapentin 600 MG tablet Commonly known as: NEURONTIN Replaced by: gabapentin 300 MG capsule   hydrochlorothiazide 25 MG tablet Commonly known as: HYDRODIURIL   lisinopril 20 MG tablet Commonly known as: ZESTRIL   Stiolto Respimat 2.5-2.5 MCG/ACT Aers Generic drug: Tiotropium Bromide-Olodaterol Replaced by: arformoterol 15 MCG/2ML Nebu       TAKE these medications    alendronate 70 MG  tablet Commonly known as: FOSAMAX Take 70 mg by mouth once a week. Take with a full glass of water on an empty stomach.   apixaban 5 MG Tabs tablet Commonly known as: ELIQUIS Take 1 tablet (5 mg total) by mouth 2 (two) times daily.   arformoterol 15 MCG/2ML Nebu Commonly known as: BROVANA Take 2 mLs (15 mcg total) by nebulization 2 (two) times daily. Replaces: Stiolto Respimat 2.5-2.5 MCG/ACT Aers   aspirin EC 81 MG tablet Take 81 mg by mouth in the morning and at bedtime. Swallow whole.   atorvastatin 80 MG tablet Commonly known as: LIPITOR Take 80 mg by mouth daily.   budesonide 0.5 MG/2ML nebulizer solution Commonly known as: PULMICORT Take 2 mLs (0.5 mg total) by nebulization 2 (two) times daily.   docusate sodium 100 MG capsule Commonly known as: COLACE Take 1 capsule (100 mg total) by mouth daily.   ezetimibe 10 MG tablet Commonly known as: ZETIA Take 10 mg by mouth daily.   ferrous sulfate 325 (65 FE) MG tablet Take 1 tablet (325 mg total) by mouth 2 (two) times daily with a meal.   FLUoxetine 20 MG capsule Commonly known as: PROZAC Take 1 capsule (20 mg total) by mouth daily.   gabapentin 300 MG capsule Commonly known as: NEURONTIN Take 1 capsule (300 mg total) by mouth 2 (two) times daily. Replaces: gabapentin 600 MG tablet   ipratropium-albuterol 0.5-2.5 (3) MG/3ML Soln Commonly known as: DUONEB Take 3 mLs by nebulization every 4 (four) hours as needed.   metFORMIN 500 MG tablet Commonly known as: Glucophage Take 1 tablet (500 mg total) by mouth 2 (two) times daily with a meal.   metoprolol tartrate 25 MG tablet Commonly known as: LOPRESSOR Take 1 tablet (25 mg total) by mouth 2 (two) times daily.   montelukast 10 MG tablet Commonly known as: SINGULAIR Take 10 mg by mouth at bedtime.   nitroGLYCERIN 0.4 MG SL tablet Commonly known as: NITROSTAT Place 0.4 mg under the tongue every 5 (five) minutes as needed for chest pain.   pantoprazole 40 MG  tablet Commonly known as: PROTONIX Take 1 tablet (40 mg total) by mouth at bedtime.   polyethylene glycol 17 g packet Commonly known as: MIRALAX / GLYCOLAX Take 17 g by mouth 2 (two) times daily as needed for mild constipation.   QUEtiapine 25 MG tablet Commonly known as: SEROQUEL Take 0.5 tablets (12.5 mg total) by mouth at bedtime.   REFRESH OP Place 1 drop into both eyes daily.   senna-docusate 8.6-50 MG tablet Commonly known as: Senokot-S Take 1 tablet by mouth 2 (two) times daily as needed for moderate constipation.   tamsulosin 0.4 MG Caps capsule Commonly known as: FLOMAX Take 1 capsule (0.4 mg total) by mouth daily after supper.   Vitamin D 50 MCG (2000 UT)  tablet Take 2,000 Units by mouth daily.        Consultations: Neurology IR Cardiology Palliative medicine  Procedures/Studies:  DG Chest 1 View  Result Date: 12/17/2020 CLINICAL DATA:  Oxygen desaturation EXAM: CHEST  1 VIEW COMPARISON:  12/16/2020 FINDINGS: Cardiac shadow is stable. Postsurgical changes are again seen. Feeding catheter is noted extending into the stomach. Left basilar infiltrate is noted stable in appearance from the prior exam. Right lung base is clear when compare with the prior study. No new focal abnormality is seen. IMPRESSION: Stable left basilar infiltrate. Electronically Signed   By: Alcide Clever M.D.   On: 12/17/2020 18:34   DG CHEST PORT 1 VIEW  Result Date: 12/27/2020 CLINICAL DATA:  Cough. EXAM: PORTABLE CHEST 1 VIEW COMPARISON:  Radiograph 3 days ago 12/24/2020, additional priors. FINDINGS: Post median sternotomy. Borderline cardiomegaly with stable mediastinal contours. Coronary stent is seen. Blunting of the left costophrenic angle similar to prior exam with residual streaky basilar opacity. No new consolidation. No pulmonary edema or pneumothorax no acute osseous abnormalities are seen. IMPRESSION: 1. Similar small left pleural effusion. 2. Persistent streaky opacities at the  left lung base, favoring atelectasis or scarring. 3. Otherwise stable exam. Electronically Signed   By: Narda Rutherford M.D.   On: 12/27/2020 21:10   DG CHEST PORT 1 VIEW  Result Date: 12/24/2020 CLINICAL DATA:  70 year old male with shortness of breath. EXAM: PORTABLE CHEST 1 VIEW COMPARISON:  Portable chest 12/17/2020 and earlier. FINDINGS: Improved lung volumes and left lung base ventilation with mild residual streaky opacity there, which is increased compared to April this year. Enteric feeding tube in place, courses to the abdomen with tip not included. Mediastinal contours remain normal. Prior sternotomy. No pneumothorax or pulmonary edema. Continued blunting of the left costophrenic angle suggestive of pleural effusion. The contralateral visible right angle appears to remain clear. Paucity of bowel gas in the upper abdomen. IMPRESSION: 1. Enteric feeding tube in place courses to the abdomen, tip not included. 2. Suspect continued small left pleural effusion. Streaky opacity at the left lung base has improved but not resolved, favor atelectasis. 3. No new cardiopulmonary abnormality. Electronically Signed   By: Odessa Fleming M.D.   On: 12/24/2020 09:37   DG CHEST PORT 1 VIEW  Result Date: 12/16/2020 CLINICAL DATA:  Influenza a with pneumonia. EXAM: PORTABLE CHEST 1 VIEW COMPARISON:  December 14, 2019. FINDINGS: Similar patchy airspace opacities in the left greater than right lung bases. Suspected small left pleural effusion, similar. No visible pneumothorax. Similar cardiomediastinal silhouette. Postsurgical changes of CABG. Enteric tube courses below the diaphragm and outside the field of view. IMPRESSION: 1. Similar patchy airspace opacities in the left greater than right lung bases, suspicious for pneumonia. 2. Suspected small left pleural effusion, similar to prior. Electronically Signed   By: Feliberto Harts MD   On: 12/16/2020 12:29   DG CHEST PORT 1 VIEW  Result Date: 12/13/2020 CLINICAL DATA:   Fever. EXAM: PORTABLE CHEST 1 VIEW COMPARISON:  December 08, 2020 FINDINGS: Post extubation. Feeding catheter transverses the thorax, collimated inferiorly. Calcific atherosclerotic disease and tortuosity of the aorta. Cardiomediastinal silhouette is normal. Mediastinal contours appear intact. Patchy peribronchial airspace opacities in bilateral lower lobes, left greater than right. Osseous structures are without acute abnormality. Soft tissues are grossly normal. IMPRESSION: Patchy peribronchial airspace opacities in bilateral lower lobes, left greater than right. Findings may represent aspiration or early pneumonia. Electronically Signed   By: Ted Mcalpine M.D.   On: 12/13/2020 18:16  DG Swallowing Func-Speech Pathology  Result Date: 12/22/2020 Formatting of this result is different from the original. Objective Swallowing Evaluation: Type of Study: MBS-Modified Barium Swallow Study  Patient Details Name: Brandon Daniel Date of Birth: [birth year] Today's Date: 12/22/2020 Time: SLP Start Time (ACUTE ONLY): 1310 -SLP Stop Time (ACUTE ONLY): 1335 SLP Time Calculation (min) (ACUTE ONLY): 25 min Past Medical History: Past Medical History: Diagnosis Date  Asthma-COPD overlap syndrome (HCC)   CAD (coronary artery disease)   HLD (hyperlipidemia)   HTN (hypertension)   Lumbar disc herniation with radiculopathy   Lumbar stenosis   Osteoporosis  Past Surgical History: Past Surgical History: Procedure Laterality Date  CATARACT EXTRACTION, BILATERAL  2021  CORONARY ARTERY BYPASS GRAFT  2005  INGUINAL HERNIA REPAIR  1998  LEFT HEART CATH AND CORS/GRAFTS ANGIOGRAPHY N/A 12/08/2020  Procedure: LEFT HEART CATH AND CORS/GRAFTS ANGIOGRAPHY;  Surgeon: Orpah Cobb, MD;  Location: [facility] INVASIVE CV LAB;  Service: Cardiovascular;  Laterality: N/A;  LUMBAR DISC SURGERY  2019 HPI: [name] is a 70 y.o. male admitted 6/9 that  presented with SOB, was found to be Influenza A + and later on day of admission  developed mild R facial droop, R arm drift and hand grip weakness along with R leg weakness. Left MCA and ACA CVA. Pt received tPa. VDRF 6/11-6/15. PMH: CAD, HTN, lumbar stenosis, HLD  Subjective: upright in chair for procedure Assessment / Plan / Recommendation CHL IP CLINICAL IMPRESSIONS 12/22/2020 Clinical Impression Pt presents with a moderate oropharyngeal dysphagia of neurogenic etiology and still exhibits some oral apraxia though greatly improving since admission. Orally pt with reduced labial seal with intermittent right anterior spillage, oral residuals (right greater than left), and reduced bolus cohesion. Pt was unable to propel initial trial of nectar thick by tsp to pharynx, necessitating larger volume for adequate oral transit. Cup sip of nectar thick liquid resulted in premature spillage with mistimed epiglottic inversion, poor airway protection and sensed but not cleared laryngeal penetration and tracheal aspiration. Pt with oral residuals across all POs. Honey thick liquids resulted in transient laryngeal penetration during the swallow, clearing reflexively. Intact airway protection noted with puree. Moderate oral residuals however appreciated. Trace vallecular and pyriform sinu residuals noted with POs. Recommend dysphagia 1 (puree) and honey thick liquids withs meds crushed in puree. Full supervision and full feeding asssitance. Pt requires oral care prior to and following PO consumption as likelihood for aspiration from residuals and pt saliva is increased. SLP to closely follow. SLP Visit Diagnosis Dysphagia, oropharyngeal phase (R13.12) Attention and concentration deficit following -- Frontal lobe and executive function deficit following -- Impact on safety and function Moderate aspiration risk   CHL IP TREATMENT RECOMMENDATION 12/22/2020 Treatment Recommendations Therapy as outlined in treatment plan below   Prognosis 12/22/2020 Prognosis for Safe Diet Advancement Good Barriers to Reach Goals  Severity of deficits;Time post onset Barriers/Prognosis Comment -- CHL IP DIET RECOMMENDATION 12/22/2020 SLP Diet Recommendations Honey thick liquids;Dysphagia 1 (Puree) solids Liquid Administration via Cup Medication Administration Crushed with puree Compensations Slow rate;Small sips/bites;Multiple dry swallows after each bite/sip;Follow solids with liquid Postural Changes Remain semi-upright after after feeds/meals (Comment);Seated upright at 90 degrees   CHL IP OTHER RECOMMENDATIONS 12/22/2020 Recommended Consults -- Oral Care Recommendations Oral care before and after PO;Staff/trained caregiver to provide oral care;Oral care BID Other Recommendations Order thickener from pharmacy   CHL IP FOLLOW UP RECOMMENDATIONS 12/22/2020 Follow up Recommendations Skilled Nursing facility;24 hour supervision/assistance   CHL IP FREQUENCY AND DURATION 12/22/2020  Speech Therapy Frequency (ACUTE ONLY) min 2x/week Treatment Duration 2 weeks      CHL IP ORAL PHASE 12/22/2020 Oral Phase Impaired Oral - Pudding Teaspoon -- Oral - Pudding Cup -- Oral - Honey Teaspoon -- Oral - Honey Cup Incomplete tongue to palate contact;Reduced posterior propulsion;Holding of bolus;Pocketing in anterior sulcus;Lingual/palatal residue;Delayed oral transit;Decreased bolus cohesion;Premature spillage;Right anterior bolus loss;Weak lingual manipulation Oral - Nectar Teaspoon Lingual/palatal residue;Decreased bolus cohesion;Holding of bolus;Reduced posterior propulsion;Weak lingual manipulation Oral - Nectar Cup Delayed oral transit;Decreased bolus cohesion;Lingual/palatal residue;Reduced posterior propulsion;Incomplete tongue to palate contact;Weak lingual manipulation;Right anterior bolus loss Oral - Nectar Straw -- Oral - Thin Teaspoon -- Oral - Thin Cup -- Oral - Thin Straw -- Oral - Puree Delayed oral transit;Lingual/palatal residue;Piecemeal swallowing;Reduced posterior propulsion;Incomplete tongue to palate contact;Weak lingual manipulation Oral -  Mech Soft -- Oral - Regular -- Oral - Multi-Consistency -- Oral - Pill -- Oral Phase - Comment --  CHL IP PHARYNGEAL PHASE 12/22/2020 Pharyngeal Phase Impaired Pharyngeal- Pudding Teaspoon -- Pharyngeal -- Pharyngeal- Pudding Cup -- Pharyngeal -- Pharyngeal- Honey Teaspoon -- Pharyngeal -- Pharyngeal- Honey Cup Delayed swallow initiation-pyriform sinuses;Reduced epiglottic inversion;Penetration/Aspiration during swallow;Pharyngeal residue - valleculae;Pharyngeal residue - pyriform;Reduced tongue base retraction;Reduced pharyngeal peristalsis Pharyngeal Material enters airway, remains ABOVE vocal cords then ejected out Pharyngeal- Nectar Teaspoon Other (Comment) Pharyngeal -- Pharyngeal- Nectar Cup Delayed swallow initiation-pyriform sinuses;Reduced epiglottic inversion;Reduced anterior laryngeal mobility;Reduced airway/laryngeal closure;Penetration/Aspiration before swallow;Penetration/Aspiration during swallow;Moderate aspiration;Pharyngeal residue - valleculae;Pharyngeal residue - pyriform Pharyngeal Material enters airway, passes BELOW cords and not ejected out despite cough attempt by patient Pharyngeal- Nectar Straw -- Pharyngeal -- Pharyngeal- Thin Teaspoon -- Pharyngeal -- Pharyngeal- Thin Cup -- Pharyngeal -- Pharyngeal- Thin Straw -- Pharyngeal -- Pharyngeal- Puree Delayed swallow initiation-vallecula;Reduced pharyngeal peristalsis;Reduced tongue base retraction;Pharyngeal residue - valleculae Pharyngeal -- Pharyngeal- Mechanical Soft -- Pharyngeal -- Pharyngeal- Regular -- Pharyngeal -- Pharyngeal- Multi-consistency -- Pharyngeal -- Pharyngeal- Pill -- Pharyngeal -- Pharyngeal Comment --  CHL IP CERVICAL ESOPHAGEAL PHASE 12/22/2020 Cervical Esophageal Phase WFL Pudding Teaspoon -- Pudding Cup -- Honey Teaspoon -- Honey Cup -- Nectar Teaspoon -- Nectar Cup -- Nectar Straw -- Thin Teaspoon -- Thin Cup -- Thin Straw -- Puree -- Mechanical Soft -- Regular -- Multi-consistency -- Pill -- Cervical Esophageal  Comment -- Chelsea E Hartness MA, CCC-SLP 12/22/2020, 2:14 PM              VAS [place] LOWER EXTREMITY VENOUS (DVT)  Result Date: 12/22/2020  Lower Venous DVT Study Patient Name:  Brandon Daniel  Date of Exam:   12/22/2020 Medical Rec #: [id number]      Accession #:    [id number] Date of Birth: [birth year]     Patient Gender: M Patient Age:   69Y Exam Location:  [facility] Procedure:      VAS [place] LOWER EXTREMITY VENOUS (DVT) Referring Phys: [id number] JEHANZEB MEMON --------------------------------------------------------------------------------  Indications: Follow up peroneal dvt.  Comparison Study: 12/18/20 prior Performing Technologist: [place] Ponder RVS  Examination Guidelines: A complete evaluation includes B-mode imaging, spectral Doppler, color Doppler, and power Doppler as needed of all accessible portions of each vessel. Bilateral testing is considered an integral part of a complete examination. Limited examinations for reoccurring indications may be performed as noted. The reflux portion of the exam is performed with the patient in reverse Trendelenburg.  +---------+---------------+---------+-----------+----------+-------------------+ RIGHT    CompressibilityPhasicitySpontaneityPropertiesThrombus Aging      +---------+---------------+---------+-----------+----------+-------------------+ CFV      Full           Yes  Yes                                      +---------+---------------+---------+-----------+----------+-------------------+ SFJ      Full                                                             +---------+---------------+---------+-----------+----------+-------------------+ FV Prox  Full                                                             +---------+---------------+---------+-----------+----------+-------------------+ FV Mid   Full                                                              +---------+---------------+---------+-----------+----------+-------------------+ FV DistalFull                                                             +---------+---------------+---------+-----------+----------+-------------------+ PFV      Full                                                             +---------+---------------+---------+-----------+----------+-------------------+ POP      Full           Yes      Yes                                      +---------+---------------+---------+-----------+----------+-------------------+ PTV      Full                                                             +---------+---------------+---------+-----------+----------+-------------------+ PERO     Partial                                      Not well visualized +---------+---------------+---------+-----------+----------+-------------------+   +----+---------------+---------+-----------+----------+--------------+ LEFTCompressibilityPhasicitySpontaneityPropertiesThrombus Aging +----+---------------+---------+-----------+----------+--------------+ CFV Full           Yes      Yes                                 +----+---------------+---------+-----------+----------+--------------+  Summary: RIGHT: - 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 - No cystic structure found in the popliteal fossa.  LEFT: - No evidence of common femoral vein obstruction.  *See table(s) above for measurements and observations. Electronically signed by Waverly Ferrarihristopher Dickson MD on 12/22/2020 at 2:54:55 PM.    Final    VAS US LOWER EXTREMITY VENOUS (DVT)  Result Date: 12/18/2020  Lower Venous DVT Study Patient Name:  Brandon Daniel  Date of Exam:   12/18/2020 Medical Rec #: [id number]      Accession #:    [id number] Date of Birth: 01-Jul-1950     Patient Gender: M Patient Age:   69Y [facility] Procedure:       VAS US LOWER EXTREMITY VENOUS (DVT) Referring Phys: [id number]JEHANZEB MEMON --------------------------------------------------------------------------------  Indications: Stroke. Other Indications: Hypoxia. Comparison Study: No prior Performing Technologist: Marilynne Halstedita Sturdivant RDMS, RVT  Examination Guidelines: A complete evaluation includes B-mode imaging, spectral Doppler, color Doppler, and power Doppler as needed of all accessible portions of each vessel. Bilateral testing is considered an integral part of a complete examination. Limited examinations for reoccurring indications may be performed as noted. The reflux portion of the exam is performed with the patient in reverse Trendelenburg.  +---------+---------------+---------+-----------+----------+-----------------+ RIGHT    CompressibilityPhasicitySpontaneityPropertiesThrombus Aging    +---------+---------------+---------+-----------+----------+-----------------+ CFV      Full           Yes      Yes                                    +---------+---------------+---------+-----------+----------+-----------------+ SFJ      Full                                                           +---------+---------------+---------+-----------+----------+-----------------+ FV Prox  Full                                                           +---------+---------------+---------+-----------+----------+-----------------+ FV Mid   Full                                                           +---------+---------------+---------+-----------+----------+-----------------+ FV DistalFull                                                           +---------+---------------+---------+-----------+----------+-----------------+ PFV      Full                                                           +---------+---------------+---------+-----------+----------+-----------------+  POP      Full           Yes      Yes                                     +---------+---------------+---------+-----------+----------+-----------------+ PTV      Full                                                           +---------+---------------+---------+-----------+----------+-----------------+ PERO     None                                         Age Indeterminate +---------+---------------+---------+-----------+----------+-----------------+   +---------+---------------+---------+-----------+----------+--------------+ LEFT     CompressibilityPhasicitySpontaneityPropertiesThrombus Aging +---------+---------------+---------+-----------+----------+--------------+ CFV      Full           Yes      Yes                                 +---------+---------------+---------+-----------+----------+--------------+ SFJ      Full                                                        +---------+---------------+---------+-----------+----------+--------------+ FV Prox  Full                                                        +---------+---------------+---------+-----------+----------+--------------+ FV Mid   Full                                                        +---------+---------------+---------+-----------+----------+--------------+ FV DistalFull                                                        +---------+---------------+---------+-----------+----------+--------------+ PFV      Full                                                        +---------+---------------+---------+-----------+----------+--------------+ POP      Full           Yes      Yes                                 +---------+---------------+---------+-----------+----------+--------------+  PTV      Full                                                        +---------+---------------+---------+-----------+----------+--------------+ PERO     Full                                                         +---------+---------------+---------+-----------+----------+--------------+     Summary: RIGHT: - Findings consistent with age indeterminate deep vein thrombosis involving the right peroneal veins. - No cystic structure found in the popliteal fossa.  LEFT: - There is no evidence of deep vein thrombosis in the lower extremity.  - No cystic structure found in the popliteal fossa.  *See table(s) above for measurements and observations. Electronically signed by Lemar Livings MD on 12/18/2020 at 2:41:33 PM.    Final        The results of significant diagnostics from this hospitalization (including imaging, microbiology, ancillary and laboratory) are listed below for reference.     Microbiology: Recent Results (from the past 240 hour(s))  SARS CORONAVIRUS 2 (TAT 6-24 HRS) Nasopharyngeal Nasopharyngeal Swab     Status: None   Collection Time: 01/06/21  5:54 PM   Specimen: Nasopharyngeal Swab  Result Value Ref Range Status   SARS Coronavirus 2 NEGATIVE NEGATIVE Final    Comment: (NOTE) SARS-CoV-2 target nucleic acids are NOT DETECTED.  The SARS-CoV-2 RNA is generally detectable in upper and lower respiratory specimens during the acute phase of infection. Negative results do not preclude SARS-CoV-2 infection, do not rule out co-infections with other pathogens, and should not be used as the sole basis for treatment or other patient management decisions. Negative results must be combined with clinical observations, patient history, and epidemiological information. The expected result is Negative.  Fact Sheet for Patients: HairSlick.no  Fact Sheet for Healthcare Providers: quierodirigir.com  This test is not yet approved or cleared by the [place] FDA and  has been authorized for detection and/or diagnosis of SARS-CoV-2 by FDA under an Emergency Use Authorization (EUA). This EUA will remain  in effect (meaning this test can be used) for  the duration of the COVID-19 declaration under Se ction 564(b)(1) of the Act, 21 U.S.C. section 360bbb-3(b)(1), unless the authorization is terminated or revoked sooner.  Performed at [facility], [address], [place], [place] [address]   SARS CORONAVIRUS 2 (TAT 6-24 HRS) Nasopharyngeal Nasopharyngeal Swab     Status: None   Collection Time: 01/11/21  1:11 PM   Specimen: Nasopharyngeal Swab  Result Value Ref Range Status   SARS Coronavirus 2 NEGATIVE NEGATIVE Final    Comment: (NOTE) SARS-CoV-2 target nucleic acids are NOT DETECTED.  The SARS-CoV-2 RNA is generally detectable in upper and lower respiratory specimens during the acute phase of infection. Negative results do not preclude SARS-CoV-2 infection, do not rule out co-infections with other pathogens, and should not be used as the sole basis for treatment or other patient management decisions. Negative results must be combined with clinical observations, patient history, and epidemiological information. The expected result is Negative.  Fact Sheet for Patients: HairSlick.no  Fact Sheet for Healthcare Providers:  quierodirigir.com  This test is not yet approved or cleared by the [place] and  has been authorized for detection and/or diagnosis of SARS-CoV-2 by FDA under an Emergency Use Authorization (EUA). This EUA will remain  in effect (meaning this test can be used) for the duration of the COVID-19 declaration under Se ction 564(b)(1) of the Act, 21 U.S.C. section 360bbb-3(b)(1), unless the authorization is terminated or revoked sooner.  Performed at [facility], [address], [place], [place] [address]      Labs:  CBC: No results for input(s): WBC, NEUTROABS, HGB, HCT, MCV, PLT in the last 168 hours.  BMP &GFR No results for input(s): NA, K, CL, CO2, GLUCOSE, BUN, CREATININE, CALCIUM, MG, PHOS in the last 168 hours.  Invalid  input(s):  GFRCG  Estimated Creatinine Clearance: 101.8 mL/min (A) (by C-G formula based on SCr of 0.53 mg/dL (L)). Liver & Pancreas: No results for input(s): AST, ALT, ALKPHOS, BILITOT, PROT, ALBUMIN in the last 168 hours.  No results for input(s): LIPASE, AMYLASE in the last 168 hours. No results for input(s): AMMONIA in the last 168 hours. Diabetic: No results for input(s): HGBA1C in the last 72 hours. Recent Labs  Lab 01/11/21 1211 01/11/21 1649 01/11/21 2117 01/12/21 0607 01/12/21 1215  GLUCAP 163* 137* 195* 175* 184*   Cardiac Enzymes: No results for input(s): CKTOTAL, CKMB, CKMBINDEX, TROPONINI in the last 168 hours. No results for input(s): PROBNP in the last 8760 hours. Coagulation Profile: No results for input(s): INR, PROTIME in the last 168 hours. Thyroid Function Tests: No results for input(s): TSH, T4TOTAL, FREET4, T3FREE, THYROIDAB in the last 72 hours. Lipid Profile: No results for input(s): CHOL, HDL, LDLCALC, TRIG, CHOLHDL, LDLDIRECT in the last 72 hours. Anemia Panel: No results for input(s): VITAMINB12, FOLATE, FERRITIN, TIBC, IRON, RETICCTPCT in the last 72 hours. Urine analysis:    Component Value Date/Time   COLORURINE YELLOW 12/18/2020 0835   APPEARANCEUR CLEAR 12/18/2020 0835   LABSPEC 1.020 12/18/2020 0835   PHURINE 6.0 12/18/2020 0835   GLUCOSEU NEGATIVE 12/18/2020 0835   HGBUR LARGE (A) 12/18/2020 0835   BILIRUBINUR NEGATIVE 12/18/2020 0835   KETONESUR NEGATIVE 12/18/2020 0835   PROTEINUR 100 (A) 12/18/2020 0835   NITRITE POSITIVE (A) 12/18/2020 0835   LEUKOCYTESUR SMALL (A) 12/18/2020 0835   Sepsis Labs: Invalid input(s): PROCALCITONIN, LACTICIDVEN   Time coordinating discharge: 45 minutes  SIGNED:  Rickey Barbara, MD  [facility] 01/12/2021, 12:33 PM  If 7PM-7AM, please contact night-coverage [email or web address]

## 2021-01-07 NOTE — Plan of Care (Signed)
  Problem: Nutrition: Goal: Risk of aspiration will decrease Outcome: Progressing Goal: Dietary intake will improve Outcome: Progressing   Problem: Ischemic Stroke/TIA Tissue Perfusion: Goal: Complications of ischemic stroke/TIA will be minimized Outcome: Progressing   

## 2021-01-08 DIAGNOSIS — J9601 Acute respiratory failure with hypoxia: Secondary | ICD-10-CM | POA: Diagnosis not present

## 2021-01-08 DIAGNOSIS — I10 Essential (primary) hypertension: Secondary | ICD-10-CM | POA: Diagnosis not present

## 2021-01-08 DIAGNOSIS — I25119 Atherosclerotic heart disease of native coronary artery with unspecified angina pectoris: Secondary | ICD-10-CM | POA: Diagnosis not present

## 2021-01-08 DIAGNOSIS — J101 Influenza due to other identified influenza virus with other respiratory manifestations: Secondary | ICD-10-CM | POA: Diagnosis not present

## 2021-01-08 LAB — GLUCOSE, CAPILLARY
Glucose-Capillary: 127 mg/dL — ABNORMAL HIGH (ref 70–99)
Glucose-Capillary: 148 mg/dL — ABNORMAL HIGH (ref 70–99)
Glucose-Capillary: 169 mg/dL — ABNORMAL HIGH (ref 70–99)
Glucose-Capillary: 133 mg/dL — ABNORMAL HIGH (ref 70–99)

## 2021-01-08 NOTE — Progress Notes (Signed)
Occupational Therapy Treatment Patient Details Name: Brandon Daniel MRN: [id number] DOB: [birth year] Today's Date: 01/08/2021    History of present illness 70 y.o. male admitted 6/9 that  presented with SOB, was found to be Influenza A + and later on day of admission developed mild R facial droop, R arm drift and hand grip weakness along with R leg weakness. Left MCA and ACA CVA. Pt received tPa. VDRF 6/11-6/15. PMH: CAD, HTN, lumbar stenosis, HLD   OT comments  Pt with incremental progress towards established OT goals. Collaborating with PT. Pt soiled on arrival, and performing rolling to R with min A and rolling to L with max A for pericare. Performing pericare with total A. Pt performing x2 sit to stand transfers this session with Max A +2 for power up and maintaining standing balance. Challenging sitting balance and attention to L side in preparation for participation in ADL tasks reaching tasks sitting EOB. Requiring cues to reach to L side. Pt continues to present with decreased strength, sitting/standing balance, problem solving, safety, command following, and awareness. Recommend discharge to SNF for continued OT services and will continue to follow acutely to optimize safety and independence in ADLs.    Follow Up Recommendations  SNF    Equipment Recommendations  Wheelchair cushion (measurements OT);Wheelchair (measurements OT);Hospital bed    Recommendations for Other Services      Precautions / Restrictions Precautions Precautions: Fall Precaution Comments: R hemi Restrictions Weight Bearing Restrictions: No       Mobility Bed Mobility Overal bed mobility: Needs Assistance Bed Mobility: Rolling;Sidelying to Sit;Sit to Sidelying Rolling: Min assist;Max assist Sidelying to sit: Mod assist;+2 for physical assistance     Sit to sidelying: Max assist;+2 for physical assistance General bed mobility comments: minA to roll R and maxA to roll L. ModA+2 to complete sidelying to  sit. MaxA+2 to return to supine    Transfers Overall transfer level: Needs assistance Equipment used: Ambulation equipment used Transfers: Sit to/from Stand Sit to Stand: Max assist;+2 physical assistance         General transfer comment: maxA+2 to stand with Stedy. Support under buttocks and at R UE. Unable to maintain standing without maxA+2. Sit to stand x 2 during session    Balance Overall balance assessment: Needs assistance Sitting-balance support: No upper extremity supported;Single extremity supported;Feet supported Sitting balance-Leahy Scale: Poor Sitting balance - Comments: totalA to maintain sitting balance at EOB and in Stedy with heavy R lateral lean and pushing with L UE. Cues required throughout for placing L hand in lap with intermittent follow through Postural control: Right lateral lean;Posterior lean Standing balance support: Bilateral upper extremity supported;During functional activity Standing balance-Leahy Scale: Zero Standing balance comment: max-totalA+2 to maintain standing <10 seconds                           ADL either performed or assessed with clinical judgement   ADL Overall ADL's : Needs assistance/impaired                         Toilet Transfer: Maximal assistance;+2 for physical assistance;+2 for safety/equipment;Cueing for safety Toilet Transfer Details (indicate cue type and reason): sit to stand with stedy in preparation for toilet transfer. Toileting- Clothing Manipulation and Hygiene: Total assistance;Bed level Toileting - Clothing Manipulation Details (indicate cue type and reason): Pt soiled upon arrival. Requiring total A for posterior pericare at bed level.     Functional  mobility during ADLs: Maximal assistance;+2 for physical assistance;+2 for safety/equipment (Sit<>stand with stedy) General ADL Comments: Pt requiring max A for functional mobility and total A for pericare. Pt requiring cues for sequencing  functional mobility.     Vision   Vision Assessment?: Vision impaired- to be further tested in functional context Additional Comments: Pt scanning during reaching task to find therapist's hand. Increased time.   Perception     Praxis      Cognition Arousal/Alertness: Awake/alert Behavior During Therapy: Flat affect Overall Cognitive Status: Difficult to assess Area of Impairment: Attention;Following commands;Safety/judgement;Awareness;Problem solving                   Current Attention Level: Sustained   Following Commands: Follows one step commands with increased time Safety/Judgement: Decreased awareness of safety;Decreased awareness of deficits Awareness: Intellectual Problem Solving: Decreased initiation;Requires verbal cues;Requires tactile cues General Comments: Pt using gestures to express needs, unable to verbalize. Following commands with increased time and min verbal/visual cues. Decreased probloem solving. Pt leaning right and when cued to lean L, pushing further R with LUE.        Exercises Exercises: Other exercises Other Exercises Other Exercises: Reaching midline and forward with LUE x6 repetitions   Shoulder Instructions       General Comments VSS    Pertinent Vitals/ Pain       Pain Assessment: Faces Faces Pain Scale: No hurt Pain Intervention(s): Monitored during session;Repositioned  Home Living                                          Prior Functioning/Environment              Frequency  Min 2X/week        Progress Toward Goals  OT Goals(current goals can now be found in the care plan section)  Progress towards OT goals: Progressing toward goals  Acute Rehab OT Goals Patient Stated Goal: none stated OT Goal Formulation: Patient unable to participate in goal setting Time For Goal Achievement: 01/08/21 Potential to Achieve Goals: Good ADL Goals Pt Will Perform Grooming: with min assist;sitting Pt Will  Transfer to Toilet: with max assist;squat pivot transfer;bedside commode Additional ADL Goal #1: Pt will maintain EOB sitting with minA while performing BADL task x10 mins Additional ADL Goal #2: Pt will locate ADL items on R side with minimal cues Additional ADL Goal #3: Pt will sustain attention to simple ADL task x5 mins with min cues.  Plan Discharge plan remains appropriate    Co-evaluation    PT/OT/SLP Co-Evaluation/Treatment: Yes Reason for Co-Treatment: Complexity of the patient's impairments (multi-system involvement);Necessary to address cognition/behavior during functional activity;For patient/therapist safety;To address functional/ADL transfers PT goals addressed during session: Mobility/safety with mobility;Balance OT goals addressed during session: ADL's and self-care      AM-PAC OT "6 Clicks" Daily Activity     Outcome Measure   Help from another person eating meals?: Total Help from another person taking care of personal grooming?: A Lot Help from another person toileting, which includes using toliet, bedpan, or urinal?: Total Help from another person bathing (including washing, rinsing, drying)?: A Lot Help from another person to put on and taking off regular upper body clothing?: A Lot Help from another person to put on and taking off regular lower body clothing?: Total 6 Click Score: 9    End of Session Equipment Utilized  During Treatment: Gait belt  OT Visit Diagnosis: Unsteadiness on feet (R26.81);Hemiplegia and hemiparesis;Cognitive communication deficit (R41.841) Symptoms and signs involving cognitive functions: Cerebral infarction Hemiplegia - Right/Left: Right Hemiplegia - dominant/non-dominant: Dominant Hemiplegia - caused by: Cerebral infarction   Activity Tolerance Patient tolerated treatment well   Patient Left in bed;with call bell/phone within reach;with bed alarm set   Nurse Communication Mobility status        Time: 4008-6761 OT Time  Calculation (min): 38 min  Charges: OT General Charges $OT Visit: 1 Visit OT Treatments $Therapeutic Activity: 8-22 mins  Ladene Artist, OTDS    Ladene Artist 01/08/2021, 3:36 PM

## 2021-01-08 NOTE — Progress Notes (Signed)
PROGRESS NOTE  Brandon Daniel NLG:[id number] DOB: [birth year]   PCP: Pcp, No  Patient is from: Home  DOA: 12/03/2020 LOS: 36  Chief complaints:  Chief Complaint  Patient presents with   Shortness of Breath     Brief Narrative / Interim history: 70 year old M with PMH of CAD, HTN and HLD presenting with shortness of breath and admitted for influenza A infection and elevated troponin.  The next day, noted to have acute change in neuro status with acute right-sided weakness.  Work-up indicated left MCA and ACA territory infarct.  Received tPA.  On 6/10, developed respiratory failure requiring intubation, and found to have bilateral lower lobe pneumonia.  Treated with antibiotics and diuretics.  Subsequently extubated on 6/17 and transferred to [facility] service on 6/22.  Has had recurrence of fever and found to have Klebsiella ESBL UTI and staph pneumonia.  Started on meropenem.  Also found to have RLE DVT for which he was started on DOAC.  Has dysphagia and started on TF via cortrak.  However, cortrak fell out on 7/2.  Upgraded to dysphagia 1 diet by SLP.  Seems to have good oral intake with assistance.  Therapy recommended CIR but CIR suggesting SNF.    Patient is medically stable for discharge pending insurance authorization.  Subjective: Seen and examined earlier this morning.  No major events overnight of this morning.  No complaints.  He notes no to pain.   Objective: Vitals:   01/08/21 0430 01/08/21 0747 01/08/21 0758 01/08/21 1125  BP: 134/61  (!) 144/81 (!) 157/95  Pulse: 79  85 94  Resp: 18  18 20   Temp: 98.4 F (36.9 C)  98.7 F (37.1 C) 98.2 F (36.8 C)  TempSrc: Oral  Oral Oral  SpO2: 94% 94% 94% 96%  Weight: 94.4 kg     Height:        Intake/Output Summary (Last 24 hours) at 01/08/2021 1408 Last data filed at 01/07/2021 2055 Gross per 24 hour  Intake 120 ml  Output 450 ml  Net -330 ml   Filed Weights   01/06/21 0635 01/07/21 0635 01/08/21 0430  Weight: 97.8 kg 97.7 kg  94.4 kg    Examination:  GENERAL: No apparent distress.  Nontoxic. HEENT: MMM.  Vision and hearing grossly intact.  NECK: Supple.  No apparent JVD.  RESP:  No IWOB.  Fair aeration bilaterally. CVS:  RRR. Heart sounds normal.  ABD/GI/GU: BS+. Abd soft, NTND.  MSK/EXT: Moves left extremities.  Right hemiparesis.  Trace edema bilaterally. SKIN: Stage II pressure skin injury as below. NEURO: Awake and alert.  Follows commands.  Expressive aphasia.  Right facial droop.  Right hemiparesis. PSYCH: Calm.  Bright affect.  Procedures:  Intubation and extubation  Microbiology summarized: 6/9-influenza A PCR positive. 6/9-COVID-19 PCR and MRSA PCR negative. 6/24-urine culture with ESBL Klebsiella pneumonia 6/23-respiratory culture with staph aureus 6/24-blood cultures NGTD   Assessment & Plan: Left MCA/ACA territory stroke-dysphagia, aphasia, right facial droop and right hemiparesis. 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  LDL 55.  A1c 6.2%. -On Eliquis and aspirin.  Neurology recommendede DAPT once he completes Eliquis course for DVT -Outpatient follow-up with neurology -Neuro interventional radiology recommended outpatient follow-up for ICA stenting -Advanced to dysphagia 2 diet on 7/14. -Started Prozac 20 mg daily on 7/12   Non-STEMI: Noted to have new ST depressions in V6 and worsening T wave inversions in inferior leads.  Seen by cardiology and  underwent left heart cath on 6/14 that showed multivessel disease. -Cardiology recs-medical management and outpatient follow-up   Acute respiratory failure with hypoxia-initially due to influenza A pneumonia followed by staph pneumonia and aspiration in the setting of dysphagia.  Completed 5 days of IV Unasyn on 7/8.  Resolved. 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  Advanced to  dysphagia 2 diet on 7/14. -P.o. intake improved.  Maintaining adequate caloric intake orally.   ESBL Klebsiella UTI -Completed course with 2 days of cefepime, 3 days of meropenem and fosfomycin on 6/29    RLE peroneal DVT-age-indeterminate.   -Decision is made to treat given high risk for extension due to immobility   Prediabetes: A1c 6.2%.  Does not seem to be on medication at home. Recent Labs  Lab 01/07/21 1142 01/07/21 1641 01/07/21 2144 01/08/21 0615 01/08/21 1123  GLUCAP 192* 157* 108* 127* 148*  -Continue current insulin regimen and a statin. -Adjust insulin as appropriate  Urinary retention, recurrent: Failed multiple voiding trials.  Foley catheter placed back again on 7/2.   -Continue Flomax -Outpatient follow-up with urology for voiding trial   Hypertension/NSVT: TTE with LVEF 55-60%.  Check TSH: 0.833. Normotensive. -Continue metoprolol -Optimize electrolytes  AKI: Resolved  Goals of care -Seen by palliative.  Full scope of care.   Diarrhea: Resolved  Abnormal LFTs: Improved -Check LFT periodically   Hypokalemia: Resolved  Nutrition: See dysphagia above Body mass index is 30.72 kg/m. Nutrition Problem: Inadequate oral intake Etiology: inability to eat Signs/Symptoms: NPO status Interventions: Tube feeding, Prostat  Pressure skin injury: POA Pressure Injury 12/23/20 Nare Left;Lower Stage 2 -  Partial thickness loss of dermis presenting as a shallow open injury with a red, pink wound bed without slough. Injury to the left nare where cortrak tie made contact with the skin. (Active)  12/23/20 1800  Location: Nare  Location Orientation: Left;Lower  Staging: Stage 2 -  Partial thickness loss of dermis presenting as a shallow open injury with a red, pink wound bed without slough.  Wound Description (Comments): Injury to the left nare where cortrak tie made contact with the skin.  Present on Admission: No   DVT prophylaxis:   apixaban (ELIQUIS) tablet 5  mg   Code Status: Full code Family Communication: Patient and/or RN.  None at bedside today Level of care: Med-Surg Status is: Inpatient  Remains inpatient appropriate because:Unsafe d/c plan  Dispo: The patient is from: Home              Anticipated d/c is to: SNF pending insurance authorization              Patient currently is medically stable to d/c.   Difficult to place patient No       Consultants:  Neurology-signed off Cardiology-signed off   Sch Meds:  Scheduled Meds:  apixaban  5 mg Oral BID   arformoterol  15 mcg Nebulization BID   aspirin  81 mg Oral Daily   atorvastatin  80 mg Oral Daily   budesonide (PULMICORT) nebulizer solution  0.5 mg Nebulization BID   chlorhexidine  15 mL Mouth Rinse BID   Chlorhexidine Gluconate Cloth  6 each Topical Daily   docusate sodium  100 mg Oral Daily   ezetimibe  10 mg Oral Daily   ferrous sulfate  325 mg Oral BID WC   FLUoxetine  20 mg Oral Daily   gabapentin  300 mg Oral  BID   insulin aspart  0-5 Units Subcutaneous QHS   insulin aspart  0-9 Units Subcutaneous TID WC   insulin glargine  5 Units Subcutaneous BID   mouth rinse  15 mL Mouth Rinse q12n4p   metoprolol tartrate  25 mg Oral BID   montelukast  10 mg Oral QHS   multivitamin with minerals  1 tablet Oral Daily   pantoprazole  40 mg Oral QHS   QUEtiapine  12.5 mg Oral QHS   sodium chloride flush  3 mL Intravenous Q12H   tamsulosin  0.4 mg Oral QPC supper   Continuous Infusions:  sodium chloride     sodium chloride 50 mL/hr at 01/08/21 0154   PRN Meds:.sodium chloride, acetaminophen, albuterol, food thickener, guaiFENesin-dextromethorphan, nitroGLYCERIN, ondansetron (ZOFRAN) IV, polyethylene glycol, senna-docusate, sodium chloride flush  Antimicrobials: Anti-infectives (From admission, onward)    Start     Dose/Rate Route Frequency Ordered Stop   12/27/20 2300  Ampicillin-Sulbactam (UNASYN) 3 g in sodium chloride 0.9 % 100 mL IVPB  Status:  Discontinued         3 g 200 mL/hr over 30 Minutes Intravenous Every 6 hours 12/27/20 2121 01/01/21 1207   12/23/20 1500  fosfomycin (MONUROL) packet 3 g        3 g Oral  Once 12/23/20 1314 12/23/20 1755   12/20/20 1400  meropenem (MERREM) 1 g in sodium chloride 0.9 % 100 mL IVPB  Status:  Discontinued        1 g 200 mL/hr over 30 Minutes Intravenous Every 8 hours 12/20/20 1123 12/23/20 1314   12/18/20 1100  vancomycin (VANCOREADY) IVPB 1750 mg/350 mL  Status:  Discontinued        1,750 mg 175 mL/hr over 120 Minutes Intravenous Every 24 hours 12/18/20 1002 12/20/20 1626   12/18/20 1045  ceFEPIme (MAXIPIME) 2 g in sodium chloride 0.9 % 100 mL IVPB  Status:  Discontinued        2 g 200 mL/hr over 30 Minutes Intravenous Every 8 hours 12/18/20 0958 12/20/20 1051   12/09/20 0000  cefTRIAXone (ROCEPHIN) 2 g in sodium chloride 0.9 % 100 mL IVPB        2 g 200 mL/hr over 30 Minutes Intravenous Every 24 hours 12/08/20 1044 12/10/20 0003   12/06/20 0000  cefTRIAXone (ROCEPHIN) 2 g in sodium chloride 0.9 % 100 mL IVPB  Status:  Discontinued        2 g 200 mL/hr over 30 Minutes Intravenous Every 24 hours 12/05/20 0033 12/08/20 1044   12/05/20 1000  oseltamivir (TAMIFLU) 6 MG/ML suspension 75 mg        75 mg Per Tube 2 times daily 12/05/20 0755 12/09/20 2157   12/05/20 0200  azithromycin (ZITHROMAX) 500 mg in sodium chloride 0.9 % 250 mL IVPB  Status:  Discontinued        500 mg 250 mL/hr over 60 Minutes Intravenous Every 24 hours 12/05/20 0033 12/08/20 1004   12/05/20 0130  cefTRIAXone (ROCEPHIN) 2 g in sodium chloride 0.9 % 100 mL IVPB        2 g 200 mL/hr over 30 Minutes Intravenous  Once 12/05/20 0035 12/05/20 0145   12/03/20 1030  oseltamivir (TAMIFLU) capsule 75 mg  Status:  Discontinued        75 mg Oral 2 times daily 12/03/20 0929 12/05/20 0755        I have personally reviewed the following labs and images: CBC: Recent Labs  Lab 01/02/21 0109 01/04/21 0419  WBC 3.3* 3.4*  HGB 9.7* 11.5*  HCT  31.6* 36.7*  MCV 90.3 88.6  PLT 193 162   BMP &GFR Recent Labs  Lab 01/02/21 0109 01/04/21 0419  NA  --  137  K  --  3.7  CL  --  101  CO2  --  30  GLUCOSE  --  135*  BUN  --  12  CREATININE  --  0.53*  CALCIUM  --  8.5*  MG 1.9 2.1  PHOS  --  4.1   Estimated Creatinine Clearance: 101.6 mL/min (A) (by C-G formula based on SCr of 0.53 mg/dL (L)). Liver & Pancreas: Recent Labs  Lab 01/02/21 0109 01/04/21 0419  AST 42* 35  ALT 56* 49*  ALKPHOS 65 72  BILITOT 0.7 0.8  PROT 5.5* 6.0*  ALBUMIN 2.1* 2.4*   No results for input(s): LIPASE, AMYLASE in the last 168 hours. No results for input(s): AMMONIA in the last 168 hours. Diabetic: No results for input(s): HGBA1C in the last 72 hours. Recent Labs  Lab 01/07/21 1142 01/07/21 1641 01/07/21 2144 01/08/21 0615 01/08/21 1123  GLUCAP 192* 157* 108* 127* 148*   Cardiac Enzymes: No results for input(s): CKTOTAL, CKMB, CKMBINDEX, TROPONINI in the last 168 hours. No results for input(s): PROBNP in the last 8760 hours. Coagulation Profile: No results for input(s): INR, PROTIME in the last 168 hours. Thyroid Function Tests: No results for input(s): TSH, T4TOTAL, FREET4, T3FREE, THYROIDAB in the last 72 hours. Lipid Profile: No results for input(s): CHOL, HDL, LDLCALC, TRIG, CHOLHDL, LDLDIRECT in the last 72 hours. Anemia Panel: No results for input(s): VITAMINB12, FOLATE, FERRITIN, TIBC, IRON, RETICCTPCT in the last 72 hours.  Urine analysis:    Component Value Date/Time   COLORURINE YELLOW 12/18/2020 0835   APPEARANCEUR CLEAR 12/18/2020 0835   LABSPEC 1.020 12/18/2020 0835   PHURINE 6.0 12/18/2020 0835   GLUCOSEU NEGATIVE 12/18/2020 0835   HGBUR LARGE (A) 12/18/2020 0835   BILIRUBINUR NEGATIVE 12/18/2020 0835   KETONESUR NEGATIVE 12/18/2020 0835   PROTEINUR 100 (A) 12/18/2020 0835   NITRITE POSITIVE (A) 12/18/2020 0835   LEUKOCYTESUR SMALL (A) 12/18/2020 0835   Sepsis Labs: Invalid input(s): PROCALCITONIN,  LACTICIDVEN  Microbiology: Recent Results (from the past 240 hour(s))  SARS CORONAVIRUS 2 (TAT 6-24 HRS) Nasopharyngeal Nasopharyngeal Swab     Status: None   Collection Time: 01/06/21  5:54 PM   Specimen: Nasopharyngeal Swab  Result Value Ref Range Status   SARS Coronavirus 2 NEGATIVE NEGATIVE Final    Comment: (NOTE) SARS-CoV-2 target nucleic acids are NOT DETECTED.  The SARS-CoV-2 RNA is generally detectable in upper and lower respiratory specimens during the acute phase of infection. Negative results do not preclude SARS-CoV-2 infection, do not rule out co-infections with other pathogens, and should not be used as the sole basis for treatment or other patient management decisions. Negative results must be combined with clinical observations, patient history, and epidemiological information. The expected result is Negative.  Fact Sheet for Patients: HairSlick.nohttps://www.fda.gov/media/138098/download  Fact Sheet for Healthcare Providers: quierodirigir.comhttps://www.fda.gov/media/138095/download  This test is not yet approved or cleared by the [place] FDA and  has been authorized for detection and/or diagnosis of SARS-CoV-2 by FDA under an Emergency Use Authorization (EUA). This EUA will remain  in effect (meaning this test can be used) for the duration of the COVID-19 declaration under Se ction 564(b)(1) of the Act, 21 U.S.C. section 360bbb-3(b)(1), unless the authorization is terminated or revoked sooner.  Performed at [facility]   [facility], [address] [place], [place] [address]     Radiology Studies: No results found.    Ayo Guarino T. Diani Jillson [facility]  If 7PM-7AM, please contact night-coverage [email or web address] 01/08/2021, 2:08 PM

## 2021-01-08 NOTE — Progress Notes (Signed)
Physical Therapy Treatment Patient Details Name: Brandon Daniel MRN: [id number] DOB: 11/22/1950 Today's Date: 01/08/2021    History of Present Illness [name] is a 70 y.o. male admitted 6/9 that  presented with SOB, was found to be Influenza A + and later on day of admission developed mild R facial droop, R arm drift and hand grip weakness along with R leg weakness. Left MCA and ACA CVA. Pt received tPa. VDRF 6/11-6/15. PMH: CAD, HTN, lumbar stenosis, HLD    PT Comments    Patient seen in conjunction with OT to maximize patient tolerance to activity. On arrival, patient with BM requiring totalA to pericare with min-maxA to roll R<>L. Patient required max-totalA to maintain sitting EOB with heavy R lateral lean, cues for placing L UE in lap with intermittent follow through. Able to stand x2 with Stedy with maxA+2 but heavy R lateral lean due to pushing L UE on Stedy bar. Easily fatigued and reports dizziness while sitting in [place]. No verbalizations attempted this session but gesturing for needs. Continue to recommend SNF for ongoing Physical Therapy.       Follow Up Recommendations  SNF;Supervision/Assistance - 24 hour     Equipment Recommendations  Other (comment) (defer to post acute rehab)    Recommendations for Other Services       Precautions / Restrictions Precautions Precautions: Fall Precaution Comments: R hemi Restrictions Weight Bearing Restrictions: No    Mobility  Bed Mobility Overal bed mobility: Needs Assistance Bed Mobility: Rolling;Sidelying to Sit;Sit to Sidelying Rolling: Min assist;Max assist Sidelying to sit: Mod assist;+2 for physical assistance     Sit to sidelying: Max assist;+2 for physical assistance General bed mobility comments: minA to roll R ad maxA to roll L. ModA+2 to complete sidelying to sit. MaxA+2 to return to supine    Transfers Overall transfer level: Needs assistance Equipment used: Ambulation equipment used Transfers: Sit  to/from Stand Sit to Stand: Max assist;+2 physical assistance         General transfer comment: maxA+2 to stand with Stedy. Support under buttocks and at R UE. Unable to maintain standing without maxA+2. Sit to stand x 2 during session  Ambulation/Gait             General Gait Details: unable   Stairs             Wheelchair Mobility    Modified Rankin (Stroke Patients Only) Modified Rankin (Stroke Patients Only) Pre-Morbid Rankin Score: No symptoms Modified Rankin: Severe disability     Balance Overall balance assessment: Needs assistance Sitting-balance support: No upper extremity supported;Single extremity supported;Feet supported Sitting balance-Leahy Scale: Poor Sitting balance - Comments: totalA to maintain sitting balance at EOB and in Stedy with heavy R lateral lean and pushing with L UE. Cues required throughout for placing L hand in lap with intermittent follow through Postural control: Right lateral lean;Posterior lean Standing balance support: Bilateral upper extremity supported;During functional activity Standing balance-Leahy Scale: Zero Standing balance comment: max-totalA+2 to maintain standing <10 seconds                            Cognition Arousal/Alertness: Awake/alert Behavior During Therapy: Flat affect Overall Cognitive Status: Difficult to assess Area of Impairment: Attention;Following commands;Safety/judgement;Awareness;Problem solving                   Current Attention Level: Sustained   Following Commands: Follows one step commands with increased time Safety/Judgement: Decreased awareness  of safety;Decreased awareness of deficits Awareness: Intellectual Problem Solving: Decreased initiation;Requires verbal cues;Requires tactile cues General Comments: not willing to verbalize this session. Gesturing for needs. Follows simple commands with increased time and at times with gestural cueing      Exercises       General Comments        Pertinent Vitals/Pain Pain Assessment: Faces Faces Pain Scale: No hurt Pain Intervention(s): Monitored during session    Home Living                      Prior Function            PT Goals (current goals can now be found in the care plan section) Acute Rehab PT Goals Patient Stated Goal: none stated PT Goal Formulation: With family Time For Goal Achievement: 01/22/21 Potential to Achieve Goals: Fair Progress towards PT goals: Progressing toward goals    Frequency    Min 3X/week      PT Plan Current plan remains appropriate    Co-evaluation PT/OT/SLP Co-Evaluation/Treatment: Yes Reason for Co-Treatment: Complexity of the patient's impairments (multi-system involvement);For patient/therapist safety;To address functional/ADL transfers PT goals addressed during session: Mobility/safety with mobility;Balance        AM-PAC PT "6 Clicks" Mobility   Outcome Measure  Help needed turning from your back to your side while in a flat bed without using bedrails?: A Lot Help needed moving from lying on your back to sitting on the side of a flat bed without using bedrails?: Total Help needed moving to and from a bed to a chair (including a wheelchair)?: Total Help needed standing up from a chair using your arms (e.g., wheelchair or bedside chair)?: Total Help needed to walk in hospital room?: Total Help needed climbing 3-5 steps with a railing? : Total 6 Click Score: 7    End of Session Equipment Utilized During Treatment: Gait belt Activity Tolerance: Patient limited by fatigue Patient left: in bed;with call bell/phone within reach;with bed alarm set Nurse Communication: Mobility status PT Visit Diagnosis: Muscle weakness (generalized) (M62.81);Hemiplegia and hemiparesis Hemiplegia - Right/Left: Right Hemiplegia - dominant/non-dominant: Dominant Hemiplegia - caused by: Cerebral infarction     Time: 6468-0321 PT Time Calculation (min)  (ACUTE ONLY): 38 min  Charges:  $Therapeutic Activity: 23-37 mins                     Brandon Daniel A. Brandon Daniel PT, DPT Acute Rehabilitation Services Pager [phone] Office [phone]    Brandon Daniel 01/08/2021, 3:24 PM

## 2021-01-08 NOTE — TOC Progression Note (Signed)
Transition of Care (TOC) - Progression Note    Patient Details  Name: Brandon Daniel MRN: [id number] Date of Birth: 08/07/1950  Transition of Care (TOC) CM/SW Contact   M , LCSW Phone Number: 01/08/2021, 4:16 PM  Clinical Narrative:   CSW met with patient and siblings at bedside to discuss SNF placement. Patient's family adamantly refusing [facility]. CSW discussed other options, and brother at bedside said that the family had reached out to [facility] about getting them to agree to work with the insurance or if they needed to private pay and what that would look like. CSW spoke with patient's fiance, Brandon Daniel ([phone]), to discuss that she had left a message with [facility] to try to get him in there. Family also asking about [facility]. CSW contacted [facility] to ask about referral, and they asked it to be sent again for review. CSW sent referral, and then left a message asking for update. No response from [facility] at this time. CSW spoke with Brandon Daniel again and she said that she spoke with Tracy at [facility] who was supposed to call CSW, but CSW has not received a call. CSW contacted [facility] Admission, and they are still unable to offer a bed for the patient. Still awaiting response from [facility]. CSW to follow.     Expected Discharge Plan: Skilled Nursing Facility Barriers to Discharge: Insurance Authorization  Expected Discharge Plan and Services Expected Discharge Plan: Skilled Nursing Facility In-house Referral: Clinical Social Work   Post Acute Care Choice: Long Term Acute Care (LTAC) Living arrangements for the past 2 months: Single Family Home Expected Discharge Date: 01/07/21                                     Social Determinants of Health (SDOH) Interventions    Readmission Risk Interventions No flowsheet data found.  

## 2021-01-08 NOTE — Plan of Care (Signed)
  Problem: Clinical Measurements: °Goal: Ability to maintain clinical measurements within normal limits will improve °Outcome: Progressing °Goal: Will remain free from infection °Outcome: Progressing °Goal: Diagnostic test results will improve °Outcome: Progressing °Goal: Respiratory complications will improve °Outcome: Progressing °Goal: Cardiovascular complication will be avoided °Outcome: Progressing °  °Problem: Pain Managment: °Goal: General experience of comfort will improve °Outcome: Progressing °  °Problem: Ischemic Stroke/TIA Tissue Perfusion: °Goal: Complications of ischemic stroke/TIA will be minimized °Outcome: Progressing °  °

## 2021-01-08 NOTE — TOC Progression Note (Signed)
Transition of Care [facility]) - Progression Note    Patient Details  Name: Brandon Daniel MRN: [id number] Date of Birth: 09/26/1950  Transition of Care [facility]) CM/SW [place],  Phone Number: 01/08/2021, 9:19 AM  Clinical Narrative:   CSW following for discharge plan. CSW checked in with Admissions at [facility] and per Etheleen Nicks is still pending. CSW received call from patient's insurance case manager and provided direct number to [place] to call about auth. CSW then met with patient's siblings at bedside who did not know the latest update, and are not happy with choice of [facility]. CSW explained to them that previous preferences had declined. Patient's siblings asked for update on bed offers and CSW provided updated list to them. Siblings are reviewing. Authorization still not obtained for patient to transition to SNF. CSW to follow.    Expected Discharge Plan: Skilled Nursing Facility Barriers to Discharge: Insurance Authorization  Expected Discharge Plan and Services Expected Discharge Plan: [place] In-house Referral: Clinical Social Work   Post Acute Care Choice: Long Term Acute Care (LTAC) Living arrangements for the past 2 months: Single Family Home Expected Discharge Date: 01/07/21                                     Social Determinants of Health (SDOH) Interventions    Readmission Risk Interventions No flowsheet data found.

## 2021-01-08 NOTE — Progress Notes (Signed)
OTR reviewed charges and placed for treatment completed.    01/08/2021  Rich, OTR/L  Acute Rehabilitation Services  Office:  [phone]

## 2021-01-09 DIAGNOSIS — J9601 Acute respiratory failure with hypoxia: Secondary | ICD-10-CM | POA: Diagnosis not present

## 2021-01-09 DIAGNOSIS — J101 Influenza due to other identified influenza virus with other respiratory manifestations: Secondary | ICD-10-CM | POA: Diagnosis not present

## 2021-01-09 DIAGNOSIS — I63232 Cerebral infarction due to unspecified occlusion or stenosis of left carotid arteries: Secondary | ICD-10-CM | POA: Diagnosis not present

## 2021-01-09 LAB — GLUCOSE, CAPILLARY
Glucose-Capillary: 138 mg/dL — ABNORMAL HIGH (ref 70–99)
Glucose-Capillary: 156 mg/dL — ABNORMAL HIGH (ref 70–99)
Glucose-Capillary: 95 mg/dL (ref 70–99)
Glucose-Capillary: 166 mg/dL — ABNORMAL HIGH (ref 70–99)

## 2021-01-09 NOTE — Plan of Care (Signed)
  Problem: Clinical Measurements: Goal: Will remain free from infection Outcome: Progressing Goal: Diagnostic test results will improve Outcome: Progressing Goal: Respiratory complications will improve Outcome: Progressing Goal: Cardiovascular complication will be avoided Outcome: Progressing   Problem: Clinical Measurements: Goal: Will remain free from infection Outcome: Progressing Goal: Diagnostic test results will improve Outcome: Progressing Goal: Respiratory complications will improve Outcome: Progressing Goal: Cardiovascular complication will be avoided Outcome: Progressing

## 2021-01-09 NOTE — Progress Notes (Signed)
PROGRESS NOTE    Brandon Daniel  IRJ:[id number] DOB: [birth year] DOA: 12/03/2020 PCP: Pcp, No    Brief Narrative:  70 year old M with PMH of CAD, HTN and HLD presenting with shortness of breath and admitted for influenza A infection and elevated troponin.  The next day, noted to have acute change in neuro status with acute right-sided weakness.  Work-up indicated left MCA and ACA territory infarct.  Received tPA.  On 6/10, developed respiratory failure requiring intubation, and found to have bilateral lower lobe pneumonia.  Treated with antibiotics and diuretics.  Subsequently extubated on 6/17 and transferred to [facility] service on 6/22.  Has had recurrence of fever and found to have Klebsiella ESBL UTI and staph pneumonia.  Started on meropenem.  Also found to have RLE DVT for which he was started on DOAC.  Has dysphagia and started on TF via cortrak.  However, cortrak fell out on 7/2.  Upgraded to dysphagia 1 diet by SLP.  Seems to have good oral intake with assistance.  Therapy recommended CIR but CIR suggesting SNF.  Assessment & Plan:   Principal Problem:   Influenza A Active Problems:   NSTEMI (non-ST elevated myocardial infarction) (HCC)   CAD (coronary artery disease)   Asthma   Essential hypertension   Hyperlipidemia   Dyspnea   Pleural effusion on left   Stroke (cerebrum) (HCC)   Acute respiratory failure with hypoxia (HCC)   Pressure injury of skin  Left MCA/ACA territory stroke-dysphagia, aphasia, right facial droop and right hemiparesis. 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  LDL 55.  A1c 6.2%. -On Eliquis and aspirin.  Neurology recommendede DAPT once he completes Eliquis course for DVT -Recommend outpatient follow-up with neurology -Neuro interventional radiology recommended outpatient follow-up for ICA stenting -Advanced to dysphagia 2 diet on 7/14. -On Prozac 20 mg daily on 7/12   Non-STEMI: Noted to have new  ST depressions in V6 and worsening T wave inversions in inferior leads.  Seen by cardiology and underwent left heart cath on 6/14 that showed multivessel disease. -Cardiology consulted   Acute respiratory failure with hypoxia-initially due to influenza A pneumonia followed by staph pneumonia and aspiration in the setting of dysphagia.  Completed 5 days of IV Unasyn on 7/8.  Resolved. 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  Advanced to dysphagia 2 diet on 7/14. -P.o. intake improved.  Maintaining adequate caloric intake orally.   ESBL Klebsiella UTI -Completed course with 2 days of cefepime, 3 days of meropenem and fosfomycin on 6/29    RLE peroneal DVT-age-indeterminate.   -Decision is made to treat given high risk for extension due to immobility   Prediabetes: A1c 6.2%.  Does not seem to be on medication at home.  -Continue current insulin regimen and a statin. -Adjust insulin as appropriate   Urinary retention, recurrent: Failed multiple voiding trials.  Foley catheter placed back again on 7/2.   -Continue Flomax -Outpatient follow-up with urology for voiding trial   Hypertension/NSVT: TTE with LVEF 55-60%.  Check TSH: 0.833. Normotensive. -Continue metoprolol -Optimize electrolytes   AKI: Resolved   Goals of care -Seen by palliative.  Full scope of care.   Diarrhea: Resolved   Abnormal LFTs: Improved -Check LFT periodically   Hypokalemia: Resolved   Nutrition: See dysphagia above Body mass index is 30.72 kg/m. Nutrition Problem: Inadequate oral intake Etiology: inability  to eat Signs/Symptoms: NPO status Interventions: Tube feeding, Prostat   Pressure skin injury: POA Pressure Injury 12/23/20 Nare Left;Lower Stage 2 -  Partial thickness loss of dermis presenting as a shallow open injury with a red, pink wound bed without slough. Injury to the left nare where cortrak tie  made contact with the skin. (Active)  12/23/20 1800  Location: Nare  Location Orientation: Left;Lower  Staging: Stage 2 -  Partial thickness loss of dermis presenting as a shallow open injury with a red, pink wound bed without slough.  Wound Description (Comments): Injury to the left nare where cortrak tie made contact with the skin.  Present on Admission: No    DVT prophylaxis: Eliquis Code Status: Full Family Communication: Pt in room, family not at bedside  Status is: Inpatient  Remains inpatient appropriate because:Inpatient level of care appropriate due to severity of illness  Dispo: The patient is from: Home              Anticipated d/c is to: SNF              Patient currently is not medically stable to d/c.   Difficult to place patient No       Consultants:  Neurology Cardiology  Procedures:    Antimicrobials: Anti-infectives (From admission, onward)    Start     Dose/Rate Route Frequency Ordered Stop   12/27/20 2300  Ampicillin-Sulbactam (UNASYN) 3 g in sodium chloride 0.9 % 100 mL IVPB  Status:  Discontinued        3 g 200 mL/hr over 30 Minutes Intravenous Every 6 hours 12/27/20 2121 01/01/21 1207   12/23/20 1500  fosfomycin (MONUROL) packet 3 g        3 g Oral  Once 12/23/20 1314 12/23/20 1755   12/20/20 1400  meropenem (MERREM) 1 g in sodium chloride 0.9 % 100 mL IVPB  Status:  Discontinued        1 g 200 mL/hr over 30 Minutes Intravenous Every 8 hours 12/20/20 1123 12/23/20 1314   12/18/20 1100  vancomycin (VANCOREADY) IVPB 1750 mg/350 mL  Status:  Discontinued        1,750 mg 175 mL/hr over 120 Minutes Intravenous Every 24 hours 12/18/20 1002 12/20/20 1626   12/18/20 1045  ceFEPIme (MAXIPIME) 2 g in sodium chloride 0.9 % 100 mL IVPB  Status:  Discontinued        2 g 200 mL/hr over 30 Minutes Intravenous Every 8 hours 12/18/20 0958 12/20/20 1051   12/09/20 0000  cefTRIAXone (ROCEPHIN) 2 g in sodium chloride 0.9 % 100 mL IVPB        2 g 200 mL/hr over  30 Minutes Intravenous Every 24 hours 12/08/20 1044 12/10/20 0003   12/06/20 0000  cefTRIAXone (ROCEPHIN) 2 g in sodium chloride 0.9 % 100 mL IVPB  Status:  Discontinued        2 g 200 mL/hr over 30 Minutes Intravenous Every 24 hours 12/05/20 0033 12/08/20 1044   12/05/20 1000  oseltamivir (TAMIFLU) 6 MG/ML suspension 75 mg        75 mg Per Tube 2 times daily 12/05/20 0755 12/09/20 2157   12/05/20 0200  azithromycin (ZITHROMAX) 500 mg in sodium chloride 0.9 % 250 mL IVPB  Status:  Discontinued        500 mg 250 mL/hr over 60 Minutes Intravenous Every 24 hours 12/05/20 0033 12/08/20 1004   12/05/20 0130  cefTRIAXone (ROCEPHIN) 2 g in sodium chloride 0.9 % 100  mL IVPB        2 g 200 mL/hr over 30 Minutes Intravenous  Once 12/05/20 0035 12/05/20 0145   12/03/20 1030  oseltamivir (TAMIFLU) capsule 75 mg  Status:  Discontinued        75 mg Oral 2 times daily 12/03/20 0929 12/05/20 0755       Subjective: Without complaints  Objective: Vitals:   01/09/21 0749 01/09/21 0752 01/09/21 0756 01/09/21 1148  BP:   (!) 143/78 134/78  Pulse:   86 86  Resp:   18   Temp:   98.6 F (37 C) 98.5 F (36.9 C)  TempSrc:      SpO2: 96% 96% 95% 95%  Weight:      Height:        Intake/Output Summary (Last 24 hours) at 01/09/2021 1456 Last data filed at 01/09/2021 7619 Gross per 24 hour  Intake --  Output 800 ml  Net -800 ml   Filed Weights   01/07/21 0635 01/08/21 0430 01/09/21 0500  Weight: 97.7 kg 94.4 kg 94.2 kg    Examination: General exam: Awake, laying in bed, in nad Respiratory system: Normal respiratory effort, no wheezing Cardiovascular system: regular rate, s1, s2 Gastrointestinal system: Soft, nondistended, positive BS Central nervous system: CN2-12 grossly intact, strength intact Extremities: Perfused, no clubbing Skin: Normal skin turgor, no notable skin lesions seen Psychiatry: Mood normal // no visual hallucinations   Data Reviewed: I have personally reviewed following  labs and imaging studies  CBC: Recent Labs  Lab 01/04/21 0419  WBC 3.4*  HGB 11.5*  HCT 36.7*  MCV 88.6  PLT 162   Basic Metabolic Panel: Recent Labs  Lab 01/04/21 0419  NA 137  K 3.7  CL 101  CO2 30  GLUCOSE 135*  BUN 12  CREATININE 0.53*  CALCIUM 8.5*  MG 2.1  PHOS 4.1   GFR: Estimated Creatinine Clearance: 101.5 mL/min (A) (by C-G formula based on SCr of 0.53 mg/dL (L)). Liver Function Tests: Recent Labs  Lab 01/04/21 0419  AST 35  ALT 49*  ALKPHOS 72  BILITOT 0.8  PROT 6.0*  ALBUMIN 2.4*   No results for input(s): LIPASE, AMYLASE in the last 168 hours. No results for input(s): AMMONIA in the last 168 hours. Coagulation Profile: No results for input(s): INR, PROTIME in the last 168 hours. Cardiac Enzymes: No results for input(s): CKTOTAL, CKMB, CKMBINDEX, TROPONINI in the last 168 hours. BNP (last 3 results) No results for input(s): PROBNP in the last 8760 hours. HbA1C: No results for input(s): HGBA1C in the last 72 hours. CBG: Recent Labs  Lab 01/08/21 1123 01/08/21 1724 01/08/21 2112 01/09/21 0617 01/09/21 1148  GLUCAP 148* 169* 133* 138* 156*   Lipid Profile: No results for input(s): CHOL, HDL, LDLCALC, TRIG, CHOLHDL, LDLDIRECT in the last 72 hours. Thyroid Function Tests: No results for input(s): TSH, T4TOTAL, FREET4, T3FREE, THYROIDAB in the last 72 hours. Anemia Panel: No results for input(s): VITAMINB12, FOLATE, FERRITIN, TIBC, IRON, RETICCTPCT in the last 72 hours. Sepsis Labs: No results for input(s): PROCALCITON, LATICACIDVEN in the last 168 hours.  Recent Results (from the past 240 hour(s))  SARS CORONAVIRUS 2 (TAT 6-24 HRS) Nasopharyngeal Nasopharyngeal Swab     Status: None   Collection Time: 01/06/21  5:54 PM   Specimen: Nasopharyngeal Swab  Result Value Ref Range Status   SARS Coronavirus 2 NEGATIVE NEGATIVE Final    Comment: (NOTE) SARS-CoV-2 target nucleic acids are NOT DETECTED.  The SARS-CoV-2 RNA is generally  detectable in upper and lower respiratory specimens during the acute phase of infection. Negative results do not preclude SARS-CoV-2 infection, do not rule out co-infections with other pathogens, and should not be used as the sole basis for treatment or other patient management decisions. Negative results must be combined with clinical observations, patient history, and epidemiological information. The expected result is Negative.  Fact Sheet for Patients: HairSlick.no  Fact Sheet for Healthcare Providers: quierodirigir.com  This test is not yet approved or cleared by the [place] FDA and  has been authorized for detection and/or diagnosis of SARS-CoV-2 by FDA under an Emergency Use Authorization (EUA). This EUA will remain  in effect (meaning this test can be used) for the duration of the COVID-19 declaration under Se ction 564(b)(1) of the Act, 21 U.S.C. section 360bbb-3(b)(1), unless the authorization is terminated or revoked sooner.  Performed at [facility], [address], [place], [place] [address]      Radiology Studies: No results found.  Scheduled Meds:  apixaban  5 mg Oral BID   arformoterol  15 mcg Nebulization BID   aspirin  81 mg Oral Daily   atorvastatin  80 mg Oral Daily   budesonide (PULMICORT) nebulizer solution  0.5 mg Nebulization BID   chlorhexidine  15 mL Mouth Rinse BID   Chlorhexidine Gluconate Cloth  6 each Topical Daily   docusate sodium  100 mg Oral Daily   ezetimibe  10 mg Oral Daily   ferrous sulfate  325 mg Oral BID WC   FLUoxetine  20 mg Oral Daily   gabapentin  300 mg Oral BID   insulin aspart  0-5 Units Subcutaneous QHS   insulin aspart  0-9 Units Subcutaneous TID WC   insulin glargine  5 Units Subcutaneous BID   mouth rinse  15 mL Mouth Rinse q12n4p   metoprolol tartrate  25 mg Oral BID   montelukast  10 mg Oral QHS   multivitamin with minerals  1 tablet Oral Daily    pantoprazole  40 mg Oral QHS   QUEtiapine  12.5 mg Oral QHS   sodium chloride flush  3 mL Intravenous Q12H   tamsulosin  0.4 mg Oral QPC supper   Continuous Infusions:  sodium chloride       LOS: 37 days   Rickey Barbara, MD [facility] Pager On Amion  If 7PM-7AM, please contact night-coverage 01/09/2021, 2:56 PM

## 2021-01-09 NOTE — Plan of Care (Signed)
  Problem: Education: Goal: Knowledge of General Education information will improve Description: Including pain rating scale, medication(s)/side effects and non-pharmacologic comfort measures Outcome: Progressing   Problem: Health Behavior/Discharge Planning: Goal: Ability to manage health-related needs will improve Outcome: Progressing   Problem: Clinical Measurements: Goal: Ability to maintain clinical measurements within normal limits will improve Outcome: Progressing Goal: Will remain free from infection Outcome: Progressing Goal: Diagnostic test results will improve Outcome: Progressing Goal: Respiratory complications will improve Outcome: Progressing Goal: Cardiovascular complication will be avoided Outcome: Progressing   Problem: Ischemic Stroke/TIA Tissue Perfusion: Goal: Complications of ischemic stroke/TIA will be minimized Outcome: Progressing   

## 2021-01-10 DIAGNOSIS — J9601 Acute respiratory failure with hypoxia: Secondary | ICD-10-CM | POA: Diagnosis not present

## 2021-01-10 DIAGNOSIS — J101 Influenza due to other identified influenza virus with other respiratory manifestations: Secondary | ICD-10-CM | POA: Diagnosis not present

## 2021-01-10 LAB — GLUCOSE, CAPILLARY
Glucose-Capillary: 151 mg/dL — ABNORMAL HIGH (ref 70–99)
Glucose-Capillary: 142 mg/dL — ABNORMAL HIGH (ref 70–99)
Glucose-Capillary: 145 mg/dL — ABNORMAL HIGH (ref 70–99)
Glucose-Capillary: 166 mg/dL — ABNORMAL HIGH (ref 70–99)

## 2021-01-10 NOTE — Plan of Care (Signed)
  Problem: Activity: Goal: Risk for activity intolerance will decrease Outcome: Progressing   Problem: Nutrition: Goal: Adequate nutrition will be maintained Outcome: Progressing   Problem: Coping: Goal: Level of anxiety will decrease Outcome: Progressing   Problem: Elimination: Goal: Will not experience complications related to bowel motility Outcome: Progressing   

## 2021-01-10 NOTE — Progress Notes (Signed)
PROGRESS NOTE    Brandon Daniel  KKX:[id number] DOB: [birth year] DOA: 12/03/2020 PCP: Pcp, No    Brief Narrative:  70 year old M with PMH of CAD, HTN and HLD presenting with shortness of breath and admitted for influenza A infection and elevated troponin.  The next day, noted to have acute change in neuro status with acute right-sided weakness.  Work-up indicated left MCA and ACA territory infarct.  Received tPA.  On 6/10, developed respiratory failure requiring intubation, and found to have bilateral lower lobe pneumonia.  Treated with antibiotics and diuretics.  Subsequently extubated on 6/17 and transferred to [facility] service on 6/22.  Has had recurrence of fever and found to have Klebsiella ESBL UTI and staph pneumonia.  Started on meropenem.  Also found to have RLE DVT for which he was started on DOAC.  Has dysphagia and started on TF via cortrak.  However, cortrak fell out on 7/2.  Upgraded to dysphagia 1 diet by SLP.  Seems to have good oral intake with assistance.  Therapy recommended CIR but CIR suggesting SNF.  Assessment & Plan:   Principal Problem:   Influenza A Active Problems:   NSTEMI (non-ST elevated myocardial infarction) (HCC)   CAD (coronary artery disease)   Asthma   Essential hypertension   Hyperlipidemia   Dyspnea   Pleural effusion on left   Stroke (cerebrum) (HCC)   Acute respiratory failure with hypoxia (HCC)   Pressure injury of skin  Left MCA/ACA territory stroke-dysphagia, aphasia, right facial droop and right hemiparesis. 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  LDL 55.  A1c 6.2%. -On Eliquis and aspirin.  Neurology recommendede DAPT once he completes Eliquis course for DVT -Recommend outpatient follow-up with neurology -Neuro interventional radiology recommended outpatient follow-up for ICA stenting -Advanced to dysphagia 2 diet on 7/14. -Continued on Prozac 20 mg daily on 7/12   Non-STEMI: Noted  to have new ST depressions in V6 and worsening T wave inversions in inferior leads.  Seen by cardiology and underwent left heart cath on 6/14 that showed multivessel disease. -Cardiology had been consulted   Acute respiratory failure with hypoxia-initially due to influenza A pneumonia followed by staph pneumonia and aspiration in the setting of dysphagia.  Completed 5 days of IV Unasyn on 7/8.  Resolved. 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  Advanced to dysphagia 2 diet on 7/14. -P.o. intake improved.  Maintaining adequate caloric intake orally.   ESBL Klebsiella UTI -Completed course with 2 days of cefepime, 3 days of meropenem and fosfomycin on 6/29    RLE peroneal DVT-age-indeterminate.   -Decision is made to treat given high risk for extension due to immobility   Prediabetes: A1c 6.2%.  Does not seem to be on medication at home.  -Continue current insulin regimen and a statin. -Adjust insulin as appropriate   Urinary retention, recurrent: Failed multiple voiding trials.  Foley catheter placed back again on 7/2.   -Continue Flomax -Outpatient follow-up with urology for voiding trial   Hypertension/NSVT: TTE with LVEF 55-60%.  Check TSH: 0.833. Normotensive. -Continue metoprolol -Optimize electrolytes   AKI: Resolved   Goals of care -Seen by palliative.  Full scope of care.   Diarrhea: Resolved   Abnormal LFTs: Improved -Check LFT periodically   Hypokalemia: Resolved   Nutrition: See dysphagia above Body mass index is 30.72 kg/m. Nutrition Problem: Inadequate oral  intake Etiology: inability to eat Signs/Symptoms: NPO status Interventions: Tube feeding, Prostat   Pressure skin injury: POA Pressure Injury 12/23/20 Nare Left;Lower Stage 2 -  Partial thickness loss of dermis presenting as a shallow open injury with a red, pink wound bed without slough. Injury to the left  nare where cortrak tie made contact with the skin. (Active)  12/23/20 1800  Location: Nare  Location Orientation: Left;Lower  Staging: Stage 2 -  Partial thickness loss of dermis presenting as a shallow open injury with a red, pink wound bed without slough.  Wound Description (Comments): Injury to the left nare where cortrak tie made contact with the skin.  Present on Admission: No    DVT prophylaxis: Eliquis Code Status: Full Family Communication: Pt in room, family not at bedside  Status is: Inpatient  Remains inpatient appropriate because:Inpatient level of care appropriate due to severity of illness  Dispo: The patient is from: Home              Anticipated d/c is to: SNF              Patient currently is not medically stable to d/c.   Difficult to place patient No  Consultants:  Neurology Cardiology  Procedures:    Antimicrobials: Anti-infectives (From admission, onward)    Start     Dose/Rate Route Frequency Ordered Stop   12/27/20 2300  Ampicillin-Sulbactam (UNASYN) 3 g in sodium chloride 0.9 % 100 mL IVPB  Status:  Discontinued        3 g 200 mL/hr over 30 Minutes Intravenous Every 6 hours 12/27/20 2121 01/01/21 1207   12/23/20 1500  fosfomycin (MONUROL) packet 3 g        3 g Oral  Once 12/23/20 1314 12/23/20 1755   12/20/20 1400  meropenem (MERREM) 1 g in sodium chloride 0.9 % 100 mL IVPB  Status:  Discontinued        1 g 200 mL/hr over 30 Minutes Intravenous Every 8 hours 12/20/20 1123 12/23/20 1314   12/18/20 1100  vancomycin (VANCOREADY) IVPB 1750 mg/350 mL  Status:  Discontinued        1,750 mg 175 mL/hr over 120 Minutes Intravenous Every 24 hours 12/18/20 1002 12/20/20 1626   12/18/20 1045  ceFEPIme (MAXIPIME) 2 g in sodium chloride 0.9 % 100 mL IVPB  Status:  Discontinued        2 g 200 mL/hr over 30 Minutes Intravenous Every 8 hours 12/18/20 0958 12/20/20 1051   12/09/20 0000  cefTRIAXone (ROCEPHIN) 2 g in sodium chloride 0.9 % 100 mL IVPB        2  g 200 mL/hr over 30 Minutes Intravenous Every 24 hours 12/08/20 1044 12/10/20 0003   12/06/20 0000  cefTRIAXone (ROCEPHIN) 2 g in sodium chloride 0.9 % 100 mL IVPB  Status:  Discontinued        2 g 200 mL/hr over 30 Minutes Intravenous Every 24 hours 12/05/20 0033 12/08/20 1044   12/05/20 1000  oseltamivir (TAMIFLU) 6 MG/ML suspension 75 mg        75 mg Per Tube 2 times daily 12/05/20 0755 12/09/20 2157   12/05/20 0200  azithromycin (ZITHROMAX) 500 mg in sodium chloride 0.9 % 250 mL IVPB  Status:  Discontinued        500 mg 250 mL/hr over 60 Minutes Intravenous Every 24 hours 12/05/20 0033 12/08/20 1004   12/05/20 0130  cefTRIAXone (ROCEPHIN) 2 g in sodium chloride 0.9 % 100 mL IVPB  2 g 200 mL/hr over 30 Minutes Intravenous  Once 12/05/20 0035 12/05/20 0145   12/03/20 1030  oseltamivir (TAMIFLU) capsule 75 mg  Status:  Discontinued        75 mg Oral 2 times daily 12/03/20 0929 12/05/20 0755       Subjective: Without complaints  Objective: Vitals:   01/10/21 0734 01/10/21 0845 01/10/21 0847 01/10/21 1141  BP: 134/80   (!) 118/46  Pulse: 89   76  Resp: 20   (!) 22  Temp: 98.3 F (36.8 C)   97.8 F (36.6 C)  TempSrc: Oral   Oral  SpO2: 93% 94% 94% 98%  Weight:      Height:        Intake/Output Summary (Last 24 hours) at 01/10/2021 1529 Last data filed at 01/10/2021 0948 Gross per 24 hour  Intake 240 ml  Output 900 ml  Net -660 ml    Filed Weights   01/08/21 0430 01/09/21 0500 01/10/21 0444  Weight: 94.4 kg 94.2 kg 94.6 kg    Examination: General exam: Conversant, in no acute distress Respiratory system: normal chest rise, clear, no audible wheezing Cardiovascular system: regular rhythm, s1-s2 Gastrointestinal system: Nondistended, nontender, pos BS Central nervous system: No seizures, no tremors Extremities: No cyanosis, no joint deformities Skin: No rashes, no pallor Psychiatry: Affect normal // no auditory hallucinations   Data Reviewed: I have  personally reviewed following labs and imaging studies  CBC: Recent Labs  Lab 01/04/21 0419  WBC 3.4*  HGB 11.5*  HCT 36.7*  MCV 88.6  PLT 162    Basic Metabolic Panel: Recent Labs  Lab 01/04/21 0419  NA 137  K 3.7  CL 101  CO2 30  GLUCOSE 135*  BUN 12  CREATININE 0.53*  CALCIUM 8.5*  MG 2.1  PHOS 4.1    GFR: Estimated Creatinine Clearance: 101.8 mL/min (A) (by C-G formula based on SCr of 0.53 mg/dL (L)). Liver Function Tests: Recent Labs  Lab 01/04/21 0419  AST 35  ALT 49*  ALKPHOS 72  BILITOT 0.8  PROT 6.0*  ALBUMIN 2.4*    No results for input(s): LIPASE, AMYLASE in the last 168 hours. No results for input(s): AMMONIA in the last 168 hours. Coagulation Profile: No results for input(s): INR, PROTIME in the last 168 hours. Cardiac Enzymes: No results for input(s): CKTOTAL, CKMB, CKMBINDEX, TROPONINI in the last 168 hours. BNP (last 3 results) No results for input(s): PROBNP in the last 8760 hours. HbA1C: No results for input(s): HGBA1C in the last 72 hours. CBG: Recent Labs  Lab 01/09/21 1148 01/09/21 1622 01/09/21 2148 01/10/21 0639 01/10/21 1138  GLUCAP 156* 95 166* 151* 142*    Lipid Profile: No results for input(s): CHOL, HDL, LDLCALC, TRIG, CHOLHDL, LDLDIRECT in the last 72 hours. Thyroid Function Tests: No results for input(s): TSH, T4TOTAL, FREET4, T3FREE, THYROIDAB in the last 72 hours. Anemia Panel: No results for input(s): VITAMINB12, FOLATE, FERRITIN, TIBC, IRON, RETICCTPCT in the last 72 hours. Sepsis Labs: No results for input(s): PROCALCITON, LATICACIDVEN in the last 168 hours.  Recent Results (from the past 240 hour(s))  SARS CORONAVIRUS 2 (TAT 6-24 HRS) Nasopharyngeal Nasopharyngeal Swab     Status: None   Collection Time: 01/06/21  5:54 PM   Specimen: Nasopharyngeal Swab  Result Value Ref Range Status   SARS Coronavirus 2 NEGATIVE NEGATIVE Final    Comment: (NOTE) SARS-CoV-2 target nucleic acids are NOT DETECTED.  The  SARS-CoV-2 RNA is generally detectable in upper and lower  respiratory specimens during the acute phase of infection. Negative results do not preclude SARS-CoV-2 infection, do not rule out co-infections with other pathogens, and should not be used as the sole basis for treatment or other patient management decisions. Negative results must be combined with clinical observations, patient history, and epidemiological information. The expected result is Negative.  Fact Sheet for Patients: HairSlick.nohttps://www.fda.gov/media/138098/download  Fact Sheet for Healthcare Providers: quierodirigir.comhttps://www.fda.gov/media/138095/download  This test is not yet approved or cleared by the [place] FDA and  has been authorized for detection and/or diagnosis of SARS-CoV-2 by FDA under an Emergency Use Authorization (EUA). This EUA will remain  in effect (meaning this test can be used) for the duration of the COVID-19 declaration under Se ction 564(b)(1) of the Act, 21 U.S.C. section 360bbb-3(b)(1), unless the authorization is terminated or revoked sooner.  Performed at [facility], [address] [place], [place] [address]       Radiology Studies: No results found.  Scheduled Meds:  apixaban  5 mg Oral BID   arformoterol  15 mcg Nebulization BID   aspirin  81 mg Oral Daily   atorvastatin  80 mg Oral Daily   budesonide (PULMICORT) nebulizer solution  0.5 mg Nebulization BID   chlorhexidine  15 mL Mouth Rinse BID   Chlorhexidine Gluconate Cloth  6 each Topical Daily   docusate sodium  100 mg Oral Daily   ezetimibe  10 mg Oral Daily   ferrous sulfate  325 mg Oral BID WC   FLUoxetine  20 mg Oral Daily   gabapentin  300 mg Oral BID   insulin aspart  0-5 Units Subcutaneous QHS   insulin aspart  0-9 Units Subcutaneous TID WC   insulin glargine  5 Units Subcutaneous BID   mouth rinse  15 mL Mouth Rinse q12n4p   metoprolol tartrate  25 mg Oral BID   montelukast  10 mg Oral QHS   multivitamin with  minerals  1 tablet Oral Daily   pantoprazole  40 mg Oral QHS   QUEtiapine  12.5 mg Oral QHS   sodium chloride flush  3 mL Intravenous Q12H   tamsulosin  0.4 mg Oral QPC supper   Continuous Infusions:  sodium chloride       LOS: 38 days   Rickey BarbaraStephen Topanga Alvelo, MD [facility] Pager On Amion  If 7PM-7AM, please contact night-coverage 01/10/2021, 3:29 PM

## 2021-01-10 NOTE — Plan of Care (Signed)
  Problem: Education: Goal: Knowledge of General Education information will improve Description: Including pain rating scale, medication(s)/side effects and non-pharmacologic comfort measures Outcome: Progressing   Problem: Health Behavior/Discharge Planning: Goal: Ability to manage health-related needs will improve Outcome: Progressing   Problem: Clinical Measurements: Goal: Ability to maintain clinical measurements within normal limits will improve Outcome: Progressing Goal: Will remain free from infection Outcome: Progressing Goal: Diagnostic test results will improve Outcome: Progressing Goal: Respiratory complications will improve Outcome: Progressing Goal: Cardiovascular complication will be avoided Outcome: Progressing   Problem: Activity: Goal: Risk for activity intolerance will decrease Outcome: Progressing   Problem: Nutrition: Goal: Adequate nutrition will be maintained Outcome: Progressing   Problem: Coping: Goal: Level of anxiety will decrease Outcome: Progressing   Problem: Elimination: Goal: Will not experience complications related to bowel motility Outcome: Progressing Goal: Will not experience complications related to urinary retention Outcome: Progressing   Problem: Pain Managment: Goal: General experience of comfort will improve Outcome: Progressing   Problem: Safety: Goal: Ability to remain free from injury will improve Outcome: Progressing   Problem: Skin Integrity: Goal: Risk for impaired skin integrity will decrease Outcome: Progressing   Problem: Education: Goal: Knowledge of disease or condition will improve Outcome: Progressing Goal: Knowledge of secondary prevention will improve Outcome: Progressing Goal: Knowledge of patient specific risk factors addressed and post discharge goals established will improve Outcome: Progressing   Problem: Coping: Goal: Will verbalize positive feelings about self Outcome: Progressing Goal: Will  identify appropriate support needs Outcome: Progressing   Problem: Health Behavior/Discharge Planning: Goal: Ability to manage health-related needs will improve Outcome: Progressing   Problem: Self-Care: Goal: Ability to participate in self-care as condition permits will improve Outcome: Progressing Goal: Verbalization of feelings and concerns over difficulty with self-care will improve Outcome: Progressing Goal: Ability to communicate needs accurately will improve Outcome: Progressing   Problem: Nutrition: Goal: Risk of aspiration will decrease Outcome: Progressing Goal: Dietary intake will improve Outcome: Progressing   Problem: Ischemic Stroke/TIA Tissue Perfusion: Goal: Complications of ischemic stroke/TIA will be minimized Outcome: Progressing   

## 2021-01-11 LAB — GLUCOSE, CAPILLARY
Glucose-Capillary: 156 mg/dL — ABNORMAL HIGH (ref 70–99)
Glucose-Capillary: 163 mg/dL — ABNORMAL HIGH (ref 70–99)
Glucose-Capillary: 137 mg/dL — ABNORMAL HIGH (ref 70–99)
Glucose-Capillary: 195 mg/dL — ABNORMAL HIGH (ref 70–99)

## 2021-01-11 LAB — SARS CORONAVIRUS 2 (TAT 6-24 HRS): SARS Coronavirus 2: NEGATIVE

## 2021-01-11 MED ORDER — OXYBUTYNIN CHLORIDE 5 MG PO TABS: 2.5000 mg | ORAL_TABLET | Freq: Three times a day (TID) | ORAL | Status: DC | PRN

## 2021-01-11 MED ORDER — OXYBUTYNIN CHLORIDE 5 MG PO TABS
5.0000 mg | ORAL_TABLET | Freq: Three times a day (TID) | ORAL | Status: DC | PRN
  Administered 2021-01-11: 5 mg via ORAL
  Filled 2021-01-11: qty 1

## 2021-01-11 NOTE — Plan of Care (Signed)
  Problem: Education: Goal: Knowledge of General Education information will improve Description: Including pain rating scale, medication(s)/side effects and non-pharmacologic comfort measures Outcome: Progressing   Problem: Health Behavior/Discharge Planning: Goal: Ability to manage health-related needs will improve Outcome: Progressing   Problem: Clinical Measurements: Goal: Ability to maintain clinical measurements within normal limits will improve Outcome: Progressing Goal: Will remain free from infection Outcome: Progressing Goal: Diagnostic test results will improve Outcome: Progressing Goal: Respiratory complications will improve Outcome: Progressing Goal: Cardiovascular complication will be avoided Outcome: Progressing   Problem: Activity: Goal: Risk for activity intolerance will decrease Outcome: Progressing   Problem: Nutrition: Goal: Adequate nutrition will be maintained Outcome: Progressing   Problem: Coping: Goal: Level of anxiety will decrease Outcome: Progressing   Problem: Elimination: Goal: Will not experience complications related to bowel motility Outcome: Progressing Goal: Will not experience complications related to urinary retention Outcome: Progressing   Problem: Pain Managment: Goal: General experience of comfort will improve Outcome: Progressing   Problem: Safety: Goal: Ability to remain free from injury will improve Outcome: Progressing   Problem: Skin Integrity: Goal: Risk for impaired skin integrity will decrease Outcome: Progressing   Problem: Education: Goal: Knowledge of disease or condition will improve Outcome: Progressing Goal: Knowledge of secondary prevention will improve Outcome: Progressing Goal: Knowledge of patient specific risk factors addressed and post discharge goals established will improve Outcome: Progressing   Problem: Coping: Goal: Will verbalize positive feelings about self Outcome: Progressing Goal: Will  identify appropriate support needs Outcome: Progressing   Problem: Health Behavior/Discharge Planning: Goal: Ability to manage health-related needs will improve Outcome: Progressing   Problem: Self-Care: Goal: Ability to participate in self-care as condition permits will improve Outcome: Progressing Goal: Verbalization of feelings and concerns over difficulty with self-care will improve Outcome: Progressing Goal: Ability to communicate needs accurately will improve Outcome: Progressing   Problem: Nutrition: Goal: Risk of aspiration will decrease Outcome: Progressing Goal: Dietary intake will improve Outcome: Progressing   Problem: Ischemic Stroke/TIA Tissue Perfusion: Goal: Complications of ischemic stroke/TIA will be minimized Outcome: Progressing   

## 2021-01-11 NOTE — Care Management Important Message (Signed)
Important Message  Patient Details  Name: Brandon Daniel MRN: [id number] Date of Birth: January 02, 1951   Medicare Important Message Given:  Yes     Renie Ora 01/11/2021, 10:00 AM

## 2021-01-11 NOTE — TOC Progression Note (Signed)
Transition of Care [facility]) - Progression Note    Patient Details  Name: Brandon Daniel MRN: [id number] Date of Birth: 06-Jan-1951  [facility] CM/SW Contact  Baldemar Len[place], [place] Phone Number: 01/11/2021, 1:37 PM  Clinical Narrative:   CSW spoke with Admissions at [facility] today to discuss referral, and they contacted patient's insurance to discuss a contract for approval for SNF. Approval has been granted and [facility] will have a bed available tomorrow for the patient. COVID test ordered in preparation for discharge. CSW spoke with fiance N'shama to update and discuss family completing paperwork at [facility]. N'shama to coordinate with family and [facility] to find a time to complete paperwork, as paperwork must be completed before patient can admit to SNF. CSW to continue to follow.    Expected Discharge Plan: Skilled Nursing Facility Barriers to Discharge: Insurance Authorization  Expected Discharge Plan and Services Expected Discharge Plan: Skilled Nursing Facility In-house Referral: Clinical Social Work   Post Acute Care Choice: Long Term Acute Care (LTAC) Living arrangements for the past 2 months: Single Family Home Expected Discharge Date: 01/07/21                                     Social Determinants of Health (SDOH) Interventions    Readmission Risk Interventions No flowsheet data found.

## 2021-01-11 NOTE — Progress Notes (Signed)
PROGRESS NOTE    Pistol Kessenich  GEX:[id number] DOB: [birth year] DOA: 12/03/2020 PCP: Pcp, No    Brief Narrative:  70 year old M with PMH of CAD, HTN and HLD presenting with shortness of breath and admitted for influenza A infection and elevated troponin.  The next day, noted to have acute change in neuro status with acute right-sided weakness.  Work-up indicated left MCA and ACA territory infarct.  Received tPA.  On 6/10, developed respiratory failure requiring intubation, and found to have bilateral lower lobe pneumonia.  Treated with antibiotics and diuretics.  Subsequently extubated on 6/17 and transferred to [facility] service on 6/22.  Has had recurrence of fever and found to have Klebsiella ESBL UTI and staph pneumonia.  Started on meropenem.  Also found to have RLE DVT for which he was started on DOAC.  Has dysphagia and started on TF via cortrak.  However, cortrak fell out on 7/2.  Upgraded to dysphagia 1 diet by SLP.  Seems to have good oral intake with assistance.  Therapy recommended CIR but CIR suggesting SNF.  Assessment & Plan:   Principal Problem:   Influenza A Active Problems:   NSTEMI (non-ST elevated myocardial infarction) (HCC)   CAD (coronary artery disease)   Asthma   Essential hypertension   Hyperlipidemia   Dyspnea   Pleural effusion on left   Stroke (cerebrum) (HCC)   Acute respiratory failure with hypoxia (HCC)   Pressure injury of skin  Left MCA/ACA territory stroke-dysphagia, aphasia, right facial droop and right hemiparesis. 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  LDL 55.  A1c 6.2%. -On Eliquis and aspirin.  Neurology recommended DAPT once he completes Eliquis course for DVT -Recommend outpatient follow-up with neurology -Neuro interventional radiology recommended outpatient follow-up for ICA stenting -Advanced to dysphagia 2 diet on 7/14. -Continued on Prozac 20 mg daily on 7/12   Non-STEMI: Noted to  have new ST depressions in V6 and worsening T wave inversions in inferior leads.  Seen by cardiology and underwent left heart cath on 6/14 that showed multivessel disease. -Remains stable   Acute respiratory failure with hypoxia-initially due to influenza A pneumonia followed by staph pneumonia and aspiration in the setting of dysphagia.  Completed 5 days of IV Unasyn on 7/8.  Resolved. 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  Advanced to dysphagia 2 diet on 7/14. -P.o. intake improved.  Maintaining adequate caloric intake orally.   ESBL Klebsiella UTI -Completed course with 2 days of cefepime, 3 days of meropenem and fosfomycin on 6/29    RLE peroneal DVT-age-indeterminate.   -Decision is made to treat given high risk for extension due to immobility   Prediabetes: A1c 6.2%.  Does not seem to be on medication at home.  -Continue current insulin regimen and a statin. -Continue to titrate insulin as needed   Urinary retention, recurrent: Failed multiple voiding trials.  Foley catheter placed back again on 7/2.   -Continue Flomax -Outpatient follow-up with urology for voiding trial   Hypertension/NSVT: TTE with LVEF 55-60%.  Check TSH: 0.833. Normotensive. -Continue metoprolol -Optimize electrolytes   AKI: Resolved   Goals of care -Seen by palliative.  Full scope of care.   Diarrhea: Resolved   Abnormal LFTs: Improved -Check LFT periodically   Hypokalemia: Resolved   Nutrition: See dysphagia above Body mass index is 30.72 kg/m. Nutrition Problem: Inadequate oral  intake Etiology: inability to eat Signs/Symptoms: NPO status Interventions: Tube feeding, Prostat   Pressure skin injury: POA Pressure Injury 12/23/20 Nare Left;Lower Stage 2 -  Partial thickness loss of dermis presenting as a shallow open injury with a red, pink wound bed without slough. Injury to the left nare where  cortrak tie made contact with the skin. (Active)  12/23/20 1800  Location: Nare  Location Orientation: Left;Lower  Staging: Stage 2 -  Partial thickness loss of dermis presenting as a shallow open injury with a red, pink wound bed without slough.  Wound Description (Comments): Injury to the left nare where cortrak tie made contact with the skin.  Present on Admission: No    DVT prophylaxis: Eliquis Code Status: Full Family Communication: Pt in room, family not at bedside  Status is: Inpatient  Remains inpatient appropriate because:Inpatient level of care appropriate due to severity of illness  Dispo: The patient is from: Home              Anticipated d/c is to: SNF              Patient currently -Patient is medically stable to discharge to SNF   Difficult to place patient No  Consultants:  Neurology Cardiology  Procedures:    Antimicrobials: Anti-infectives (From admission, onward)    Start     Dose/Rate Route Frequency Ordered Stop   12/27/20 2300  Ampicillin-Sulbactam (UNASYN) 3 g in sodium chloride 0.9 % 100 mL IVPB  Status:  Discontinued        3 g 200 mL/hr over 30 Minutes Intravenous Every 6 hours 12/27/20 2121 01/01/21 1207   12/23/20 1500  fosfomycin (MONUROL) packet 3 g        3 g Oral  Once 12/23/20 1314 12/23/20 1755   12/20/20 1400  meropenem (MERREM) 1 g in sodium chloride 0.9 % 100 mL IVPB  Status:  Discontinued        1 g 200 mL/hr over 30 Minutes Intravenous Every 8 hours 12/20/20 1123 12/23/20 1314   12/18/20 1100  vancomycin (VANCOREADY) IVPB 1750 mg/350 mL  Status:  Discontinued        1,750 mg 175 mL/hr over 120 Minutes Intravenous Every 24 hours 12/18/20 1002 12/20/20 1626   12/18/20 1045  ceFEPIme (MAXIPIME) 2 g in sodium chloride 0.9 % 100 mL IVPB  Status:  Discontinued        2 g 200 mL/hr over 30 Minutes Intravenous Every 8 hours 12/18/20 0958 12/20/20 1051   12/09/20 0000  cefTRIAXone (ROCEPHIN) 2 g in sodium chloride 0.9 % 100 mL IVPB        2  g 200 mL/hr over 30 Minutes Intravenous Every 24 hours 12/08/20 1044 12/10/20 0003   12/06/20 0000  cefTRIAXone (ROCEPHIN) 2 g in sodium chloride 0.9 % 100 mL IVPB  Status:  Discontinued        2 g 200 mL/hr over 30 Minutes Intravenous Every 24 hours 12/05/20 0033 12/08/20 1044   12/05/20 1000  oseltamivir (TAMIFLU) 6 MG/ML suspension 75 mg        75 mg Per Tube 2 times daily 12/05/20 0755 12/09/20 2157   12/05/20 0200  azithromycin (ZITHROMAX) 500 mg in sodium chloride 0.9 % 250 mL IVPB  Status:  Discontinued        500 mg 250 mL/hr over 60 Minutes Intravenous Every 24 hours 12/05/20 0033 12/08/20 1004   12/05/20 0130  cefTRIAXone (ROCEPHIN) 2 g in sodium chloride 0.9 % 100  mL IVPB        2 g 200 mL/hr over 30 Minutes Intravenous  Once 12/05/20 0035 12/05/20 0145   12/03/20 1030  oseltamivir (TAMIFLU) capsule 75 mg  Status:  Discontinued        75 mg Oral 2 times daily 12/03/20 0929 12/05/20 0755       Subjective: Complains of some discomfort related to foley cath  Objective: Vitals:   01/11/21 0500 01/11/21 0811 01/11/21 0832 01/11/21 1134  BP:   (!) 147/74 (!) 154/74  Pulse:   (!) 107 (!) 108  Resp:   16 14  Temp:   99.4 F (37.4 C) 98.1 F (36.7 C)  TempSrc:   Oral Oral  SpO2:  95% 92% 93%  Weight: 94.4 kg     Height:        Intake/Output Summary (Last 24 hours) at 01/11/2021 1449 Last data filed at 01/11/2021 1300 Gross per 24 hour  Intake 320 ml  Output 800 ml  Net -480 ml    Filed Weights   01/09/21 0500 01/10/21 0444 01/11/21 0500  Weight: 94.2 kg 94.6 kg 94.4 kg    Examination: General exam: Awake, laying in bed, in nad Respiratory system: Normal respiratory effort, no wheezing Cardiovascular system: regular rate, s1, s2 Gastrointestinal system: Soft, nondistended, positive BS Central nervous system: CN2-12 grossly intact, strength intact Extremities: Perfused, no clubbing Skin: Normal skin turgor, no notable skin lesions seen Psychiatry: Mood normal  // no visual hallucinations   Data Reviewed: I have personally reviewed following labs and imaging studies  CBC: No results for input(s): WBC, NEUTROABS, HGB, HCT, MCV, PLT in the last 168 hours.  Basic Metabolic Panel: No results for input(s): NA, K, CL, CO2, GLUCOSE, BUN, CREATININE, CALCIUM, MG, PHOS in the last 168 hours.  GFR: Estimated Creatinine Clearance: 101.6 mL/min (A) (by C-G formula based on SCr of 0.53 mg/dL (L)). Liver Function Tests: No results for input(s): AST, ALT, ALKPHOS, BILITOT, PROT, ALBUMIN in the last 168 hours.  No results for input(s): LIPASE, AMYLASE in the last 168 hours. No results for input(s): AMMONIA in the last 168 hours. Coagulation Profile: No results for input(s): INR, PROTIME in the last 168 hours. Cardiac Enzymes: No results for input(s): CKTOTAL, CKMB, CKMBINDEX, TROPONINI in the last 168 hours. BNP (last 3 results) No results for input(s): PROBNP in the last 8760 hours. HbA1C: No results for input(s): HGBA1C in the last 72 hours. CBG: Recent Labs  Lab 01/10/21 1138 01/10/21 1536 01/10/21 2154 01/11/21 0615 01/11/21 1211  GLUCAP 142* 145* 166* 156* 163*    Lipid Profile: No results for input(s): CHOL, HDL, LDLCALC, TRIG, CHOLHDL, LDLDIRECT in the last 72 hours. Thyroid Function Tests: No results for input(s): TSH, T4TOTAL, FREET4, T3FREE, THYROIDAB in the last 72 hours. Anemia Panel: No results for input(s): VITAMINB12, FOLATE, FERRITIN, TIBC, IRON, RETICCTPCT in the last 72 hours. Sepsis Labs: No results for input(s): PROCALCITON, LATICACIDVEN in the last 168 hours.  Recent Results (from the past 240 hour(s))  SARS CORONAVIRUS 2 (TAT 6-24 HRS) Nasopharyngeal Nasopharyngeal Swab     Status: None   Collection Time: 01/06/21  5:54 PM   Specimen: Nasopharyngeal Swab  Result Value Ref Range Status   SARS Coronavirus 2 NEGATIVE NEGATIVE Final    Comment: (NOTE) SARS-CoV-2 target nucleic acids are NOT DETECTED.  The SARS-CoV-2  RNA is generally detectable in upper and lower respiratory specimens during the acute phase of infection. Negative results do not preclude SARS-CoV-2 infection, do not  rule out co-infections with other pathogens, and should not be used as the sole basis for treatment or other patient management decisions. Negative results must be combined with clinical observations, patient history, and epidemiological information. The expected result is Negative.  Fact Sheet for Patients: HairSlick.no  Fact Sheet for Healthcare Providers: quierodirigir.com  This test is not yet approved or cleared by the [place] FDA and  has been authorized for detection and/or diagnosis of SARS-CoV-2 by FDA under an Emergency Use Authorization (EUA). This EUA will remain  in effect (meaning this test can be used) for the duration of the COVID-19 declaration under Se ction 564(b)(1) of the Act, 21 U.S.C. section 360bbb-3(b)(1), unless the authorization is terminated or revoked sooner.  Performed at [facility], [address], [place], [place] [address]       Radiology Studies: No results found.  Scheduled Meds:  apixaban  5 mg Oral BID   arformoterol  15 mcg Nebulization BID   aspirin  81 mg Oral Daily   atorvastatin  80 mg Oral Daily   budesonide (PULMICORT) nebulizer solution  0.5 mg Nebulization BID   chlorhexidine  15 mL Mouth Rinse BID   Chlorhexidine Gluconate Cloth  6 each Topical Daily   docusate sodium  100 mg Oral Daily   ezetimibe  10 mg Oral Daily   ferrous sulfate  325 mg Oral BID WC   FLUoxetine  20 mg Oral Daily   gabapentin  300 mg Oral BID   insulin aspart  0-5 Units Subcutaneous QHS   insulin aspart  0-9 Units Subcutaneous TID WC   insulin glargine  5 Units Subcutaneous BID   mouth rinse  15 mL Mouth Rinse q12n4p   metoprolol tartrate  25 mg Oral BID   montelukast  10 mg Oral QHS   multivitamin with minerals  1  tablet Oral Daily   pantoprazole  40 mg Oral QHS   QUEtiapine  12.5 mg Oral QHS   sodium chloride flush  3 mL Intravenous Q12H   tamsulosin  0.4 mg Oral QPC supper   Continuous Infusions:  sodium chloride       LOS: 39 days   Rickey Barbara, MD [facility] Pager On Amion  If 7PM-7AM, please contact night-coverage 01/11/2021, 2:49 PM

## 2021-01-11 NOTE — Progress Notes (Signed)
  Speech Language Pathology Treatment: Cognitive-Linquistic;Dysphagia  Patient Details Name: Brandon Daniel MRN: [id number] DOB: 01/25/1951 Today's Date: 01/11/2021 Time: 6789-3810 SLP Time Calculation (min) (ACUTE ONLY): 10 min  Assessment / Plan / Recommendation Clinical Impression  Session focused on dysphagia with sister at bedside. Pt resting with lunch tray off to side when therapist arrived but agreeable to additional bites/sips Dys 2/nectar thick liquids. Oral cavity was clear when inspected. Right sided labial residue after consumption meat and chopped broccoli was not sensed and significant leak on right with nectar liquids. He could not demonstrated lingual sweep with visual cues. RN report he is consuming crushed pills without difficulty. Continue Dys 2, nectar thick with full assist to place food on left side and remove pocketing.   No spontaneous utterances but vocalized when cued by sister, not therapist. Vocal quality clear. Evidence of decreased comprehension to basic questions and did not respond to automatic speech tasks. Suspect he was wanting to go back to sleep at that time.     HPI HPI: Illness Mr. Norvin Ohlin is a 70 y.o. male admitted 6/9 that  presented with SOB, was found to be Influenza A + and later on day of admission developed mild R facial droop, R arm drift and hand grip weakness along with R leg weakness. Left MCA and ACA CVA. Pt received tPa. VDRF 6/11-6/15. PMH: CAD, HTN, lumbar stenosis, HLD      SLP Plan  Continue with current plan of care       Recommendations  Diet recommendations: Dysphagia 2 (fine chop);Nectar-thick liquid Liquids provided via: Straw;Cup Medication Administration: Crushed with puree Supervision: Staff to assist with self feeding;Full supervision/cueing for compensatory strategies Compensations: Small sips/bites;Lingual sweep for clearance of pocketing;Slow rate;Clear throat intermittently Postural Changes and/or Swallow  Maneuvers: Seated upright 90 degrees;Upright 30-60 min after meal                General recommendations:  (? CIR vs SNF) Oral Care Recommendations: Oral care BID Follow up Recommendations:  (CIR vs SNF?) SLP Visit Diagnosis: Dysphagia, oropharyngeal phase (R13.12);Aphasia (R47.01) Plan: Continue with current plan of care       GO                Royce Macadamia 01/11/2021, 1:15 PM  Breck Coons Lonell Face.Ed Nurse, children's [phone] Office [phone]

## 2021-01-11 NOTE — Progress Notes (Signed)
Physical Therapy Treatment Patient Details Name: Brandon Daniel MRN: [id number] DOB: July 10, 1950 Today's Date: 01/11/2021    History of Present Illness 70 y.o. male admitted 6/9 that  presented with SOB, was found to be Influenza A + and later on day of admission developed mild R facial droop, R arm drift and hand grip weakness along with R leg weakness. Left MCA and ACA CVA. Pt received tPa. VDRF 6/11-6/15. PMH: CAD, HTN, lumbar stenosis, HLD    PT Comments     Today's skilled session focused on LE strengthening. Pt would briefly arouse and the drift back to sleep, therefore only assisting with a few of the ex's performed this session. Acute PT to continue during pt's hospital stay and resume EOB activities when pt more alert.     Follow Up Recommendations  SNF;Supervision/Assistance - 24 hour     Equipment Recommendations  Other (comment) (defer to next venue of care)    Recommendations for Other Services       Precautions / Restrictions Precautions Precautions: Fall Precaution Comments: R hemi    Mobility  Bed Mobility               General bed mobility comments: pt unable to maintain arousal to safety mobilize to EOB this session        Modified Rankin (Stroke Patients Only) Modified Rankin (Stroke Patients Only) Pre-Morbid Rankin Score: No symptoms Modified Rankin: Severe disability     Cognition Arousal/Alertness: Lethargic Behavior During Therapy: Flat affect Overall Cognitive Status: Difficult to assess                    Exercises General Exercises - Lower Extremity Ankle Circles/Pumps: PROM;AAROM;Both;10 reps;Supine Ankle Circles/Pumps Limitations: right passive with heel cord stretching, ? few beats of clonus noted with this exercise. RN in room and made aware. AA on left side. Quad Sets: PROM;AAROM;Strengthening;Both;10 reps;Supine;Limitations Quad Sets Limitations: passive on right. pt assisting on left Heel Slides:  PROM;AAROM;Strengthening;Both;10 reps;Supine;Limitations Heel Slides Limitations: passive on right, pt assisting at times with left side. Hip ABduction/ADduction: PROM;AAROM;Strengthening;Both;10 reps;Supine;Limitations Hip Abduction/Adduction Limitations: passive on right, pt assisted with a few reps on left side Straight Leg Raises: PROM;AAROM;Strengthening;Both;10 reps;Supine;Limitations Straight Leg Raises Limitations: passive on right, pt assisting with a few reps on left side     Pertinent Vitals/Pain Pain Assessment: Faces Faces Pain Scale: Hurts even more Pain Location: with movement of right LE Pain Descriptors / Indicators: Discomfort;Grimacing Pain Intervention(s): Limited activity within patient's tolerance;Monitored during session;Repositioned;Other (comment) (RN notified and to see what pt has for pain)     PT Goals (current goals can now be found in the care plan section) Acute Rehab PT Goals Patient Stated Goal: none stated PT Goal Formulation: With family Time For Goal Achievement: 01/22/21 Potential to Achieve Goals: Fair Progress towards PT goals: Progressing toward goals    Frequency    Min 3X/week      PT Plan Current plan remains appropriate    AM-PAC PT "6 Clicks" Mobility   Outcome Measure  Help needed turning from your back to your side while in a flat bed without using bedrails?: A Lot Help needed moving from lying on your back to sitting on the side of a flat bed without using bedrails?: Total Help needed moving to and from a bed to a chair (including a wheelchair)?: Total Help needed standing up from a chair using your arms (e.g., wheelchair or bedside chair)?: Total Help needed to walk in hospital room?: Total Help  needed climbing 3-5 steps with a railing? : Total 6 Click Score: 7    End of Session   Activity Tolerance: Patient limited by fatigue;Patient limited by lethargy Patient left: in bed;with call bell/phone within reach;with bed  alarm set;with nursing/sitter in room;with family/visitor present Nurse Communication: Mobility status PT Visit Diagnosis: Muscle weakness (generalized) (M62.81);Hemiplegia and hemiparesis Hemiplegia - Right/Left: Right Hemiplegia - dominant/non-dominant: Dominant Hemiplegia - caused by: Cerebral infarction     Time: 6712-4580 PT Time Calculation (min) (ACUTE ONLY): 10 min  Charges:  $Therapeutic Exercise: 8-22 mins                    Sallyanne Kuster, PTA, [facility] Acute Rehab Services Office- [phone] 01/11/21, 1:55 PM    Sallyanne Kuster 01/11/2021, 1:54 PM

## 2021-01-11 NOTE — Plan of Care (Signed)
  Problem: Self-Care: Goal: Ability to participate in self-care as condition permits will improve Outcome: Progressing Goal: Ability to communicate needs accurately will improve Outcome: Progressing   Problem: Nutrition: Goal: Risk of aspiration will decrease Outcome: Progressing Goal: Dietary intake will improve Outcome: Progressing   

## 2021-01-12 LAB — GLUCOSE, CAPILLARY
Glucose-Capillary: 175 mg/dL — ABNORMAL HIGH (ref 70–99)
Glucose-Capillary: 184 mg/dL — ABNORMAL HIGH (ref 70–99)
Glucose-Capillary: 196 mg/dL — ABNORMAL HIGH (ref 70–99)

## 2021-01-12 MED ORDER — DOCUSATE SODIUM 50 MG/5ML PO LIQD
100.0000 mg | Freq: Every day | ORAL | Status: DC
  Administered 2021-01-12: 100 mg
  Filled 2021-01-12: qty 10

## 2021-01-12 MED ORDER — GABAPENTIN 250 MG/5ML PO SOLN
300.0000 mg | Freq: Two times a day (BID) | ORAL | Status: DC
  Administered 2021-01-12: 300 mg via ORAL
  Filled 2021-01-12 (×2): qty 6

## 2021-01-12 NOTE — Progress Notes (Signed)
Occupational Therapy Treatment Patient Details Name: Brandon Daniel MRN: [id number] DOB: 02/26/1951 Today's Date: 01/12/2021    History of present illness 70 y.o. male admitted 6/9 that  presented with SOB, was found to be Influenza A + and later on day of admission developed mild R facial droop, R arm drift and hand grip weakness along with R leg weakness. Left MCA and ACA CVA. Pt received tPa. VDRF 6/11-6/15. PMH: CAD, HTN, lumbar stenosis, HLD   OT comments  Patient upright in bed, NT present assisting with self feeding.  Min assist for feeding with L UE minimal spillage, but cueing for bite size and attention.  NT reports having to assist with drinks.  Patient engaged in grooming tasks, min assist to wash face but mod assist to sequence oral care and wash hands.  Mod cueing to scan and locate items on R side of tray table.  PROM provided to R UE from shoulder to hand, initially grimacing in pain with any forearm/hand movement but faded with gentle PROM (mild grimacing with supination>60*).  SNF remains appropriate.    Follow Up Recommendations  SNF    Equipment Recommendations  Wheelchair cushion (measurements OT);Wheelchair (measurements OT);Hospital bed    Recommendations for Other Services      Precautions / Restrictions Precautions Precautions: Fall Precaution Comments: R hemi Restrictions Weight Bearing Restrictions: No       Mobility Bed Mobility                    Transfers                      Balance                                           ADL either performed or assessed with clinical judgement   ADL Overall ADL's : Needs assistance/impaired Eating/Feeding: Minimal assistance;Sitting;Bed level Eating/Feeding Details (indicate cue type and reason): upright in bed with NT, arrived for end of breakfast with pt using L hand to scoop pineapple and minimal spillage; NT reports having to guide drinks to mouth.  He had full  supervision. Grooming: Therapist, nutritional;Wash/dry hands;Oral care;Bed level;Sitting;Moderate assistance Grooming Details (indicate cue type and reason): sitting upright in bed, requires mod assist to locate items on R side of table.  Min cueing to wash face with L UE, for thoroughness.  Mod assist to correctly sequence brushing teeth and mod assist to wash hands.  Able to apply lotion to R hand with increased time and cueing.                               General ADL Comments: upright in bed, finishing feeding with NT; focused on grooming tasks.  Requires cueing for task attention and probelm solving.  Following 1 step commands.     Vision   Additional Comments: cueing to scan and locate items on R side of tray table   Perception     Praxis      Cognition Arousal/Alertness: Awake/alert Behavior During Therapy: Flat affect Overall Cognitive Status: Difficult to assess Area of Impairment: Problem solving;Awareness;Following commands;Attention                   Current Attention Level: Sustained   Following Commands: Follows one step commands inconsistently;Follows one step commands  with increased time   Awareness: Intellectual Problem Solving: Slow processing;Decreased initiation;Difficulty sequencing;Requires verbal cues;Requires tactile cues General Comments: pt using gestures to express self, no verbalization.  He requires cueing to attend to ADL grooming tasks, cueing to locate items and use correctly. Decreased probelm solving.        Exercises Exercises: Other exercises Other Exercises Other Exercises: PROM to R UE from shoulder to hand, initally reports hand pain but with increased ROM/movement faded  significantly with minimal pain in supination only.  Repositoined on pillows   Shoulder Instructions       General Comments      Pertinent Vitals/ Pain       Pain Assessment: Faces Faces Pain Scale: Hurts little more Pain Location: R UE (hand and  movement into supination) Pain Descriptors / Indicators: Discomfort;Grimacing Pain Intervention(s): Limited activity within patient's tolerance;Monitored during session;Repositioned;Other (comment) (RN notified)  Home Living                                          Prior Functioning/Environment              Frequency  Min 2X/week        Progress Toward Goals  OT Goals(current goals can now be found in the care plan section)  Progress towards OT goals: Progressing toward goals  Acute Rehab OT Goals Patient Stated Goal: none stated OT Goal Formulation: Patient unable to participate in goal setting ADL Goals Pt Will Perform Grooming: with min assist;sitting Pt Will Transfer to Toilet: with max assist;squat pivot transfer;bedside commode  Plan Discharge plan remains appropriate;Frequency remains appropriate    Co-evaluation                 AM-PAC OT "6 Clicks" Daily Activity     Outcome Measure   Help from another person eating meals?: A Little Help from another person taking care of personal grooming?: A Lot Help from another person toileting, which includes using toliet, bedpan, or urinal?: Total Help from another person bathing (including washing, rinsing, drying)?: A Lot Help from another person to put on and taking off regular upper body clothing?: A Lot Help from another person to put on and taking off regular lower body clothing?: Total 6 Click Score: 11    End of Session    OT Visit Diagnosis: Unsteadiness on feet (R26.81);Hemiplegia and hemiparesis;Cognitive communication deficit (R41.841) Symptoms and signs involving cognitive functions: Cerebral infarction Hemiplegia - Right/Left: Right Hemiplegia - dominant/non-dominant: Dominant Hemiplegia - caused by: Cerebral infarction   Activity Tolerance Patient tolerated treatment well   Patient Left in bed;with bed alarm set;with call bell/phone within reach   Nurse Communication  Mobility status        Time: 7939-0300 OT Time Calculation (min): 22 min  Charges: OT General Charges $OT Visit: 1 Visit OT Treatments $Self Care/Home Management : 8-22 mins  Barry Brunner, OT Acute Rehabilitation Services Pager [phone] Office [phone]    Brandon Daniel 01/12/2021, 10:44 AM

## 2021-01-12 NOTE — Progress Notes (Signed)
PROGRESS NOTE    Brandon Daniel  GEX:[id number] DOB: [birth year] DOA: 12/03/2020 PCP: Pcp, No    Brief Narrative:  70 year old M with PMH of CAD, HTN and HLD presenting with shortness of breath and admitted for influenza A infection and elevated troponin.  The next day, noted to have acute change in neuro status with acute right-sided weakness.  Work-up indicated left MCA and ACA territory infarct.  Received tPA.  On 6/10, developed respiratory failure requiring intubation, and found to have bilateral lower lobe pneumonia.  Treated with antibiotics and diuretics.  Subsequently extubated on 6/17 and transferred to [facility] service on 6/22.  Has had recurrence of fever and found to have Klebsiella ESBL UTI and staph pneumonia.  Started on meropenem.  Also found to have RLE DVT for which he was started on DOAC.  Has dysphagia and started on TF via cortrak.  However, cortrak fell out on 7/2.  Upgraded to dysphagia 1 diet by SLP.  Seems to have good oral intake with assistance.  Therapy recommended CIR but CIR suggesting SNF.  Assessment & Plan:   Principal Problem:   Influenza A Active Problems:   NSTEMI (non-ST elevated myocardial infarction) (HCC)   CAD (coronary artery disease)   Asthma   Essential hypertension   Hyperlipidemia   Dyspnea   Pleural effusion on left   Stroke (cerebrum) (HCC)   Acute respiratory failure with hypoxia (HCC)   Pressure injury of skin  Left MCA/ACA territory stroke-dysphagia, aphasia, right facial droop and right hemiparesis. 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  LDL 55.  A1c 6.2%. -On Eliquis and aspirin.  Neurology recommended DAPT once he completes Eliquis course for DVT -Recommend outpatient follow-up with neurology -Neuro interventional radiology recommended outpatient follow-up for ICA stenting -Advanced to dysphagia 2 diet on 7/14. -Continued on Prozac 20 mg daily on 7/12   Non-STEMI: Noted to  have new ST depressions in V6 and worsening T wave inversions in inferior leads.  Seen by cardiology and underwent left heart cath on 6/14 that showed multivessel disease. -Remains stable   Acute respiratory failure with hypoxia-initially due to influenza A pneumonia followed by staph pneumonia and aspiration in the setting of dysphagia.  Completed 5 days of IV Unasyn on 7/8.  Resolved. 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  Advanced to dysphagia 2 diet on 7/14. -P.o. intake improved.  Maintaining adequate caloric intake orally.   ESBL Klebsiella UTI -Completed course with 2 days of cefepime, 3 days of meropenem and fosfomycin on 6/29    RLE peroneal DVT-age-indeterminate.   -Decision is made to treat given high risk for extension due to immobility   Prediabetes: A1c 6.2%.  Does not seem to be on medication at home.  -Continue current insulin regimen and a statin. -Continue to titrate insulin as needed   Urinary retention, recurrent: Failed multiple voiding trials.  Foley catheter placed back again on 7/2.   -Continue Flomax -Outpatient follow-up with urology for voiding trial   Hypertension/NSVT: TTE with LVEF 55-60%.  Check TSH: 0.833. Normotensive. -Continue metoprolol -Optimize electrolytes   AKI: Resolved   Goals of care -Seen by palliative.  Full scope of care.   Diarrhea: Resolved   Abnormal LFTs: Improved -Check LFT periodically   Hypokalemia: Resolved   Nutrition: See dysphagia above Body mass index is 30.72 kg/m. Nutrition Problem: Inadequate oral  intake Etiology: inability to eat Signs/Symptoms: NPO status Interventions: Tube feeding, Prostat   Pressure skin injury: POA Pressure Injury 12/23/20 Nare Left;Lower Stage 2 -  Partial thickness loss of dermis presenting as a shallow open injury with a red, pink wound bed without slough. Injury to the left nare where  cortrak tie made contact with the skin. (Active)  12/23/20 1800  Location: Nare  Location Orientation: Left;Lower  Staging: Stage 2 -  Partial thickness loss of dermis presenting as a shallow open injury with a red, pink wound bed without slough.  Wound Description (Comments): Injury to the left nare where cortrak tie made contact with the skin.  Present on Admission: No    Hematuria -Pink colored urine noted in foley tubing while on anticoagulation -Noted on 7/19 -Hgb trending up, up to 11.5 from 9.7 - Called and discussed case with Urology, Dr. Marlou PorchHerrick, who recommends close outpatient follow up with Urology on d/c. -Would recommend foley wean/bladder training at SNF. Per Urology, could contact [facility] if any issues while weaning foley -OK to continue antiplatelet/anticoagulant at time of according to Urology  DVT prophylaxis: Eliquis Code Status: Full Family Communication: Pt in room, family not at bedside  Status is: Inpatient  Remains inpatient appropriate because:Inpatient level of care appropriate due to severity of illness   Consultants:  Neurology Cardiology  Procedures:    Antimicrobials: Anti-infectives (From admission, onward)    Start     Dose/Rate Route Frequency Ordered Stop   12/27/20 2300  Ampicillin-Sulbactam (UNASYN) 3 g in sodium chloride 0.9 % 100 mL IVPB  Status:  Discontinued        3 g 200 mL/hr over 30 Minutes Intravenous Every 6 hours 12/27/20 2121 01/01/21 1207   12/23/20 1500  fosfomycin (MONUROL) packet 3 g        3 g Oral  Once 12/23/20 1314 12/23/20 1755   12/20/20 1400  meropenem (MERREM) 1 g in sodium chloride 0.9 % 100 mL IVPB  Status:  Discontinued        1 g 200 mL/hr over 30 Minutes Intravenous Every 8 hours 12/20/20 1123 12/23/20 1314   12/18/20 1100  vancomycin (VANCOREADY) IVPB 1750 mg/350 mL  Status:  Discontinued        1,750 mg 175 mL/hr over 120 Minutes Intravenous Every 24 hours 12/18/20 1002 12/20/20 1626   12/18/20  1045  ceFEPIme (MAXIPIME) 2 g in sodium chloride 0.9 % 100 mL IVPB  Status:  Discontinued        2 g 200 mL/hr over 30 Minutes Intravenous Every 8 hours 12/18/20 0958 12/20/20 1051   12/09/20 0000  cefTRIAXone (ROCEPHIN) 2 g in sodium chloride 0.9 % 100 mL IVPB        2 g 200 mL/hr over 30 Minutes Intravenous Every 24 hours 12/08/20 1044 12/10/20 0003   12/06/20 0000  cefTRIAXone (ROCEPHIN) 2 g in sodium chloride 0.9 % 100 mL IVPB  Status:  Discontinued        2 g 200 mL/hr over 30 Minutes Intravenous Every 24 hours 12/05/20 0033 12/08/20 1044   12/05/20 1000  oseltamivir (TAMIFLU) 6 MG/ML suspension 75 mg        75 mg Per Tube 2 times daily 12/05/20 0755 12/09/20 2157   12/05/20 0200  azithromycin (ZITHROMAX) 500 mg in sodium chloride 0.9 % 250 mL IVPB  Status:  Discontinued        500 mg 250 mL/hr over 60 Minutes Intravenous Every 24 hours 12/05/20 0033  12/08/20 1004   12/05/20 0130  cefTRIAXone (ROCEPHIN) 2 g in sodium chloride 0.9 % 100 mL IVPB        2 g 200 mL/hr over 30 Minutes Intravenous  Once 12/05/20 0035 12/05/20 0145   12/03/20 1030  oseltamivir (TAMIFLU) capsule 75 mg  Status:  Discontinued        75 mg Oral 2 times daily 12/03/20 0929 12/05/20 0755       Subjective: Without complaints  Objective: Vitals:   01/12/21 0821 01/12/21 0825 01/12/21 1008 01/12/21 1218  BP: 140/80  (!) 161/68 (!) 143/80  Pulse: (!) 108   96  Resp: 18   16  Temp: 97.7 F (36.5 C)   97.6 F (36.4 C)  TempSrc: Oral   Oral  SpO2: 93% 94%  94%  Weight:      Height:        Intake/Output Summary (Last 24 hours) at 01/12/2021 1223 Last data filed at 01/12/2021 0900 Gross per 24 hour  Intake 420 ml  Output 350 ml  Net 70 ml    Filed Weights   01/10/21 0444 01/11/21 0500 01/12/21 0500  Weight: 94.6 kg 94.4 kg 94.7 kg    Examination: Respiratory system: normal chest rise, clear, no audible wheezing Cardiovascular system: regular rhythm, s1-s2  Data Reviewed: I have personally  reviewed following labs and imaging studies  CBC: No results for input(s): WBC, NEUTROABS, HGB, HCT, MCV, PLT in the last 168 hours.  Basic Metabolic Panel: No results for input(s): NA, K, CL, CO2, GLUCOSE, BUN, CREATININE, CALCIUM, MG, PHOS in the last 168 hours.  GFR: Estimated Creatinine Clearance: 101.8 mL/min (A) (by C-G formula based on SCr of 0.53 mg/dL (L)). Liver Function Tests: No results for input(s): AST, ALT, ALKPHOS, BILITOT, PROT, ALBUMIN in the last 168 hours.  No results for input(s): LIPASE, AMYLASE in the last 168 hours. No results for input(s): AMMONIA in the last 168 hours. Coagulation Profile: No results for input(s): INR, PROTIME in the last 168 hours. Cardiac Enzymes: No results for input(s): CKTOTAL, CKMB, CKMBINDEX, TROPONINI in the last 168 hours. BNP (last 3 results) No results for input(s): PROBNP in the last 8760 hours. HbA1C: No results for input(s): HGBA1C in the last 72 hours. CBG: Recent Labs  Lab 01/11/21 1211 01/11/21 1649 01/11/21 2117 01/12/21 0607 01/12/21 1215  GLUCAP 163* 137* 195* 175* 184*    Lipid Profile: No results for input(s): CHOL, HDL, LDLCALC, TRIG, CHOLHDL, LDLDIRECT in the last 72 hours. Thyroid Function Tests: No results for input(s): TSH, T4TOTAL, FREET4, T3FREE, THYROIDAB in the last 72 hours. Anemia Panel: No results for input(s): VITAMINB12, FOLATE, FERRITIN, TIBC, IRON, RETICCTPCT in the last 72 hours. Sepsis Labs: No results for input(s): PROCALCITON, LATICACIDVEN in the last 168 hours.  Recent Results (from the past 240 hour(s))  SARS CORONAVIRUS 2 (TAT 6-24 HRS) Nasopharyngeal Nasopharyngeal Swab     Status: None   Collection Time: 01/06/21  5:54 PM   Specimen: Nasopharyngeal Swab  Result Value Ref Range Status   SARS Coronavirus 2 NEGATIVE NEGATIVE Final    Comment: (NOTE) SARS-CoV-2 target nucleic acids are NOT DETECTED.  The SARS-CoV-2 RNA is generally detectable in upper and lower respiratory  specimens during the acute phase of infection. Negative results do not preclude SARS-CoV-2 infection, do not rule out co-infections with other pathogens, and should not be used as the sole basis for treatment or other patient management decisions. Negative results must be combined with clinical observations, patient history, and  epidemiological information. The expected result is Negative.  Fact Sheet for Patients: HairSlick.no  Fact Sheet for Healthcare Providers: quierodirigir.com  This test is not yet approved or cleared by the [place] FDA and  has been authorized for detection and/or diagnosis of SARS-CoV-2 by FDA under an Emergency Use Authorization (EUA). This EUA will remain  in effect (meaning this test can be used) for the duration of the COVID-19 declaration under Se ction 564(b)(1) of the Act, 21 U.S.C. section 360bbb-3(b)(1), unless the authorization is terminated or revoked sooner.  Performed at [facility], [address] [place], [place] [address]   SARS CORONAVIRUS 2 (TAT 6-24 HRS) Nasopharyngeal Nasopharyngeal Swab     Status: None   Collection Time: 01/11/21  1:11 PM   Specimen: Nasopharyngeal Swab  Result Value Ref Range Status   SARS Coronavirus 2 NEGATIVE NEGATIVE Final    Comment: (NOTE) SARS-CoV-2 target nucleic acids are NOT DETECTED.  The SARS-CoV-2 RNA is generally detectable in upper and lower respiratory specimens during the acute phase of infection. Negative results do not preclude SARS-CoV-2 infection, do not rule out co-infections with other pathogens, and should not be used as the sole basis for treatment or other patient management decisions. Negative results must be combined with clinical observations, patient history, and epidemiological information. The expected result is Negative.  Fact Sheet for Patients: HairSlick.no  Fact Sheet for  Healthcare Providers: quierodirigir.com  This test is not yet approved or cleared by the [place] FDA and  has been authorized for detection and/or diagnosis of SARS-CoV-2 by FDA under an Emergency Use Authorization (EUA). This EUA will remain  in effect (meaning this test can be used) for the duration of the COVID-19 declaration under Se ction 564(b)(1) of the Act, 21 U.S.C. section 360bbb-3(b)(1), unless the authorization is terminated or revoked sooner.  Performed at [facility], [address], [place], [place] [address]       Radiology Studies: No results found.  Scheduled Meds:  apixaban  5 mg Oral BID   arformoterol  15 mcg Nebulization BID   aspirin  81 mg Oral Daily   atorvastatin  80 mg Oral Daily   budesonide (PULMICORT) nebulizer solution  0.5 mg Nebulization BID   chlorhexidine  15 mL Mouth Rinse BID   Chlorhexidine Gluconate Cloth  6 each Topical Daily   docusate  100 mg Per Tube Daily   ezetimibe  10 mg Oral Daily   ferrous sulfate  325 mg Oral BID WC   FLUoxetine  20 mg Oral Daily   gabapentin  300 mg Oral Q12H   insulin aspart  0-5 Units Subcutaneous QHS   insulin aspart  0-9 Units Subcutaneous TID WC   insulin glargine  5 Units Subcutaneous BID   mouth rinse  15 mL Mouth Rinse q12n4p   metoprolol tartrate  25 mg Oral BID   montelukast  10 mg Oral QHS   multivitamin with minerals  1 tablet Oral Daily   pantoprazole  40 mg Oral QHS   QUEtiapine  12.5 mg Oral QHS   sodium chloride flush  3 mL Intravenous Q12H   tamsulosin  0.4 mg Oral QPC supper   Continuous Infusions:  sodium chloride       LOS: 40 days   Rickey Barbara, MD [facility] Pager On Amion  If 7PM-7AM, please contact night-coverage 01/12/2021, 12:23 PM

## 2021-01-12 NOTE — TOC Transition Note (Signed)
Transition of Care [facility]) - CM/SW Discharge Note   Patient Details  Name: Brandon Daniel MRN: [id number] Date of Birth: [birth year]  Transition of Care [facility]) CM/SW Contact:  Baldemar Lenis, LCSW Phone Number: 01/12/2021, 1:10 PM   Clinical Narrative:   Nurse to call report to [phone], Room 3238.    Final next level of care: Skilled Nursing Facility Barriers to Discharge: Barriers Resolved   Patient Goals and CMS Choice   CMS Medicare.gov Compare Post Acute Care list provided to:: Other (Comment Required) (nephew)    Discharge Placement              Patient chooses bed at: [facility] Patient to be transferred to facility by: [facility] Name of family member notified: Self, Sister Patient and family notified of of transfer: 01/12/21  Discharge Plan and Services In-house Referral: Clinical Social Work   Post Acute Care Choice: Long Term Acute Care (LTAC)                               Social Determinants of Health (SDOH) Interventions     Readmission Risk Interventions No flowsheet data found.

## 2021-01-12 NOTE — Progress Notes (Signed)
Pt has been discharged via [facility]. All belongings has been sent with the pt.

## 2021-01-12 NOTE — Progress Notes (Signed)
RN attempted to give report 2 separate times to receiving facility and no one was available to receive it.

## 2021-01-20 ENCOUNTER — Telehealth (HOSPITAL_COMMUNITY): Payer: Self-pay

## 2021-01-20 NOTE — Telephone Encounter (Signed)
Called to schedule diagnostic angio with Dr. Quay Burow, no answer, left vm. AW

## 2021-02-24 ENCOUNTER — Encounter: Payer: Self-pay | Admitting: Adult Health

## 2021-02-24 ENCOUNTER — Other Ambulatory Visit: Payer: Self-pay

## 2021-02-24 ENCOUNTER — Ambulatory Visit (INDEPENDENT_AMBULATORY_CARE_PROVIDER_SITE_OTHER): Payer: Medicare (Managed Care) | Admitting: Adult Health

## 2021-02-24 VITALS — BP 134/80 | HR 68

## 2021-02-24 DIAGNOSIS — I6523 Occlusion and stenosis of bilateral carotid arteries: Secondary | ICD-10-CM

## 2021-02-24 DIAGNOSIS — I63232 Cerebral infarction due to unspecified occlusion or stenosis of left carotid arteries: Secondary | ICD-10-CM

## 2021-02-24 DIAGNOSIS — G8191 Hemiplegia, unspecified affecting right dominant side: Secondary | ICD-10-CM | POA: Diagnosis not present

## 2021-02-24 DIAGNOSIS — I82441 Acute embolism and thrombosis of right tibial vein: Secondary | ICD-10-CM | POA: Diagnosis not present

## 2021-02-24 NOTE — Progress Notes (Signed)
[facility] [address] [place]. [place] [address] [phone]       HOSPITAL FOLLOW UP NOTE  Brandon Daniel Date of Birth:  02-Feb-1951 Medical Record Number:  [id number]   Reason for Referral:  hospital stroke follow up    SUBJECTIVE:   CHIEF COMPLAINT:  Chief Complaint  Patient presents with   Cerebrovascular Accident    Rm 3 hospital FU, sister- Meyaser, resides at [facility]    HPI:   [name] is a 70 y.o. male with a past medical history significant for, HLD, HTN and lumbar stenosis who presented to [facility] on 12/03/2020 with SOB and diagnoses with influenza A. On the day of admission he developed mild R facial droop, R arm drift and hand grip weakness along with R leg weakness.  Personally reviewed hospitalization pertinent progress notes, lab work and imaging.  Evaluated by Dr. Pearlean Brownie for left MCA/ACA territory stroke s/p tPA secondary to symptomatic high-grade proximal left carotid stenosis. Eval by neuro IR to consider possible intervention recommended outpatient follow-up with hopeful neurological improvement prior to procedure. CTA head/neck left ICA severe stenosis/near occlusion and right ICA stenosis.  Carotid ultrasound right ICA 60 to 79% stenosis and left ICA 80 to 99% stenosis.  EF 55 to 60%.  LDL 55.  A1c 6.2.  Advised to continue DAPT with aspirin and Plavix (on PTA).  Hospital course complicated by NSTEMI, acute respiratory failure/influenza pneumonia/left pleural effusion, urinary retention, ESBL Klebsiella UTI, RLE DVT started on Eliquis in addition to aspirin. Plavix held until completion of Eliquis course. Therapy recommended discharge to SNF. D/c'd to SNF on 7/19.  Today, 02/24/2021, [name] is being seen for hospital follow-up via EMS stretcher accompanied by his sister and 2 EMS personnel.  Currently residing [facility] long-term care.  Sister provides majority of history.  Reports currently working with PT/OT/SLP but no  significant improvement.  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  No residual swallowing difficulties currently on regular diet without difficulty (per sister report).  He remains nonambulatory.  Foley catheter intact for continued urinary retention followed closely by urology.  Sister concerned regarding recent vomiting after meals - of note, episode of emesis in lobby while waiting for visit. Per MAR, current use of Zofran PRN and pantoprazole.  He is also remained on Eliquis, aspirin, atorvastatin and Zetia.  Blood pressure today 134/80.  He has not yet had follow-up with IR (IR attempted to call but unable to contact per epic review).      PERTINENT IMAGING  CT Head WO IV Contrast 10Jun2022 No acute intracranial abnormality. ASPECTS is 10.   MRI Brain WO IV Contrast 10Jun2022 Diffuse gray matter infarction in the left ACA and MCA territories.   Echocardiogram Complete 10Jun2022 Left ventricular ejection fraction, by estimation, is 55 to 60%. The left ventricle has normal function. Left ventricular diastolic parameters are indeterminate. Elevated left ventricular end-diastolic pressure.  Right ventricular systolic function is normal. The right ventricular size is normal. There is normal pulmonary artery systolic pressure. The estimated right ventricular systolic pressure is 11.0 mmHg.  The mitral valve is normal in structure. No evidence of mitral valve regurgitation. No evidence of mitral stenosis.  The aortic valve was not well visualized. Aortic valve regurgitation is not visualized. No aortic stenosis is present.  The inferior vena cava is normal in size with greater than 50% respiratory variability, suggesting right atrial pressure of 3 mmHg.   VAS US Carotid 10Jun2022 Right ICA 60-79%  stenosis Left ICA 80-99% stenosis   CTA Head and Neck W WO IV Contrast 09Jun2022 Negative CTA for emergent large vessel occlusion. Extensive bulky calcified plaque about the  left carotid bulb/proximal left ICA with associated severe near occlusive stenosis. Left ICA, MCA, and ACA markedly attenuated but remain patent distally. Short-segment 70% atheromatous stenosis at the proximal cervical right ICA. Severe left and moderate right vertebral artery origin stenoses.        ROS:   N/A  PMH:  Past Medical History:  Diagnosis Date   Asthma-COPD overlap syndrome (HCC)    CAD (coronary artery disease)    HLD (hyperlipidemia)    HTN (hypertension)    Lumbar disc herniation with radiculopathy    Lumbar stenosis    Osteoporosis    Stroke (HCC)     PSH:  Past Surgical History:  Procedure Laterality Date   CATARACT EXTRACTION, BILATERAL  2021   CORONARY ARTERY BYPASS GRAFT  2005   INGUINAL HERNIA REPAIR  1998   LEFT HEART CATH AND CORS/GRAFTS ANGIOGRAPHY N/A 12/08/2020   Procedure: LEFT HEART CATH AND CORS/GRAFTS ANGIOGRAPHY;  Surgeon: Orpah Cobb, MD;  Location: [facility] INVASIVE CV LAB;  Service: Cardiovascular;  Laterality: N/A;   LUMBAR DISC SURGERY  2019    Social History:  Social History   Socioeconomic History   Marital status: Divorced    Spouse name: Not on file   Number of children: Not on file   Years of education: Not on file   Highest education level: Not on file  Occupational History   Not on file  Tobacco Use   Smoking status: Former    Packs/day: 2.00    Years: 30.00    Pack years: 60.00    Types: Cigarettes   Smokeless tobacco: Never  Substance and Sexual Activity   Alcohol use: Never   Drug use: Never   Sexual activity: Not on file  Other Topics Concern   Not on file  Social History Narrative   02/24/21 living at [facility]   Social Determinants of Health   Financial Resource Strain: Not on file  Food Insecurity: Not on file  Transportation Needs: Not on file  Physical Activity: Not on file  Stress: Not on file  Social Connections: Not on file  Intimate Partner Violence: Not on file    Family History:   Family History  Problem Relation Age of Onset   Cancer Mother    CAD Father    Diabetes Other    Colon cancer Other    Prostate cancer Other     Medications:   Current Outpatient Medications on File Prior to Visit  Medication Sig Dispense Refill   arformoterol (BROVANA) 15 MCG/2ML NEBU Take 2 mLs (15 mcg total) by nebulization 2 (two) times daily. 120 mL    aspirin EC 81 MG tablet Take 81 mg by mouth in the morning and at bedtime. Swallow whole.     budesonide (PULMICORT) 0.5 MG/2ML nebulizer solution Take 2 mLs (0.5 mg total) by nebulization 2 (two) times daily.  12   Cholecalciferol (VITAMIN D) 50 MCG (2000 UT) tablet Take 2,000 Units by mouth daily.     docusate sodium (COLACE) 100 MG capsule Take 1 capsule (100 mg total) by mouth daily. 10 capsule 0   FLUoxetine (PROZAC) 20 MG capsule Take 1 capsule (20 mg total) by mouth daily. 90 capsule 1   gabapentin (NEURONTIN) 300 MG capsule Take 1 capsule (300 mg total) by mouth 2 (two) times daily.  ipratropium-albuterol (DUONEB) 0.5-2.5 (3) MG/3ML SOLN Take 3 mLs by nebulization every 4 (four) hours as needed. 360 mL    metoprolol tartrate (LOPRESSOR) 25 MG tablet Take 1 tablet (25 mg total) by mouth 2 (two) times daily.     nitroGLYCERIN (NITROSTAT) 0.4 MG SL tablet Place 0.4 mg under the tongue every 5 (five) minutes as needed for chest pain.     polyethylene glycol (MIRALAX / GLYCOLAX) 17 g packet Take 17 g by mouth 2 (two) times daily as needed for mild constipation. 14 each 0   senna-docusate (SENOKOT-S) 8.6-50 MG tablet Take 1 tablet by mouth 2 (two) times daily as needed for moderate constipation.     alendronate (FOSAMAX) 70 MG tablet Take 70 mg by mouth once a week. Take with a full glass of water on an empty stomach. (Patient not taking: Reported on 02/24/2021)     apixaban (ELIQUIS) 5 MG TABS tablet Take 1 tablet (5 mg total) by mouth 2 (two) times daily. (Patient not taking: Reported on 02/24/2021) 120 tablet 0   atorvastatin  (LIPITOR) 80 MG tablet Take 80 mg by mouth daily. (Patient not taking: Reported on 02/24/2021)     ezetimibe (ZETIA) 10 MG tablet Take 10 mg by mouth daily. (Patient not taking: Reported on 02/24/2021)     ferrous sulfate 325 (65 FE) MG tablet Take 1 tablet (325 mg total) by mouth 2 (two) times daily with a meal. (Patient not taking: Reported on 02/24/2021) 180 tablet 1   metFORMIN (GLUCOPHAGE) 500 MG tablet Take 1 tablet (500 mg total) by mouth 2 (two) times daily with a meal. (Patient not taking: Reported on 02/24/2021) 60 tablet 11   montelukast (SINGULAIR) 10 MG tablet Take 10 mg by mouth at bedtime. (Patient not taking: Reported on 02/24/2021)     pantoprazole (PROTONIX) 40 MG tablet Take 1 tablet (40 mg total) by mouth at bedtime. (Patient not taking: Reported on 02/24/2021)     Polyvinyl Alcohol-Povidone (REFRESH OP) Place 1 drop into both eyes daily. (Patient not taking: Reported on 02/24/2021)     QUEtiapine (SEROQUEL) 25 MG tablet Take 0.5 tablets (12.5 mg total) by mouth at bedtime. (Patient not taking: Reported on 02/24/2021)     tamsulosin (FLOMAX) 0.4 MG CAPS capsule Take 1 capsule (0.4 mg total) by mouth daily after supper. (Patient not taking: Reported on 02/24/2021) 30 capsule    [DISCONTINUED] insulin aspart (NOVOLOG) 100 UNIT/ML injection Inject 0-9 Units into the skin 3 (three) times daily with meals. CBG 70 - 120: 0 units CBG 121 - 150: 1 unit CBG 151 - 200: 2 units CBG 201 - 250: 3 units CBG 251 - 300: 5 units CBG 301 - 350: 7 units CBG 351 - 400: 9 units CBG > 400: call MD and obtain STAT lab verification 10 mL 11   [DISCONTINUED] insulin glargine (LANTUS) 100 UNIT/ML injection Inject 0.05 mLs (5 Units total) into the skin 2 (two) times daily. 10 mL 11   No current facility-administered medications on file prior to visit.    Allergies:  No Known Allergies    OBJECTIVE:  Physical Exam  Vitals:   02/24/21 1127  BP: 134/80  Pulse: 68   There is no height or weight on file to  calculate BMI. No results found.  General: well developed, well nourished, pleasant middle-age Caucasian male, seated, in no evident distress Head: head normocephalic and atraumatic.   Neck: supple with no carotid or supraclavicular bruits Cardiovascular: regular rate and rhythm, no  murmurs Musculoskeletal: no deformity Skin:  no rash/petichiae Vascular:  Normal pulses all extremities   Neurologic Exam Mental Status: Awake and fully alert.  Severe expressive aphasia - unable to say any words.  Unable to appreciate receptive aphasia.  Nods yes/no appropriately.  Able to follow commands.  Mood and affect appropriate.  Cranial Nerves: Fundoscopic exam reveals sharp disc margins. Pupils equal, briskly reactive to light. Extraocular movements full without nystagmus. Visual fields full to confrontation. Hearing intact. Facial sensation intact.  Right lower facial weakness.  Tongue, palate moves normally and symmetrically.  Motor: Normal bulk and tone. Normal strength in all tested extremity muscles.  Dense right hemiplegia Sensory.: intact to touch , pinprick , position and vibratory sensation.  Denies sensation on RLE Coordination: Rapid alternating movements normal on left side. Finger-to-nose and heel-to-shin performed accurately on left side. Gait and Station: Deferred Reflexes: 1+ and symmetric. Toes downgoing.     NIHSS  17 Modified Rankin  4      ASSESSMENT: Brandon Daniel is a 70 y.o. year old male with left MCA/ACA territory stroke secondary to high-grade proximal left carotid stenosis on 12/03/2020 after presenting with right facial droop and right-sided weakness. Vascular risk factors include HTN, HLD, left ICA stenosis, new dx of DM during admission.  Hospital course complicated by NSTEMI, acute RLE DVT, acute respiratory failure with influenza pneumonia and left pleural effusion and urinary retention     PLAN:  L MCA/ACA stroke :  Significant residual deficits of right  hemiplegia and nonverbal with severe expressive aphasia.  Continue therapies at SNF although suspect minimal recovery as he is currently almost 3 months post stroke Continue aspirin 81 mg daily  and atorvastatin and Zetia for secondary stroke prevention.   Discussed secondary stroke prevention measures and importance of close PCP follow up for aggressive stroke risk factor management. I have gone over the pathophysiology of stroke, warning signs and symptoms, risk factors and their management in some detail with instructions to go to the closest emergency room for symptoms of concern. Bilateral carotid stenosis: needs to f/u with IR Dr. Sherlon Handingodriguez - IR attempted to contact on 7/27 - will reach out to update contact information to schedule f/u Acute RLE DVT: Remains on Eliquis.  Repeat lower extremity Dopplers.  HTN: BP goal <130/90.  Stable on current regimen per PCP HLD: LDL goal <70. Recent LDL 55 on atorvastatin and Zetia per PCP.  DMII: A1c goal<7.0. Recent A1c 6.2.  Monitored by facility    Follow up in 3 months or call earlier if needed   CC:  [facility] provider: Dr. Pearlean BrownieSethi    I spent 56 minutes of face-to-face and non-face-to-face time with patient and sister.  This included previsit chart review including review of recent hospitalization, lab review, study review, order entry, electronic health record documentation, patient and sister education regarding recent stroke including etiology, secondary stroke prevention measures and importance of managing stroke risk factors, residual deficits and further recovery, monitoring of carotids and lower extremities and answered all other questions to patient and sisters satisfaction  Ihor AustinJessica McCue, AGNP-BC  [facility] [address] [place], [place] [id number]  Phone [phone] Fax [phone] Note: This document was prepared with digital dictation and possible smart phrase technology. Any transcriptional errors  that result from this process are unintentional.

## 2021-02-24 NOTE — Patient Instructions (Signed)
Continue working with therapies - residual right hemiplegia and expressive aphasia  Please continue to monitor nausea and vomiting - episode of emesis waiting for todays visit in lobby  Continue aspirin 81 mg daily and Eliquis (apixaban) daily  and atorvastatin  for secondary stroke prevention  Needs to schedule evaluation with Dr. Sherlon Handing to follow up on carotid stenosis  Order placed to completed lower extremity ultrasound to assess for residual DVT - if resolved, will further discuss ongoing use of Eliquis with Dr. Pearlean Brownie  Continue to follow up with PCP regarding cholesterol and blood pressure management  Maintain strict control of hypertension with blood pressure goal below 130/90, diabetes with hemoglobin A1c goal below 6.5% and cholesterol with LDL cholesterol (bad cholesterol) goal below 70 mg/dL.       Followup in the future with me in 3 months or call earlier if needed       Thank you for coming to see [place] at [facility]. I hope we have been able to provide you high quality care today.  You may receive a patient satisfaction survey over the next few weeks. We would appreciate your feedback and comments so that we may continue to improve Seroquel 20 [id number] and the health of our patients.

## 2021-03-05 ENCOUNTER — Telehealth: Payer: Self-pay | Admitting: Adult Health

## 2021-03-05 NOTE — Telephone Encounter (Signed)
Medicare/medicaid no auth require. Sent message to Lupita Leash she will reach out to the patient to schedule

## 2021-03-15 ENCOUNTER — Telehealth (HOSPITAL_COMMUNITY): Payer: Self-pay

## 2021-03-15 NOTE — Telephone Encounter (Signed)
Called to schedule angiogram. Left vm with [facility] to return call. AW

## 2021-03-18 ENCOUNTER — Other Ambulatory Visit: Payer: Self-pay | Admitting: Internal Medicine

## 2021-03-19 ENCOUNTER — Other Ambulatory Visit: Payer: Self-pay | Admitting: Internal Medicine

## 2021-03-19 ENCOUNTER — Other Ambulatory Visit (HOSPITAL_COMMUNITY): Payer: Self-pay | Admitting: Internal Medicine

## 2021-03-19 DIAGNOSIS — R112 Nausea with vomiting, unspecified: Secondary | ICD-10-CM

## 2021-03-24 ENCOUNTER — Ambulatory Visit (HOSPITAL_COMMUNITY)
Admission: RE | Admit: 2021-03-24 | Discharge: 2021-03-24 | Disposition: A | Discharge status: Home / Self Care | Payer: Medicare Other | Source: Ambulatory Visit | Attending: Adult Health | Admitting: Adult Health

## 2021-03-24 ENCOUNTER — Telehealth: Payer: Self-pay

## 2021-03-24 ENCOUNTER — Other Ambulatory Visit: Payer: Self-pay

## 2021-03-24 DIAGNOSIS — I82441 Acute embolism and thrombosis of right tibial vein: Secondary | ICD-10-CM | POA: Diagnosis present

## 2021-03-24 NOTE — Telephone Encounter (Signed)
Received call report from [place] with [facility] [place]. He sts LE venous US completed today for patient and result negative for DVT.   Formal report to follow.

## 2021-03-24 NOTE — Progress Notes (Signed)
Right lower extremity venous duplex has been completed. Preliminary results can be found in CV Proc through chart review.  Results were given to Megan at [facility] NP office.  03/24/21 1:29 PM Olen Cordial RVT

## 2021-03-30 ENCOUNTER — Ambulatory Visit (INDEPENDENT_AMBULATORY_CARE_PROVIDER_SITE_OTHER): Payer: Medicare Other | Admitting: Cardiology

## 2021-03-30 ENCOUNTER — Other Ambulatory Visit: Payer: Self-pay

## 2021-03-30 DIAGNOSIS — I2510 Atherosclerotic heart disease of native coronary artery without angina pectoris: Secondary | ICD-10-CM

## 2021-03-30 DIAGNOSIS — I10 Essential (primary) hypertension: Secondary | ICD-10-CM

## 2021-03-30 DIAGNOSIS — I639 Cerebral infarction, unspecified: Secondary | ICD-10-CM | POA: Diagnosis not present

## 2021-03-30 DIAGNOSIS — I82409 Acute embolism and thrombosis of unspecified deep veins of unspecified lower extremity: Secondary | ICD-10-CM | POA: Insufficient documentation

## 2021-03-30 NOTE — Assessment & Plan Note (Signed)
Currently well controlled.  No changes made.

## 2021-03-30 NOTE — Progress Notes (Signed)
Cardiology Office Note:    Date:  03/30/2021   ID:  Brandon Daniel, DOB [birth year], MRN [id number]  PCP:  Pcp, No   [facility] Providers Cardiologist:  None     Referring MD: Renford Dills, MD   History of Present Illness:    Brandon Daniel is a 70 y.o. male here for the evaluation of NSTEMI, CVA, coronary artery disease, and hypertension.  His cardiologist seen previously in the hospital with Dr. Algie Coffer, Dr. Sharyn Lull.  He was admitted to the hospital 12/03/2020 with acute respiratory failure with hypoxia and precordial chest pain.  Today: [name] is currently on a stretcher. Multivessel CAD with multivessel PCI requiring 18 stents in remote past in [place] S/P CABG the saphenous vein graft occlusion culprit for his non-STEMI, LIMA not visualized.   Severe cerebrovascular disease Status post Acute respiratory failure with hypoxia  He is accompanied by his sister and two paramedics.  Denies any chest pain or shortness of breath.  He gives a thumbs up signal from his stretcher.  He denies any palpitations. No lightheadedness, headaches, syncope, orthopnea, or PND. Also has no lower extremity edema or exertional symptoms.   Past Medical History:  Diagnosis Date   Asthma-COPD overlap syndrome (HCC)    CAD (coronary artery disease)    HLD (hyperlipidemia)    HTN (hypertension)    Lumbar disc herniation with radiculopathy    Lumbar stenosis    Osteoporosis    Stroke [facility])     Past Surgical History:  Procedure Laterality Date   CATARACT EXTRACTION, BILATERAL  2021   CORONARY ARTERY BYPASS GRAFT  2005   INGUINAL HERNIA REPAIR  1998   LEFT HEART CATH AND CORS/GRAFTS ANGIOGRAPHY N/A 12/08/2020   Procedure: LEFT HEART CATH AND CORS/GRAFTS ANGIOGRAPHY;  Surgeon: Orpah Cobb, MD;  Location: [facility] IN[facility];  Service: Cardiovascular;  Laterality: N/A;   LUMBAR DISC SURGERY  2019    Current Medications: Current Meds  Medication Sig   alendronate (FOSAMAX) 70  MG tablet Take 70 mg by mouth once a week. Take with a full glass of water on an empty stomach.   apixaban (ELIQUIS) 5 MG TABS tablet Take 1 tablet (5 mg total) by mouth 2 (two) times daily.   arformoterol (BROVANA) 15 MCG/2ML NEBU Take 2 mLs (15 mcg total) by nebulization 2 (two) times daily.   aspirin EC 81 MG tablet Take 81 mg by mouth in the morning and at bedtime. Swallow whole.   atorvastatin (LIPITOR) 80 MG tablet Take 80 mg by mouth daily.   budesonide (PULMICORT) 0.5 MG/2ML nebulizer solution Take 2 mLs (0.5 mg total) by nebulization 2 (two) times daily.   Cholecalciferol (VITAMIN D) 50 MCG (2000 UT) tablet Take 2,000 Units by mouth daily.   docusate sodium (COLACE) 100 MG capsule Take 1 capsule (100 mg total) by mouth daily.   ezetimibe (ZETIA) 10 MG tablet Take 10 mg by mouth daily.   ferrous sulfate 325 (65 FE) MG tablet Take 1 tablet (325 mg total) by mouth 2 (two) times daily with a meal.   FLUoxetine (PROZAC) 20 MG capsule Take 1 capsule (20 mg total) by mouth daily.   gabapentin (NEURONTIN) 300 MG capsule Take 1 capsule (300 mg total) by mouth 2 (two) times daily.   ipratropium-albuterol (DUONEB) 0.5-2.5 (3) MG/3ML SOLN Take 3 mLs by nebulization every 4 (four) hours as needed.   metFORMIN (GLUCOPHAGE) 500 MG tablet Take 1 tablet (500 mg total) by mouth 2 (two) times daily  with a meal.   metoprolol tartrate (LOPRESSOR) 50 MG tablet Take 50 mg by mouth 2 (two) times daily.   montelukast (SINGULAIR) 10 MG tablet Take 10 mg by mouth at bedtime.   nitroGLYCERIN (NITROSTAT) 0.4 MG SL tablet Place 0.4 mg under the tongue every 5 (five) minutes as needed for chest pain.   pantoprazole (PROTONIX) 40 MG tablet Take 1 tablet (40 mg total) by mouth at bedtime.   polyethylene glycol (MIRALAX / GLYCOLAX) 17 g packet Take 17 g by mouth 2 (two) times daily as needed for mild constipation.   Polyvinyl Alcohol-Povidone (REFRESH OP) Place 1 drop into both eyes daily.   QUEtiapine (SEROQUEL) 25 MG  tablet Take 0.5 tablets (12.5 mg total) by mouth at bedtime.   senna-docusate (SENOKOT-S) 8.6-50 MG tablet Take 1 tablet by mouth 2 (two) times daily as needed for moderate constipation.   tamsulosin (FLOMAX) 0.4 MG CAPS capsule Take 1 capsule (0.4 mg total) by mouth daily after supper.     Allergies:   Patient has no known allergies.   Social History   Socioeconomic History   Marital status: Divorced    Spouse name: Not on file   Number of children: Not on file   Years of education: Not on file   Highest education level: Not on file  Occupational History   Not on file  Tobacco Use   Smoking status: Former    Packs/day: 2.00    Years: 30.00    Pack years: 60.00    Types: Cigarettes   Smokeless tobacco: Never  Substance and Sexual Activity   Alcohol use: Never   Drug use: Never   Sexual activity: Not on file  Other Topics Concern   Not on file  Social History Narrative   02/24/21 living at [facility]   Social Determinants of Health   Financial Resource Strain: Not on file  Food Insecurity: Not on file  Transportation Needs: Not on file  Physical Activity: Not on file  Stress: Not on file  Social Connections: Not on file     Family History: The patient's family history includes CAD in his father; Cancer in his mother; Colon cancer in an other family member; Diabetes in an other family member; Prostate cancer in an other family member.  ROS:   Please see the history of present illness.    All other systems reviewed and are negative.  EKGs/Labs/Other Studies Reviewed:    The following studies were reviewed today:  LHC 12/08/2020: Origin to Insertion lesion is 100% stenosed. Prox Graft lesion is 100% stenosed. RPAV lesion is 95% stenosed. Prox RCA lesion is 25% stenosed. Prox Graft to Dist Graft lesion is 100% stenosed. Ost LM to Dist LM lesion is 40% stenosed. Ost LAD to Prox LAD lesion is 95% stenosed. Prox LAD to Dist LAD lesion is 100% stenosed. 1st  Diag lesion is 99% stenosed. Prox Cx lesion is 40% stenosed. 1st Mrg lesion is 100% stenosed. 2nd Mrg lesion is 95% stenosed. 3rd Mrg lesion is 60% stenosed. RPDA lesion is 70% stenosed.   Medical treatment for now. Discuss case with interventionalist for multiple chronic total or near total occlusions.  Echo 12/04/2020:  1. Left ventricular ejection fraction, by estimation, is 55 to 60%. The  left ventricle has normal function. The left ventricle has no regional  wall motion abnormalities. Left ventricular diastolic parameters are  indeterminate. Elevated left ventricular  end-diastolic pressure.   2. Right ventricular systolic function is normal. The right ventricular  size is normal. There is normal pulmonary artery systolic pressure. The  estimated right ventricular systolic pressure is 11.0 mmHg.   3. The mitral valve is normal in structure. No evidence of mitral valve  regurgitation. No evidence of mitral stenosis.   4. The aortic valve was not well visualized. Aortic valve regurgitation  is not visualized. No aortic stenosis is present.   5. The inferior vena cava is normal in size with greater than 50%  respiratory variability, suggesting right atrial pressure of 3 mmHg.   Carotid Duplex 12/04/2020: Summary:  Right Carotid: Velocities in the right ICA are consistent with a 60-79%                 stenosis.   Left Carotid: Velocities in the left ICA are consistent with a 80-99%  stenosis.   Vertebrals: Bilateral vertebral arteries demonstrate antegrade flow.   CTA Head and Neck 12/03/2020: FINDINGS: CTA NECK FINDINGS   Aortic arch: Examination technically limited by timing the contrast bolus and motion artifact.   Visualized aortic arch normal in caliber with normal 3 vessel morphology. Moderate atheromatous change about the arch and origin of the great vessels without hemodynamically significant stenosis.   Right carotid system: Right CCA patent from its origin to  the bifurcation without stenosis. Moderate atheromatous plaque about the right carotid bulb and proximal right ICA. Focal tortuosity with associated short-segment stenosis of up to approximately 70% by NASCET criteria seen at the proximal a right ICA (series 7, image 193). Right ICA patent distally without stenosis, dissection or occlusion.   Left carotid system: Scattered atheromatous change within the mid left CCA without significant stenosis. Extensive bulky calcified plaque about the left carotid bulb/proximal left ICA with associated severe near occlusive stenosis (series 7, image 225). Area of involvement begins at the carotid bulb and measures approximately 1.5 cm in length. Left ICA markedly attenuated and diminutive but patent distally to the skull base without additional stenosis. No visible dissection or occlusion.   Vertebral arteries: Both vertebral arteries arise from the subclavian arteries. No hemodynamically significant proximal subclavian artery stenosis. Atheromatous change at the origins of both vertebral arteries with associated severe stenosis on the left and moderate stenosis on the right. Vertebral arteries otherwise patent within the neck without stenosis, dissection or occlusion.   Skeleton: No visible acute osseous finding. No discrete or worrisome osseous lesions. Median sternotomy wires noted.   Other neck: No other acute soft tissue abnormality within the neck. Diffuse thyroidal enlargement with punctate dystrophic calcification within the left thyroid lobe. No other mass or adenopathy.   Upper chest: Scattered nodular and ground-glass opacity seen within the upper lobes bilaterally, suspicious for infectious pneumonitis. Visualized upper chest otherwise unremarkable.   Review of the MIP images confirms the above findings   CTA HEAD FINDINGS   Anterior circulation: Right ICA patent from the skull base to the terminus without stenosis. Left ICA  attenuated but patent to the terminus as well without visible stenosis. A1 segments patent. Normal anterior communicating artery complex. Anterior cerebral arteries patent to their distal aspects without stenosis, with the left ACA attenuated as compared to the right.   Right M1 segment widely patent. Normal right MCA bifurcation. Distal right MCA branches well perfused and symmetric.   Attenuated but patent flow within the left M1 segment without stenosis. Normal left MCA bifurcation. No proximal left MCA branch occlusion. Distal left MCA branches attenuated as compared to the right related to the severe proximal left ICA stenosis.  Posterior circulation: Both V4 segments patent to the vertebrobasilar junction without stenosis. Both PICA origins patent and normal. Basilar patent to its distal aspect without stenosis. Superior cerebral arteries patent bilaterally. Both PCAs primarily supplied via the basilar. Both PCAs remain patent and well perfused to their distal aspects.   Venous sinuses: Grossly patent allowing for timing the contrast bolus.   Anatomic variants: None significant.  No visible aneurysm.   Review of the MIP images confirms the above findings   IMPRESSION: 1. Negative CTA for emergent large vessel occlusion. 2. Extensive bulky calcified plaque about the left carotid bulb/proximal left ICA with associated severe near occlusive stenosis. Left ICA, MCA, and ACA markedly attenuated but remain patent distally. 3. Short-segment 70% atheromatous stenosis at the proximal cervical right ICA. 4. Severe left and moderate right vertebral artery origin stenoses. 5. Scattered nodular and ground-glass opacity within the upper lobes bilaterally, suspicious for infectious pneumonitis. Correlation with plain film radiography suggested.  EKG:  EKG is personally reviewed and interpreted. 03/30/2021: EKG was not ordered today.   Recent Labs: 12/03/2020: B Natriuretic Peptide  127.0 12/23/2020: TSH 0.833 01/04/2021: ALT 49; BUN 12; Creatinine, Ser 0.53; Hemoglobin 11.5; Magnesium 2.1; Platelets 162; Potassium 3.7; Sodium 137   Recent Lipid Panel    Component Value Date/Time   CHOL 108 12/04/2020 0219   TRIG 79 12/04/2020 0219   HDL 37 (L) 12/04/2020 0219   CHOLHDL 2.9 12/04/2020 0219   VLDL 16 12/04/2020 0219   LDLCALC 55 12/04/2020 0219        Physical Exam:    VS:  BP (!) 125/91   Pulse 74   SpO2 94%     Wt Readings from Last 3 Encounters:  01/12/21 208 lb 12.4 oz (94.7 kg)  10/20/20 230 lb (104.3 kg)     GEN: Well nourished, well developed in no acute distress HEENT: Normal NECK: No JVD; No carotid bruits LYMPHATICS: No lymphadenopathy CARDIAC: RRR, no murmurs, rubs, gallops RESPIRATORY:  No rales, or rhonchi, Subtle wheezing  ABDOMEN: Soft, non-tender, non-distended MUSCULOSKELETAL:  No edema; No deformity  SKIN: Warm and dry NEUROLOGIC:  Alert and oriented x 3 PSYCHIATRIC:  Normal affect   ASSESSMENT:    1. Coronary artery disease involving native coronary artery of native heart without angina pectoris   2. Essential hypertension   3. Cerebrovascular accident (CVA), unspecified mechanism (HCC)    PLAN:    In order of problems listed above: CAD (coronary artery disease) Severe multivessel coronary artery disease.  Cardiac catheterization reviewed from Dr. Algie Coffer.  Agree with current medical management.  He is not having any anginal symptoms.  Continue with goal-directed medical therapy as described above.  He is on high intensity statin atorvastatin 80 mg once a day.  Zetia 10 mg a day.  Aspirin 81 mg a day.  He is also on Eliquis 5 mg twice a day to treat right upper extremity DVT.  Once this therapy is complete, I would recommend Plavix monotherapy 75 mg once a day continue with metoprolol 25 mg twice a day.  He may continue to follow up with Dr. Lorayne Bender his main cardiologist.  Essential hypertension Currently well  controlled.  No changes made.  Stroke (cerebrum) [facility]) Neurology notes reviewed.  Hospitalization notes reviewed.  Continue with aggressive medical management.  Continue treatment of DVT per primary team.  High risk medical management, extensive review of hospital records, cardiac catheterization, echocardiogram, chart, prior consultation from cardiologist.  Follow-up: PRN.  Medication Adjustments/Labs and Tests Ordered:  Current medicines are reviewed at length with the patient today.  Concerns regarding medicines are outlined above.  No orders of the defined types were placed in this encounter.   No orders of the defined types were placed in this encounter.   Patient Instructions  Medication Instructions:  The current medical regimen is effective;  continue present plan and medications.  *If you need a refill on your cardiac medications before your next appointment, please call your pharmacy*  Follow-Up: At [facility], you and your health needs are our priority.  As part of our continuing mission to provide you with exceptional heart care, we have created designated Provider Care Teams.  These Care Teams include your primary Cardiologist (physician) and Advanced Practice Providers (APPs -  Physician Assistants and Nurse Practitioners) who all work together to provide you with the care you need, when you need it.  We recommend signing up for the patient portal called "MyChart".  Sign up information is provided on this After Visit Summary.  MyChart is used to connect with patients for Virtual Visits (Telemedicine).  Patients are able to view lab/test results, encounter notes, upcoming appointments, etc.  Non-urgent messages can be sent to your provider as well.   To learn more about what you can do with MyChart, go to [email or web address].    Your next appointment:   Follow up as needed with Dr Anne Fu.  Follow up with Dr Robynn Pane Sharyn Lull as directed.  Thank you for  choosing [facility]!!     I,Mathew Stumpf,acting as a scribe for Donato Schultz, MD.,have documented all relevant documentation on the behalf of Donato Schultz, MD,as directed by  Donato Schultz, MD while in the presence of Donato Schultz, MD.  I, Donato Schultz, MD, have reviewed all documentation for this visit. The documentation on 03/30/21 for the exam, diagnosis, procedures, and orders are all accurate and complete.   Signed, Donato Schultz, MD  03/30/2021 1:21 PM    [facility]

## 2021-03-30 NOTE — Patient Instructions (Signed)
Medication Instructions:  The current medical regimen is effective;  continue present plan and medications.  *If you need a refill on your cardiac medications before your next appointment, please call your pharmacy*  Follow-Up: At [facility], you and your health needs are our priority.  As part of our continuing mission to provide you with exceptional heart care, we have created designated Provider Care Teams.  These Care Teams include your primary Cardiologist (physician) and Advanced Practice Providers (APPs -  Physician Assistants and Nurse Practitioners) who all work together to provide you with the care you need, when you need it.  We recommend signing up for the patient portal called "MyChart".  Sign up information is provided on this After Visit Summary.  MyChart is used to connect with patients for Virtual Visits (Telemedicine).  Patients are able to view lab/test results, encounter notes, upcoming appointments, etc.  Non-urgent messages can be sent to your provider as well.   To learn more about what you can do with MyChart, go to [email or web address].    Your next appointment:   Follow up as needed with Dr Anne Fu.  Follow up with Dr Robynn Pane Sharyn Lull as directed.  Thank you for choosing [facility]!!

## 2021-03-30 NOTE — Assessment & Plan Note (Signed)
Severe multivessel coronary artery disease.  Cardiac catheterization reviewed from Dr. Algie Coffer.  Agree with current medical management.  He is not having any anginal symptoms.  Continue with goal-directed medical therapy as described above.  He is on high intensity statin atorvastatin 80 mg once a day.  Zetia 10 mg a day.  Aspirin 81 mg a day.  He is also on Eliquis 5 mg twice a day to treat right upper extremity DVT.  Once this therapy is complete, I would recommend Plavix monotherapy 75 mg once a day continue with metoprolol 25 mg twice a day.  He may continue to follow up with Dr. Lorayne Bender his main cardiologist.

## 2021-03-30 NOTE — Assessment & Plan Note (Signed)
Neurology notes reviewed.  Hospitalization notes reviewed.  Continue with aggressive medical management.

## 2021-04-05 ENCOUNTER — Encounter (HOSPITAL_COMMUNITY): Payer: Medicare Other

## 2021-04-05 ENCOUNTER — Encounter (HOSPITAL_COMMUNITY): Payer: Self-pay

## 2021-04-23 ENCOUNTER — Other Ambulatory Visit: Payer: Self-pay

## 2021-04-23 ENCOUNTER — Ambulatory Visit (HOSPITAL_COMMUNITY): Payer: Medicare Other

## 2021-04-23 NOTE — Patient Outreach (Signed)
[facility]) Care Management  04/23/2021  Aditya Nastasi 1950/09/09 [id number]   Telephone outreach to patient to obtain mRS was successfully completed. MRS= 5  Thank you, Vanice Sarah [facility] Care Management Assistant

## 2021-04-26 ENCOUNTER — Other Ambulatory Visit: Payer: Self-pay

## 2021-04-26 NOTE — Patient Outreach (Signed)
[facility]) Care Management  04/26/2021  Brandon Daniel 06-18-1951 [id number]   Telephone outreach to patient to obtain mRS was successfully completed. MRS= 4  Thank you, Vanice Sarah [facility] Care Management Assistant

## 2021-05-03 ENCOUNTER — Encounter (HOSPITAL_COMMUNITY): Admission: RE | Admit: 2021-05-03 | Payer: Medicare Other | Source: Ambulatory Visit

## 2021-05-03 ENCOUNTER — Ambulatory Visit (HOSPITAL_COMMUNITY)
Admission: RE | Admit: 2021-05-03 | Discharge: 2021-05-03 | Disposition: A | Discharge status: Home / Self Care | Payer: Medicare Other | Source: Ambulatory Visit | Attending: Internal Medicine | Admitting: Internal Medicine

## 2021-05-03 DIAGNOSIS — R112 Nausea with vomiting, unspecified: Secondary | ICD-10-CM | POA: Insufficient documentation

## 2021-05-03 MED ORDER — TECHNETIUM TC 99M SULFUR COLLOID
1.9000 | Freq: Once | INTRAVENOUS | Status: AC | PRN
  Administered 2021-05-03: 1.9 via ORAL

## 2021-05-05 ENCOUNTER — Other Ambulatory Visit: Payer: Self-pay | Admitting: Adult Health

## 2021-05-05 NOTE — Progress Notes (Signed)
error 

## 2021-05-06 ENCOUNTER — Telehealth: Payer: Self-pay

## 2021-05-06 NOTE — Telephone Encounter (Signed)
error 

## 2021-05-06 NOTE — Telephone Encounter (Signed)
Faxed, confirmation received 

## 2021-05-06 NOTE — Telephone Encounter (Signed)
Contacted facility again, spoke to pts nurse Charnell, Informed her to continue Eliquis until the end of December (total of 6 month duration) then switch to aspirin 81mg  daily as prior LE ultrasound showed resolution of prior DVT.  She asked that this order be sent to them   Fax [phone] [facility]

## 2021-05-06 NOTE — Telephone Encounter (Signed)
Contacted facility x2, no answer will try again later. Also sent pt MyChart message relaying same result.

## 2021-05-06 NOTE — Telephone Encounter (Signed)
-----   Message from Ihor Austin, NP sent at 05/05/2021  4:48 PM EST ----- Please advise facility to continue Eliquis until the end of December (total of 6 month duration) then switch to aspirin 81mg  daily as prior LE ultrasound showed resolution of prior DVT. Thank you

## 2021-05-06 NOTE — Telephone Encounter (Signed)
Can we just fax this message? I'm not sure how else they want order to be sent?

## 2021-05-10 ENCOUNTER — Encounter: Payer: Self-pay | Admitting: Gastroenterology

## 2021-05-26 ENCOUNTER — Encounter: Payer: Self-pay | Admitting: Gastroenterology

## 2021-05-26 ENCOUNTER — Ambulatory Visit (INDEPENDENT_AMBULATORY_CARE_PROVIDER_SITE_OTHER): Payer: Medicare Other | Admitting: Gastroenterology

## 2021-05-26 ENCOUNTER — Other Ambulatory Visit (INDEPENDENT_AMBULATORY_CARE_PROVIDER_SITE_OTHER): Payer: Medicare Other

## 2021-05-26 ENCOUNTER — Telehealth: Payer: Self-pay

## 2021-05-26 VITALS — Ht 74.0 in | Wt 163.0 lb

## 2021-05-26 DIAGNOSIS — R112 Nausea with vomiting, unspecified: Secondary | ICD-10-CM | POA: Diagnosis not present

## 2021-05-26 LAB — BASIC METABOLIC PANEL
BUN: 12 mg/dL (ref 6–23)
CO2: 31 mEq/L (ref 19–32)
Calcium: 8 mg/dL — ABNORMAL LOW (ref 8.4–10.5)
Chloride: 103 mEq/L (ref 96–112)
Creatinine, Ser: 0.5 mg/dL (ref 0.40–1.50)
GFR: 105.23 mL/min (ref >60.00)
Glucose, Bld: 78 mg/dL (ref 70–99)
Potassium: 2.8 mEq/L — CL (ref 3.5–5.1)
Sodium: 142 mEq/L (ref 135–145)

## 2021-05-26 MED ORDER — ONDANSETRON HCL 4 MG PO TABS: 4.0000 mg | ORAL_TABLET | Freq: Two times a day (BID) | ORAL | 2 refills | Status: DC

## 2021-05-26 MED ORDER — PANTOPRAZOLE SODIUM 40 MG PO TBEC: 40.0000 mg | DELAYED_RELEASE_TABLET | Freq: Two times a day (BID) | ORAL | 3 refills | Status: DC

## 2021-05-26 NOTE — Telephone Encounter (Signed)
I have called an spoke with [facility] and received a fax number to fax results too [phone].  Will fax as soon as the result is in Epic.

## 2021-05-26 NOTE — Progress Notes (Signed)
05/26/2021 Lasalle Abee [id number] 10-Oct-1950   HISTORY OF PRESENT ILLNESS: This is a 70 year old male who has been brought here today via medical transport on a stretcher from [facility] for evaluation regarding persistent nausea and vomiting.  Unfortunately the patient is nonverbal and cannot provide information.  His sister is present and can provide limited history.  She says that this seems to occur after eating and definitely with movement.  She says that even moving him around to change his clothes, etc.  He had a stroke in June and this is only been occurring since then.  They have him on meclizine and zofran as needed.  They have him on pantoprazole 40 mg daily, but its prescribed for bedtime.  The patient shakes his head no when asked if he has any abdominal pain.  Sister does not report any complaints of pain.  There are no reports of any bowel movement issues.  Unsure about any endoscopic/GI history.  Patient moved here in 2019 from [place] to live near his 2 sisters.  He is on anticoagulation with Eliquis, has COPD and is on oxygen.  Once again, he had a stroke in June and is quite debilitated.   Past Medical History:  Diagnosis Date   Asthma-COPD overlap syndrome (HCC)    CAD (coronary artery disease)    COPD (chronic obstructive pulmonary disease) (HCC)    HLD (hyperlipidemia)    HTN (hypertension)    Lumbar disc herniation with radiculopathy    Lumbar stenosis    Osteoporosis    Stroke [facility])    Past Surgical History:  Procedure Laterality Date   CATARACT EXTRACTION, BILATERAL  2021   CORONARY ARTERY BYPASS GRAFT  2005   INGUINAL HERNIA REPAIR  1998   LEFT HEART CATH AND CORS/GRAFTS ANGIOGRAPHY N/A 12/08/2020   Procedure: LEFT HEART CATH AND CORS/GRAFTS ANGIOGRAPHY;  Surgeon: Orpah Cobb, MD;  Location: [facility] INVASIVE CV LAB;  Service: Cardiovascular;  Laterality: N/A;   LUMBAR DISC SURGERY  2019    reports that he has quit smoking. His smoking use included  cigarettes. He has a 60.00 pack-year smoking history. He has never used smokeless tobacco. He reports that he does not drink alcohol and does not use drugs. family history includes CAD in his father; Cancer in his mother; Colon cancer in an other family member; Diabetes in an other family member; Prostate cancer in an other family member. No Known Allergies    Outpatient Encounter Medications as of 05/26/2021  Medication Sig   alendronate (FOSAMAX) 70 MG tablet Take 70 mg by mouth once a week. Take with a full glass of water on an empty stomach.   apixaban (ELIQUIS) 5 MG TABS tablet Take 1 tablet (5 mg total) by mouth 2 (two) times daily.   arformoterol (BROVANA) 15 MCG/2ML NEBU Take 2 mLs (15 mcg total) by nebulization 2 (two) times daily.   aspirin EC 81 MG tablet Take 81 mg by mouth in the morning and at bedtime. Swallow whole.   atorvastatin (LIPITOR) 80 MG tablet Take 80 mg by mouth daily.   budesonide (PULMICORT) 0.5 MG/2ML nebulizer solution Take 2 mLs (0.5 mg total) by nebulization 2 (two) times daily.   Cholecalciferol (VITAMIN D) 50 MCG (2000 UT) tablet Take 2,000 Units by mouth daily.   docusate sodium (COLACE) 100 MG capsule Take 1 capsule (100 mg total) by mouth daily.   ezetimibe (ZETIA) 10 MG tablet Take 10 mg by mouth daily.   ferrous sulfate 325 (65  FE) MG tablet Take 1 tablet (325 mg total) by mouth 2 (two) times daily with a meal.   FLUoxetine (PROZAC) 20 MG capsule Take 1 capsule (20 mg total) by mouth daily.   gabapentin (NEURONTIN) 300 MG capsule Take 1 capsule (300 mg total) by mouth 2 (two) times daily.   ipratropium-albuterol (DUONEB) 0.5-2.5 (3) MG/3ML SOLN Take 3 mLs by nebulization every 4 (four) hours as needed.   levofloxacin (LEVAQUIN) 750 MG tablet Take 750 mg by mouth daily.   meclizine (ANTIVERT) 25 MG tablet Take 25 mg by mouth as needed for dizziness.   metFORMIN (GLUCOPHAGE) 500 MG tablet Take 1 tablet (500 mg total) by mouth 2 (two) times daily with a meal.    metoprolol tartrate (LOPRESSOR) 50 MG tablet Take 50 mg by mouth 2 (two) times daily.   montelukast (SINGULAIR) 10 MG tablet Take 10 mg by mouth at bedtime.   nitroGLYCERIN (NITROSTAT) 0.4 MG SL tablet Place 0.4 mg under the tongue every 5 (five) minutes as needed for chest pain.   ondansetron (ZOFRAN) 4 MG tablet Take 4 mg by mouth every 8 (eight) hours as needed for nausea or vomiting.   OXYGEN Inhale into the lungs. As needed   pantoprazole (PROTONIX) 40 MG tablet Take 1 tablet (40 mg total) by mouth at bedtime.   polyethylene glycol (MIRALAX / GLYCOLAX) 17 g packet Take 17 g by mouth 2 (two) times daily as needed for mild constipation.   Polyvinyl Alcohol-Povidone (REFRESH OP) Place 1 drop into both eyes daily.   scopolamine (TRANSDERM-SCOP) 1 MG/3DAYS Place 1 patch onto the skin every 3 (three) days.   senna-docusate (SENOKOT-S) 8.6-50 MG tablet Take 1 tablet by mouth 2 (two) times daily as needed for moderate constipation.   tamsulosin (FLOMAX) 0.4 MG CAPS capsule Take 1 capsule (0.4 mg total) by mouth daily after supper.   traMADol (ULTRAM) 50 MG tablet Take 50 mg by mouth every 6 (six) hours as needed.   [DISCONTINUED] QUEtiapine (SEROQUEL) 25 MG tablet Take 0.5 tablets (12.5 mg total) by mouth at bedtime.   No facility-administered encounter medications on file as of 05/26/2021.     REVIEW OF SYSTEMS  : All other systems reviewed and negative except where noted in the History of Present Illness.   PHYSICAL EXAM: Ht 6\' 2"  (1.88 m)   Wt 163 lb (73.9 kg)   BMI 20.93 kg/m  General:  On a stretcher, non-verbal but will shake head yes or no (not sure how reliable he is). Head: Normocephalic and atraumatic Eyes:  Sclerae anicteric, conjunctiva pink. Ears: Normal auditory acuity Lungs: Clear throughout to auscultation; no W/R/R. Heart: Regular rate and rhythm; no M/R/G. Abdomen: Soft, non-distended.  BS present.  Non-tender. Musculoskeletal: Symmetrical with no gross deformities   Skin: No lesions on visible extremities Extremities: No edema  ASSESSMENT AND PLAN: *Nausea and vomiting: Unfortunately the patient is nonverbal and cannot provide information.  His sister is present and can provide limited history.  She says that this seems to occur after eating and definitely with movement.  She says that even moving him around to change his clothes, etc.  He had a stroke in June and this is only been occurring since then.  It certainly could all be neurologic.  GES was normal.  They have him on meclizine and zofran as needed.  He is certainly not an endoscopic candidate at this point given his stroke, debilitated status on a stretcher here in the office today, anticoagulation, oxygen therapy,  etc.  We will proceed with a CT scan of the abdomen and pelvis with contrast to be sure no major issues intra-abdominally.  Need to check a BMP today for renal function.  They have him on pantoprazole 40 mg at bedtime.  I am going to change that to 30 to 60 minutes before meals and increase it to twice daily.  We will schedule his Zofran regularly 3 times a day before meals.   CC:  Renford Dills, MD

## 2021-05-26 NOTE — Patient Instructions (Addendum)
You will be contacted by [facility] in the next 2 days to arrange a CT Abdomen/Pelvis  The number on your caller ID will be [phone], please answer when they call.  If you have not heard from them in 2 days please call [phone] to schedule.     Your provider has requested that you go to the basement level for lab work before leaving today. Press "B" on the elevator. The lab is located at the first door on the left as you exit the elevator.  ( BMET )   Increase Pantoprazole 40 mg 30-60 minutes before breakfast and dinner  Take Zofran 4 mg before meals as needed    Due to recent changes in healthcare laws, you may see the results of your imaging and laboratory studies on MyChart before your provider has had a chance to review them.  We understand that in some cases there may be results that are confusing or concerning to you. Not all laboratory results come back in the same time frame and the provider may be waiting for multiple results in order to interpret others.  Please give [place] 48 hours in order for your provider to thoroughly review all the results before contacting the office for clarification of your results.    If you are age 68 or older, your body mass index should be between 23-30. Your Body mass index is 20.93 kg/m. If this is out of the aforementioned range listed, please consider follow up with your Primary Care Provider.  If you are age 109 or younger, your body mass index should be between 19-25. Your Body mass index is 20.93 kg/m. If this is out of the aformentioned range listed, please consider follow up with your Primary Care Provider.   ________________________________________________________  The [facility] providers would like to encourage you to use [facility] to communicate with providers for non-urgent requests or questions.  Due to long hold times on the telephone, sending your provider a message by [facility] may be a faster and more efficient way to  get a response.  Please allow 48 business hours for a response.  Please remember that this is for non-urgent requests.  _______________________________________________________   I appreciate the  opportunity to care for you  Thank You   Doug Sou, PA-C

## 2021-05-26 NOTE — Telephone Encounter (Signed)
Brandon Daniel I received a call about a critical potassium for this pt of 2.8.  It should be available in Epic soon.

## 2021-05-27 NOTE — Progress Notes (Signed)
____________________________________________________________  Attending physician addendum:  Thank you for sending this case to me. I have reviewed the entire note and agree with the plan.  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  If so, we would not have more treatment to offer.  Patient's neurologist could comment on whether repeat brain imaging warranted. A CTAP is reasonable to r/o an obstructive cause. If this N/V has gotten to, or gets to the point of risking malnutrition or keeps patient from taking essential medicines, and if the patient and family wish to consider aggressive care, the patient's primary team should consult surgery for consideration of a jejunal tube.   Amada Jupiter, MD  ____________________________________________________________

## 2021-06-01 ENCOUNTER — Telehealth: Payer: Self-pay | Admitting: Adult Health

## 2021-06-01 ENCOUNTER — Ambulatory Visit: Payer: Medicare Other | Admitting: Adult Health

## 2021-06-01 NOTE — Telephone Encounter (Signed)
Noted  

## 2021-06-01 NOTE — Telephone Encounter (Signed)
[facility]) cancelled appt due to did not arrange transportation in time for appt.

## 2021-06-03 ENCOUNTER — Ambulatory Visit (INDEPENDENT_AMBULATORY_CARE_PROVIDER_SITE_OTHER): Payer: Medicare Other | Admitting: Adult Health

## 2021-06-03 ENCOUNTER — Other Ambulatory Visit: Payer: Self-pay

## 2021-06-03 ENCOUNTER — Encounter: Payer: Self-pay | Admitting: Adult Health

## 2021-06-03 VITALS — BP 128/64 | HR 67

## 2021-06-03 DIAGNOSIS — I6523 Occlusion and stenosis of bilateral carotid arteries: Secondary | ICD-10-CM

## 2021-06-03 DIAGNOSIS — I63232 Cerebral infarction due to unspecified occlusion or stenosis of left carotid arteries: Secondary | ICD-10-CM | POA: Diagnosis not present

## 2021-06-03 DIAGNOSIS — G8191 Hemiplegia, unspecified affecting right dominant side: Secondary | ICD-10-CM | POA: Diagnosis not present

## 2021-06-03 NOTE — Patient Instructions (Signed)
No change in function and speech ability since prior visit - as his stroke was 6 months ago, he will likely not have any further recovery at this time. Would recommend restart of therapies when able for routine range of motion exercises and prevent spasticity as well as monitoring swallowing status. Continue to monitor right shoulder for pain and treat as needed  Continue aspirin 81 mg daily  and atorvastatin and zetia  for secondary stroke prevention  Stop eliquis after 06/25/2021 as this will complete 6 month treatment for DVT  Continue to follow up with PCP regarding cholesterol and blood pressure management  Maintain strict control of hypertension with blood pressure goal below 130/90 and cholesterol with LDL cholesterol (bad cholesterol) goal below 70 mg/dL.        Followup in the future with me in 6 months or call earlier if needed       Thank you for coming to see [place] at [facility]. I hope we have been able to provide you high quality care today.  You may receive a patient satisfaction survey over the next few weeks. We would appreciate your feedback and comments so that we may continue to improve ourselves and the health of our patients.

## 2021-06-03 NOTE — Progress Notes (Signed)
[facility] [address] [place]. [place] [address] [phone]       STROKE FOLLOW UP NOTE  Brandon Daniel Date of Birth:  Sep 08, 1950 Medical Record Number:  [id number]   Reason for Referral: stroke follow up    SUBJECTIVE:   CHIEF COMPLAINT:  Chief Complaint  Patient presents with   Follow-up    Rm 2 with sister here for 3 month f/u- transport via ems. Sister reports over all doing ok, reports he has not been talking as much.      HPI:   Update 06/03/2021 JM: Returns for 106-month stroke follow-up via stretcher accompanied by sister who provides majority of history. EMS transporters waiting outside of room  Continues to reside at [facility] Stable since prior visit without new stroke/TIA symptoms Residual right hemiplegia, dysphagia and expressive aphasia stable without any improvement since prior visit No longer getting therapies - sister unsure if currently on a break or was not improving  Sister concerned that since he was diagnosed with pneumonia 2 weeks ago, he appears to be more depressed - not talking as much (although only makes grunting noises at baseline) and appetite fluctuates.  Per review of MAR, recently started on fluoxetine for depression. Currently on NTL and mech soft/ground  Sister continues to be frustrated by his lack of progress and questions when they will start to see improvement  Per MAR review, on Eliquis and aspirin.  No obvious side effects.  He is to complete Eliquis at the end of this month as total of 13-month duration will be completed with repeat LE [place] on 03/24/2021 resolution of DVT On atorvastatin and Zetia -denies side effects  Blood pressure today 128/64  No further concerns at this time    History provided for reference purposes only Initial visit 02/24/2021 JM: Brandon Daniel is being seen for hospital follow-up via EMS stretcher accompanied by his sister and 2 EMS personnel.  Currently residing [facility] long-term care.  Sister provides majority of history.  Reports currently working with PT/OT/SLP but no significant improvement.  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  No residual swallowing difficulties currently on regular diet without difficulty (per sister report).  He remains nonambulatory.  Foley catheter intact for continued urinary retention followed closely by urology.  Sister concerned regarding recent vomiting after meals - of note, episode of emesis in lobby while waiting for visit. Per MAR, current use of Zofran PRN and pantoprazole.  He is also remained on Eliquis, aspirin, atorvastatin and Zetia.  Blood pressure today 134/80.  He has not yet had follow-up with IR (IR attempted to call but unable to contact per epic review).   Stroke admission 12/03/2020 Brandon Daniel is a 70 y.o. male with a past medical history significant for, HLD, HTN and lumbar stenosis who presented to [facility] on [facility]/03/2020 with SOB and diagnoses with influenza A. On the day of admission he developed mild R facial droop, R arm drift and hand grip weakness along with R leg weakness.  Personally reviewed hospitalization pertinent progress notes, lab work and imaging.  Evaluated by Dr. Pearlean Brownie for left MCA/ACA territory stroke s/p tPA secondary to symptomatic high-grade proximal left carotid stenosis. Eval by neuro IR to consider possible intervention recommended outpatient follow-up with hopeful neurological improvement prior to procedure. CTA head/neck left ICA severe stenosis/near occlusion and right ICA stenosis.  Carotid ultrasound right ICA 60 to 79% stenosis and left ICA 80 to 99% stenosis.  EF 55 to  60%.  LDL 55.  A1c 6.2.  Advised to continue DAPT with aspirin and Plavix (on PTA).  Hospital course complicated by NSTEMI, acute respiratory failure/influenza pneumonia/left pleural effusion, urinary retention, ESBL Klebsiella UTI, RLE DVT started on Eliquis in addition to aspirin. Plavix held until  completion of Eliquis course. Therapy recommended discharge to SNF. D/c'd to SNF on 7/19.     PERTINENT IMAGING  CT Head WO IV Contrast 10Jun2022 No acute intracranial abnormality. ASPECTS is 10.   MRI Brain WO IV Contrast 10Jun2022 Diffuse gray matter infarction in the left ACA and MCA territories.   Echocardiogram Complete 10Jun2022 Left ventricular ejection fraction, by estimation, is 55 to 60%. The left ventricle has normal function. Left ventricular diastolic parameters are indeterminate. Elevated left ventricular end-diastolic pressure.  Right ventricular systolic function is normal. The right ventricular size is normal. There is normal pulmonary artery systolic pressure. The estimated right ventricular systolic pressure is 0000000 mmHg.  The mitral valve is normal in structure. No evidence of mitral valve regurgitation. No evidence of mitral stenosis.  The aortic valve was not well visualized. Aortic valve regurgitation is not visualized. No aortic stenosis is present.  The inferior vena cava is normal in size with greater than 50% respiratory variability, suggesting right atrial pressure of 3 mmHg.   VAS US Carotid 10Jun2022 Right ICA 60-79% stenosis Left ICA 80-99% stenosis   CTA Head and Neck W WO IV Contrast 09Jun2022 Negative CTA for emergent large vessel occlusion. Extensive bulky calcified plaque about the left carotid bulb/proximal left ICA with associated severe near occlusive stenosis. Left ICA, MCA, and ACA markedly attenuated but remain patent distally. Short-segment 70% atheromatous stenosis at the proximal cervical right ICA. Severe left and moderate right vertebral artery origin stenoses.        ROS:   N/A  PMH:  Past Medical History:  Diagnosis Date   Asthma-COPD overlap syndrome (HCC)    CAD (coronary artery disease)    COPD (chronic obstructive pulmonary disease) (HCC)    HLD (hyperlipidemia)    HTN (hypertension)    Lumbar disc herniation with  radiculopathy    Lumbar stenosis    Osteoporosis    Stroke ([place])     PSH:  Past Surgical History:  Procedure Laterality Date   CATARACT EXTRACTION, BILATERAL  2021   CORONARY ARTERY BYPASS GRAFT  2005   [place]   LEFT HEART CATH AND CORS/GRAFTS ANGIOGRAPHY N/A 12/08/2020   Procedure: LEFT HEART CATH AND CORS/GRAFTS ANGIOGRAPHY;  Surgeon: Dixie Dials, MD;  Location: [facility];  Service: Cardiovascular;  Laterality: N/A;   LUMBAR DISC SURGERY  2019    Social History:  Social History   Socioeconomic History   Marital status: Divorced    Spouse name: Not on file   Number of children: Not on file   Years of education: Not on file   Highest education level: Not on file  Occupational History   Not on file  Tobacco Use   Smoking status: Former    Packs/day: 2.00    Years: 30.00    Pack years: 60.00    Types: Cigarettes   Smokeless tobacco: Never  Substance and Sexual Activity   Alcohol use: Never   Drug use: Never   Sexual activity: Not on file  Other Topics Concern   Not on file  Social History Narrative   02/24/21 living at [facility] SNF   Social Determinants of Health   Financial Resource Strain: Not on  file  Food Insecurity: Not on file  Transportation Needs: Not on file  Physical Activity: Not on file  Stress: Not on file  Social Connections: Not on file  Intimate Partner Violence: Not on file    Family History:  Family History  Problem Relation Age of [place]   Cancer Mother    CAD Father    Diabetes Other    Colon cancer Other    Prostate cancer Other     Medications:   Current Outpatient Medications on File Prior to Visit  Medication Sig Dispense Refill   alendronate (FOSAMAX) 70 MG tablet Take 70 mg by mouth once a week. Take with a full glass of water on an empty stomach.     apixaban (ELIQUIS) 5 MG TABS tablet Take 1 tablet (5 mg total) by mouth 2 (two) times daily. 120 tablet 0   arformoterol (BROVANA) 15 MCG/2ML  NEBU Take 2 mLs (15 mcg total) by nebulization 2 (two) times daily. 120 mL    aspirin EC 81 MG tablet Take 81 mg by mouth in the morning and at bedtime. Swallow whole.     atorvastatin (LIPITOR) 80 MG tablet Take 80 mg by mouth daily.     budesonide (PULMICORT) 0.5 MG/2ML nebulizer solution Take 2 mLs (0.5 mg total) by nebulization 2 (two) times daily.  12   Cholecalciferol (VITAMIN D) 50 MCG (2000 UT) tablet Take 2,000 Units by mouth daily.     docusate sodium (COLACE) 100 MG capsule Take 1 capsule (100 mg total) by mouth daily. 10 capsule 0   ezetimibe (ZETIA) 10 MG tablet Take 10 mg by mouth daily.     ferrous sulfate 325 (65 FE) MG tablet Take 1 tablet (325 mg total) by mouth 2 (two) times daily with a meal. 180 tablet 1   FLUoxetine (PROZAC) 20 MG capsule Take 1 capsule (20 mg total) by mouth daily. 90 capsule 1   gabapentin (NEURONTIN) 300 MG capsule Take 1 capsule (300 mg total) by mouth 2 (two) times daily.     ipratropium-albuterol (DUONEB) 0.5-2.5 (3) MG/3ML SOLN Take 3 mLs by nebulization every 4 (four) hours as needed. 360 mL    levofloxacin (LEVAQUIN) 750 MG tablet Take 750 mg by mouth daily.     meclizine (ANTIVERT) 25 MG tablet Take 25 mg by mouth as needed for dizziness.     metFORMIN (GLUCOPHAGE) 500 MG tablet Take 1 tablet (500 mg total) by mouth 2 (two) times daily with a meal. 60 tablet 11   metoprolol tartrate (LOPRESSOR) 50 MG tablet Take 50 mg by mouth 2 (two) times daily.     montelukast (SINGULAIR) 10 MG tablet Take 10 mg by mouth at bedtime.     nitroGLYCERIN (NITROSTAT) 0.4 MG SL tablet Place 0.4 mg under the tongue every 5 (five) minutes as needed for chest pain.     ondansetron (ZOFRAN) 4 MG tablet Take 1 tablet (4 mg total) by mouth 2 (two) times daily before a meal. 60 tablet 2   OXYGEN Inhale into the lungs. As needed     pantoprazole (PROTONIX) 40 MG tablet Take 1 tablet (40 mg total) by mouth 2 (two) times daily before a meal. 60 tablet 3   polyethylene glycol  (MIRALAX / GLYCOLAX) 17 g packet Take 17 g by mouth 2 (two) times daily as needed for mild constipation. 14 each 0   Polyvinyl Alcohol-Povidone (REFRESH OP) Place 1 drop into both eyes daily.     scopolamine (TRANSDERM-SCOP) 1 MG/3DAYS  Place 1 patch onto the skin every 3 (three) days.     senna-docusate (SENOKOT-S) 8.6-50 MG tablet Take 1 tablet by mouth 2 (two) times daily as needed for moderate constipation.     tamsulosin (FLOMAX) 0.4 MG CAPS capsule Take 1 capsule (0.4 mg total) by mouth daily after supper. 30 capsule    traMADol (ULTRAM) 50 MG tablet Take 50 mg by mouth every 6 (six) hours as needed.     No current facility-administered medications on file prior to visit.    Allergies:  No Known Allergies    OBJECTIVE:  Physical Exam  Vitals:   06/03/21 0923  BP: 128/64  Pulse: 67  SpO2: 90%    There is no height or weight on file to calculate BMI. No results found.  General: well developed, well nourished, pleasant middle-age Caucasian male, seated, in no evident distress Head: head normocephalic and atraumatic.   Neck: supple with no carotid or supraclavicular bruits Cardiovascular: regular rate and rhythm, no murmurs Musculoskeletal: no deformity Skin:  no rash/petichiae Vascular:  Normal pulses all extremities   Neurologic Exam Mental Status: Awake and fully alert. Severe expressive aphasia/mute.  Unable to appreciate receptive aphasia - able to follow commands without difficulty.  Nods yes/no appropriately. Mood and affect appropriate.  Cranial Nerves: Pupils equal, briskly reactive to light. Extraocular movements full without nystagmus. Visual fields full to confrontation. Hearing intact. Facial sensation intact.  Right lower facial weakness.  Tongue, palate moves normally and symmetrically.  Motor: Normal bulk and tone. Normal strength in all tested extremity muscles.  Dense right hemiplegia Sensory.: does not respond to painful stimuli on right side.  Sensation  appears intact left side Coordination: Rapid alternating movements normal on left side. Finger-to-nose and heel-to-shin performed accurately on left side. Gait and Station: Deferred Reflexes: 1+ and symmetric. Toes downgoing.          ASSESSMENT: Brandon Daniel is a 70 y.o. year old male with left MCA/ACA territory stroke secondary to high-grade proximal left carotid stenosis on 12/03/2020 after presenting with right facial droop and right-sided weakness. Vascular risk factors include HTN, HLD, left ICA stenosis, new dx of DM during admission.  Hospital course complicated by NSTEMI, acute RLE DVT, acute respiratory failure with influenza pneumonia and left pleural effusion and urinary retention     PLAN:  L MCA/ACA stroke :  Significant residual deficits of right hemiplegia, dysphagia and nonverbal with severe expressive aphasia.  Advised sister as his stroke was 6 months ago and has not made any obvious recovery, he will likely not make any additional recovery at this time. Would recommend restarting therapies once able to continue to monitor dysphagia and ensure routine ROM Continue aspirin 81 mg daily  and atorvastatin and Zetia for secondary stroke prevention.   Discussed secondary stroke prevention measures and importance of close PCP follow up for aggressive stroke risk factor management. I have gone over the pathophysiology of stroke, warning signs and symptoms, risk factors and their management in some detail with instructions to go to the closest emergency room for symptoms of concern. Bilateral carotid stenosis: needs to f/u with IR Dr. Norma Fredrickson - IR attempted to contact facility without any return call. Advised sister to follow up on this with ether herself making appointment or having SNF contact IR. During the interval time, will place order to repeat carotid ultrasound Acute RLE DVT, resolved: repeat LE dopplar 02/2021 showed resolution of prior DVT. Stop Eliquis at the end of this  month as 6  months DAPT completed at that time HTN: BP goal <130/90.  Stable on current regimen per PCP HLD: LDL goal <70. On atorvastatin and Zetia per PCP.  Lab work monitored by facility DMII: A1c goal<7.0.  Monitored by facility    Follow up in 6 months or call earlier if needed    I spent 38 minutes of face-to-face and non-face-to-face time with patient and sister.  This included previsit chart review, lab review, study review, order entry, electronic health record documentation, patient and sister education regarding prior stroke including etiology, secondary stroke prevention measures and importance of managing stroke risk factors, residual deficits and answered all other questions to patient and sisters satisfaction  Frann Rider, [facility]  [facility] [address] [place], [place] [address]  Phone [phone] Fax [phone] Note: This document was prepared with digital dictation and possible smart phrase technology. Any transcriptional errors that result from this process are unintentional.

## 2021-06-08 ENCOUNTER — Ambulatory Visit (HOSPITAL_COMMUNITY)
Admission: RE | Admit: 2021-06-08 | Discharge: 2021-06-08 | Disposition: A | Discharge status: Home / Self Care | Payer: Medicare Other | Source: Ambulatory Visit | Attending: Gastroenterology | Admitting: Gastroenterology

## 2021-06-08 ENCOUNTER — Encounter (HOSPITAL_COMMUNITY): Payer: Self-pay

## 2021-06-08 ENCOUNTER — Other Ambulatory Visit: Payer: Self-pay

## 2021-06-08 DIAGNOSIS — R112 Nausea with vomiting, unspecified: Secondary | ICD-10-CM | POA: Insufficient documentation

## 2021-06-08 MED ORDER — SODIUM CHLORIDE (PF) 0.9 % IJ SOLN
INTRAMUSCULAR | Status: AC
  Filled 2021-06-08: qty 50

## 2021-06-08 MED ORDER — IOHEXOL 350 MG/ML SOLN
75.0000 mL | Freq: Once | INTRAVENOUS | Status: AC | PRN
  Administered 2021-06-08: 75 mL via INTRAVENOUS

## 2021-06-09 ENCOUNTER — Telehealth: Payer: Self-pay | Admitting: Adult Health

## 2021-06-09 NOTE — Telephone Encounter (Signed)
[employer] medicare/medicaid no auth req, sent message to Lupita Leash she will reach out to the patient to schedule.

## 2021-07-31 IMAGING — CT CT HEAD W/O CM
4 series · 16 of 47 positions shown, 18 images · non-contrast
Comparison: Prior studies from 12/03/2020.

CLINICAL DATA: Initial evaluation for cerebral hemorrhage.

EXAM:
CT HEAD WITHOUT CONTRAST
TECHNIQUE: Contiguous axial images were obtained from the base of the skull
through the vertex without intravenous contrast.

[Series 2: head wo · axial · 0.46mm/px · z∈[+1219,+1339]mm · 7 of 34 slices shown, 9 images]
[im 5/34  brain]
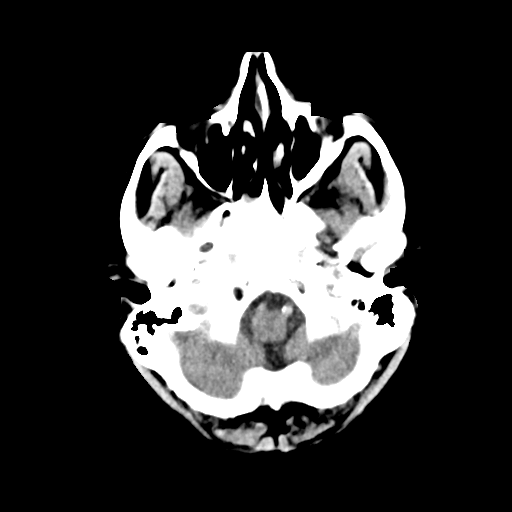
[im 5/34  bone]
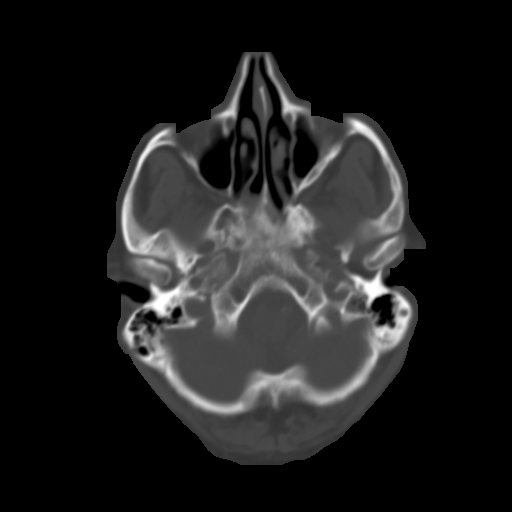
[im 9/34  brain]
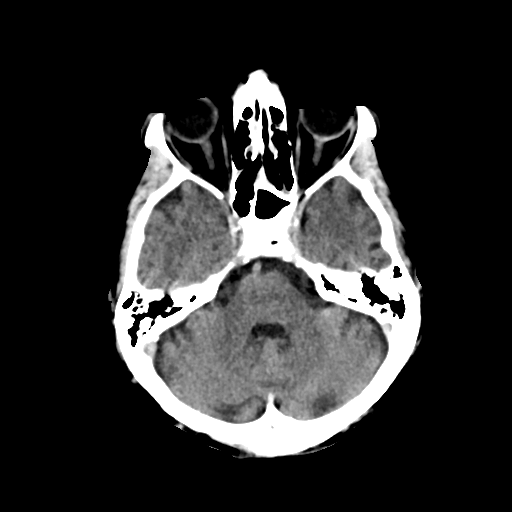
[im 13/34  brain]
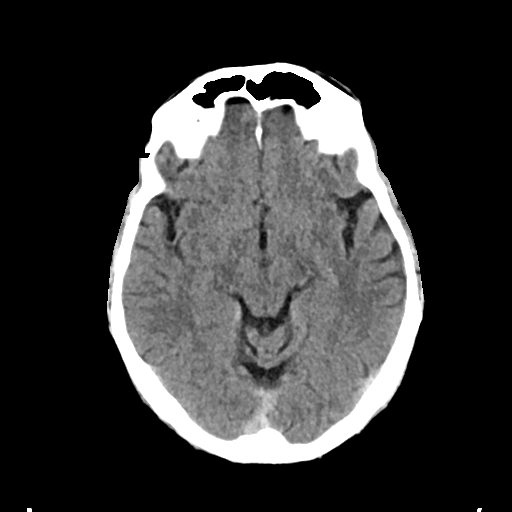
[im 17/34  brain]
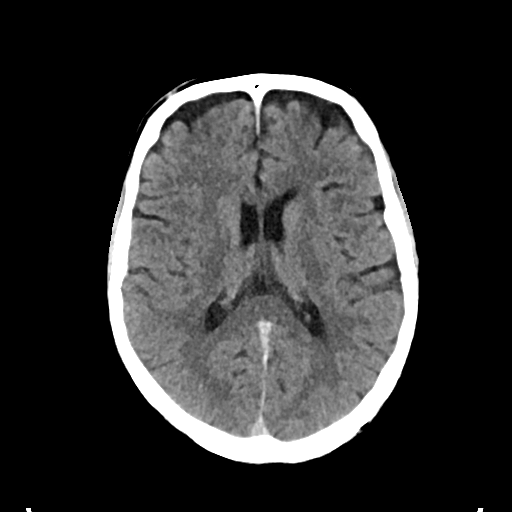
[im 21/34  brain]
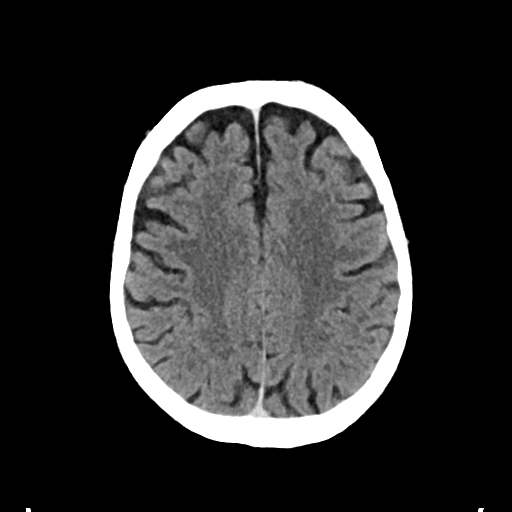
[im 21/34  bone]
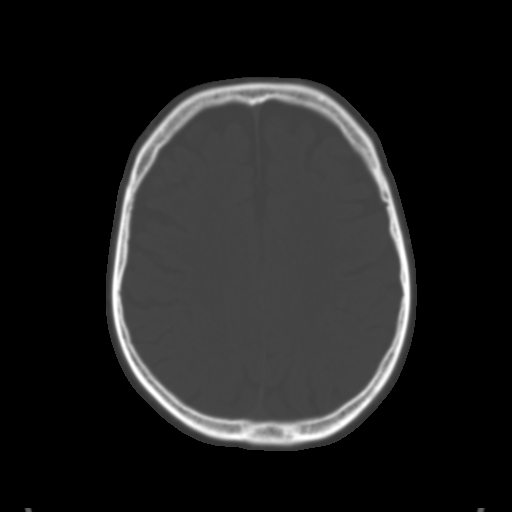
[im 25/34  brain]
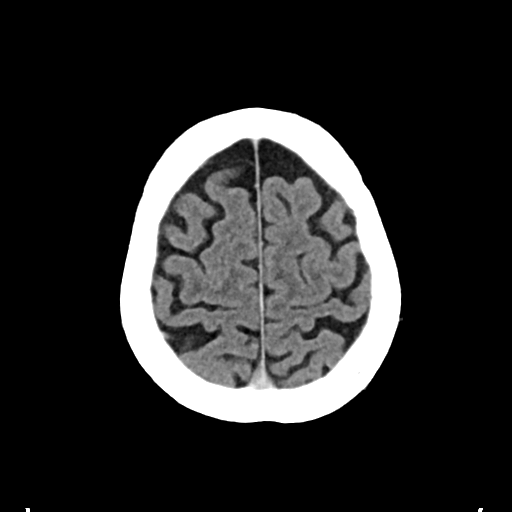
[im 29/34  brain]
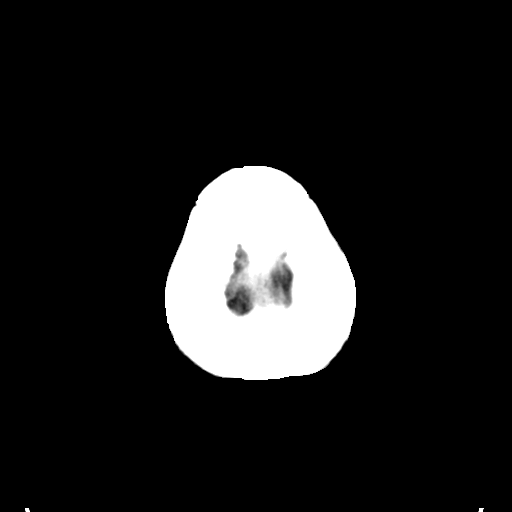

[Series 3: head bone · axial · 0.46mm/px · z∈[+1215,+1249]mm · 3 of 85 slices shown]
[im 9/85  bone]
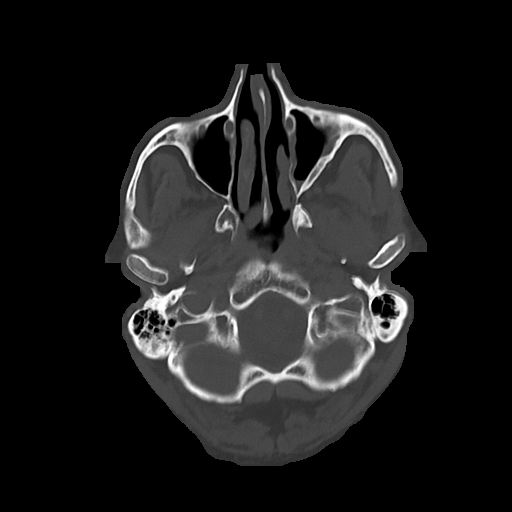
[im 17/85  bone]
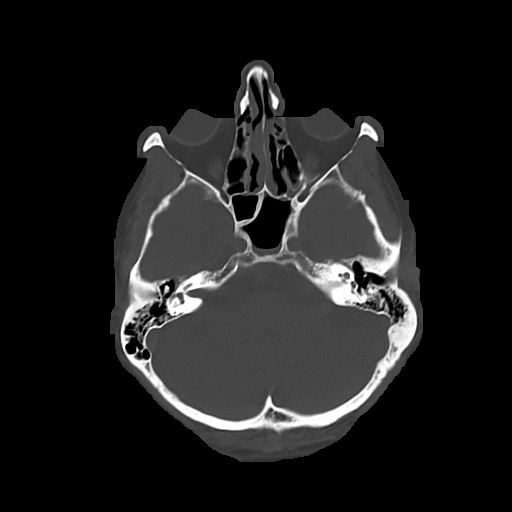
[im 26/85  bone]
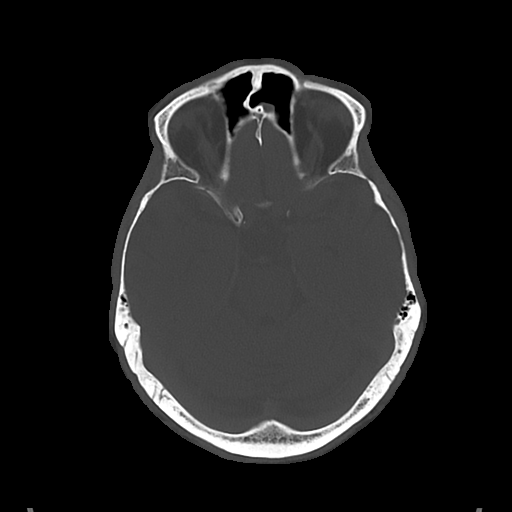

[Series 4: cor soft · coronal · 0.31mm/px · 3 of 70 slices shown]
[im 24/70  brain]
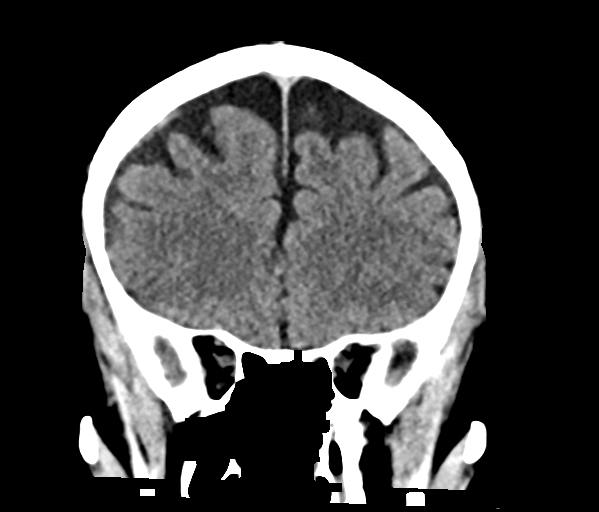
[im 31/70  brain]
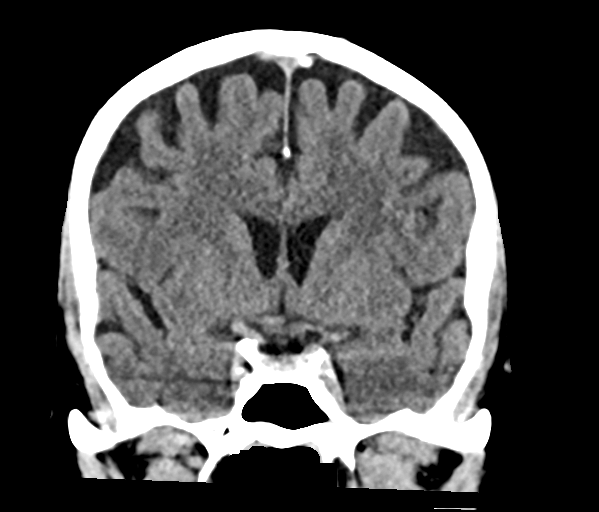
[im 39/70  brain]
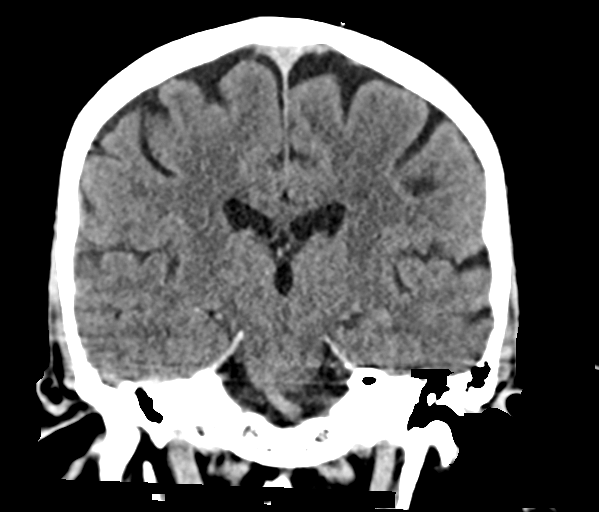

[Series 5: sag soft · sagittal · 0.32mm/px · 3 of 61 slices shown]
[im 21/61  brain]
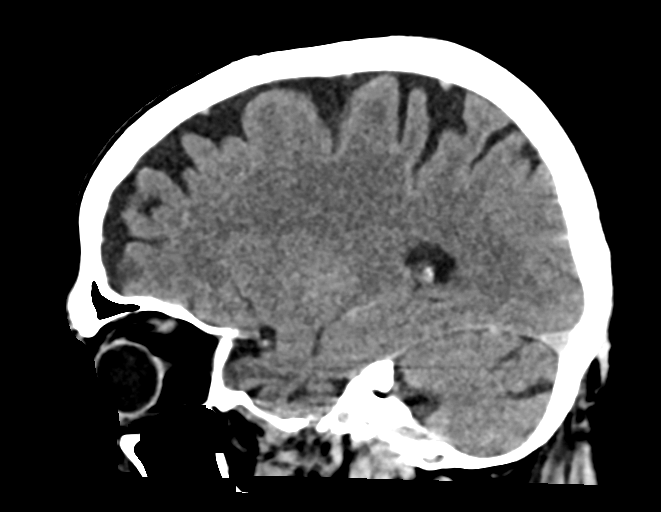
[im 31/61  brain]
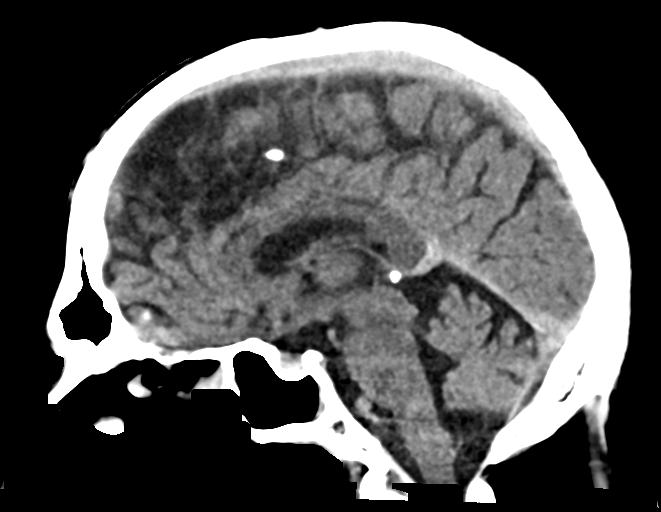
[im 41/61  brain]
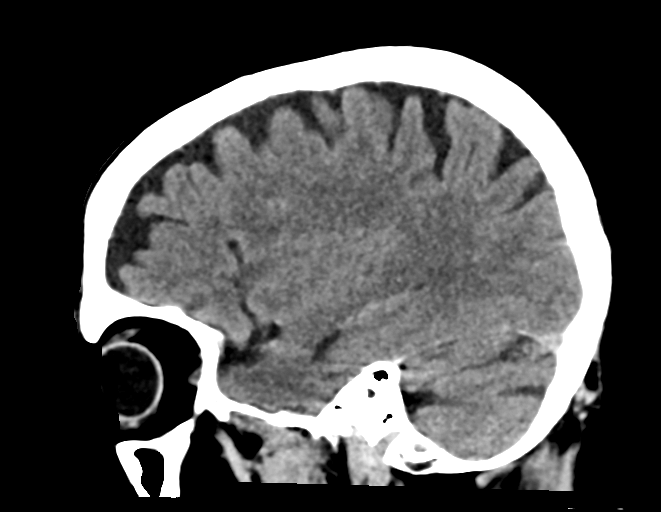

[16 of 47 positions shown; findings below may reference images not displayed]

FINDINGS: Brain: Stable cerebral volume. No acute intracranial hemorrhage. No
visible acute or evolving large vessel territory infarct. No mass
lesion or mass effect. No hydrocephalus or extra-axial fluid
collection.

Vascular: Small amount of residual contrast material present within
the intracranial circulation. No visible asymmetric hyperdense
vessel. Scattered vascular calcifications noted within the carotid
siphons.

Skull: Scalp soft tissues and calvarium are unchanged.

Sinuses/Orbits: Globes and orbital soft tissues demonstrate no new
finding. Scattered paranasal sinus disease again noted.

Other: None.
IMPRESSION: Stable head CT. No acute intracranial abnormality.

## 2021-08-27 ENCOUNTER — Ambulatory Visit (HOSPITAL_COMMUNITY)
Admission: RE | Admit: 2021-08-27 | Discharge: 2021-08-27 | Disposition: A | Discharge status: Home / Self Care | Payer: Medicare Other | Source: Ambulatory Visit | Attending: Adult Health | Admitting: Adult Health

## 2021-08-27 ENCOUNTER — Other Ambulatory Visit: Payer: Self-pay

## 2021-08-27 DIAGNOSIS — I6523 Occlusion and stenosis of bilateral carotid arteries: Secondary | ICD-10-CM | POA: Insufficient documentation

## 2021-08-27 DIAGNOSIS — I63232 Cerebral infarction due to unspecified occlusion or stenosis of left carotid arteries: Secondary | ICD-10-CM | POA: Diagnosis not present

## 2021-08-30 ENCOUNTER — Telehealth: Payer: Self-pay | Admitting: *Deleted

## 2021-08-30 ENCOUNTER — Telehealth (HOSPITAL_COMMUNITY): Payer: Self-pay

## 2021-08-30 NOTE — Telephone Encounter (Signed)
Called sister and advised patient's carotid US results show some improvement of right carotid artery narrowing currently at 40-59%, left carotid artery now shows complete occlusion. I advised she call IR to schedule f/u visit with Dr. Sherlon Handing for ongoing monitoring/management. I gave her [facility] # to IR. Advised she schedule and call his facility with appointment.  She asked if it could wait a month, and I advised it needs to be taken care of very soon. She verbalized understanding, appreciation. ? ? ? ?

## 2021-08-30 NOTE — Telephone Encounter (Signed)
Family called to get pt back in for a visit. I've sent a message to Dr. Debbrah Alar to advise. AW  ?

## 2021-08-30 NOTE — Telephone Encounter (Signed)
Called Rhonda at [facility] to schedule angiogram, no answer, left vm for her to return call. AW  ?

## 2021-09-08 ENCOUNTER — Other Ambulatory Visit: Payer: Self-pay | Admitting: Radiology

## 2021-09-09 ENCOUNTER — Other Ambulatory Visit: Payer: Self-pay

## 2021-09-09 ENCOUNTER — Ambulatory Visit (HOSPITAL_COMMUNITY)
Admission: RE | Admit: 2021-09-09 | Discharge: 2021-09-09 | Disposition: A | Discharge status: Home / Self Care | Payer: Medicare Other | Source: Ambulatory Visit | Attending: Physician Assistant | Admitting: Physician Assistant

## 2021-09-09 ENCOUNTER — Other Ambulatory Visit (HOSPITAL_COMMUNITY): Payer: Self-pay | Admitting: Physician Assistant

## 2021-09-09 DIAGNOSIS — I252 Old myocardial infarction: Secondary | ICD-10-CM | POA: Diagnosis not present

## 2021-09-09 DIAGNOSIS — E785 Hyperlipidemia, unspecified: Secondary | ICD-10-CM | POA: Diagnosis not present

## 2021-09-09 DIAGNOSIS — Z951 Presence of aortocoronary bypass graft: Secondary | ICD-10-CM | POA: Insufficient documentation

## 2021-09-09 DIAGNOSIS — J449 Chronic obstructive pulmonary disease, unspecified: Secondary | ICD-10-CM | POA: Insufficient documentation

## 2021-09-09 DIAGNOSIS — I2510 Atherosclerotic heart disease of native coronary artery without angina pectoris: Secondary | ICD-10-CM | POA: Insufficient documentation

## 2021-09-09 DIAGNOSIS — I10 Essential (primary) hypertension: Secondary | ICD-10-CM | POA: Insufficient documentation

## 2021-09-09 DIAGNOSIS — I63232 Cerebral infarction due to unspecified occlusion or stenosis of left carotid arteries: Secondary | ICD-10-CM | POA: Insufficient documentation

## 2021-09-09 DIAGNOSIS — J45909 Unspecified asthma, uncomplicated: Secondary | ICD-10-CM | POA: Diagnosis not present

## 2021-09-09 LAB — BASIC METABOLIC PANEL
Anion gap: 11 (ref 5–15)
BUN: 14 mg/dL (ref 8–23)
CO2: 29 mmol/L (ref 22–32)
Calcium: 8.8 mg/dL — ABNORMAL LOW (ref 8.9–10.3)
Chloride: 100 mmol/L (ref 98–111)
Creatinine, Ser: 0.49 mg/dL — ABNORMAL LOW (ref 0.61–1.24)
GFR, Estimated: >60 mL/min (ref >60)
Glucose, Bld: 120 mg/dL — ABNORMAL HIGH (ref 70–99)
Potassium: 3.2 mmol/L — ABNORMAL LOW (ref 3.5–5.1)
Sodium: 140 mmol/L (ref 135–145)

## 2021-09-09 LAB — CBC
HCT: 35.7 % — ABNORMAL LOW (ref 39.0–52.0)
Hemoglobin: 11.9 g/dL — ABNORMAL LOW (ref 13.0–17.0)
MCH: 31 pg (ref 26.0–34.0)
MCHC: 33.3 g/dL (ref 30.0–36.0)
MCV: 93 fL (ref 80.0–100.0)
Platelets: 205 10*3/uL (ref 150–400)
RBC: 3.84 MIL/uL — ABNORMAL LOW (ref 4.22–5.81)
RDW: 12.7 % (ref 11.5–15.5)
WBC: 4.8 10*3/uL (ref 4.0–10.5)
nRBC: 0 % (ref 0.0–0.2)

## 2021-09-09 LAB — PROTIME-INR
INR: 1 (ref 0.8–1.2)
Prothrombin Time: 13.6 seconds (ref 11.4–15.2)

## 2021-09-09 MED ORDER — IOHEXOL 300 MG/ML  SOLN: 100.0000 mL | Freq: Once | INTRAMUSCULAR | Status: DC | PRN

## 2021-09-09 MED ORDER — NITROGLYCERIN 5 MG/ML IV SOLN
INTRAVENOUS | Status: AC | PRN
  Administered 2021-09-09: 200 ug via INTRA_ARTERIAL

## 2021-09-09 MED ORDER — LIDOCAINE HCL 1 % IJ SOLN
INTRAMUSCULAR | Status: AC
  Filled 2021-09-09: qty 20

## 2021-09-09 MED ORDER — FENTANYL CITRATE (PF) 100 MCG/2ML IJ SOLN
INTRAMUSCULAR | Status: AC | PRN
Start: 2021-09-09 — End: 2021-09-09
  Administered 2021-09-09: 25 ug via INTRAVENOUS

## 2021-09-09 MED ORDER — VERAPAMIL HCL 2.5 MG/ML IV SOLN
INTRAVENOUS | Status: AC
  Filled 2021-09-09: qty 2

## 2021-09-09 MED ORDER — NITROGLYCERIN 1 MG/10 ML FOR IR/CATH LAB
INTRA_ARTERIAL | Status: AC
  Filled 2021-09-09: qty 10

## 2021-09-09 MED ORDER — FENTANYL CITRATE (PF) 100 MCG/2ML IJ SOLN
INTRAMUSCULAR | Status: AC
  Filled 2021-09-09: qty 2

## 2021-09-09 MED ORDER — VERAPAMIL HCL 2.5 MG/ML IV SOLN: INTRA_ARTERIAL | Status: AC | PRN

## 2021-09-09 MED ORDER — SODIUM CHLORIDE 0.9 % IV SOLN: Freq: Once | INTRAVENOUS | Status: DC

## 2021-09-09 MED ORDER — HEPARIN SODIUM (PORCINE) 1000 UNIT/ML IJ SOLN
INTRAMUSCULAR | Status: AC
  Filled 2021-09-09: qty 10

## 2021-09-09 MED ORDER — MIDAZOLAM HCL 2 MG/2ML IJ SOLN
INTRAMUSCULAR | Status: AC | PRN
  Administered 2021-09-09: 1 mg via INTRAVENOUS

## 2021-09-09 MED ORDER — MIDAZOLAM HCL 2 MG/2ML IJ SOLN
INTRAMUSCULAR | Status: AC
  Filled 2021-09-09: qty 2

## 2021-09-09 NOTE — Discharge Instructions (Signed)
Radial Site Care  This sheet gives you information about how to care for yourself after your procedure. Your health care provider may also give you more specific instructions. If you have problems or questions, contact your health care provider. What can I expect after the procedure? After the procedure, it is common to have: Bruising and tenderness at the catheter insertion area. Follow these instructions at home: Medicines Take over-the-counter and prescription medicines only as told by your health care provider. Insertion site care Follow instructions from your health care provider about how to take care of your insertion site. Make sure you: Wash your hands with soap and water before you remove your bandage (dressing). If soap and water are not available, use hand sanitizer. May remove dressing in 24 hours. Check your insertion site every day for signs of infection. Check for: Redness, swelling, or pain. Fluid or blood. Pus or a bad smell. Warmth. Do no take baths, swim, or use a hot tub for 5 days. You may shower 24-48 hours after the procedure. Remove the dressing and gently wash the site with plain soap and water. Pat the area dry with a clean towel. Do not rub the site. That could cause bleeding. Do not apply powder or lotion to the site. Activity  For 24 hours after the procedure, or as directed by your health care provider: Do not flex or bend the affected arm. Do not push or pull heavy objects with the affected arm. Do not drive yourself home from the hospital or clinic. You may drive 24 hours after the procedure. Do not operate machinery or power tools. KEEP ARM ELEVATED THE REMAINDER OF THE DAY. Do not push, pull or lift anything that is heavier than 10 lb for 5 days. Ask your health care provider when it is okay to: Return to work or school. Resume usual physical activities or sports. Resume sexual activity. General instructions If the catheter site starts to  bleed, raise your arm and put firm pressure on the site. If the bleeding does not stop, get help right away. This is a medical emergency. DRINK PLENTY OF FLUIDS FOR THE NEXT 2-3 DAYS. No alcohol consumption for 24 hours after receiving sedation. If you went home on the same day as your procedure, a responsible adult should be with you for the first 24 hours after you arrive home. Keep all follow-up visits as told by your health care provider. This is important. Contact a health care provider if: You have a fever. You have redness, swelling, or yellow drainage around your insertion site. Get help right away if: You have unusual pain at the radial site. The catheter insertion area swells very fast. The insertion area is bleeding, and the bleeding does not stop when you hold steady pressure on the area. Your arm or hand becomes pale, cool, tingly, or numb. These symptoms may represent a serious problem that is an emergency. Do not wait to see if the symptoms will go away. Get medical help right away. Call your local emergency services (911 in the [place] Do not drive yourself to the hospital. Summary After the procedure, it is common to have bruising and tenderness at the site. Follow instructions from your health care provider about how to take care of your radial site wound. Check the wound every day for signs of infection.  This information is not intended to replace advice given to you by your health care provider. Make sure you discuss any questions you have with   your health care provider. Document Revised: 07/19/2017 Document Reviewed: 07/19/2017 Elsevier Patient Education  2020 [employer] Inc.  

## 2021-09-09 NOTE — Sedation Documentation (Signed)
Pt transported to Short stay room 3 via bed accompanied by RN. Lattie Haw RN at bedside to receive pt. Right radial site level 0 with snuff box in place. No s/s of distress at this time. Pt awake and responds appropriately.  ?

## 2021-09-09 NOTE — Progress Notes (Signed)
Facility asked to send last doses of meds. Awaiting response. ?

## 2021-09-09 NOTE — H&P (Signed)
? ?Chief Complaint: ?Patient was seen in consultation today for diagnostic cerebral angiogram.  ? ?Referring Physician(s): ?Watterson,Shannon A ? ?Supervising Physician: Pedro Earls ? ?Patient Status: [facility] - Out-pt ? ?History of Present Illness: ?Brandon Daniel is a 71 y.o. male with a medical history significant for CAD s/p CABG, HTN, and asthma who initially presented to the ED 12/03/20 with worsening shortness of breath and chest pain after returning from an international trip. He was admitted for NSTEMI and Influenza A. Shortly after admission he developed mild right facial droop and right-sided weakness. A Code Stroke was initiated and the patient received tPA. He was diagnosed with a left MCA stroke secondary to symptomatic high-grade proximal left carotid artery stenosis. He unfortunately developed worsening mental status alterations with respiratory distress and he was intubated 12/04/20. ? ?Neuro Interventional Radiology was consulted for a possible diagnostic cerebral angiogram but due to the patient's overall instability it was recommended to perform this procedure as an outpatient unless the patient developed any new or worsening neurological symptoms. Of note, the patient was diagnosed with a right lower extremity DVT during his hospitalization.  ? ?The patient was ultimately discharged to a skilled nursing facility 01/07/21. He was discharged on Eliquis and 81 mg Aspirin. Multiple attempts were made by IR schedulers to get the patient scheduled for a diagnostic cerebral angiogram. The family called IR 08/30/21 and requested for this procedure to be scheduled.  ? ?Past Medical History:  ?Diagnosis Date  ? Asthma-COPD overlap syndrome ([place])   ? CAD (coronary artery disease)   ? COPD (chronic obstructive pulmonary disease) ([place])   ? HLD (hyperlipidemia)   ? HTN (hypertension)   ? Lumbar disc herniation with radiculopathy   ? Lumbar stenosis   ? Osteoporosis   ? Stroke [facility])   ? ? ?Past  Surgical History:  ?Procedure Laterality Date  ? CATARACT EXTRACTION, BILATERAL  2021  ? CORONARY ARTERY BYPASS GRAFT  2005  ? [place]  ? LEFT HEART CATH AND CORS/GRAFTS ANGIOGRAPHY N/A 12/08/2020  ? Procedure: LEFT HEART CATH AND CORS/GRAFTS ANGIOGRAPHY;  Surgeon: Dixie Dials, MD;  Location: [place] CV LAB;  Service: Cardiovascular;  Laterality: N/A;  ? LUMBAR [place] SURGERY  2019  ? ? ?Allergies: ?Patient has no known allergies. ? ?Medications: ?Prior to Admission medications   ?Medication Sig Start Date End Date Taking? Authorizing Provider  ?alendronate (FOSAMAX) 70 MG tablet Take 70 mg by mouth once a week. Take with a full glass of water on an empty stomach.   Yes [provider]  ?apixaban (ELIQUIS) 5 MG TABS tablet Take 1 tablet (5 mg total) by mouth 2 (two) times daily. 01/07/21  Yes Mercy Riding, MD  ?arformoterol (BROVANA) 15 MCG/2ML NEBU Take 2 mLs (15 mcg total) by nebulization 2 (two) times daily. 01/07/21  Yes Mercy Riding, MD  ?aspirin EC 81 MG tablet Take 81 mg by mouth in the morning and at bedtime. Swallow whole.   Yes [provider]  ?atorvastatin (LIPITOR) 80 MG tablet Take 80 mg by mouth daily at 6 PM.   Yes [provider]  ?budesonide (PULMICORT) 0.5 MG/2ML nebulizer solution Take 2 mLs (0.5 mg total) by nebulization 2 (two) times daily. 01/07/21  Yes Mercy Riding, MD  ?carboxymethylcellulose (REFRESH PLUS) 0.5 % SOLN Place 1 drop into both eyes daily.   Yes [provider]  ?Cholecalciferol (VITAMIN D) 50 MCG (2000 UT) tablet Take 2,000 Units by mouth daily.  Yes [provider]  ?docusate sodium (COLACE) 100 MG capsule Take 1 capsule (100 mg total) by mouth daily. 01/08/21  Yes Mercy Riding, MD  ?ezetimibe (ZETIA) 10 MG tablet Take 10 mg by mouth daily.   Yes [provider]  ?ferrous sulfate 325 (65 FE) MG tablet Take 1 tablet (325 mg total) by mouth 2 (two) times daily with a meal. 01/07/21  Yes Mercy Riding,  MD  ?FLUoxetine (PROZAC) 40 MG capsule Take 40 mg by mouth daily.   Yes [provider]  ?gabapentin (NEURONTIN) 300 MG capsule Take 1 capsule (300 mg total) by mouth 2 (two) times daily. 01/07/21  Yes Mercy Riding, MD  ?Infant Care Products [facility]) OINT Apply 1 application. topically See admin instructions. Apply to buttocks two times per day and after incontinent episodes   Yes [provider]  ?meclizine (ANTIVERT) 25 MG tablet Take 25 mg by mouth See admin instructions. Take 25 mg 1 hour prior to PT/OT or transfer   Yes [provider]  ?metFORMIN (GLUCOPHAGE) 500 MG tablet Take 1 tablet (500 mg total) by mouth 2 (two) times daily with a meal. 01/07/21 01/07/22 Yes Mercy Riding, MD  ?metoprolol tartrate (LOPRESSOR) 50 MG tablet Take 50 mg by mouth 2 (two) times daily. 03/16/21  Yes [provider]  ?montelukast (SINGULAIR) 10 MG tablet Take 10 mg by mouth at bedtime.   Yes [provider]  ?nitroGLYCERIN (NITROSTAT) 0.4 MG SL tablet Place 0.4 mg under the tongue every 5 (five) minutes as needed for chest pain.   Yes [provider]  ?ondansetron (ZOFRAN) 4 MG tablet Take 1 tablet (4 mg total) by mouth 2 (two) times daily before a meal. ?Patient taking differently: Take 4 mg by mouth every 8 (eight) hours as needed for vomiting or nausea. 05/26/21  Yes Zehr, Laban Emperor, PA-C  ?oxybutynin (DITROPAN) 5 MG tablet Take 2.5 mg by mouth 2 (two) times daily.   Yes [provider]  ?pantoprazole (PROTONIX) 40 MG tablet Take 1 tablet (40 mg total) by mouth 2 (two) times daily before a meal. 05/26/21  Yes Zehr, Janett Billow D, PA-C  ?scopolamine (TRANSDERM-SCOP) 1 MG/3DAYS Place 1 patch onto the skin every 3 (three) days.   Yes [provider]  ?tamsulosin (FLOMAX) 0.4 MG CAPS capsule Take 1 capsule (0.4 mg total) by mouth daily after supper. 01/07/21  Yes Mercy Riding, MD  ?traMADol (ULTRAM) 50 MG tablet Take 50 mg by mouth every 6 (six) hours as needed  for moderate pain.   Yes [provider]  ?OXYGEN Inhale into the lungs. As needed    [provider]  ?  ? ?Family History  ?Problem Relation Age of Onset  ? Cancer Mother   ? CAD Father   ? Diabetes Other   ? Colon cancer Other   ? Prostate cancer Other   ? ? ?Social History  ? ?Socioeconomic History  ? Marital status: Divorced  ?  Spouse name: Not on file  ? Number of children: Not on file  ? Years of education: Not on file  ? Highest education level: Not on file  ?Occupational History  ? Not on file  ?Tobacco Use  ? Smoking status: Former  ?  Packs/day: 2.00  ?  Years: 30.00  ?  Pack years: 60.00  ?  Types: Cigarettes  ? Smokeless tobacco: Never  ?Substance and Sexual Activity  ? Alcohol use: Never  ? Drug use: Never  ?  Sexual activity: Not on file  ?Other Topics Concern  ? Not on file  ?Social History Narrative  ? 02/24/21 living at [facility]  ? ?Social Determinants of Health  ? ?Financial Resource Strain: Not on file  ?Food Insecurity: Not on file  ?Transportation Needs: Not on file  ?Physical Activity: Not on file  ?Stress: Not on file  ?Social Connections: Not on file  ? ? ?Review of Systems: A 12 point ROS discussed and pertinent positives are indicated in the HPI above.  All other systems are negative. ? ?Review of Systems  ?Constitutional:  Positive for appetite change.  ?Neurological:  Positive for speech difficulty.  ? ?Vital Signs: ?BP (!) 143/73   Pulse 64   Temp 98.3 ?F (36.8 ?C) (Oral)   Resp 16   Ht 6' (1.829 m)   Wt 150 lb (68 kg)   SpO2 94%   BMI 20.34 kg/m?  ? ?Physical Exam ?Constitutional:   ?   General: He is not in acute distress. ?   Appearance: He is not ill-appearing.  ?HENT:  ?   Mouth/Throat:  ?   Comments: Thrush - patient being treated for this at SNF.  ?Cardiovascular:  ?   Rate and Rhythm: Normal rate and regular rhythm.  ?   Pulses: Normal pulses.  ?   Heart sounds: Normal heart sounds.  ?Pulmonary:  ?   Effort: Pulmonary effort is normal.  ?   Breath  sounds: Normal breath sounds.  ?Abdominal:  ?   General: Bowel sounds are normal.  ?   Palpations: Abdomen is soft.  ?   Tenderness: There is no abdominal tenderness.  ?Genitourinary: ?   Comments: Chronic indwe

## 2021-09-09 NOTE — Progress Notes (Signed)
Report called and given to [facility] (nurse) at [facility].  ?

## 2021-09-09 NOTE — Procedures (Addendum)
INTERVENTIONAL NEURORADIOLOGY BRIEF POSTPROCEDURE NOTE ? ?DIAGNOSTIC CEREBRAL ANGIOGRAM  ?  ?Attending: Dr. Pedro Earls ?  ?Assistant: None.  ?  ?Diagnosis: Bilateral carotid disease.  ?  ?Access site: Distal right radial artery.  ?  ?Access closure: Inflatable band.  ?  ?Anesthesia: Moderate sedation.  ?  ?Medication used: 1 mg Versed IV; 25 mcg Fentanyl IV. ?  ?Complications: None.  ?  ?Estimated blood loss: Negligible.  ?  ?Specimen: None.  ?  ?Findings:  ?Occlusion of the left ICA at its origin. ?The prominent atherosclerotic disease of the right carotid bifurcation resulting in approximately 70% stenosis of the right ICA.  ?Faint contrast opacification of the left anterior circulation with right carotid contrast injection via right to left anastomosis of the hypophyseal branches of the meningohypophyseal trunks.  ?Left supply to left anterior circulation is also seen from the posterior circulation via posterior communicating artery and left PCA to left MCA leptomeningeal collaterals.  ?  ?The patient tolerated the procedure well without incident or complication and is in stable condition. ?  ?Findings were discussed with patient's sister.  She well discuss findings with the family to decide on possible right carotid angioplasty and stenting.  ? ?  ?

## 2021-09-29 ENCOUNTER — Other Ambulatory Visit (HOSPITAL_COMMUNITY): Payer: Self-pay | Admitting: Neuroradiology

## 2021-09-29 DIAGNOSIS — I771 Stricture of artery: Secondary | ICD-10-CM

## 2021-10-13 NOTE — Progress Notes (Signed)
Updated instructions faxed to [facility]. Awaiting confirmation. ? ? ?  ?

## 2021-10-13 NOTE — Progress Notes (Signed)
Messaged Dr. Karenann Cai regarding Brandon Daniel and Plavix instructions. She states the pt should take Asa and Plavix normally and on the day of the procedure. Will relay to the [facility] nurse. ?

## 2021-10-13 NOTE — Progress Notes (Signed)
Nurse from [facility] called back to confirm receipt of all pre-op instructions including the update on Asa and Plavix. ?

## 2021-10-13 NOTE — Anesthesia Preprocedure Evaluation (Addendum)
Anesthesia Evaluation  ?Patient identified by MRN, date of birth, ID band ?Patient awake ? ? ? ?Reviewed: ?Allergy & Precautions, NPO status , Patient's Chart, lab work & pertinent test results ? ?History of Anesthesia Complications ?Negative for: history of anesthetic complications ? ?Airway ?Mallampati: I ? ?TM Distance: >3 FB ?Neck ROM: Full ? ? ? Dental ? ?(+) Dental Advisory Given, Teeth Intact ?  ?Pulmonary ?asthma , COPD,  oxygen dependent, former smoker,  ?  ?Pulmonary exam normal ? ? ? ? ? ? ? Cardiovascular ?hypertension, Pt. on medications and Pt. on home beta blockers ?+ CAD, + Past MI, + Cardiac Stents, + CABG and + DVT  ?Normal cardiovascular exam ? ? ?LHC 12/08/20: severe multivessel CAD with multiple chronic total or near total occlusions - medical management ? ?Echo 12/04/20: EF 55-60%, no RWMA, normal RVSF, valves unremarkable ?  ?Neuro/Psych ?CVA (June 2022, secondary to high grade L carotid artery stenosis), Residual Symptoms   ? GI/Hepatic ?negative GI ROS, Neg liver ROS,   ?Endo/Other  ?negative endocrine ROS ? Renal/GU ?negative Renal ROS  ?negative genitourinary ?  ?Musculoskeletal ?negative musculoskeletal ROS ?(+)  ? Abdominal ?  ?Peds ? Hematology ?Plavix   ?Anesthesia Other Findings ? ? Reproductive/Obstetrics ? ?  ? ? ? ? ? ? ? ? ? ? ? ? ? ?  ?  ? ? ? ? ? ? ? ?Anesthesia Physical ?Anesthesia Plan ? ?ASA: 4 ? ?Anesthesia Plan: MAC  ? ?Post-op Pain Management: Minimal or no pain anticipated  ? ?Induction: Intravenous ? ?PONV Risk Score and Plan: 1 and Treatment may vary due to age or medical condition ? ?Airway Management Planned: Natural Airway, Nasal Cannula and Simple Face Mask ? ?Additional Equipment: None ? ?Intra-op Plan:  ? ?Post-operative Plan:  ? ?Informed Consent: I have reviewed the patients History and Physical, chart, labs and discussed the procedure including the risks, benefits and alternatives for the proposed anesthesia with the patient or  authorized representative who has indicated his/her understanding and acceptance.  ? ? ? ?Dental advisory given and Consent reviewed with POA ? ?Plan Discussed with:  ? ?Anesthesia Plan Comments: (Consent reviewed with both patient and sister due to severe expressive aphasia. )  ? ? ? ? ?Anesthesia Quick Evaluation ? ?

## 2021-10-13 NOTE — Pre-Procedure Instructions (Signed)
? ? [employer] ? 10/13/2021  ?  ? Surgical Instructions ? ? Your procedure is scheduled on Thurs., October 14, 2021 from 8:30AM-11:30AM. ? Report to [facility] Main Entrance "A" at 6:00 A.M., then check in with the Admitting office. ? Call this number if you have problems the morning of surgery: ? [phone] ? ? Remember: ? Do not eat or drink after midnight the night on 10/13/21 ?  ? Take these medicines the morning of surgery with A SIP OF WATER: ?Please provide the exact times the medications were taken. Thank you. ?Arformoterol (BROVANA) ?Budesonide (PULMICORT) ?Carboxymethylcellulose (REFRESH PLUS) ?Ezetimibe (ZETIA) ?FLUoxetine (PROZAC) ?Gabapentin (NEURONTIN) ?Meclizine (ANTIVERT) ?Metoprolol tartrate (LOPRESSOR) ?Oxybutynin (DITROPAN) ?Pantoprazole (PROTONIX) ?Scopolamine (TRANSDERM-SCOP) ? ?If Needed: ?NitroGLYCERIN (NITROSTAT) ?Ondansetron [facility]) ?OXYGEN ?TraMADol Janean Sark) ? ?Follow your surgeon's instructions on when to stop Aspirin and Plavix.  If no instructions were given by your surgeon then you will need to call the office to get those instructions.   ? ?As of today, STOP taking any Aspirin (unless otherwise instructed by your surgeon) Aleve, Naproxen, Ibuprofen, Motrin, Advil, Goody's, BC's, all herbal medications, fish oil, and all vitamins. ? ? ?WHAT DO I DO ABOUT MY DIABETES MEDICATION? ? ?Do not take MetFORMIN (GLUCOPHAGE) the morning of surgery. ? ?If your CBG is greater than 220 mg/dL, inform the staff upon arrival to Short Stay. ? ?How do I manage my blood sugar before surgery? ?Check your blood sugar the morning of your surgery when you wake up and every 2 hours until you get to the Short Stay unit. ?If your blood sugar is less than 70 mg/dL, you will need to treat for low blood sugar: ?Do not take insulin. ?Treat a low blood sugar (less than 70 mg/dL) with ? cup of clear juice (cranberry or apple), 4 glucose tablets, OR glucose gel. ?Recheck blood sugar in 15 minutes after treatment (to  make sure it is greater than 70 mg/dL). If your blood sugar is not greater than 70 mg/dL on recheck, call [phone] ? for further instructions. ?Report your blood sugar to the short stay nurse when you get to Short Stay. ? ?Reviewed and Endorsed by [facility] Patient Education Committee, August 2015 ? ?           Day of Surgery: ?Do not wear jewelry. ?Do not wear lotions, powders, perfumes/colognes, or deodorant. ?Do not shave 48 hours prior to surgery.  Men may shave face and neck. ?Do not bring valuables to the hospital. ? ?           [facility] is not responsible for any belongings or valuables. ? ?Do NOT Smoke (Tobacco/Vaping)  24 hours prior to your procedure ? ?If you use a CPAP at night, you may bring your mask and machine for your overnight stay. ?  ?Contacts, glasses, hearing aids, dentures or partials may not be worn into surgery, please bring cases for these belongings ?  ?For patients admitted to the hospital, discharge time will be determined by your treatment team. ?  ?Patients discharged the day of surgery will not be allowed to drive home, and someone needs to stay with them for 24 hours. ? ?NO VISITORS WILL BE ALLOWED IN PRE-OP WHERE PATIENTS ARE PREPPED FOR SURGERY.   ? ?SURGICAL WAITING ROOM VISITATION ?Patients having surgery or a procedure in a hospital may have two support people. ?Children under the age of 55 must have an adult with them who is not the patient. ?They may stay in the waiting  area during the procedure and may switch out with other visitors. If the patient needs to stay at the hospital during part of their recovery, the visitor guidelines for inpatient rooms apply. ? ?Please refer to the [facility] website for the visitor guidelines for Inpatients (after your surgery is over and you are in a regular room).  ? ?Special instructions:   ? ?Oral Hygiene is also important to reduce your risk of infection.  Remember - BRUSH YOUR TEETH THE MORNING OF SURGERY WITH YOUR REGULAR  TOOTHPASTE ? ?[facility]- Preparing For Surgery ? ?Before surgery, you can play an important role. Because skin is not sterile, your skin needs to be as free of germs as possible. You can reduce the number of germs on your skin by washing with an Antibacterial/Regular Soap   ? ?Please follow these instructions carefully. ?  ? Shower the NIGHT BEFORE SURGERY and/or the MORNING OF SURGERY with an Antibacterial/Regular Soap.  ?  ?Reminder: ?Wear Clean/Comfortable clothing the morning of surgery ?Do not apply any deodorants/lotions.   ?Remember to brush your teeth WITH YOUR REGULAR TOOTHPASTE. ? ? ?If you received a COVID test during your pre-op visit  it is requested that you wear a mask when out in public, stay away from anyone that may not be feeling well and notify your surgeon if you develop symptoms. If you have been in contact with anyone that has tested positive in the last 10 days please notify you surgeon. ? ?Notify your provider: ? if you develop a fever of 100.4 or greater, sneezing, cough, sore throat, shortness of breath or body aches. ? ?Please read over the following fact sheets that you were given.  ?  ? ? ?  ?

## 2021-10-13 NOTE — Pre-Procedure Instructions (Signed)
? ?   Brandon Daniel ? 10/13/2021  ?  ? Please have the patient continue taking Aspirin and Plavix on the morning of the procedure 10/14/21. Per, Dr. Tommie Sams. ?  ? ? ?  ?

## 2021-10-13 NOTE — Progress Notes (Addendum)
Pre-op instructions faxed to [facility], nurse at [facility] [phone], fax [phone]. She denies any covid symptoms for this pt. She also states that the Eliquis-LD- 4/12, Asa- LD- 4/18, and Plavix- LD- 4/18. Awaiting confirmation of receipt of instructions. ? ? ?  ?

## 2021-10-14 ENCOUNTER — Encounter (HOSPITAL_COMMUNITY): Payer: Self-pay

## 2021-10-14 ENCOUNTER — Inpatient Hospital Stay (HOSPITAL_COMMUNITY): Payer: Medicare Other | Admitting: Anesthesiology

## 2021-10-14 ENCOUNTER — Encounter (HOSPITAL_COMMUNITY)
Admission: RE | Disposition: A | Discharge status: Skilled Nursing Facility | Payer: Self-pay | Source: Skilled Nursing Facility | Attending: Neuroradiology

## 2021-10-14 ENCOUNTER — Inpatient Hospital Stay (HOSPITAL_COMMUNITY)
Admission: RE | Admit: 2021-10-14 | Discharge: 2021-10-14 | Disposition: A | Discharge status: Home / Self Care | Payer: Medicare Other | Source: Ambulatory Visit | Attending: Neuroradiology | Admitting: Neuroradiology

## 2021-10-14 ENCOUNTER — Other Ambulatory Visit: Payer: Self-pay | Admitting: Student

## 2021-10-14 ENCOUNTER — Other Ambulatory Visit: Payer: Self-pay

## 2021-10-14 ENCOUNTER — Inpatient Hospital Stay (HOSPITAL_COMMUNITY)
Admission: RE | Admit: 2021-10-14 | Discharge: 2021-10-15 | DRG: 035 | Disposition: A | Discharge status: Skilled Nursing Facility | Payer: Medicare Other | Source: Skilled Nursing Facility | Attending: Neuroradiology | Admitting: Neuroradiology

## 2021-10-14 DIAGNOSIS — E785 Hyperlipidemia, unspecified: Secondary | ICD-10-CM | POA: Diagnosis present

## 2021-10-14 DIAGNOSIS — J9611 Chronic respiratory failure with hypoxia: Secondary | ICD-10-CM | POA: Diagnosis present

## 2021-10-14 DIAGNOSIS — Z79899 Other long term (current) drug therapy: Secondary | ICD-10-CM | POA: Diagnosis not present

## 2021-10-14 DIAGNOSIS — Z8249 Family history of ischemic heart disease and other diseases of the circulatory system: Secondary | ICD-10-CM | POA: Diagnosis not present

## 2021-10-14 DIAGNOSIS — Z8673 Personal history of transient ischemic attack (TIA), and cerebral infarction without residual deficits: Secondary | ICD-10-CM | POA: Diagnosis not present

## 2021-10-14 DIAGNOSIS — Z87891 Personal history of nicotine dependence: Secondary | ICD-10-CM

## 2021-10-14 DIAGNOSIS — Z951 Presence of aortocoronary bypass graft: Secondary | ICD-10-CM | POA: Diagnosis not present

## 2021-10-14 DIAGNOSIS — Z7902 Long term (current) use of antithrombotics/antiplatelets: Secondary | ICD-10-CM | POA: Diagnosis not present

## 2021-10-14 DIAGNOSIS — I10 Essential (primary) hypertension: Secondary | ICD-10-CM | POA: Diagnosis present

## 2021-10-14 DIAGNOSIS — Z7951 Long term (current) use of inhaled steroids: Secondary | ICD-10-CM

## 2021-10-14 DIAGNOSIS — I6521 Occlusion and stenosis of right carotid artery: Principal | ICD-10-CM | POA: Diagnosis present

## 2021-10-14 DIAGNOSIS — Z8679 Personal history of other diseases of the circulatory system: Principal | ICD-10-CM

## 2021-10-14 DIAGNOSIS — Z7983 Long term (current) use of bisphosphonates: Secondary | ICD-10-CM | POA: Diagnosis not present

## 2021-10-14 DIAGNOSIS — Z7984 Long term (current) use of oral hypoglycemic drugs: Secondary | ICD-10-CM | POA: Diagnosis not present

## 2021-10-14 DIAGNOSIS — Z9889 Other specified postprocedural states: Secondary | ICD-10-CM

## 2021-10-14 DIAGNOSIS — I771 Stricture of artery: Secondary | ICD-10-CM

## 2021-10-14 DIAGNOSIS — Z9981 Dependence on supplemental oxygen: Secondary | ICD-10-CM | POA: Diagnosis not present

## 2021-10-14 DIAGNOSIS — I2510 Atherosclerotic heart disease of native coronary artery without angina pectoris: Secondary | ICD-10-CM | POA: Diagnosis present

## 2021-10-14 DIAGNOSIS — M810 Age-related osteoporosis without current pathological fracture: Secondary | ICD-10-CM | POA: Diagnosis present

## 2021-10-14 DIAGNOSIS — Z79891 Long term (current) use of opiate analgesic: Secondary | ICD-10-CM | POA: Diagnosis not present

## 2021-10-14 DIAGNOSIS — J449 Chronic obstructive pulmonary disease, unspecified: Secondary | ICD-10-CM | POA: Diagnosis present

## 2021-10-14 DIAGNOSIS — Z006 Encounter for examination for normal comparison and control in clinical research program: Secondary | ICD-10-CM | POA: Diagnosis not present

## 2021-10-14 HISTORY — PX: RADIOLOGY WITH ANESTHESIA: SHX6223

## 2021-10-14 HISTORY — PX: IR US GUIDE VASC ACCESS RIGHT: IMG2390

## 2021-10-14 HISTORY — PX: IR INTRAVSC STENT CERV CAROTID W/EMB-PROT MOD SED INCL ANGIO: IMG2303

## 2021-10-14 LAB — CBC WITH DIFFERENTIAL/PLATELET
Abs Immature Granulocytes: 0.02 10*3/uL (ref 0.00–0.07)
Basophils Absolute: 0 10*3/uL (ref 0.0–0.1)
Basophils Relative: 1 %
Eosinophils Absolute: 0.2 10*3/uL (ref 0.0–0.5)
Eosinophils Relative: 4 %
HCT: 37.9 % — ABNORMAL LOW (ref 39.0–52.0)
Hemoglobin: 12.2 g/dL — ABNORMAL LOW (ref 13.0–17.0)
Immature Granulocytes: 0 %
Lymphocytes Relative: 30 %
Lymphs Abs: 1.4 10*3/uL (ref 0.7–4.0)
MCH: 30.4 pg (ref 26.0–34.0)
MCHC: 32.2 g/dL (ref 30.0–36.0)
MCV: 94.5 fL (ref 80.0–100.0)
Monocytes Absolute: 0.5 10*3/uL (ref 0.1–1.0)
Monocytes Relative: 11 %
Neutro Abs: 2.5 10*3/uL (ref 1.7–7.7)
Neutrophils Relative %: 54 %
Platelets: 172 10*3/uL (ref 150–400)
RBC: 4.01 MIL/uL — ABNORMAL LOW (ref 4.22–5.81)
RDW: 13 % (ref 11.5–15.5)
WBC: 4.6 10*3/uL (ref 4.0–10.5)
nRBC: 0 % (ref 0.0–0.2)

## 2021-10-14 LAB — PROTIME-INR
INR: 1 (ref 0.8–1.2)
Prothrombin Time: 13.1 seconds (ref 11.4–15.2)

## 2021-10-14 LAB — GLUCOSE, CAPILLARY: Glucose-Capillary: 134 mg/dL — ABNORMAL HIGH (ref 70–99)

## 2021-10-14 LAB — MRSA NEXT GEN BY PCR, NASAL: MRSA by PCR Next Gen: DETECTED — AB

## 2021-10-14 SURGERY — IR WITH ANESTHESIA
Anesthesia: Monitor Anesthesia Care

## 2021-10-14 MED ORDER — ONDANSETRON HCL 4 MG/2ML IJ SOLN: 4.0000 mg | Freq: Four times a day (QID) | INTRAMUSCULAR | Status: DC | PRN

## 2021-10-14 MED ORDER — MUPIROCIN 2 % EX OINT
1.0000 "application " | TOPICAL_OINTMENT | Freq: Two times a day (BID) | CUTANEOUS | Status: DC
  Administered 2021-10-14 – 2021-10-15 (×2): 1 via NASAL
  Filled 2021-10-14: qty 22

## 2021-10-14 MED ORDER — EPHEDRINE SULFATE-NACL 50-0.9 MG/10ML-% IV SOSY
PREFILLED_SYRINGE | INTRAVENOUS | Status: DC | PRN
  Administered 2021-10-14: 5 mg via INTRAVENOUS

## 2021-10-14 MED ORDER — MIDAZOLAM HCL 2 MG/2ML IJ SOLN
INTRAMUSCULAR | Status: AC
  Filled 2021-10-14: qty 2

## 2021-10-14 MED ORDER — CLEVIDIPINE BUTYRATE 0.5 MG/ML IV EMUL: 0.0000 mg/h | INTRAVENOUS | Status: DC

## 2021-10-14 MED ORDER — AMISULPRIDE (ANTIEMETIC) 5 MG/2ML IV SOLN: 10.0000 mg | Freq: Once | INTRAVENOUS | Status: DC | PRN

## 2021-10-14 MED ORDER — ONDANSETRON HCL 4 MG/2ML IJ SOLN
INTRAMUSCULAR | Status: DC | PRN
  Administered 2021-10-14: 4 mg via INTRAVENOUS

## 2021-10-14 MED ORDER — ASPIRIN 325 MG PO TABS: 325.0000 mg | ORAL_TABLET | Freq: Every day | ORAL | Status: DC

## 2021-10-14 MED ORDER — FENTANYL CITRATE (PF) 100 MCG/2ML IJ SOLN
INTRAMUSCULAR | Status: DC | PRN
Start: 2021-10-14 — End: 2021-10-14
  Administered 2021-10-14: 25 ug via INTRAVENOUS

## 2021-10-14 MED ORDER — LIDOCAINE HCL 1 % IJ SOLN
INTRAMUSCULAR | Status: AC
  Filled 2021-10-14: qty 20

## 2021-10-14 MED ORDER — CLOPIDOGREL BISULFATE 75 MG PO TABS
75.0000 mg | ORAL_TABLET | Freq: Every day | ORAL | Status: DC
  Administered 2021-10-15: 75 mg via ORAL
  Filled 2021-10-14: qty 1

## 2021-10-14 MED ORDER — OXYCODONE HCL 5 MG PO TABS: 5.0000 mg | ORAL_TABLET | Freq: Once | ORAL | Status: DC | PRN

## 2021-10-14 MED ORDER — OXYCODONE HCL 5 MG/5ML PO SOLN: 5.0000 mg | Freq: Once | ORAL | Status: DC | PRN

## 2021-10-14 MED ORDER — IOHEXOL 300 MG/ML  SOLN
100.0000 mL | Freq: Once | INTRAMUSCULAR | Status: AC | PRN
  Administered 2021-10-14: 40 mL via INTRA_ARTERIAL

## 2021-10-14 MED ORDER — SODIUM CHLORIDE 0.9 % IV SOLN: INTRAVENOUS | Status: DC | PRN

## 2021-10-14 MED ORDER — FENTANYL CITRATE (PF) 100 MCG/2ML IJ SOLN: 25.0000 ug | INTRAMUSCULAR | Status: DC | PRN

## 2021-10-14 MED ORDER — CHLORHEXIDINE GLUCONATE CLOTH 2 % EX PADS: 6.0000 | MEDICATED_PAD | Freq: Every day | CUTANEOUS | Status: DC

## 2021-10-14 MED ORDER — GLYCOPYRROLATE PF 0.2 MG/ML IJ SOSY
PREFILLED_SYRINGE | INTRAMUSCULAR | Status: DC | PRN
  Administered 2021-10-14: .2 mg via INTRAVENOUS

## 2021-10-14 MED ORDER — ORAL CARE MOUTH RINSE: 15.0000 mL | Freq: Once | OROMUCOSAL | Status: AC

## 2021-10-14 MED ORDER — ASPIRIN 81 MG PO CHEW: 324.0000 mg | CHEWABLE_TABLET | Freq: Every day | ORAL | Status: DC

## 2021-10-14 MED ORDER — PROPOFOL 500 MG/50ML IV EMUL
INTRAVENOUS | Status: DC | PRN
  Administered 2021-10-14: 30 mg via INTRAVENOUS

## 2021-10-14 MED ORDER — HEPARIN SODIUM (PORCINE) 1000 UNIT/ML IJ SOLN
INTRAMUSCULAR | Status: DC | PRN
  Administered 2021-10-14: 5000 [IU] via INTRAVENOUS

## 2021-10-14 MED ORDER — ACETAMINOPHEN 650 MG RE SUPP: 650.0000 mg | RECTAL | Status: DC | PRN

## 2021-10-14 MED ORDER — ACETAMINOPHEN 325 MG PO TABS: 650.0000 mg | ORAL_TABLET | ORAL | Status: DC | PRN

## 2021-10-14 MED ORDER — CHLORHEXIDINE GLUCONATE 0.12 % MT SOLN
15.0000 mL | Freq: Once | OROMUCOSAL | Status: AC
  Administered 2021-10-14: 15 mL via OROMUCOSAL
  Filled 2021-10-14: qty 15

## 2021-10-14 MED ORDER — ONDANSETRON HCL 4 MG/2ML IJ SOLN: 4.0000 mg | Freq: Once | INTRAMUSCULAR | Status: DC | PRN

## 2021-10-14 MED ORDER — ACETAMINOPHEN 160 MG/5ML PO SOLN: 650.0000 mg | ORAL | Status: DC | PRN

## 2021-10-14 MED ORDER — FENTANYL CITRATE (PF) 100 MCG/2ML IJ SOLN
INTRAMUSCULAR | Status: AC
  Filled 2021-10-14: qty 2

## 2021-10-14 MED ORDER — CLOPIDOGREL BISULFATE 75 MG PO TABS: 75.0000 mg | ORAL_TABLET | Freq: Every day | ORAL | Status: DC

## 2021-10-14 MED ORDER — SODIUM CHLORIDE 0.9 % IV SOLN: INTRAVENOUS | Status: DC

## 2021-10-14 MED ORDER — ASPIRIN 81 MG PO CHEW
CHEWABLE_TABLET | ORAL | Status: AC
  Administered 2021-10-14: 243 mg
  Filled 2021-10-14: qty 3

## 2021-10-14 MED ORDER — ASPIRIN 325 MG PO TABS
325.0000 mg | ORAL_TABLET | Freq: Every day | ORAL | Status: DC
  Administered 2021-10-15: 325 mg via ORAL
  Filled 2021-10-14: qty 1

## 2021-10-14 MED ORDER — IOHEXOL 300 MG/ML  SOLN
100.0000 mL | Freq: Once | INTRAMUSCULAR | Status: AC | PRN
  Administered 2021-10-14: 35 mL via INTRA_ARTERIAL

## 2021-10-14 NOTE — Anesthesia Postprocedure Evaluation (Signed)
Anesthesia Post Note ? ?Patient: Hosea Hanawalt ? ?Procedure(s) Performed: IR WITH ANESTHESIA STENT PLACEMENT ? ?  ? ?Patient location during evaluation: PACU ?Anesthesia Type: MAC ?Level of consciousness: awake and alert ?Pain management: pain level controlled ?Vital Signs Assessment: post-procedure vital signs reviewed and stable ?Respiratory status: spontaneous breathing, nonlabored ventilation and respiratory function stable ?Cardiovascular status: blood pressure returned to baseline and stable ?Postop Assessment: no apparent nausea or vomiting ?Anesthetic complications: no ? ? ?No notable events documented. ? ?Last Vitals:  ?Vitals:  ? 10/14/21 1155 10/14/21 1200  ?BP: (!) 109/58   ?Pulse: (!) 53   ?Resp: 16   ?Temp:  (!) 36.4 ?C  ?SpO2: 97%   ?  ?Last Pain:  ?Vitals:  ? 10/14/21 1140  ?TempSrc:   ?PainSc: 0-No pain  ? ? ?  ?  ?  ?  ?  ?  ? ?Lidia Collum ? ? ? ? ?

## 2021-10-14 NOTE — Anesthesia Procedure Notes (Addendum)
Procedure Name: [place] ?Date/Time: 10/14/2021 10:05 AM ?Performed by: Imagene Riches, CRNA ?Pre-anesthesia Checklist: Patient identified, Emergency Drugs available, Suction available, Timeout performed and Patient being monitored ?Patient Re-evaluated:Patient Re-evaluated prior to induction ?Oxygen Delivery Method: Simple face mask ? ? ? ? ?

## 2021-10-14 NOTE — Progress Notes (Signed)
?  Transition of Care (TOC) Screening Note ? ? ?Patient Details  ?Name: Brandon Daniel ?Date of Birth: [birth year] ? ? ?Transition of Care (TOC) CM/SW Contact:    ?Benard Halsted, LCSW ?Phone Number: ?10/14/2021, 1:43 PM ? ? ? ?Transition of Care Department [facility]) has reviewed patient and no TOC needs have been identified at this time. We will continue to monitor patient advancement through interdisciplinary progression rounds. If new patient transition needs arise, please place a TOC consult. Patient resides under long term care at [facility].  ? ? ?

## 2021-10-14 NOTE — Procedures (Signed)
INTERVENTIONAL NEURORADIOLOGY BRIEF POSTPROCEDURE NOTE ? ?DIAGNOSTIC CEREBRAL ANGIOGRAM AND RIGHT CAROTID STENTING WITH CEREBRAL PROTECTION DEVICE  ? ?Attending: Dr. Erven Colla de Sindy Messing ? ?Assistant: Dr. Frazier Richards ? ?Diagnosis: Right carotid stenosis  ? ?Access site: Right common femoral artery  ? ?Access closure: 8 French Angio-Seal  ? ?Anesthesia: Monitored anesthesia care  ? ?Medication used: Refer to anesthesia documentation. ? ?Complications: None  ? ?Estimated blood loss: Minimal  ? ?Specimen: None  ? ?Findings: Right carotid stenosis (approximately 60%).  And Emboshield nav 6 cerebral protection device was deployed in the distal cervical right ICA.  Angioplasty performed with a 4 x 30 mm Viatrac balloon.  A 9-7 x 40 mm XACT carotid stent was deployed across the stenosis.  In stent angioplasty was then performed with a 6 x 30 mm Viatrac balloon with resolution of carotid stenosis.  No evidence of hemorrhagic or thromboembolic complication.  ? ?The patient tolerated the procedure well without incident or complication and is in stable condition.  ? ?Plan: ?- Bed rest x6 hours post femoral puncture.  ?- SBP 100-140 mm Hg.  ? ?

## 2021-10-14 NOTE — Progress Notes (Signed)
Patient is s/p right ICA angioplasty and stent placement via R CFA approach with Dr. Karenann Cai today.  ? ?Patient seen in 4N with Dr. Karenann Cai and Dr. Gerhard Perches.  ?Patient laying in bed, NAD. RN at bedside.  ? ?Patient is non-verbal at baseline, give thumbs up when asked how he was doing.  ? ?Right CFA puncture site c/d/I.  ?DP 2+ bilaterally.  ? ?PLAN  ?- Patient will stay in NICU for overnight observation, plan for d/c tomorrow if stable. ?- Patient will receive ASA 325 mg and Plavix 75 mg tomorrow AM  ?- Plavix 75 mg qd and Eliquis 5 mg BID once discharged ?- F/U US duplex bilateral carotid in three month, order placed.  ?- F/U visit with Dr. Karenann Cai at [place], order placed.  ? ?NIR to follow, please call NIR for  urgent questions and concerns.  ? ?Tera Mater PA-C ?10/14/2021 2:40 PM ? ? ? ?

## 2021-10-14 NOTE — Procedures (Deleted)
  The note originally documented on this encounter has been moved the the encounter in which it belongs.  

## 2021-10-14 NOTE — H&P (Signed)
? ?Chief Complaint: ?Patient was seen in consultation today for carotid stenosis ? ?Supervising Physician: Pedro Earls ? ?Patient Status: [facility] - Out-pt ? ?History of Present Illness: ?Brandon Daniel is a 71 y.o. male with history of asthma, COPD, CAD, HLD, HTN, CVA known to NIR from recent diagnostic angiogram with Dr. Karenann Cai which showed: ?1. 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 ?2. Prominent atherosclerotic changes of the right carotid ?bifurcation resulting 70% stenosis of the right internal carotid ?artery at the bulb. ?3. Arterial supply to the left anterior circulation is mainly via ?left posterior communicating artery, left PCA to MCA leptomeningeal ?collaterals, tiny posterior pericallosal artery and right ?meningohypophyseal trunk to left meningohypophyseal trunk ?anastomosis. ? ?Patient returns today to [facility] for endovascular intervention of his recently identified right carotid stenosis.  Patient is non-verbal at baseline, although very cognizant and aware.  He does communication with hand gestures and is able to indicate that he wishes to proceed.  His sister is also available today for formal consent given his non-verbal status.  He has been NPO today.  He did take Plavix 75mg , aspirin 81mg .  ? ?Past Medical History:  ?Diagnosis Date  ? Asthma-COPD overlap syndrome ([place])   ? CAD (coronary artery disease)   ? COPD (chronic obstructive pulmonary disease) ([place])   ? HLD (hyperlipidemia)   ? HTN (hypertension)   ? Lumbar disc herniation with radiculopathy   ? Lumbar stenosis   ? Osteoporosis   ? Stroke [facility])   ? ? ?Past Surgical History:  ?Procedure Laterality Date  ? CATARACT EXTRACTION, BILATERAL  2021  ? CORONARY ARTERY BYPASS GRAFT  2005  ? [place]  ? IR ANGIO EXTRACRAN SEL COM CAROTID INNOMINATE UNI BILAT MOD SED  09/09/2021  ? IR ANGIO VERTEBRAL SEL SUBCLAVIAN INNOMINATE UNI R MOD SED  09/09/2021  ?  IR ANGIO VERTEBRAL SEL VERTEBRAL UNI L MOD SED  09/09/2021  ? IR US GUIDE VASC ACCESS RIGHT  09/09/2021  ? LEFT HEART CATH AND CORS/GRAFTS ANGIOGRAPHY N/A 12/08/2020  ? Procedure: LEFT HEART CATH AND CORS/GRAFTS ANGIOGRAPHY;  Surgeon: Dixie Dials, MD;  Location: [place] [facility];  Service: Cardiovascular;  Laterality: N/A;  ? LUMBAR [place] SURGERY  2019  ? ? ?Allergies: ?Patient has no known allergies. ? ?Medications: ?Prior to Admission medications   ?Medication Sig Start Date End Date Taking? Authorizing Provider  ?alendronate (FOSAMAX) 70 MG tablet Take 70 mg by mouth once a week. Take with a full glass of water on an empty stomach.   Yes [provider]  ?arformoterol (BROVANA) 15 MCG/2ML NEBU Take 2 mLs (15 mcg total) by nebulization 2 (two) times daily. 01/07/21  Yes Mercy Riding, MD  ?aspirin 81 MG chewable tablet Chew 81 mg by mouth daily.   Yes [provider]  ?atorvastatin (LIPITOR) 80 MG tablet Take 80 mg by mouth daily at 6 PM.   Yes [provider]  ?budesonide (PULMICORT) 0.5 MG/2ML nebulizer solution Take 2 mLs (0.5 mg total) by nebulization 2 (two) times daily. 01/07/21  Yes Mercy Riding, MD  ?carboxymethylcellulose (REFRESH PLUS) 0.5 % SOLN Place 1 drop into both eyes daily.   Yes [provider]  ?Cholecalciferol (VITAMIN D) 50 MCG (2000 UT) tablet Take 2,000 Units by mouth daily.   Yes [provider]  ?clopidogrel (PLAVIX) 75 MG tablet Take 75 mg by mouth daily.   Yes [provider]  ?  docusate sodium (COLACE) 100 MG capsule Take 1 capsule (100 mg total) by mouth daily. 01/08/21  Yes Mercy Riding, MD  ?ezetimibe (ZETIA) 10 MG tablet Take 10 mg by mouth daily.   Yes [provider]  ?ferrous sulfate 325 (65 FE) MG tablet Take 1 tablet (325 mg total) by mouth 2 (two) times daily with a meal. 01/07/21  Yes Mercy Riding, MD  ?FLUoxetine (PROZAC) 40 MG capsule Take 40 mg by mouth daily.   Yes [provider]  ?gabapentin  (NEURONTIN) 300 MG capsule Take 1 capsule (300 mg total) by mouth 2 (two) times daily. 01/07/21  Yes Mercy Riding, MD  ?Infant Care Products [facility]) OINT Apply 1 application. topically See admin instructions. Apply to buttocks two times per day and after incontinent episodes   Yes [provider]  ?meclizine (ANTIVERT) 25 MG tablet Take 25 mg by mouth See admin instructions. Take 25 mg 1 hour prior to PT/OT or transfer   Yes [provider]  ?metFORMIN (GLUCOPHAGE) 500 MG tablet Take 1 tablet (500 mg total) by mouth 2 (two) times daily with a meal. 01/07/21 01/07/22 Yes Mercy Riding, MD  ?metoprolol tartrate (LOPRESSOR) 50 MG tablet Take 50 mg by mouth 2 (two) times daily. 03/16/21  Yes [provider]  ?montelukast (SINGULAIR) 10 MG tablet Take 10 mg by mouth at bedtime.   Yes [provider]  ?ondansetron (ZOFRAN) 4 MG tablet Take 1 tablet (4 mg total) by mouth 2 (two) times daily before a meal. 05/26/21  Yes Zehr, Janett Billow D, PA-C  ?ondansetron (ZOFRAN) 4 MG tablet Take 4 mg by mouth every 8 (eight) hours as needed for nausea or vomiting.   Yes [provider]  ?oxybutynin (DITROPAN) 5 MG tablet Take 2.5 mg by mouth 2 (two) times daily.   Yes [provider]  ?pantoprazole (PROTONIX) 40 MG tablet Take 1 tablet (40 mg total) by mouth 2 (two) times daily before a meal. 05/26/21  Yes Zehr, Janett Billow D, PA-C  ?scopolamine (TRANSDERM-SCOP) 1 MG/3DAYS Place 1 patch onto the skin every 3 (three) days.   Yes [provider]  ?tamsulosin (FLOMAX) 0.4 MG CAPS capsule Take 1 capsule (0.4 mg total) by mouth daily after supper. 01/07/21  Yes Mercy Riding, MD  ?traMADol (ULTRAM) 50 MG tablet Take 50 mg by mouth every 6 (six) hours as needed (pain).   Yes [provider]  ?nitroGLYCERIN (NITROSTAT) 0.4 MG SL tablet Place 0.4 mg under the tongue every 5 (five) minutes as needed for chest pain.    [provider]  ?OXYGEN Inhale 2 L into the lungs  daily as needed (shortness of breath).    [provider]  ?  ? ?Family History  ?Problem Relation Age of Onset  ? Cancer Mother   ? CAD Father   ? Diabetes Other   ? Colon cancer Other   ? Prostate cancer Other   ? ? ?Social History  ? ?Socioeconomic History  ? Marital status: Divorced  ?  Spouse name: Not on file  ? Number of children: Not on file  ? Years of education: Not on file  ? Highest education level: Not on file  ?Occupational History  ? Not on file  ?Tobacco Use  ? Smoking status: Former  ?  Packs/day: 2.00  ?  Years: 30.00  ?  Pack years: 60.00  ?  Types: Cigarettes  ? Smokeless tobacco: Never  ?Vaping Use  ? Vaping Use:  Never used  ?Substance and Sexual Activity  ? Alcohol use: Never  ? Drug use: Never  ? Sexual activity: Not on file  ?Other Topics Concern  ? Not on file  ?Social History Narrative  ? 02/24/21 living at [facility]  ? ?Social Determinants of Health  ? ?Financial Resource Strain: Not on file  ?Food Insecurity: Not on file  ?Transportation Needs: Not on file  ?Physical Activity: Not on file  ?Stress: Not on file  ?Social Connections: Not on file  ? ? ? ?Review of Systems: A 12 point ROS discussed and pertinent positives are indicated in the HPI above.  All other systems are negative. ? ?Review of Systems  ?Unable to perform ROS: Patient nonverbal  ? ?Vital Signs: ?BP 123/86   Pulse (!) 50   Temp 98.1 ?F (36.7 ?C) (Oral)   Resp 18   Ht 6' (1.829 m)   Wt 149 lb 14.6 oz (68 kg)   SpO2 96%   BMI 20.33 kg/m?  ? ?Physical Exam ?Vitals and nursing note reviewed.  ?Constitutional:   ?   General: He is not in acute distress. ?   Appearance: He is not ill-appearing.  ?   Comments: Non-verbal  ?HENT:  ?   Mouth/Throat:  ?   Mouth: Mucous membranes are moist.  ?   Pharynx: Oropharynx is clear.  ?Cardiovascular:  ?   Rate and Rhythm: Normal rate and regular rhythm.  ?Pulmonary:  ?   Effort: Pulmonary effort is normal.  ?   Breath sounds: Normal breath sounds.  ?Abdominal:  ?    General: Abdomen is flat.  ?   Palpations: Abdomen is soft.  ?Neurological:  ?   General: No focal deficit present.  ?   Mental Status: He is alert and oriented to person, place, and time. Mental status is at baseline.  ?

## 2021-10-14 NOTE — Plan of Care (Signed)

## 2021-10-14 NOTE — Transfer of Care (Signed)
Immediate Anesthesia Transfer of Care Note ? ?Patient: Brandon Daniel ? ?Procedure(s) Performed: IR WITH ANESTHESIA STENT PLACEMENT ? ?Patient Location: PACU ? ?Anesthesia Type:General ? ?Level of Consciousness: drowsy ? ?Airway & Oxygen Therapy: Patient Spontanous Breathing and Patient connected to face mask oxygen ? ?Post-op Assessment: Report given to RN and Post -op Vital signs reviewed and stable ? ?Post vital signs: Reviewed and stable ? ?Last Vitals:  ?Vitals Value Taken Time  ?BP 104/61 10/14/21 1138  ?Temp    ?Pulse 61 10/14/21 1141  ?Resp 14 10/14/21 1141  ?SpO2 100 % 10/14/21 1141  ?Vitals shown include unvalidated device data. ? ?Last Pain:  ?Vitals:  ? 10/14/21 0730  ?TempSrc:   ?PainSc: 0-No pain  ?   ? ?  ? ?Complications: No notable events documented. ?

## 2021-10-15 ENCOUNTER — Encounter (HOSPITAL_COMMUNITY): Payer: Self-pay

## 2021-10-15 HISTORY — PX: IR ANGIO INTRA EXTRACRAN SEL COM CAROTID INNOMINATE UNI R MOD SED: IMG5359

## 2021-10-15 MED ORDER — APIXABAN 5 MG PO TABS: 5.0000 mg | ORAL_TABLET | Freq: Two times a day (BID) | ORAL | 1 refills | Status: AC

## 2021-10-15 NOTE — Progress Notes (Signed)
Interventional Radiology Brief Note: ? ?As specified in care plan post-procedure, patient was discharged home on Plavix 75 mg PO daily, Eliquis 5mg  BID. Prescriptions were sent to patient's preferred pharmacy.  ?Information included in discharge information and AVS.  ? ? , MS RD PA-C ? ? ?

## 2021-10-15 NOTE — NC FL2 (Signed)
?[place] MEDICAID FL2 LEVEL OF CARE SCREENING TOOL  ?  ? ?IDENTIFICATION  ?Patient Name: ?Brandon Daniel Birthdate: [birth year] Sex: male Admission Date (Current Location): ?10/14/2021  ?[place] Number: ? [place] ?  Facility and Address:  ?[facility], [address] [place], [place] [address] ?     Provider Number: ?[id number]  ?Attending Physician Name and Address:  ?de Melchor Amour, Georgiann Hahn* ? Relative Name and Phone Number:  ?  ?   ?Current Level of Care: ?Hospital Recommended Level of Care: ?Skilled Nursing Facility Prior Approval Number: ?  ? ?Date Approved/Denied: ?  PASRR Number: ?[id number] A ? ?Discharge Plan: ?SNF ?  ? ?Current Diagnoses: ?Patient Active Problem List  ? Diagnosis Date Noted  ? S/P endovascular aneurysm repair 10/14/2021  ? Stenosis of right carotid artery 10/14/2021  ? Nausea and vomiting 05/26/2021  ? DVT (deep venous thrombosis) (HCC) 03/30/2021  ? Pressure injury of skin 12/24/2020  ? Acute respiratory failure with hypoxia (HCC)   ? NSTEMI (non-ST elevated myocardial infarction) (HCC) 12/03/2020  ? Influenza A 12/03/2020  ? CAD (coronary artery disease) 12/03/2020  ? Asthma 12/03/2020  ? Essential hypertension 12/03/2020  ? Hyperlipidemia 12/03/2020  ? Dyspnea 12/03/2020  ? Pleural effusion on left 12/03/2020  ? 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 Lumbosacral disc herniation   ? Chest pain 10/20/2020  ? ? ?Orientation RESPIRATION BLADDER Height & Weight   ?  ?Self, Time, Situation, Place ? Normal Indwelling catheter, Incontinent Weight: 158 lb 1.1 oz (71.7 kg) ?Height:  6' (182.9 cm)  ?BEHAVIORAL SYMPTOMS/MOOD NEUROLOGICAL BOWEL NUTRITION STATUS  ?    Continent Diet (See dc summary)  ?AMBULATORY STATUS COMMUNICATION OF NEEDS Skin   ?Limited Assist Verbally Normal ?  ?  ?  ?    ?     ?     ? ? ?Personal Care Assistance Level of Assistance  ?Bathing, Feeding, Dressing Bathing Assistance: Maximum assistance ?Feeding assistance: Limited  assistance ?Dressing Assistance: Limited assistance ?   ? ?Functional Limitations Info  ?    ?  ?   ? ? ?SPECIAL CARE FACTORS FREQUENCY  ?    ?  ?  ?  ?  ?  ?  ?   ? ? ?Contractures Contractures Info: Not present  ? ? ?Additional Factors Info  ?Code Status, Allergies (hx of ESBL) Code Status Info: Full ?Allergies Info: NKA ?  ?  ?  ?   ? ?Current Medications (10/15/2021):  This is the current hospital active medication list ?Current Facility-Administered Medications  ?Medication Dose Route Frequency Provider Last Rate Last Admin  ? acetaminophen (TYLENOL) tablet 650 mg  650 mg Oral Q4H PRN de Melchor Amour, Jerilynn Mages, MD      ? Or  ? acetaminophen (TYLENOL) 160 MG/5ML solution 650 mg  650 mg Per Tube Q4H PRN de Melchor Amour, Jerilynn Mages, MD      ? Or  ? acetaminophen (TYLENOL) suppository 650 mg  650 mg Rectal Q4H PRN de Melchor Amour, Jerilynn Mages, MD      ? aspirin tablet 325 mg  325 mg Oral Daily de Melchor Amour, Jerilynn Mages, MD   325 mg at 10/15/21 6629  ? Or  ? aspirin chewable tablet 324 mg  324 mg Per Tube Daily de Melchor Amour, Jerilynn Mages, MD      ? Chlorhexidine Gluconate Cloth 2 % PADS 6 each  6 each Topical Q0600 de Glori Luis, MD      ? clevidipine (CLEVIPREX)  infusion 0.5 mg/mL  0-21 mg/hr Intravenous Continuous de Melchor Amour, Jerilynn Mages, MD      ? clopidogrel (PLAVIX) tablet 75 mg  75 mg Oral Daily de Melchor Amour, Jerilynn Mages, MD   75 mg at 10/15/21 2458  ? Or  ? clopidogrel (PLAVIX) tablet 75 mg  75 mg Per Tube Daily de Melchor Amour, Jerilynn Mages, MD      ? mupirocin ointment (BACTROBAN) 2 % 1 application.  1 application. Nasal BID de Glori Luis, MD   1 application. at 10/15/21 0904  ? ondansetron (ZOFRAN) injection 4 mg  4 mg Intravenous Q6H PRN de Glori Luis, MD      ? ? ? ?Discharge Medications: ?Please see discharge summary for a list of discharge medications. ? ?Relevant Imaging Results: ? ?Relevant Lab Results: ? ? ?Additional  Information ?SS#: [id number]. Moderna COVID-19 Vaccine 06/05/2020 , 10/15/2019 , 09/12/2019 ? ?Renne Crigler Isiaah Cuervo, LCSW ? ? ? ? ?

## 2021-10-15 NOTE — TOC Initial Note (Signed)
Transition of Care (TOC) - Initial/Assessment Note  ? ? ?Patient Details  ?Name: Brandon Daniel ?MRN: [id number] ?Date of Birth: 1951-03-20 ? ?Transition of Care (TOC) CM/SW Contact:    ?Benard Halsted, LCSW ?Phone Number: ?10/15/2021, 12:18 PM ? ?Clinical Narrative:                 ?Patient ready to return to [facility] today. Facility aware.  ? ?Expected Discharge Plan: [place] ?Barriers to Discharge: No Barriers Identified ? ? ?Patient Goals and CMS Choice ?Patient states their goals for this hospitalization and ongoing recovery are:: Return to snf ?CMS Medicare.gov Compare Post Acute Care list provided to:: Patient ?Choice offered to / list presented to : Patient ? ?Expected Discharge Plan and Services ?Expected Discharge Plan: [place] ?In-house Referral: Clinical Social Work ?  ?Post Acute Care Choice: [place] ?Living arrangements for the past 2 months: [place] ?Expected Discharge Date: 10/15/21               ?  ?  ?  ?  ?  ?  ?  ?  ?  ?  ? ?Prior Living Arrangements/Services ?Living arrangements for the past 2 months: [place] ?Lives with:: Facility Resident ?Patient language and need for interpreter reviewed:: Yes ?Do you feel safe going back to the place where you live?: Yes      ?Need for Family Participation in Patient Care: Yes (Comment) ?Care giver support system in place?: Yes (comment) ?  ?Criminal Activity/Legal Involvement Pertinent to Current Situation/Hospitalization: No - Comment as needed ? ?Activities of Daily Living ?Home Assistive Devices/Equipment: Wheelchair, Environmental consultant (specify type), CBG Meter ?ADL Screening (condition at time of admission) ?Patient's cognitive ability adequate to safely complete daily activities?: Yes ?Is the patient deaf or have difficulty hearing?: No ?Does the patient have difficulty seeing, even when wearing glasses/contacts?: No ?Does the patient have difficulty concentrating, remembering,  or making decisions?: No ?Patient able to express need for assistance with ADLs?: Yes ?Does the patient have difficulty dressing or bathing?: No ?Independently performs ADLs?: Yes (appropriate for developmental age) ?Communication: Independent ?Dressing (OT): Dependent ?Is this a change from baseline?: Pre-admission baseline ?Grooming: Dependent ?Is this a change from baseline?: Pre-admission baseline ?Feeding: Needs assistance ?Is this a change from baseline?: Pre-admission baseline ?Bathing: Dependent ?Is this a change from baseline?: Pre-admission baseline ?Toileting: Dependent ?Is this a change from baseline?: Pre-admission baseline ?In/Out Bed: Dependent ?Is this a change from baseline?: Pre-admission baseline ?Walks in Home: Dependent ?Is this a change from baseline?: Pre-admission baseline ?Does the patient have difficulty walking or climbing stairs?: No ?Weakness of Legs: Right ?Weakness of Arms/Hands: None ? ?Permission Sought/Granted ?Permission sought to share information with : Facility Sport and exercise psychologist, Family Supports ?Permission granted to share information with : Yes, Verbal Permission Granted ?   ? Permission granted to share info w AGENCY: [facility] ?   ?   ? ?Emotional Assessment ?Appearance:: Appears stated age ?  ?  ?Orientation: : Oriented to Self, Oriented to Place, Oriented to  Time, Oriented to Situation ?Alcohol / Substance Use: Not Applicable ?Psych Involvement: No (comment) ? ?Admission diagnosis:  S/P endovascular aneurysm repair [id number], Z86.79] ?Stenosis of right carotid artery [I65.21] ?Patient Active Problem List  ? Diagnosis Date Noted  ? S/P endovascular aneurysm repair 10/14/2021  ? Stenosis of right carotid artery 10/14/2021  ? Nausea and vomiting 05/26/2021  ? DVT (deep venous thrombosis) ([place]) 03/30/2021  ? Pressure injury of skin 12/24/2020  ?  Acute respiratory failure with hypoxia ([place])   ? NSTEMI (non-ST elevated myocardial infarction) ([place]) 12/03/2020  ? Influenza  A 12/03/2020  ? CAD (coronary artery disease) 12/03/2020  ? Asthma 12/03/2020  ? Essential hypertension 12/03/2020  ? Hyperlipidemia 12/03/2020  ? Dyspnea 12/03/2020  ? Pleural effusion on left 12/03/2020  ? Stroke (cerebrum) ([place]) 12/03/2020  ? Lumbosacral disc herniation   ? Chest pain 10/20/2020  ? ?PCP:  Pcp, No ?Pharmacy:   ?[facility] [id number] - [place], [address] [place] ?[place] ?[place] [address] ?Phone: [phone] Fax: [phone] ? ?[facility] ?[address] ?[place] [address] ?Phone: [phone] Fax: [phone] ? ? ? ? ?Social Determinants of Health (SDOH) Interventions ?  ? ?Readmission Risk Interventions ?   ? View : No data to display.  ?  ?  ?  ? ? ? ?

## 2021-10-15 NOTE — TOC Transition Note (Signed)
Transition of Care (TOC) - CM/SW Discharge Note ? ? ?Patient Details  ?Name: Square Jowett ?MRN: [id number] ?Date of Birth: 03-27-1951 ? ?Transition of Care (TOC) CM/SW Contact:  ?Mearl Latin, LCSW ?Phone Number: ?10/15/2021, 12:19 PM ? ? ?Clinical Narrative:    ?Patient will DC to: [facility] ?Anticipated DC date: 10/15/21 ?Family notified: Family at bedside ?Transport by: Sharin Mons ? ? ?Per MD patient ready for DC to [facility]. RN to call report prior to discharge [phone]). RN, patient, patient's family, and facility notified of DC. Discharge Summary and FL2 sent to facility. DC packet on chart. Ambulance transport requested for patient.  ? ?CSW will sign off for now as social work intervention is no longer needed. Please consult [place] again if new needs arise. ? ? ? ? ?Final next level of care: Skilled Nursing Facility ?Barriers to Discharge: No Barriers Identified ? ? ?Patient Goals and CMS Choice ?Patient states their goals for this hospitalization and ongoing recovery are:: Return to snf ?CMS Medicare.gov Compare Post Acute Care list provided to:: Patient ?Choice offered to / list presented to : Patient ? ?Discharge Placement ?  ?Existing PASRR number confirmed : 10/15/21          ?Patient [facility] bed at: [facility] ?Patient to be transferred to facility by: [facility] ?  ?Patient and family notified of of transfer: 10/15/21 ? ?Discharge Plan and Services ?In-house Referral: Clinical Social Work ?  ?Post Acute Care Choice: Skilled Nursing Facility          ?  ?  ?  ?  ?  ?  ?  ?  ?  ?  ? ?Social Determinants of Health (SDOH) Interventions ?  ? ? ?Readmission Risk Interventions ?   ? View : No data to display.  ?  ?  ?  ? ? ? ? ? ?

## 2021-10-15 NOTE — Discharge Summary (Signed)
? ? ?Patient ID: ?Brandon Daniel ?MRN: [id number] ?DOB/AGE: [birth year] 69 y.o. ? ?Admit date: 10/14/2021 ?Discharge date: 10/15/2021 ? ?Supervising Physician: Pedro Earls ? ?Patient Status: [facility] - In-pt ? ?Admission Diagnoses: Right cervical ICA stenosis  ? ?Discharge Diagnoses:  ?Right cervical ICA stenosis ? ?Discharged Condition: good ? ?Hospital Course:  ? ?Brandon Daniel is a 71 y.o. 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  He underwent a diagnostic cerebral angiogram on September 09, 2021 the confirmed cervical left ICA occlusion and short segment of approximately 70% stenosis of the cervical right ICA. He presented to [facility] 10/14/21 for right carotid artery stenting.  Patient underwent successful intervention now s/p angioplasty and stenting of the distal cervical right ICA. He was admitted overnight for observation.  He tolerated the procedure well with no acute issues overnight.  He is found to be in stable condition this AM.  He has no focal deficits. He is stable for discharge back to his skilled nursing facility.  ? ?Discharge Exam: ?Blood pressure 104/63, pulse 61, temperature 97.8 ?F (36.6 ?C), temperature source Oral, resp. rate 17, height 6' (1.829 m), weight 158 lb 1.1 oz (71.7 kg), SpO2 95 %. ?General appearance: alert, cooperative, and no distress ?Resp: clear to auscultation bilaterally ?Cardio: regular rate and rhythm, S1, S2 normal, no murmur, click, rub or gallop ?Incision/Wound: ?Procedure site intact.  Non-tender.  No evidence of pseudoaneurysm or hematoma.  ?Distal pulses intact.  ? ? ?Disposition: Discharge disposition: 03-Skilled Nursing Facility ? ? ? ? ? ? ?Discharge Instructions   ? ? Call MD for:  persistant dizziness or light-headedness   Complete by: As directed ?  ? Call MD for:  persistant nausea  and vomiting   Complete by: As directed ?  ? Call MD for:  redness, tenderness, or signs of infection (pain, swelling, redness, odor or green/yellow discharge around incision site)   Complete by: As directed ?  ? Diet - low sodium heart healthy   Complete by: As directed ?  ? Discharge instructions   Complete by: As directed ?  ? No heavy lifting, bending, or stooping for 2 weeks. ?Patient should stop aspirin, continue Plavix 75 mg daily, and start Eliquis 5mg  BID.  ? Increase activity slowly   Complete by: As directed ?  ? Remove dressing in 24 hours   Complete by: As directed ?  ? ?  ? ?Allergies as of 10/15/2021   ?No Known Allergies ?  ? ?  ?Medication List  ?  ? ?STOP taking these medications   ? ?aspirin 81 MG chewable tablet ?  ? ?  ? ?TAKE these medications   ? ?alendronate 70 MG tablet ?Commonly known as: FOSAMAX ?Take 70 mg by mouth once a week. Take with a full glass of water on an empty stomach. ?  ?apixaban 5 MG Tabs tablet ?Commonly known as: ELIQUIS ?Take 1 tablet (5 mg total) by mouth 2 (two) times daily. ?  ?arformoterol 15 MCG/2ML Nebu ?Commonly known as: BROVANA ?Take 2 mLs (15 mcg total) by nebulization 2 (two) times daily. ?  ?atorvastatin 80 MG tablet ?Commonly known as: LIPITOR ?Take 80 mg by mouth daily at 6 PM. ?  ?budesonide 0.5 MG/2ML nebulizer solution ?Commonly known as: PULMICORT ?Take 2 mLs (0.5 mg total) by nebulization 2 (two) times daily. ?  ?carboxymethylcellulose  0.5 % Soln ?Commonly known as: REFRESH PLUS ?Place 1 drop into both eyes daily. ?  ?clopidogrel 75 MG tablet ?Commonly known as: PLAVIX ?Take 75 mg by mouth daily. ?  ?Dermacloud Oint ?Apply 1 application. topically See admin instructions. Apply to buttocks two times per day and after incontinent episodes ?  ?docusate sodium 100 MG capsule ?Commonly known as: COLACE ?Take 1 capsule (100 mg total) by mouth daily. ?  ?ezetimibe 10 MG tablet ?Commonly known as: ZETIA ?Take 10 mg by mouth daily. ?  ?ferrous sulfate 325 (65 FE)  MG tablet ?Take 1 tablet (325 mg total) by mouth 2 (two) times daily with a meal. ?  ?FLUoxetine 40 MG capsule ?Commonly known as: PROZAC ?Take 40 mg by mouth daily. ?  ?gabapentin 300 MG capsule ?Commonly known as: NEURONTIN ?Take 1 capsule (300 mg total) by mouth 2 (two) times daily. ?  ?meclizine 25 MG tablet ?Commonly known as: ANTIVERT ?Take 25 mg by mouth See admin instructions. Take 25 mg 1 hour prior to PT/OT or transfer ?  ?metFORMIN 500 MG tablet ?Commonly known as: Glucophage ?Take 1 tablet (500 mg total) by mouth 2 (two) times daily with a meal. ?  ?metoprolol tartrate 50 MG tablet ?Commonly known as: LOPRESSOR ?Take 50 mg by mouth 2 (two) times daily. ?  ?montelukast 10 MG tablet ?Commonly known as: SINGULAIR ?Take 10 mg by mouth at bedtime. ?  ?nitroGLYCERIN 0.4 MG SL tablet ?Commonly known as: NITROSTAT ?Place 0.4 mg under the tongue every 5 (five) minutes as needed for chest pain. ?  ?ondansetron 4 MG tablet ?Commonly known as: ZOFRAN ?Take 4 mg by mouth every 8 (eight) hours as needed for nausea or vomiting. ?  ?ondansetron 4 MG tablet ?Commonly known as: Zofran ?Take 1 tablet (4 mg total) by mouth 2 (two) times daily before a meal. ?  ?oxybutynin 5 MG tablet ?Commonly known as: DITROPAN ?Take 2.5 mg by mouth 2 (two) times daily. ?  ?OXYGEN ?Inhale 2 L into the lungs daily as needed (shortness of breath). ?  ?pantoprazole 40 MG tablet ?Commonly known as: PROTONIX ?Take 1 tablet (40 mg total) by mouth 2 (two) times daily before a meal. ?  ?scopolamine 1 MG/3DAYS ?Commonly known as: TRANSDERM-SCOP ?Place 1 patch onto the skin every 3 (three) days. ?  ?tamsulosin 0.4 MG Caps capsule ?Commonly known as: FLOMAX ?Take 1 capsule (0.4 mg total) by mouth daily after supper. ?  ?traMADol 50 MG tablet ?Commonly known as: ULTRAM ?Take 50 mg by mouth every 6 (six) hours as needed (pain). ?  ?Vitamin D 50 MCG (2000 UT) tablet ?Take 2,000 Units by mouth daily. ?  ? ?  ? ? Follow-up Information   ? ? de Rosario Jacks, MD Follow up.   ?Specialties: Radiology, Interventional Radiology ?Contact information: ?[address] ?[place] [address] ?[phone] ? ? ?  ?  ? ?  ?  ? ?  ?  ? ?Electronically Signed: ?Docia Barrier, PA ?10/15/2021, 10:11 AM ? ? ?I have spent Greater Than 30 Minutes discharging [facility]. ? ? ? ? ?

## 2021-12-02 ENCOUNTER — Encounter: Payer: Self-pay | Admitting: Adult Health

## 2021-12-02 ENCOUNTER — Ambulatory Visit (INDEPENDENT_AMBULATORY_CARE_PROVIDER_SITE_OTHER): Payer: Medicare Other | Admitting: Adult Health

## 2021-12-02 VITALS — BP 127/69 | HR 78

## 2021-12-02 DIAGNOSIS — I63232 Cerebral infarction due to unspecified occlusion or stenosis of left carotid arteries: Secondary | ICD-10-CM

## 2021-12-02 DIAGNOSIS — I6523 Occlusion and stenosis of bilateral carotid arteries: Secondary | ICD-10-CM | POA: Diagnosis not present

## 2021-12-02 DIAGNOSIS — G8191 Hemiplegia, unspecified affecting right dominant side: Secondary | ICD-10-CM | POA: Diagnosis not present

## 2021-12-02 NOTE — Patient Instructions (Addendum)
Would recommend evaluation by physical therapy for potential benefit, continue occupational therapy   Continue current medications at this time   Please ensure follow up with interventional radiology (IR) Dr. Karenann Cai around July for repeat carotid ultrasound - [phone]  Ongoing use of eliquis and plavix combination will be determined in IR   Continue to follow up with SNF regarding cholesterol and blood pressure management - please ensure routine monitoring of cholesterol levels are being completed  Maintain strict control of hypertension with blood pressure goal below 130/90 and cholesterol with LDL cholesterol (bad cholesterol) goal below 70 mg/dL.   Signs of a Stroke? Follow the BEFAST method:  Balance Watch for a sudden loss of balance, trouble with coordination or vertigo Eyes Is there a sudden loss of vision in one or both eyes? Or double vision?  Face: Ask the person to smile. Does one side of the face droop or is it numb?  Arms: Ask the person to raise both arms. Does one arm drift downward? Is there weakness or numbness of a leg? Speech: Ask the person to repeat a simple phrase. Does the speech sound slurred/strange? Is the person confused ? Time: If you observe any of these signs, call 911.    No further recommendations from out standpoint - can follow up as needed     Thank you for coming to see [place] at [facility]. I hope we have been able to provide you high quality care today.  You may receive a patient satisfaction survey over the next few weeks. We would appreciate your feedback and comments so that we may continue to improve ourselves and the health of our patients.

## 2021-12-02 NOTE — Progress Notes (Signed)
[facility] [address] [place]. [place] [address] [phone]       STROKE FOLLOW UP NOTE  [name] Date of Birth:  05-24-1951 Medical Record Number:  [id number]   Reason for Referral: stroke follow up    SUBJECTIVE:   CHIEF COMPLAINT:  Chief Complaint  Patient presents with   Follow-up    Rm 2 with sister Meyaser and EMS transport  Pt is well and stable, no new stroke concerns per sister      HPI:   Brandon Daniel is a 71 y.o. 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 Prolonged hospital course complicated by NSTEMI, RLE DVT, acute respiratory failure with influenza pneumonia and left pleural effusion and urinary retention.    Update 12/02/2021 JM: Patient returns for 80-month stroke follow-up accompanied by his sister.   Continues to reside at [facility].  Overall stable from stroke standpoint.  Residual deficits remain without much change since prior visit although sister mentions some movement of his right leg after he returned back from recent right ICA stenting procedure (see below).  Currently working with OT and sister is going to ask about PT.  Maintaining modified diet.  Remains dependent for ADLs.  Blood pressure today 127/69.  After prior visit, completed 6 months duration of Eliquis for RLE DVT and continued on aspirin alone.  He underwent cerebral angiogram with Dr. Tommie Sams 08/2021 which showed right ICA 70% stenosis and left ICA occlusion.  He underwent successful right carotid stenting 4/20 and transitioned from aspirin to Plavix and Eliquis. Plan to follow up 3 months postprocedure.  Per MAR review, he has remained on Plavix and Eliquis as well as fenofibrate, denies side effects.  Reports routine lab work completed at [facility].  No further concerns at this time.       History provided for  reference purposes only Update 06/03/2021 JM: Returns for 17-month stroke follow-up via stretcher accompanied by sister who provides majority of history. EMS transporters waiting outside of room  Continues to reside at [facility] Stable since prior visit without new stroke/TIA symptoms Residual right hemiplegia, dysphagia and expressive aphasia stable without any improvement since prior visit No longer getting therapies - sister unsure if currently on a break or was not improving  Sister concerned that since he was diagnosed with pneumonia 2 weeks ago, he appears to be more depressed - not talking as much (although only makes grunting noises at baseline) and appetite fluctuates.  Per review of MAR, recently started on fluoxetine for depression. Currently on NTL and mech soft/ground  Sister continues to be frustrated by his lack of progress and questions when they will start to see improvement  Per MAR review, on Eliquis and aspirin.  No obvious side effects.  He is to complete Eliquis at the end of this month as total of 21-month duration will be completed with repeat LE [place] on 03/24/2021 resolution of DVT On atorvastatin and Zetia -denies side effects  Blood pressure today 128/64  No further concerns at this time   Initial visit 02/24/2021 JM: Brandon Daniel is being seen for hospital follow-up via EMS stretcher accompanied by his sister and 2 EMS personnel.  Currently residing [facility] long-term care.  Sister provides majority of history.  Reports currently working with PT/OT/SLP but no significant improvement.  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  No residual  swallowing difficulties currently on regular diet without difficulty (per sister report).  He remains nonambulatory.  Foley catheter intact for continued urinary retention followed closely by urology.  Sister concerned regarding recent vomiting after meals - of note, episode of emesis in lobby while waiting for  visit. Per MAR, current use of Zofran PRN and pantoprazole.  He is also remained on Eliquis, aspirin, atorvastatin and Zetia.  Blood pressure today 134/80.  He has not yet had follow-up with IR (IR attempted to call but unable to contact per epic review).   Stroke admission 12/03/2020 Brandon Daniel is a 71 y.o. male with a past medical history significant for, HLD, HTN and lumbar stenosis who presented to [facility] on 12/03/2020 with SOB and diagnoses with influenza A. On the day of admission he developed mild R facial droop, R arm drift and hand grip weakness along with R leg weakness.  Personally reviewed hospitalization pertinent progress notes, lab work and imaging.  Evaluated by Dr. Pearlean Brownie for left MCA/ACA territory stroke s/p tPA secondary to symptomatic high-grade proximal left carotid stenosis. Eval by neuro IR to consider possible intervention recommended outpatient follow-up with hopeful neurological improvement prior to procedure. CTA head/neck left ICA severe stenosis/near occlusion and right ICA stenosis.  Carotid ultrasound right ICA 60 to 79% stenosis and left ICA 80 to 99% stenosis.  EF 55 to 60%.  LDL 55.  A1c 6.2.  Advised to continue DAPT with aspirin and Plavix (on PTA).  Hospital course complicated by NSTEMI, acute respiratory failure/influenza pneumonia/left pleural effusion, urinary retention, ESBL Klebsiella UTI, RLE DVT started on Eliquis in addition to aspirin. Plavix held until completion of Eliquis course. Therapy recommended discharge to SNF. D/c'd to SNF on 7/19.     PERTINENT IMAGING  CT Head WO IV Contrast 10Jun2022 No acute intracranial abnormality. ASPECTS is 10.   MRI Brain WO IV Contrast 10Jun2022 Diffuse gray matter infarction in the left ACA and MCA territories.   Echocardiogram Complete 10Jun2022 Left ventricular ejection fraction, by estimation, is 55 to 60%. The left ventricle has normal function. Left ventricular diastolic parameters are indeterminate. Elevated  left ventricular end-diastolic pressure.  Right ventricular systolic function is normal. The right ventricular size is normal. There is normal pulmonary artery systolic pressure. The estimated right ventricular systolic pressure is 11.0 mmHg.  The mitral valve is normal in structure. No evidence of mitral valve regurgitation. No evidence of mitral stenosis.  The aortic valve was not well visualized. Aortic valve regurgitation is not visualized. No aortic stenosis is present.  The inferior vena cava is normal in size with greater than 50% respiratory variability, suggesting right atrial pressure of 3 mmHg.   VAS US Carotid 10Jun2022 Right ICA 60-79% stenosis Left ICA 80-99% stenosis   CTA Head and Neck W WO IV Contrast 09Jun2022 Negative CTA for emergent large vessel occlusion. Extensive bulky calcified plaque about the left carotid bulb/proximal left ICA with associated severe near occlusive stenosis. Left ICA, MCA, and ACA markedly attenuated but remain patent distally. Short-segment 70% atheromatous stenosis at the proximal cervical right ICA. Severe left and moderate right vertebral artery origin stenoses.        ROS:   N/A  PMH:  Past Medical History:  Diagnosis Date   Asthma-COPD overlap syndrome (HCC)    CAD (coronary artery disease)    COPD (chronic obstructive pulmonary disease) (HCC)    HLD (hyperlipidemia)    HTN (hypertension)    Lumbar disc herniation with radiculopathy    Lumbar stenosis  Osteoporosis    Stroke [facility]     PSH:  Past Surgical History:  Procedure Laterality Date   CATARACT EXTRACTION, BILATERAL  2021   CORONARY ARTERY BYPASS GRAFT  2005   INGUINAL HERNIA REPAIR  1998   IR ANGIO EXTRACRAN SEL COM CAROTID INNOMINATE UNI BILAT MOD SED  09/09/2021   IR ANGIO INTRA EXTRACRAN SEL COM CAROTID INNOMINATE UNI R MOD SED  10/15/2021   IR ANGIO VERTEBRAL SEL SUBCLAVIAN INNOMINATE UNI R MOD SED  09/09/2021   IR ANGIO VERTEBRAL SEL VERTEBRAL UNI L MOD SED   09/09/2021   IR INTRAVSC STENT CERV CAROTID W/EMB-PROT MOD SED INCL ANGIO  10/14/2021   IR US GUIDE VASC ACCESS RIGHT  09/09/2021   IR US GUIDE VASC ACCESS RIGHT  10/14/2021   LEFT HEART CATH AND CORS/GRAFTS ANGIOGRAPHY N/A 12/08/2020   Procedure: LEFT HEART CATH AND CORS/GRAFTS ANGIOGRAPHY;  Surgeon: Orpah Cobb, MD;  Location: [facility] INVASIVE CV LAB;  Service: Cardiovascular;  Laterality: N/A;   LUMBAR DISC SURGERY  2019   RADIOLOGY WITH ANESTHESIA N/A 10/14/2021   Procedure: IR WITH ANESTHESIA STENT PLACEMENT;  Surgeon: Baldemar Lenis, MD;  Location: [facility] OR;  Service: Radiology;  Laterality: N/A;    Social History:  Social History   Socioeconomic History   Marital status: Divorced    Spouse name: Not on file   Number of children: Not on file   Years of education: Not on file   Highest education level: Not on file  Occupational History   Not on file  Tobacco Use   Smoking status: Former    Packs/day: 2.00    Years: 30.00    Total pack years: 60.00    Types: Cigarettes   Smokeless tobacco: Never  Vaping Use   Vaping Use: Never used  Substance and Sexual Activity   Alcohol use: Never   Drug use: Never   Sexual activity: Not on file  Other Topics Concern   Not on file  Social History Narrative   02/24/21 living at [facility]   Social Determinants of Health   Financial Resource Strain: Not on file  Food Insecurity: Not on file  Transportation Needs: Not on file  Physical Activity: Not on file  Stress: Not on file  Social Connections: Not on file  Intimate Partner Violence: Not on file    Family History:  Family History  Problem Relation Age of Onset   Cancer Mother    CAD Father    Diabetes Other    Colon cancer Other    Prostate cancer Other     Medications:   Current Outpatient Medications on File Prior to Visit  Medication Sig Dispense Refill   alendronate (FOSAMAX) 70 MG tablet Take 70 mg by mouth once a week. Take with a full glass of  water on an empty stomach.     apixaban (ELIQUIS) 5 MG TABS tablet Take 1 tablet (5 mg total) by mouth 2 (two) times daily. 60 tablet 1   arformoterol (BROVANA) 15 MCG/2ML NEBU Take 2 mLs (15 mcg total) by nebulization 2 (two) times daily. 120 mL    atorvastatin (LIPITOR) 80 MG tablet Take 80 mg by mouth daily at 6 PM.     budesonide (PULMICORT) 0.5 MG/2ML nebulizer solution Take 2 mLs (0.5 mg total) by nebulization 2 (two) times daily.  12   carboxymethylcellulose (REFRESH PLUS) 0.5 % SOLN Place 1 drop into both eyes daily.     Cholecalciferol (VITAMIN D) 50 MCG (  2000 UT) tablet Take 2,000 Units by mouth daily.     clopidogrel (PLAVIX) 75 MG tablet Take 75 mg by mouth daily.     docusate sodium (COLACE) 100 MG capsule Take 1 capsule (100 mg total) by mouth daily. 10 capsule 0   ezetimibe (ZETIA) 10 MG tablet Take 10 mg by mouth daily.     ferrous sulfate 325 (65 FE) MG tablet Take 1 tablet (325 mg total) by mouth 2 (two) times daily with a meal. 180 tablet 1   FLUoxetine (PROZAC) 40 MG capsule Take 40 mg by mouth daily.     gabapentin (NEURONTIN) 300 MG capsule Take 1 capsule (300 mg total) by mouth 2 (two) times daily.     Infant Care Products [facility]) OINT Apply 1 application. topically See admin instructions. Apply to buttocks two times per day and after incontinent episodes     meclizine (ANTIVERT) 25 MG tablet Take 25 mg by mouth See admin instructions. Take 25 mg 1 hour prior to PT/OT or transfer     metFORMIN (GLUCOPHAGE) 500 MG tablet Take 1 tablet (500 mg total) by mouth 2 (two) times daily with a meal. 60 tablet 11   metoprolol tartrate (LOPRESSOR) 50 MG tablet Take 50 mg by mouth 2 (two) times daily.     montelukast (SINGULAIR) 10 MG tablet Take 10 mg by mouth at bedtime.     nitroGLYCERIN (NITROSTAT) 0.4 MG SL tablet Place 0.4 mg under the tongue every 5 (five) minutes as needed for chest pain.     ondansetron (ZOFRAN) 4 MG tablet Take 4 mg by mouth every 8 (eight) hours as needed  for nausea or vomiting.     oxybutynin (DITROPAN) 5 MG tablet Take 2.5 mg by mouth 2 (two) times daily.     OXYGEN Inhale 2 L into the lungs daily as needed (shortness of breath).     pantoprazole (PROTONIX) 40 MG tablet Take 1 tablet (40 mg total) by mouth 2 (two) times daily before a meal. 60 tablet 3   scopolamine (TRANSDERM-SCOP) 1 MG/3DAYS Place 1 patch onto the skin every 3 (three) days.     tamsulosin (FLOMAX) 0.4 MG CAPS capsule Take 1 capsule (0.4 mg total) by mouth daily after supper. 30 capsule    traMADol (ULTRAM) 50 MG tablet Take 50 mg by mouth every 6 (six) hours as needed (pain).     No current facility-administered medications on file prior to visit.    Allergies:  No Known Allergies    OBJECTIVE:  Physical Exam  Vitals:   12/02/21 1109  BP: 127/69  Pulse: 78     There is no height or weight on file to calculate BMI. No results found.  General: well developed, well nourished, pleasant middle-age Caucasian male, seated, in no evident distress Head: head normocephalic and atraumatic.   Neck: supple with no carotid or supraclavicular bruits Cardiovascular: regular rate and rhythm, no murmurs Musculoskeletal: no deformity Skin:  no rash/petichiae Vascular:  Normal pulses all extremities   Neurologic Exam Mental Status: Awake and fully alert. Severe expressive aphasia/mute.  Unable to appreciate receptive aphasia - able to follow commands without difficulty.  Nods yes/no appropriately. Mood and affect appropriate.  Cranial Nerves: Pupils equal, briskly reactive to light. Extraocular movements full without nystagmus. Visual fields full to confrontation. Hearing intact. Facial sensation intact.  Right lower facial weakness.  Tongue, palate moves normally and symmetrically.  Motor: Normal strength, bulk and tone left upper and lower extremity.  Dense right  hemiplegia although able to slight move right leg off from bed for short duration Sensory.: able to sense light  touch RLE although lighter compared to LLE. Unable to feel light touch RUE but is able to sense nail bed pressure Coordination: Rapid alternating movements normal on left side. Finger-to-nose and heel-to-shin performed accurately on left side. Gait and Station: Deferred Reflexes: 1+ and symmetric. Toes downgoing.         ASSESSMENT: Brandon Daniel is a 71 y.o. year old male with left MCA/ACA territory stroke secondary to high-grade proximal left carotid stenosis on 12/03/2020 after presenting with right facial droop and right-sided weakness. Vascular risk factors include HTN, HLD, right ICA stenosis s/p stent 09/2021, L ICA occlusion, new dx of DM during admission.  Hospital course complicated by NSTEMI, acute RLE DVT (resolved, completed 6 mo AC therapy), acute respiratory failure with influenza pneumonia and left pleural effusion and urinary retention.      PLAN:  L MCA/ACA stroke :  Significant residual deficits of right hemiplegia, dysphagia and nonverbal with severe expressive aphasia.  Interestingly, he is able to slightly move RLE after stenting procedure.  Unfortunately, as he is 1 year post stroke, low suspicion that he will gain any significant recovery.  Plans on PT evaluation at SNF, currently working with OT.   Continue atorvastatin and Zetia for secondary stroke prevention Currently on Plavix and Eliquis s/p R ICA stenting (see below) Discussed secondary stroke prevention measures and importance of close PCP follow up for aggressive stroke risk factor management including BP goal<130/90, HLD with LDL goal<70 and DM with A1c.<7 .  Reports routine lab work by facility - unable to view via epic I have gone over the pathophysiology of stroke, warning signs and symptoms, risk factors and their management in some detail with instructions to go to the closest emergency room for symptoms of concern. Bilateral carotid stenosis:  Carotid ultrasound 08/2021: Right ICA 40 to 59% stenosis, left  ICA total occlusion diagnostic cerebral angio 09/09/2021 right ICA 70% stenosis, left ICA occlusion S/p angioplasty and stenting of distal cervical right ICA 10/14/2021 by Dr. Salvadore Dome Macedo Rodriguez Advise follow-up with IR 3 months postprocedure as advised post stenting Placed on Plavix and Eliquis postprocedure - advised use of both Plavix and Eliquis not indicated from a stroke standpoint, was on aspirin alone prior to procedure- advised family to further discuss ongoing need of both Plavix and Eliquis with IR at f/u visit     Overall stable from stroke standpoint without further recommendations and risk factors are managed by SNF. He may follow up PRN, as usual for our patients who are strictly being followed for stroke. If any new neurological issues should arise, request PCP place referral for evaluation by one of our neurologists. Thank you.      I spent 32 minutes of face-to-face and non-face-to-face time with patient and sister.  This included previsit chart review, lab review, study review, electronic health record documentation, patient and sister education regarding prior stroke with residual deficits, secondary stroke prevention measures and importance of managing stroke risk factors, post IR procedure, and answered all other questions to patient and sisters satisfaction  Ihor AustinJessica McCue, [facility] LPGNP-BC  [facility] [address] [place], [place] [id number]  Phone [phone] Fax [phone] Note: This document was prepared with digital dictation and possible smart phrase technology. Any transcriptional errors that result from this process are unintentional.

## 2021-12-21 ENCOUNTER — Encounter (HOSPITAL_COMMUNITY): Payer: Self-pay

## 2021-12-21 ENCOUNTER — Emergency Department (HOSPITAL_COMMUNITY)
Admission: EM | Admit: 2021-12-21 | Discharge: 2021-12-22 | Disposition: A | Discharge status: Skilled Nursing Facility | Payer: Medicare Other | Attending: Emergency Medicine | Admitting: Emergency Medicine

## 2021-12-21 ENCOUNTER — Other Ambulatory Visit: Payer: Self-pay

## 2021-12-21 ENCOUNTER — Emergency Department (HOSPITAL_COMMUNITY): Payer: Medicare Other

## 2021-12-21 DIAGNOSIS — W06XXXA Fall from bed, initial encounter: Secondary | ICD-10-CM | POA: Diagnosis not present

## 2021-12-21 DIAGNOSIS — S0101XA Laceration without foreign body of scalp, initial encounter: Secondary | ICD-10-CM | POA: Diagnosis not present

## 2021-12-21 DIAGNOSIS — J45909 Unspecified asthma, uncomplicated: Secondary | ICD-10-CM | POA: Insufficient documentation

## 2021-12-21 DIAGNOSIS — I10 Essential (primary) hypertension: Secondary | ICD-10-CM | POA: Diagnosis not present

## 2021-12-21 DIAGNOSIS — Z7901 Long term (current) use of anticoagulants: Secondary | ICD-10-CM | POA: Diagnosis not present

## 2021-12-21 DIAGNOSIS — I2510 Atherosclerotic heart disease of native coronary artery without angina pectoris: Secondary | ICD-10-CM | POA: Insufficient documentation

## 2021-12-21 DIAGNOSIS — Z79899 Other long term (current) drug therapy: Secondary | ICD-10-CM | POA: Diagnosis not present

## 2021-12-21 DIAGNOSIS — W19XXXA Unspecified fall, initial encounter: Secondary | ICD-10-CM

## 2021-12-21 DIAGNOSIS — Y92129 Unspecified place in nursing home as the place of occurrence of the external cause: Secondary | ICD-10-CM | POA: Diagnosis not present

## 2021-12-21 DIAGNOSIS — S0990XA Unspecified injury of head, initial encounter: Secondary | ICD-10-CM | POA: Diagnosis present

## 2021-12-21 NOTE — ED Notes (Signed)
..  Trauma Response Nurse Documentation   Brandon Daniel is a 71 y.o. male arriving to [facility] ED via [facility]  On clopidogrel 75 mg daily and Eliquis (apixaban) daily. Trauma was activated as a Level 2 by charge nurse based on the following trauma criteria Elderly patients > 65 with head trauma on anti-coagulation (excluding ASA). Trauma team at the bedside on patient arrival. Patient cleared for CT by Dr. Clayborne Dana. Patient to CT with team.  History   Past Medical History:  Diagnosis Date   Asthma-COPD overlap syndrome (HCC)    CAD (coronary artery disease)    COPD (chronic obstructive pulmonary disease) (HCC)    HLD (hyperlipidemia)    HTN (hypertension)    Lumbar disc herniation with radiculopathy    Lumbar stenosis    Osteoporosis    Stroke [facility])      Past Surgical History:  Procedure Laterality Date   CATARACT EXTRACTION, BILATERAL  2021   CORONARY ARTERY BYPASS GRAFT  2005   INGUINAL HERNIA REPAIR  1998   IR ANGIO EXTRACRAN SEL COM CAROTID INNOMINATE UNI BILAT MOD SED  09/09/2021   IR ANGIO INTRA EXTRACRAN SEL COM CAROTID INNOMINATE UNI R MOD SED  10/15/2021   IR ANGIO VERTEBRAL SEL SUBCLAVIAN INNOMINATE UNI R MOD SED  09/09/2021   IR ANGIO VERTEBRAL SEL VERTEBRAL UNI L MOD SED  09/09/2021   IR INTRAVSC STENT CERV CAROTID W/EMB-PROT MOD SED INCL ANGIO  10/14/2021   IR US GUIDE VASC ACCESS RIGHT  09/09/2021   IR US GUIDE VASC ACCESS RIGHT  10/14/2021   LEFT HEART CATH AND CORS/GRAFTS ANGIOGRAPHY N/A 12/08/2020   Procedure: LEFT HEART CATH AND CORS/GRAFTS ANGIOGRAPHY;  Surgeon: Orpah Cobb, MD;  Location: [facility] INVASIVE CV LAB;  Service: Cardiovascular;  Laterality: N/A;   LUMBAR DISC SURGERY  2019   RADIOLOGY WITH ANESTHESIA N/A 10/14/2021   Procedure: IR WITH ANESTHESIA STENT PLACEMENT;  Surgeon: Baldemar Lenis, MD;  Location: [facility] OR;  Service: Radiology;  Laterality: N/A;       Initial Focused Assessment (If applicable, or please see trauma documentation): See  Trauma charting  CT's Completed:   CT Head and CT C-Spine   Interventions:  See narrative   Plan for disposition:  Discharge home   Consults completed:  Marland Kitchen  Event Summary: Pt arrived via [facility] from [facility], per EMS pt had unwitnessed fall from bed just after family left after visiting. 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 No reports of LOC. Small abrasion to L side of forehead, superficial lac to back of head.  VSS, labs drawn, foley in place from facility.  PCXR done, pt transported to CT via stretcher on CCM. Pt tolerated well.  Dermabond placed at bedside for provider.   :   Bedside handoff with ED RN Brandon Daniel.    Brandon Daniel  Trauma Response RN  Please call TRN at [phone] for further assistance.

## 2021-12-22 LAB — CBC WITH DIFFERENTIAL/PLATELET
Abs Immature Granulocytes: 0.03 10*3/uL (ref 0.00–0.07)
Basophils Absolute: 0 10*3/uL (ref 0.0–0.1)
Basophils Relative: 1 %
Eosinophils Absolute: 0.1 10*3/uL (ref 0.0–0.5)
Eosinophils Relative: 2 %
HCT: 38.1 % — ABNORMAL LOW (ref 39.0–52.0)
Hemoglobin: 12.1 g/dL — ABNORMAL LOW (ref 13.0–17.0)
Immature Granulocytes: 1 %
Lymphocytes Relative: 20 %
Lymphs Abs: 1.1 10*3/uL (ref 0.7–4.0)
MCH: 28.5 pg (ref 26.0–34.0)
MCHC: 31.8 g/dL (ref 30.0–36.0)
MCV: 89.6 fL (ref 80.0–100.0)
Monocytes Absolute: 0.5 10*3/uL (ref 0.1–1.0)
Monocytes Relative: 9 %
Neutro Abs: 3.9 10*3/uL (ref 1.7–7.7)
Neutrophils Relative %: 67 %
Platelets: 230 10*3/uL (ref 150–400)
RBC: 4.25 MIL/uL (ref 4.22–5.81)
RDW: 13.8 % (ref 11.5–15.5)
WBC: 5.7 10*3/uL (ref 4.0–10.5)
nRBC: 0 % (ref 0.0–0.2)

## 2021-12-22 LAB — BASIC METABOLIC PANEL
Anion gap: 10 (ref 5–15)
BUN: 17 mg/dL (ref 8–23)
CO2: 28 mmol/L (ref 22–32)
Calcium: 9 mg/dL (ref 8.9–10.3)
Chloride: 103 mmol/L (ref 98–111)
Creatinine, Ser: 0.82 mg/dL (ref 0.61–1.24)
GFR, Estimated: >60 mL/min (ref >60)
Glucose, Bld: 105 mg/dL — ABNORMAL HIGH (ref 70–99)
Potassium: 3.4 mmol/L — ABNORMAL LOW (ref 3.5–5.1)
Sodium: 141 mmol/L (ref 135–145)

## 2021-12-22 LAB — PROTIME-INR
INR: 1.3 — ABNORMAL HIGH (ref 0.8–1.2)
Prothrombin Time: 16.1 seconds — ABNORMAL HIGH (ref 11.4–15.2)

## 2021-12-22 NOTE — ED Provider Notes (Addendum)
[facility] Provider Note   CSN: [id number] Arrival date & time:        History  Chief Complaint  Patient presents with   Lytle Michaels    Brandon Daniel is a 71 y.o. male who presents from [facility] after falling out of his bed anticoagulation with both Eliquis and Plavix.  Patient's family had recently visited the patient and left the facility shortly prior.  Patient is nonverbal at baseline but ANO with yes and no questions x3.  Per facility when asked if patient was trying to get out of his bed as he would like to go home with his family he nodded yes.  History of CVA with flaccid right-sided hemiparesis.  Patient denies LOC and per facility fall was witnessed and there was no LOC at that time.  He does have small laceration to the back of his head which was reportedly bleeding profusely at the facility but denies any pain at this time.  I personally reviewed his medical records previous history of CAD, hypertension, hyperlipidemia, DVT, and asthma.  HPI     Home Medications Prior to Admission medications   Medication Sig Start Date End Date Taking? Authorizing Provider  alendronate (FOSAMAX) 70 MG tablet Take 70 mg by mouth once a week. Take with a full glass of water on an empty stomach.    [provider]  apixaban (ELIQUIS) 5 MG TABS tablet Take 1 tablet (5 mg total) by mouth 2 (two) times daily. 10/15/21   Docia Barrier, PA  arformoterol (BROVANA) 15 MCG/2ML NEBU Take 2 mLs (15 mcg total) by nebulization 2 (two) times daily. 01/07/21   Mercy Riding, MD  atorvastatin (LIPITOR) 80 MG tablet Take 80 mg by mouth daily at 6 PM.    [provider]  budesonide (PULMICORT) 0.5 MG/2ML nebulizer solution Take 2 mLs (0.5 mg total) by nebulization 2 (two) times daily. 01/07/21   Mercy Riding, MD  carboxymethylcellulose (REFRESH PLUS) 0.5 % SOLN Place 1 drop into both eyes daily.    [provider]  Cholecalciferol  (VITAMIN D) 50 MCG (2000 UT) tablet Take 2,000 Units by mouth daily.    [provider]  clopidogrel (PLAVIX) 75 MG tablet Take 75 mg by mouth daily.    [provider]  docusate sodium (COLACE) 100 MG capsule Take 1 capsule (100 mg total) by mouth daily. 01/08/21   Mercy Riding, MD  ezetimibe (ZETIA) 10 MG tablet Take 10 mg by mouth daily.    [provider]  ferrous sulfate 325 (65 FE) MG tablet Take 1 tablet (325 mg total) by mouth 2 (two) times daily with a meal. 01/07/21   Mercy Riding, MD  FLUoxetine (PROZAC) 40 MG capsule Take 40 mg by mouth daily.    [provider]  gabapentin (NEURONTIN) 300 MG capsule Take 1 capsule (300 mg total) by mouth 2 (two) times daily. 01/07/21   Mercy Riding, MD  [employer] [facility] [place]) OINT Apply 1 application. topically See admin instructions. Apply to buttocks two times per day and after incontinent episodes    [provider]  meclizine (ANTIVERT) 25 MG tablet Take 25 mg by mouth See admin instructions. Take 25 mg 1 hour prior to PT/OT or transfer    [provider]  metFORMIN (GLUCOPHAGE) 500 MG tablet Take 1 tablet (500 mg total) by mouth 2 (two) times daily with a meal. 01/07/21 01/07/22  Mercy Riding, MD  metoprolol tartrate (LOPRESSOR) 50 MG tablet Take 50 mg by mouth 2 (two) times daily. 03/16/21   [provider]  montelukast (SINGULAIR) 10 MG tablet Take 10 mg by mouth at bedtime.    [provider]  nitroGLYCERIN (NITROSTAT) 0.4 MG SL tablet Place 0.4 mg under the tongue every 5 (five) minutes as needed for chest pain.    [provider]  ondansetron (ZOFRAN) 4 MG tablet Take 4 mg by mouth every 8 (eight) hours as needed for nausea or vomiting.    [provider]  oxybutynin (DITROPAN) 5 MG tablet Take 2.5 mg by mouth 2 (two) times daily.    [provider]  OXYGEN Inhale 2 L into the lungs daily as needed (shortness of breath).    [provider]  pantoprazole (PROTONIX) 40 MG tablet Take 1 tablet (40 mg total) by mouth 2 (two) times daily before a meal. 05/26/21   Zehr, Janett Billow D, PA-C  scopolamine (TRANSDERM-SCOP) 1 MG/3DAYS Place 1 patch onto the skin every 3 (three) days.    [provider]  tamsulosin (FLOMAX) 0.4 MG CAPS capsule Take 1 capsule (0.4 mg total) by mouth daily after supper. 01/07/21   Mercy Riding, MD  traMADol (ULTRAM) 50 MG tablet Take 50 mg by mouth every 6 (six) hours as needed (pain).    [provider]      Allergies    Patient has no known allergies.    Review of Systems   Review of Systems  Unable to perform ROS: Patient nonverbal    Physical Exam Updated Vital Signs BP 118/63   Pulse 67   Temp 98.3 F (36.8 C)   Resp 19   Ht 6' (1.829 m)   Wt 71.7 kg   SpO2 95%   BMI 21.44 kg/m  Physical Exam Vitals and nursing note reviewed.  Constitutional:      Appearance: He is not ill-appearing or toxic-appearing.  HENT:     Head: Normocephalic and atraumatic.      Nose: Nose normal.     Mouth/Throat:     Mouth: Mucous membranes are moist.     Pharynx: Oropharynx is clear. Uvula midline. No oropharyngeal exudate or posterior oropharyngeal erythema.     Tonsils: No tonsillar exudate.  Eyes:     General: Lids are normal. Vision grossly intact.        Right eye: No discharge.        Left eye: No discharge.     Extraocular Movements: Extraocular movements intact.     Conjunctiva/sclera: Conjunctivae normal.     Pupils: Pupils are equal, round, and reactive to light.  Neck:     Trachea: Trachea and phonation normal.  Cardiovascular:     Rate and Rhythm: Normal rate and regular rhythm.     Pulses: Normal pulses.     Heart sounds: Normal heart sounds. No murmur heard. Pulmonary:     Effort: Pulmonary effort is normal. No tachypnea, bradypnea, accessory muscle usage, prolonged expiration or respiratory distress.     Breath sounds: Examination of the left-lower  field reveals rales. Rales present. No wheezing.  Chest:     Chest wall: No mass, lacerations, deformity, swelling, tenderness, crepitus or edema.  Abdominal:     General: Bowel sounds are normal. There is no distension.     Palpations: Abdomen is soft.     Tenderness: There is no abdominal tenderness. There is no guarding or rebound.  Genitourinary:    Comments:  Foley catheter in place Musculoskeletal:        General: No deformity.     Cervical back: Normal range of motion and neck supple.     Right lower leg: No edema.     Left lower leg: No edema.     Comments: No deformities of the upper extremities, redness, bruising.  Lymphadenopathy:     Cervical: No cervical adenopathy.  Skin:    General: Skin is warm and dry.     Capillary Refill: Capillary refill takes less than 2 seconds.     Findings: Wound present.  Neurological:     General: No focal deficit present.     Mental Status: He is alert and oriented to person, place, and time. Mental status is at baseline.  Psychiatric:        Mood and Affect: Mood normal.     ED Results / Procedures / Treatments   Labs (all labs ordered are listed, but only abnormal results are displayed) Labs Reviewed  CBC WITH DIFFERENTIAL/PLATELET - Abnormal; Notable for the following components:      Result Value   Hemoglobin 12.1 (*)    HCT 38.1 (*)    All other components within normal limits  BASIC METABOLIC PANEL - Abnormal; Notable for the following components:   Potassium 3.4 (*)    Glucose, Bld 105 (*)    All other components within normal limits  PROTIME-INR - Abnormal; Notable for the following components:   Prothrombin Time 16.1 (*)    INR 1.3 (*)    All other components within normal limits    EKG None  Radiology CT Head Wo Contrast  Result Date: 12/22/2021 CLINICAL DATA:  Level 2 fall on blood thinners. EXAM: CT HEAD WITHOUT CONTRAST CT CERVICAL SPINE WITHOUT CONTRAST TECHNIQUE: Multidetector CT imaging of the head and  cervical spine was performed following the standard protocol without intravenous contrast. Multiplanar CT image reconstructions of the cervical spine were also generated. RADIATION DOSE REDUCTION: This exam was performed according to the departmental dose-optimization program which includes automated exposure control, adjustment of the mA and/or kV according to patient size and/or use of iterative reconstruction technique. COMPARISON:  MRI brain 12/05/2020, head CT no contrast 12/04/2020, CTA head and neck and reconstructions 12/03/2020 FINDINGS: CT HEAD FINDINGS Brain: A large left MCA/ACA territory infarct has evolved to a chronic appearance since the previous study with scattered dystrophic calcifications within the encephalomalacia. The infarct involves almost all of the left frontal lobe, extending into the anterior reaches of the parietal lobe and the superior aspect of the of the left temporal lobe, and includes the gangliocapsular region and left cerebral peduncle with some volume loss also noted in the left thalamus. There is ex vacuo expansion of the left lateral ventricle associated with minimal volume loss-related shift of the midline to the left. No focal asymmetry is seen concerning for an acute infarct, hemorrhage or mass, but the old infarct and gliosis limit assessment for isolated white matter ischemia in the left hemisphere. On the right there is mild atrophy and small-vessel disease, slight atrophic ventriculomegaly. There is mild cerebellar atrophy. Basal cisterns are clear. Vascular: There patchy calcifications in the siphons, distal vertebral arteries. No hyperdense central vessel is seen. Skull: No fracture or focal lesion is evident no visible scalp hematoma. Sinuses/Orbits: There is patchy opacification of the ethmoid air cells, moderate membrane thickening in the left maxillary sinus and again noted trace intermediate density fluid in the dominant left locule of the sphenoid  sinus. Old  lens extractions. Other: No mastoid effusion is seen. CT CERVICAL SPINE FINDINGS Alignment: There is a 3 mm grade 1 degenerative anterolisthesis at C4-5 and a 2 mm grade 1 degenerative anterolisthesis at C3-4 with straightened and minimally reversed cervical lordosis. There is no new, further or worsening alignment abnormality. Listhesis at both of the above levels was noted previously and unchanged. Bone-on-bone joint space loss and moderate osteophytosis is also again noted of the anterior atlantodental joint. Skull base and vertebrae: There is osteopenia without evidence of fractures or focal bone lesions. Soft tissues and spinal canal: No prevertebral fluid or swelling. No visible canal hematoma. There is heavy calcific disease in the carotid bulbs, proximal cervical ICAs with interval stenting of the right carotid bulb and proximal cervical ICA up to the level of C2. Severe calcification with occlusion or near-occlusion is likely still present in the proximal left cervical ICA. Disc levels: The discs are normal in heights at C2-3, C4-5 and C7-T1. There is moderate disc space loss with bidirectional osteophytes at C3-4 C5-6 and C6-7 with bidirectional osteophytes without significant disc space loss at C4-5. Posterior disc osteophyte complexes are present at all 4 of these levels with variable encroachment on the thecal sac. Thecal sac encroachment is greatest at C6-7 where there is 5.6 mm AP thecal sac stenosis and mild-to-moderate spondylotic cord compression with additional mild spondylotic cord compression at C3-4. Uncinate and facet spurs some levels with asymmetry are noted beginning at C2-3. There is degenerative foraminal stenosis which is moderate to severe on the right and moderate on the left at C3-4, severe on the left and mild on the right C4-5, moderate on the right and mild on the left C5-6, and severe on the right at C6-7. Upper chest: Lung apices are emphysematous but clear. Other: None.  IMPRESSION: 1. No acute intracranial CT findings, but a large chronic left MCA/ACA infarct and underlying gliosis limit assessment for white matter ischemia on the left. Follow-up with MRI if there is concern for occult infarct. There is no depressed skull fracture. 2. Sinus disease. 3. Osteopenia and degenerative change without evidence of cervical fractures or traumatic listhesis. 4. Multilevel degenerative foraminal stenosis, and 2 levels with spondylotic cord compression greatest at C6-7. 5. Interval stenting of the right carotid bulb and proximal cervical ICA. 6. Redemonstrated severe calcification left carotid bulb and proximal cervical ICA with likely occlusion or near-occlusion. 7. Emphysema. Electronically Signed   By: Almira Bar M.D.   On: 12/22/2021 00:41   CT Cervical Spine Wo Contrast  Result Date: 12/22/2021 CLINICAL DATA:  Level 2 fall on blood thinners. EXAM: CT HEAD WITHOUT CONTRAST CT CERVICAL SPINE WITHOUT CONTRAST TECHNIQUE: Multidetector CT imaging of the head and cervical spine was performed following the standard protocol without intravenous contrast. Multiplanar CT image reconstructions of the cervical spine were also generated. RADIATION DOSE REDUCTION: This exam was performed according to the departmental dose-optimization program which includes automated exposure control, adjustment of the mA and/or kV according to patient size and/or use of iterative reconstruction technique. COMPARISON:  MRI brain 12/05/2020, head CT no contrast 12/04/2020, CTA head and neck and reconstructions 12/03/2020 FINDINGS: CT HEAD FINDINGS Brain: A large left MCA/ACA territory infarct has evolved to a chronic appearance since the previous study with scattered dystrophic calcifications within the encephalomalacia. The infarct involves almost all of the left frontal lobe, extending into the anterior reaches of the parietal lobe and the superior aspect of the of the left temporal lobe, and includes  the  gangliocapsular region and left cerebral peduncle with some volume loss also noted in the left thalamus. There is ex vacuo expansion of the left lateral ventricle associated with minimal volume loss-related shift of the midline to the left. No focal asymmetry is seen concerning for an acute infarct, hemorrhage or mass, but the old infarct and gliosis limit assessment for isolated white matter ischemia in the left hemisphere. On the right there is mild atrophy and small-vessel disease, slight atrophic ventriculomegaly. There is mild cerebellar atrophy. Basal cisterns are clear. Vascular: There patchy calcifications in the siphons, distal vertebral arteries. No hyperdense central vessel is seen. Skull: No fracture or focal lesion is evident no visible scalp hematoma. Sinuses/Orbits: There is patchy opacification of the ethmoid air cells, moderate membrane thickening in the left maxillary sinus and again noted trace intermediate density fluid in the dominant left locule of the sphenoid sinus. Old lens extractions. Other: No mastoid effusion is seen. CT CERVICAL SPINE FINDINGS Alignment: There is a 3 mm grade 1 degenerative anterolisthesis at C4-5 and a 2 mm grade 1 degenerative anterolisthesis at C3-4 with straightened and minimally reversed cervical lordosis. There is no new, further or worsening alignment abnormality. Listhesis at both of the above levels was noted previously and unchanged. Bone-on-bone joint space loss and moderate osteophytosis is also again noted of the anterior atlantodental joint. Skull base and vertebrae: There is osteopenia without evidence of fractures or focal bone lesions. Soft tissues and spinal canal: No prevertebral fluid or swelling. No visible canal hematoma. There is heavy calcific disease in the carotid bulbs, proximal cervical ICAs with interval stenting of the right carotid bulb and proximal cervical ICA up to the level of C2. Severe calcification with occlusion or near-occlusion  is likely still present in the proximal left cervical ICA. Disc levels: The discs are normal in heights at C2-3, C4-5 and C7-T1. There is moderate disc space loss with bidirectional osteophytes at C3-4 C5-6 and C6-7 with bidirectional osteophytes without significant disc space loss at C4-5. Posterior disc osteophyte complexes are present at all 4 of these levels with variable encroachment on the thecal sac. Thecal sac encroachment is greatest at C6-7 where there is 5.6 mm AP thecal sac stenosis and mild-to-moderate spondylotic cord compression with additional mild spondylotic cord compression at C3-4. Uncinate and facet spurs some levels with asymmetry are noted beginning at C2-3. There is degenerative foraminal stenosis which is moderate to severe on the right and moderate on the left at C3-4, severe on the left and mild on the right C4-5, moderate on the right and mild on the left C5-6, and severe on the right at C6-7. Upper chest: Lung apices are emphysematous but clear. Other: None. IMPRESSION: 1. No acute intracranial CT findings, but a large chronic left MCA/ACA infarct and underlying gliosis limit assessment for white matter ischemia on the left. Follow-up with MRI if there is concern for occult infarct. There is no depressed skull fracture. 2. Sinus disease. 3. Osteopenia and degenerative change without evidence of cervical fractures or traumatic listhesis. 4. Multilevel degenerative foraminal stenosis, and 2 levels with spondylotic cord compression greatest at C6-7. 5. Interval stenting of the right carotid bulb and proximal cervical ICA. 6. Redemonstrated severe calcification left carotid bulb and proximal cervical ICA with likely occlusion or near-occlusion. 7. Emphysema. Electronically Signed   By: Almira Bar M.D.   On: 12/22/2021 00:41   DG Chest Portable 1 View  Result Date: 12/22/2021 CLINICAL DATA:  Fall EXAM: PORTABLE CHEST 1  VIEW COMPARISON:  12/27/2020 FINDINGS: Prior CABG. Heart and  mediastinal contours are within normal limits. No focal opacities or effusions. No acute bony abnormality. Aortic atherosclerosis. IMPRESSION: No active disease. Electronically Signed   By: Rolm Baptise M.D.   On: 12/22/2021 00:08    Procedures Procedures    Medications Ordered in ED Medications - No data to display  ED Course/ Medical Decision Making/ A&P                           Medical Decision Making 71 year old male presents after mechanical fall at his facility this evening on thinners, no LOC.  Mildly tachypneic on intake, vitals otherwise normal.  Cardiopulmonary abdominal exams are benign.  No deformities of the chest, abdomen, pelvis, extremities.  Small frontal hematoma and 1 cm posterior laceration to the scalp, hemostatic at this time.  Amount and/or Complexity of Data Reviewed Labs: ordered.    Details: Mild anemia with hemoglobin of 12 near patient's baseline.  BMP with mild hypokalemia of 3.4.  INR elevated in context of anticoagulation 1.3. Radiology: ordered.    Details: CT of the head and C-spine visualized by his provider without acute intracranial abnormality or acute traumatic injury in the c-spine.  Chest x-ray negative for acute cardiopulmonary disease.    Patient reevaluated and continues to be pain-free, seems pleased with the idea of going home dyspnea thumbs up, continues to answer yes and no questions appropriately.  CT cervical spine does show 2 levels of foraminal stenosis at 2 levels with spondylotic cord compression at C6-C7.    I was able to contact the patient's sister Mz. Nazal via telephone and update her on the patient's condition.  She voiced understanding of his medical evaluation and disposition plan.    Brandon Daniel's sister voiced understanding of his medical evaluation and treatment plan. Each of their questions answered to their expressed satisfaction.  Return precautions were given.  Patient is well-appearing, stable, and was discharged in good  condition.  This chart was dictated using voice recognition software, Dragon. Despite the best efforts of this provider to proofread and correct errors, errors may still occur which can change documentation meaning.   Final Clinical Impression(s) / ED Diagnoses Final diagnoses:  Fall, initial encounter    Rx / DC Orders ED Discharge Orders     None         Emeline Darling, PA-C 12/22/21 0216    Linday Rhodes, Gypsy Balsam, PA-C 12/22/21 [id number]    Merrily Pew, MD 12/22/21 [phone]

## 2021-12-22 NOTE — ED Notes (Signed)
Called [facility] and they are aware pt is returning.

## 2021-12-22 NOTE — Discharge Instructions (Addendum)
Brandon Daniel was seen in the ER today for his fall. There was no bleeding in his brain or any broken bones at this time. Please have him follow up with his primary care provider and return to the ER with any new severe symptoms.

## 2021-12-22 NOTE — ED Provider Notes (Signed)
I provided a substantive portion of the care of this patient.  I personally performed the entirety of the history, exam, and medical decision making for this encounter.  Level II trauma for head injury on thinners. Likely from trying to walk and 2/2 right sided deficits from previous stroke. Hematoma to front and superficial laceration/abrasion to posterior scalp. Will get imaging and dc as appropriate.       Dijon Cosens, Barbara Cower, MD 12/22/21 [phone]

## 2021-12-22 NOTE — ED Notes (Signed)
[facility] called  

## 2022-02-22 ENCOUNTER — Other Ambulatory Visit (HOSPITAL_COMMUNITY): Payer: Self-pay

## 2022-02-22 DIAGNOSIS — R1310 Dysphagia, unspecified: Secondary | ICD-10-CM

## 2022-03-02 ENCOUNTER — Ambulatory Visit (HOSPITAL_COMMUNITY): Payer: Medicaid Other

## 2022-03-02 ENCOUNTER — Ambulatory Visit (HOSPITAL_COMMUNITY): Admission: RE | Admit: 2022-03-02 | Payer: Medicaid Other | Source: Ambulatory Visit

## 2022-03-02 ENCOUNTER — Encounter (HOSPITAL_COMMUNITY): Payer: Self-pay

## 2022-03-10 ENCOUNTER — Ambulatory Visit (HOSPITAL_COMMUNITY)
Admission: RE | Admit: 2022-03-10 | Discharge: 2022-03-10 | Disposition: A | Discharge status: Home / Self Care | Payer: Medicare Other | Source: Ambulatory Visit | Attending: Internal Medicine | Admitting: Internal Medicine

## 2022-03-10 ENCOUNTER — Ambulatory Visit (HOSPITAL_COMMUNITY)
Admission: RE | Admit: 2022-03-10 | Discharge: 2022-03-10 | Disposition: A | Discharge status: Home / Self Care | Payer: Medicare Other | Source: Ambulatory Visit

## 2022-03-10 DIAGNOSIS — R1311 Dysphagia, oral phase: Secondary | ICD-10-CM | POA: Insufficient documentation

## 2022-03-10 DIAGNOSIS — I69320 Aphasia following cerebral infarction: Secondary | ICD-10-CM | POA: Insufficient documentation

## 2022-03-10 DIAGNOSIS — I69391 Dysphagia following cerebral infarction: Secondary | ICD-10-CM | POA: Diagnosis not present

## 2022-03-10 DIAGNOSIS — R1313 Dysphagia, pharyngeal phase: Secondary | ICD-10-CM | POA: Insufficient documentation

## 2022-03-10 DIAGNOSIS — I252 Old myocardial infarction: Secondary | ICD-10-CM | POA: Insufficient documentation

## 2022-03-10 DIAGNOSIS — I10 Essential (primary) hypertension: Secondary | ICD-10-CM | POA: Diagnosis not present

## 2022-03-10 DIAGNOSIS — E785 Hyperlipidemia, unspecified: Secondary | ICD-10-CM | POA: Diagnosis not present

## 2022-03-10 DIAGNOSIS — Z993 Dependence on wheelchair: Secondary | ICD-10-CM | POA: Diagnosis not present

## 2022-03-10 DIAGNOSIS — R1310 Dysphagia, unspecified: Secondary | ICD-10-CM

## 2022-03-10 NOTE — Therapy (Signed)
Modified Barium Swallow Progress Note  Patient Details  Name: Brandon Daniel MRN: [id number] Date of Birth: 05-07-1951  Today's Date: 03/10/2022  Modified Barium Swallow completed.  Full report located under Chart Review in the Imaging Section.  Brief recommendations include the following:  Clinical Impression  Patient presenting with a mild-moderate oral and pharyngeal dysphagia as well as as a cervical esophageal dysphagia. During oral phase, patient unable to adequately form lips on cup or spoon (h/o apraxia) and so SLP would pour a tiny amount of liquids into anterior portion of mouth after which patient would then close mouth and swallow. He exhibited anterior to posterior transit delays with all consistencies and with nectar and thin liquids he exhibited premature spillage to the vallecular sinus. Piecemeal swallowing observed with all consistencies with eventual clearance of bolus from oral cavity. During pharyngeal phase of swallow, patient exhibited adequate swallow initiation delays for majority of swallows but with some instances of delay to vallecular sinus. With nectar thick and puree solids textures, patient exhibit moderate amount of vallecular residuals, mild pyriform residuals s/p initial swallow. He was not able to be cued to swallow again but when SLP used empty cup to his lips and moved as if drinking from it, patient was able to initiate another swallow. Second swallow helped clear residuals from moderate to mild in vallecular sinus. No penetration or aspiration with nectar thick or thin liquids, none with puree solids. During cervical esophageal phase of swallow, SLP observed decreased relaxation of esophagus, appearance of cervical osteophytes which did appear to be causing some narrowing of esophagus at this level. In addition, retrograde movement of barium observed with barium moving upwards in cervical esophagus. When in [facility] after having been transported via lift, patient had a  couple instance of silent hiccuping followed by emesis of large amount of barium. No wretching observed, with barium flowing out of his oral cavity. SLP recommending SNF SLP evaluate and work with patient with thin liquids and also with use of strategies for second dry swallow after each bite/sip, reflux precautions.   Swallow Evaluation Recommendations   Recommended Consults: Consider GI evaluation   SLP Diet Recommendations: Dysphagia 1 (Puree) solids;Nectar thick liquid;Thin liquid   Liquid Administration via: Cup   Medication Administration: Crushed with puree   Supervision: Full assist for feeding   Compensations: Slow rate;Small sips/bites;Minimize environmental distractions;Multiple dry swallows after each bite/sip   Postural Changes: Remain semi-upright after after feeds/meals (Comment);Seated upright at 90 degrees (sit upright 30-45 minutes after meals)           Angela Nevin, MA, CCC-SLP Speech Therapy

## 2022-03-10 NOTE — Addendum Note (Signed)
Encounter addended by: Angela Nevin, CCC-SLP on: 03/10/2022 3:17 PM  Actions taken: Order list changed, Pharmacy for encounter modified

## 2022-03-31 ENCOUNTER — Emergency Department (HOSPITAL_COMMUNITY)
Admission: EM | Admit: 2022-03-31 | Discharge: 2022-03-31 | Disposition: A | Discharge status: Home / Self Care | Payer: Medicare Other | Attending: Emergency Medicine | Admitting: Emergency Medicine

## 2022-03-31 ENCOUNTER — Other Ambulatory Visit: Payer: Self-pay

## 2022-03-31 ENCOUNTER — Encounter (HOSPITAL_COMMUNITY): Payer: Self-pay | Admitting: Emergency Medicine

## 2022-03-31 DIAGNOSIS — Z79899 Other long term (current) drug therapy: Secondary | ICD-10-CM | POA: Diagnosis not present

## 2022-03-31 DIAGNOSIS — Z978 Presence of other specified devices: Secondary | ICD-10-CM

## 2022-03-31 DIAGNOSIS — Z7901 Long term (current) use of anticoagulants: Secondary | ICD-10-CM | POA: Insufficient documentation

## 2022-03-31 DIAGNOSIS — Y732 Prosthetic and other implants, materials and accessory gastroenterology and urology devices associated with adverse incidents: Secondary | ICD-10-CM | POA: Insufficient documentation

## 2022-03-31 DIAGNOSIS — T83511A Infection and inflammatory reaction due to indwelling urethral catheter, initial encounter: Secondary | ICD-10-CM | POA: Insufficient documentation

## 2022-03-31 LAB — BASIC METABOLIC PANEL
Anion gap: 6 (ref 5–15)
BUN: 24 mg/dL — ABNORMAL HIGH (ref 8–23)
CO2: 30 mmol/L (ref 22–32)
Calcium: 9.1 mg/dL (ref 8.9–10.3)
Chloride: 105 mmol/L (ref 98–111)
Creatinine, Ser: 0.54 mg/dL — ABNORMAL LOW (ref 0.61–1.24)
GFR, Estimated: >60 mL/min (ref >60)
Glucose, Bld: 90 mg/dL (ref 70–99)
Potassium: 3.6 mmol/L (ref 3.5–5.1)
Sodium: 141 mmol/L (ref 135–145)

## 2022-03-31 LAB — URINALYSIS, ROUTINE W REFLEX MICROSCOPIC
Bacteria, UA: NONE SEEN
Glucose, UA: NEGATIVE mg/dL
Ketones, ur: NEGATIVE mg/dL
Nitrite: NEGATIVE
Protein, ur: >=300 mg/dL — AB
RBC / HPF: >50 RBC/hpf — ABNORMAL HIGH (ref 0–5)
Specific Gravity, Urine: 1.019 (ref 1.005–1.030)
WBC, UA: >50 WBC/hpf — ABNORMAL HIGH (ref 0–5)
pH: 8 (ref 5.0–8.0)

## 2022-03-31 LAB — CBC WITH DIFFERENTIAL/PLATELET
Abs Immature Granulocytes: 0.01 10*3/uL (ref 0.00–0.07)
Basophils Absolute: 0 10*3/uL (ref 0.0–0.1)
Basophils Relative: 1 %
Eosinophils Absolute: 0.1 10*3/uL (ref 0.0–0.5)
Eosinophils Relative: 2 %
HCT: 39.6 % (ref 39.0–52.0)
Hemoglobin: 12.4 g/dL — ABNORMAL LOW (ref 13.0–17.0)
Immature Granulocytes: 0 %
Lymphocytes Relative: 22 %
Lymphs Abs: 1.2 10*3/uL (ref 0.7–4.0)
MCH: 29.3 pg (ref 26.0–34.0)
MCHC: 31.3 g/dL (ref 30.0–36.0)
MCV: 93.6 fL (ref 80.0–100.0)
Monocytes Absolute: 0.5 10*3/uL (ref 0.1–1.0)
Monocytes Relative: 9 %
Neutro Abs: 3.7 10*3/uL (ref 1.7–7.7)
Neutrophils Relative %: 66 %
Platelets: 197 10*3/uL (ref 150–400)
RBC: 4.23 MIL/uL (ref 4.22–5.81)
RDW: 14.8 % (ref 11.5–15.5)
WBC: 5.6 10*3/uL (ref 4.0–10.5)
nRBC: 0 % (ref 0.0–0.2)

## 2022-03-31 MED ORDER — SODIUM CHLORIDE 0.9 % IV BOLUS
500.0000 mL | Freq: Once | INTRAVENOUS | Status: AC
  Administered 2022-03-31: 500 mL via INTRAVENOUS

## 2022-03-31 NOTE — ED Notes (Signed)
Bladder scan completed at bedside with 6 mL noted in the bladder

## 2022-03-31 NOTE — ED Notes (Signed)
Pt's brief is full and there is no output in foley bag. EDP made aware.

## 2022-03-31 NOTE — ED Triage Notes (Signed)
Pt BIB EMS from [facility]. Per EMS, staff observed urine in sheets x2. They advanced the cath with no relief. Foley catheter changed out on September 19. Pt on 2 blood thinners. Pt alert and nonverbal.  BP 130/70 P 66 spO2 97% CBG 104

## 2022-03-31 NOTE — ED Notes (Signed)
[facility] called  

## 2022-03-31 NOTE — ED Notes (Signed)
Report given to Legrand Como, nurse at facility, at this time.

## 2022-03-31 NOTE — Discharge Instructions (Signed)
Return for any problem.   Your catheter appears to be draining appropriately.  You are having some minor leakage around the tube itself.  This should improve over the next several days.  There is no evidence of an acute infection at this time.  If you develop fever, lower abdominal pain, other concerning symptoms you should seek medical attention immediately.

## 2022-03-31 NOTE — ED Notes (Signed)
Attempted to call report to facility. No answer.  

## 2022-03-31 NOTE — ED Provider Notes (Signed)
[facility] DEPT Provider Note   CSN: [id number] Arrival date & time: 03/31/22  1524     History  Chief Complaint  Patient presents with   foley issues    Brandon Daniel is a 71 y.o. male.  71 year old male with prior medical history as detailed below presents for evaluation.  Patient with chronic indwelling Foley.  Foley catheter reportedly in place for approximately 3 years.  Last Foley change was September 19.  Patient with overflow leakage around catheter per nursing report.  Patient is otherwise without complaint.  Patient denies abdominal pain, he denies fever.  The history is provided by the patient and medical records.       Home Medications Prior to Admission medications   Medication Sig Start Date End Date Taking? Authorizing Provider  alendronate (FOSAMAX) 70 MG tablet Take 70 mg by mouth once a week. Take with a full glass of water on an empty stomach.    [provider]  apixaban (ELIQUIS) 5 MG TABS tablet Take 1 tablet (5 mg total) by mouth 2 (two) times daily. 10/15/21   Docia Barrier, PA  arformoterol (BROVANA) 15 MCG/2ML NEBU Take 2 mLs (15 mcg total) by nebulization 2 (two) times daily. 01/07/21   Mercy Riding, MD  atorvastatin (LIPITOR) 80 MG tablet Take 80 mg by mouth daily at 6 PM.    [provider]  budesonide (PULMICORT) 0.5 MG/2ML nebulizer solution Take 2 mLs (0.5 mg total) by nebulization 2 (two) times daily. 01/07/21   Mercy Riding, MD  carboxymethylcellulose (REFRESH PLUS) 0.5 % SOLN Place 1 drop into both eyes daily.    [provider]  Cholecalciferol (VITAMIN D) 50 MCG (2000 UT) tablet Take 2,000 Units by mouth daily.    [provider]  clopidogrel (PLAVIX) 75 MG tablet Take 75 mg by mouth daily.    [provider]  docusate sodium (COLACE) 100 MG capsule Take 1 capsule (100 mg total) by mouth daily. 01/08/21   Mercy Riding, MD  ezetimibe (ZETIA) 10 MG tablet  Take 10 mg by mouth daily.    [provider]  ferrous sulfate 325 (65 FE) MG tablet Take 1 tablet (325 mg total) by mouth 2 (two) times daily with a meal. 01/07/21   Mercy Riding, MD  FLUoxetine (PROZAC) 40 MG capsule Take 40 mg by mouth daily.    [provider]  gabapentin (NEURONTIN) 300 MG capsule Take 1 capsule (300 mg total) by mouth 2 (two) times daily. 01/07/21   Mercy Riding, MD  Infant Care Products [facility]) OINT Apply 1 application. topically See admin instructions. Apply to buttocks two times per day and after incontinent episodes    [provider]  meclizine (ANTIVERT) 25 MG tablet Take 25 mg by mouth See admin instructions. Take 25 mg 1 hour prior to PT/OT or transfer    [provider]  metFORMIN (GLUCOPHAGE) 500 MG tablet Take 1 tablet (500 mg total) by mouth 2 (two) times daily with a meal. 01/07/21 01/07/22  Mercy Riding, MD  metoprolol tartrate (LOPRESSOR) 50 MG tablet Take 50 mg by mouth 2 (two) times daily. 03/16/21   [provider]  montelukast (SINGULAIR) 10 MG tablet Take 10 mg by mouth at bedtime.    [provider]  nitroGLYCERIN (NITROSTAT) 0.4 MG SL tablet Place 0.4 mg under the tongue every 5 (five) minutes as needed for chest pain.    [provider]  ondansetron (  ZOFRAN) 4 MG tablet Take 4 mg by mouth every 8 (eight) hours as needed for nausea or vomiting.    [provider]  oxybutynin (DITROPAN) 5 MG tablet Take 2.5 mg by mouth 2 (two) times daily.    [provider]  OXYGEN Inhale 2 L into the lungs daily as needed (shortness of breath).    [provider]  pantoprazole (PROTONIX) 40 MG tablet Take 1 tablet (40 mg total) by mouth 2 (two) times daily before a meal. 05/26/21   Zehr, Shanda Bumps D, PA-C  scopolamine (TRANSDERM-SCOP) 1 MG/3DAYS Place 1 patch onto the skin every 3 (three) days.    [provider]  tamsulosin (FLOMAX) 0.4 MG CAPS capsule Take 1 capsule (0.4  mg total) by mouth daily after supper. 01/07/21   Almon Hercules, MD  traMADol (ULTRAM) 50 MG tablet Take 50 mg by mouth every 6 (six) hours as needed (pain).    [provider]      Allergies    Patient has no known allergies.    Review of Systems   Review of Systems  All other systems reviewed and are negative.   Physical Exam Updated Vital Signs BP 137/65   Pulse (!) 57   Temp (!) 97.5 F (36.4 C)   Resp 18   SpO2 98%  Physical Exam Vitals and nursing note reviewed.  Constitutional:      General: He is not in acute distress.    Appearance: Normal appearance. He is well-developed.  HENT:     Head: Normocephalic and atraumatic.  Eyes:     Conjunctiva/sclera: Conjunctivae normal.     Pupils: Pupils are equal, round, and reactive to light.  Cardiovascular:     Rate and Rhythm: Normal rate and regular rhythm.     Heart sounds: Normal heart sounds.  Pulmonary:     Effort: Pulmonary effort is normal. No respiratory distress.     Breath sounds: Normal breath sounds.  Abdominal:     General: There is no distension.     Palpations: Abdomen is soft.     Tenderness: There is no abdominal tenderness.  Musculoskeletal:        General: No deformity. Normal range of motion.     Cervical back: Normal range of motion and neck supple.  Skin:    General: Skin is warm and dry.  Neurological:     General: No focal deficit present.     Mental Status: He is alert. Mental status is at baseline.     ED Results / Procedures / Treatments   Labs (all labs ordered are listed, but only abnormal results are displayed) Labs Reviewed  BASIC METABOLIC PANEL - Abnormal; Notable for the following components:      Result Value   BUN 24 (*)    Creatinine, Ser 0.54 (*)    All other components within normal limits  CBC WITH DIFFERENTIAL/PLATELET - Abnormal; Notable for the following components:   Hemoglobin 12.4 (*)    All other components within normal limits  URINALYSIS, ROUTINE W  REFLEX MICROSCOPIC - Abnormal; Notable for the following components:   Color, Urine STRAW (*)    APPearance TURBID (*)    Hgb urine dipstick MODERATE (*)    Bilirubin Urine SMALL (*)    Protein, ur >=300 (*)    Leukocytes,Ua MODERATE (*)    RBC / HPF >50 (*)    WBC, UA >50 (*)    All other components within normal limits  URINE CULTURE    EKG None  Radiology No results found.  Procedures Procedures    Medications Ordered in ED Medications  sodium chloride 0.9 % bolus 500 mL (0 mLs Intravenous Stopped 03/31/22 1910)    ED Course/ Medical Decision Making/ A&P                           Medical Decision Making Amount and/or Complexity of Data Reviewed Labs: ordered.    Medical Screen Complete  This patient presented to the ED with complaint of Foley catheter problem.  This complaint involves an extensive number of treatment options. The initial differential diagnosis includes, but is not limited to, Foley catheter blockage, urine UTI, metabolic abnormality, etc.  This presentation is: Acute, Chronic, Self-Limited, Previously Undiagnosed, Uncertain Prognosis, Complicated, Systemic Symptoms, and Threat to Life/Bodily Function Patient with longstanding history of chronic Foley catheter.  Patient now presents with overflow leakage around catheter.  Patient's work-up is without evidence of other acute abnormality.  Patient with longstanding Foley cath.  UA is suggestive of colonization without active infection.  No indication for antibiotics at this time.  Catheter changed with improvement in drainage and decreased leakage around catheter.  Patient is comfortable at time of discharge.  Patient's sister updated as to care of the patient here in the ED.  Importance of close follow-up is stressed.  Strict return precautions given and understood.  Additional history obtained:  Additional history obtained from [facility] External records from outside sources obtained and  reviewed including prior ED visits and prior Inpatient records.    Lab Tests:  I ordered and personally interpreted labs.  The pertinent results include: CBC, BMP, UA   Imaging Studies ordered:  I ordered imaging studies including bladder scan I independently visualized and interpreted obtained imaging which showed no urinary retention I agree with the radiologist interpretation.   Cardiac Monitoring:  The patient was maintained on a cardiac monitor.  I personally viewed and interpreted the cardiac monitor which showed an underlying rhythm of: NSR   Problem List / ED Course:  Foley catheter dysfunction   Reevaluation:  After the interventions noted above, I reevaluated the patient and found that they have: improved   Disposition:  After consideration of the diagnostic results and the patients response to treatment, I feel that the patent would benefit from close outpatient follow-up.          Final Clinical Impression(s) / ED Diagnoses Final diagnoses:  Chronic indwelling Foley catheter    Rx / DC Orders ED Discharge Orders     None         Wynetta Fines, MD 03/31/22 2102

## 2022-03-31 NOTE — ED Notes (Signed)
Rn discussed with EDP and patient that pt needs larger size due to urethral meatus stretching and clogging. He is likely urinating around currently 64 F because foley too small and drainage holes clogging easily with sediment. EDP and RN and Pt in agreement. 54 F placed. Pt tolerated well.

## 2022-03-31 NOTE — ED Notes (Signed)
Pt given water and encouraged to drink fluids °

## 2022-04-02 LAB — URINE CULTURE: Culture: 80000 — AB

## 2022-04-03 ENCOUNTER — Telehealth (HOSPITAL_BASED_OUTPATIENT_CLINIC_OR_DEPARTMENT_OTHER): Payer: Self-pay | Admitting: *Deleted

## 2022-04-03 NOTE — Telephone Encounter (Signed)
Post ED Visit - Positive Culture Follow-up  Culture report reviewed by antimicrobial stewardship pharmacist: [place] Team []  Elenor Quinones, Pharm.D. []  Heide Guile, Pharm.D., BCPS AQ-ID []  Parks Neptune, Pharm.D., BCPS []  Alycia Rossetti, Pharm.D., BCPS []  [place], Pharm.D., BCPS, AAHIVP []  Legrand Como, Pharm.D., BCPS, AAHIVP []  Salome Arnt, PharmD, BCPS []  Johnnette Gourd, PharmD, BCPS []  Hughes Better, PharmD, BCPS []  Leeroy Cha, PharmD []  Laqueta Linden, PharmD, BCPS []  Albertina Parr, PharmD  Portal Team []  Leodis Sias, PharmD [x]  Lindell Spar, PharmD []  Royetta Asal, PharmD []  Graylin Shiver, Rph []  Rema Fendt) Glennon Mac, PharmD []  Arlyn Dunning, PharmD []  Netta Cedars, PharmD []  Dia Sitter, PharmD []  Leone [place], PharmD []  Gretta Arab, PharmD []  Theodis Shove, PharmD []  Peggyann Juba, PharmD []  Reuel Boom, PharmD   Positive urine culture See previous note per Pharmacist, No further patient follow-up is required at this time.  Rosie Fate 04/03/2022, 1:15 PM

## 2022-04-03 NOTE — Progress Notes (Signed)
ED Antimicrobial Stewardship Positive Culture Follow Up   Axil Copeman is an 71 y.o. male who presented to [facility] on 03/31/2022 with a chief complaint of  Chief Complaint  Patient presents with   foley issues    Recent Results (from the past 720 hour(s))  Urine Culture     Status: Abnormal   Collection Time: 03/31/22  7:13 PM   Specimen: Urine, Catheterized  Result Value Ref Range Status   Specimen Description   Final    URINE, CATHETERIZED Performed at [facility], [place] [address] [place], [place] [address]    Special Requests   Final    NONE Performed at [facility], [place] [address], [place], [place] [address]    Culture 80,000 COLONIES/mL PROTEUS MIRABILIS (A)  Final   Report Status 04/02/2022 FINAL  Final   Organism ID, Bacteria PROTEUS MIRABILIS (A)  Final      Susceptibility   Proteus mirabilis - MIC*    AMPICILLIN <=2 SENSITIVE Sensitive     CEFAZOLIN 8 SENSITIVE Sensitive     CEFEPIME <=0.12 SENSITIVE Sensitive     CEFTRIAXONE <=0.25 SENSITIVE Sensitive     CIPROFLOXACIN >=4 RESISTANT Resistant     GENTAMICIN 8 INTERMEDIATE Intermediate     IMIPENEM 2 SENSITIVE Sensitive     NITROFURANTOIN 128 RESISTANT Resistant     TRIMETH/SULFA >=320 RESISTANT Resistant     AMPICILLIN/SULBACTAM <=2 SENSITIVE Sensitive     PIP/TAZO <=4 SENSITIVE Sensitive     * 80,000 COLONIES/mL PROTEUS MIRABILIS    Per Dr. Deanna Artis note, "UA is suggestive of colonization without active infection.  No indication for antibiotics at this time."  Reviewed with ED Provider: Mervyn Gay, PA-C   Lindell Spar M 04/03/2022, 1:03 PM Clinical Pharmacist [phone]

## 2022-05-03 ENCOUNTER — Encounter: Payer: Self-pay | Admitting: Gastroenterology

## 2022-05-03 ENCOUNTER — Ambulatory Visit (INDEPENDENT_AMBULATORY_CARE_PROVIDER_SITE_OTHER): Payer: Medicare Other | Admitting: Gastroenterology

## 2022-05-03 VITALS — BP 108/64 | HR 72 | Wt 145.0 lb

## 2022-05-03 DIAGNOSIS — R112 Nausea with vomiting, unspecified: Secondary | ICD-10-CM

## 2022-05-03 NOTE — Progress Notes (Addendum)
05/03/2022 Brandon Daniel [id number] 1950-10-20   HISTORY OF PRESENT ILLNESS: This is a 71 year old male who is here today to discuss issues with nausea and vomiting.  He is here with a staff member from the facility where he resides, but she does not know anything about his history.  Patient is nonverbal, but will answer yes or no with thumbs up and shaking his head.  I saw the patient for this exact same issue exactly 1 year ago.  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  The vomiting occurs mostly with movement.  CT of the abdomen and pelvis and GES were normal.  He is very high risk for endoscopic procedures being that he is on blood thinners, oxygen therapy, limited to no mobility, nonverbal state related to his CVA.  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  He is currently on meclizine as needed with certain movement, scopolamine patch every 3 days, Pepcid twice a day.  He had been on PPI in the past, but looks like that was discontinued.  He has Zofran as needed.  He vomited on the way over here in the transportation vehicle.  He tells me that this does not occur after eating.  He denies abdominal pain.  He denies heartburn or reflux sensation.  He has lost about 18 pounds over the last year.   Past Medical History:  Diagnosis Date   Asthma-COPD overlap syndrome    CAD (coronary artery disease)    COPD (chronic obstructive pulmonary disease) (HCC)    HLD (hyperlipidemia)    HTN (hypertension)    Lumbar disc herniation with radiculopathy    Lumbar stenosis    Osteoporosis    Stroke [facility])    Past Surgical History:  Procedure Laterality Date   CATARACT EXTRACTION, BILATERAL  2021   CORONARY ARTERY BYPASS GRAFT  2005   INGUINAL HERNIA REPAIR  1998   IR ANGIO EXTRACRAN SEL COM CAROTID INNOMINATE UNI BILAT MOD SED  09/09/2021   IR ANGIO INTRA EXTRACRAN SEL COM CAROTID INNOMINATE  UNI R MOD SED  10/15/2021   IR ANGIO VERTEBRAL SEL SUBCLAVIAN INNOMINATE UNI R MOD SED  09/09/2021   IR ANGIO VERTEBRAL SEL VERTEBRAL UNI L MOD SED  09/09/2021   IR INTRAVSC STENT CERV CAROTID W/EMB-PROT MOD SED INCL ANGIO  10/14/2021   IR US GUIDE VASC ACCESS RIGHT  09/09/2021   IR US GUIDE VASC ACCESS RIGHT  10/14/2021   LEFT HEART CATH AND CORS/GRAFTS ANGIOGRAPHY N/A 12/08/2020   Procedure: LEFT HEART CATH AND CORS/GRAFTS ANGIOGRAPHY;  Surgeon: Dixie Dials, MD;  Location: [facility];  Service: Cardiovascular;  Laterality: N/A;   LUMBAR DISC SURGERY  2019   RADIOLOGY WITH ANESTHESIA N/A 10/14/2021   Procedure: IR WITH ANESTHESIA STENT PLACEMENT;  Surgeon: Pedro Earls, MD;  Location: [facility];  Service: Radiology;  Laterality: N/A;    reports that he has quit smoking. His smoking use included cigarettes. He has a 60.00 pack-year smoking history. He has never used smokeless tobacco. He reports that he does not drink alcohol and does not use drugs. family history includes CAD in his father; Cancer in his mother; Colon cancer in an other family member; Diabetes in an other family member; Prostate cancer in an other family member. No Known Allergies    Outpatient Encounter Medications as of 05/03/2022  Medication Sig  alendronate (FOSAMAX) 70 MG tablet Take 70 mg by mouth once a week. Take with a full glass of water on an empty stomach.   apixaban (ELIQUIS) 5 MG TABS tablet Take 1 tablet (5 mg total) by mouth 2 (two) times daily.   arformoterol (BROVANA) 15 MCG/2ML NEBU Take 2 mLs (15 mcg total) by nebulization 2 (two) times daily.   atorvastatin (LIPITOR) 80 MG tablet Take 80 mg by mouth daily at 6 PM.   budesonide (PULMICORT) 0.5 MG/2ML nebulizer solution Take 2 mLs (0.5 mg total) by nebulization 2 (two) times daily.   carboxymethylcellulose (REFRESH PLUS) 0.5 % SOLN Place 1 drop into both eyes daily.   Cholecalciferol (VITAMIN D) 50 MCG (2000 UT) tablet Take 2,000 Units by  mouth daily.   clopidogrel (PLAVIX) 75 MG tablet Take 75 mg by mouth daily.   docusate sodium (COLACE) 100 MG capsule Take 1 capsule (100 mg total) by mouth daily.   ferrous sulfate 325 (65 FE) MG tablet Take 1 tablet (325 mg total) by mouth 2 (two) times daily with a meal.   FLUoxetine (PROZAC) 40 MG capsule Take 40 mg by mouth daily.   gabapentin (NEURONTIN) 300 MG capsule Take 1 capsule (300 mg total) by mouth 2 (two) times daily.   Infant Care Products [facility]) OINT Apply 1 application. topically See admin instructions. Apply to buttocks two times per day and after incontinent episodes   meclizine (ANTIVERT) 25 MG tablet Take 25 mg by mouth See admin instructions. Take 25 mg 1 hour prior to PT/OT or transfer   metoprolol tartrate (LOPRESSOR) 50 MG tablet Take 50 mg by mouth 2 (two) times daily.   montelukast (SINGULAIR) 10 MG tablet Take 10 mg by mouth at bedtime.   nitroGLYCERIN (NITROSTAT) 0.4 MG SL tablet Place 0.4 mg under the tongue every 5 (five) minutes as needed for chest pain.   ondansetron (ZOFRAN) 4 MG tablet Take 4 mg by mouth every 8 (eight) hours as needed for nausea or vomiting.   oxybutynin (DITROPAN) 5 MG tablet Take 2.5 mg by mouth 2 (two) times daily.   OXYGEN Inhale 2 L into the lungs daily as needed (shortness of breath).   scopolamine (TRANSDERM-SCOP) 1 MG/3DAYS Place 1 patch onto the skin every 3 (three) days.   tamsulosin (FLOMAX) 0.4 MG CAPS capsule Take 1 capsule (0.4 mg total) by mouth daily after supper.   traMADol (ULTRAM) 50 MG tablet Take 50 mg by mouth every 6 (six) hours as needed (pain).   [DISCONTINUED] ezetimibe (ZETIA) 10 MG tablet Take 10 mg by mouth daily.   [DISCONTINUED] pantoprazole (PROTONIX) 40 MG tablet Take 1 tablet (40 mg total) by mouth 2 (two) times daily before a meal.   famotidine (PEPCID) 40 MG/5ML suspension Take 20 mg by mouth daily.   metFORMIN (GLUCOPHAGE) 500 MG tablet Take 1 tablet (500 mg total) by mouth 2 (two) times daily with a  meal.   No facility-administered encounter medications on file as of 05/03/2022.     REVIEW OF SYSTEMS  : All other systems reviewed and negative except where noted in the History of Present Illness.   PHYSICAL EXAM: BP 108/64   Pulse 72   Wt 145 lb (65.8 kg)   BMI 19.67 kg/m  General: Chronically ill-appearing male in no acute distress Head: Normocephalic and atraumatic Eyes:  Sclerae anicteric, conjunctiva pink. Ears: Normal auditory acuity Lungs: Clear throughout to auscultation; no W/R/R. Heart: Regular rate and rhythm; no M/R/G. Abdomen: Soft, non-distended.  BS  present.  Non-tender. Musculoskeletal: Symmetrical with no gross deformities  Skin: No lesions on visible extremities Extremities: No edema  Neurological: Alert oriented x 4, grossly non-focal Psychological:  Alert and cooperative. Normal mood and affect  ASSESSMENT AND PLAN: *Nausea and vomiting: I saw the patient for this exact same issue exactly 1 year ago.  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  The vomiting occurs mostly with movement.  CT of the abdomen and pelvis and GES were normal.  He is very high risk for endoscopic procedures being that he is on blood thinners, oxygen therapy, limited to no mobility, nonverbal state related to his CVA.  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  I strongly think that the nausea and vomiting is a central neurologic source.  The best that I can offer is to continue to manage it symptomatically.  He is currently on meclizine as needed with certain movement, scopolamine patch every 3 days, Pepcid twice a day.  He had been on PPI in the past, but looks like that was discontinued.  He has Zofran as needed.  His QTc is okay so we can try to do that scheduled twice a day, morning and evening.   CC:  No ref. provider found

## 2022-05-04 NOTE — Progress Notes (Signed)
____________________________________________________________  Attending physician addendum:  Thank you for sending this case to me. I have reviewed the entire note and agree with the plan.  I reiterate my opinion stated in the 05/16/21 office note addendum.  Amada Jupiter, MD  ____________________________________________________________
